# Patient Record
Sex: Male | Born: 1945
Health system: Southern US, Community
[De-identification: ages and names within clinical notes are randomized; demographics above are authoritative.]

## PROBLEM LIST (undated history)

## (undated) DIAGNOSIS — F0390 Unspecified dementia without behavioral disturbance: Secondary | ICD-10-CM

## (undated) DIAGNOSIS — I2510 Atherosclerotic heart disease of native coronary artery without angina pectoris: Secondary | ICD-10-CM

## (undated) DIAGNOSIS — I429 Cardiomyopathy, unspecified: Secondary | ICD-10-CM

## (undated) DIAGNOSIS — K7460 Unspecified cirrhosis of liver: Secondary | ICD-10-CM

## (undated) DIAGNOSIS — K219 Gastro-esophageal reflux disease without esophagitis: Secondary | ICD-10-CM

## (undated) DIAGNOSIS — N184 Chronic kidney disease, stage 4 (severe): Secondary | ICD-10-CM

## (undated) DIAGNOSIS — N189 Chronic kidney disease, unspecified: Secondary | ICD-10-CM

## (undated) DIAGNOSIS — E785 Hyperlipidemia, unspecified: Secondary | ICD-10-CM

## (undated) DIAGNOSIS — Z720 Tobacco use: Secondary | ICD-10-CM

## (undated) HISTORY — DX: Atherosclerotic heart disease of native coronary artery without angina pectoris: I25.10

## (undated) HISTORY — DX: Unspecified cirrhosis of liver: K74.60

## (undated) HISTORY — DX: Gastro-esophageal reflux disease without esophagitis: K21.9

## (undated) HISTORY — DX: Chronic kidney disease, stage 4 (severe): N18.4

## (undated) HISTORY — DX: Unspecified dementia, unspecified severity, without behavioral disturbance, psychotic disturbance, mood disturbance, and anxiety: F03.90

## (undated) HISTORY — DX: Tobacco use: Z72.0

## (undated) HISTORY — DX: Chronic kidney disease, unspecified: N18.9

## (undated) HISTORY — DX: Cardiomyopathy, unspecified: I42.9

---

## 2001-01-15 ENCOUNTER — Inpatient Hospital Stay (HOSPITAL_COMMUNITY): Admission: EM | Admit: 2001-01-15 | Discharge: 2001-01-19 | Payer: Self-pay | Admitting: Emergency Medicine

## 2001-01-15 ENCOUNTER — Encounter: Payer: Self-pay | Admitting: Emergency Medicine

## 2006-01-22 ENCOUNTER — Ambulatory Visit: Payer: Self-pay | Admitting: Cardiology

## 2006-01-22 ENCOUNTER — Encounter: Payer: Self-pay | Admitting: Emergency Medicine

## 2006-01-22 ENCOUNTER — Inpatient Hospital Stay (HOSPITAL_COMMUNITY): Admission: AD | Admit: 2006-01-22 | Discharge: 2006-01-29 | Payer: Self-pay | Admitting: Cardiology

## 2006-01-25 ENCOUNTER — Encounter: Payer: Self-pay | Admitting: Cardiovascular Disease

## 2006-02-14 ENCOUNTER — Ambulatory Visit: Payer: Self-pay | Admitting: Cardiology

## 2006-02-22 ENCOUNTER — Ambulatory Visit: Payer: Self-pay | Admitting: Cardiology

## 2006-03-21 ENCOUNTER — Ambulatory Visit: Payer: Self-pay | Admitting: Cardiology

## 2006-04-01 ENCOUNTER — Ambulatory Visit (HOSPITAL_COMMUNITY): Admission: RE | Admit: 2006-04-01 | Discharge: 2006-04-01 | Payer: Self-pay | Admitting: Cardiology

## 2006-04-01 ENCOUNTER — Ambulatory Visit: Payer: Self-pay | Admitting: Cardiology

## 2006-08-12 ENCOUNTER — Ambulatory Visit: Payer: Self-pay | Admitting: Cardiology

## 2007-03-13 ENCOUNTER — Ambulatory Visit: Payer: Self-pay | Admitting: Cardiology

## 2007-03-13 LAB — CONVERTED CEMR LAB
ALT: 25 units/L (ref 0–53)
AST: 25 units/L (ref 0–37)
Albumin: 4.2 g/dL (ref 3.5–5.2)
Alkaline Phosphatase: 61 units/L (ref 39–117)
BUN: 54 mg/dL — ABNORMAL HIGH (ref 6–23)
Basophils Absolute: 0 10*3/uL (ref 0.0–0.1)
Basophils Relative: 0.7 % (ref 0.0–1.0)
Bilirubin, Direct: 0.1 mg/dL (ref 0.0–0.3)
CO2: 24 meq/L (ref 19–32)
Calcium: 9.5 mg/dL (ref 8.4–10.5)
Chloride: 104 meq/L (ref 96–112)
Creatinine, Ser: 2.5 mg/dL — ABNORMAL HIGH (ref 0.4–1.5)
Eosinophils Absolute: 0.7 10*3/uL — ABNORMAL HIGH (ref 0.0–0.6)
Eosinophils Relative: 9.8 % — ABNORMAL HIGH (ref 0.0–5.0)
GFR calc Af Amer: 34 mL/min
GFR calc non Af Amer: 28 mL/min
Glucose, Bld: 113 mg/dL — ABNORMAL HIGH (ref 70–99)
HCT: 32.6 % — ABNORMAL LOW (ref 39.0–52.0)
Hemoglobin: 11.2 g/dL — ABNORMAL LOW (ref 13.0–17.0)
Lymphocytes Relative: 44.4 % (ref 12.0–46.0)
MCHC: 34.5 g/dL (ref 30.0–36.0)
MCV: 88 fL (ref 78.0–100.0)
Monocytes Absolute: 0.5 10*3/uL (ref 0.2–0.7)
Monocytes Relative: 7.7 % (ref 3.0–11.0)
Neutro Abs: 2.6 10*3/uL (ref 1.4–7.7)
Neutrophils Relative %: 37.4 % — ABNORMAL LOW (ref 43.0–77.0)
Platelets: 172 10*3/uL (ref 150–400)
Potassium: 4.7 meq/L (ref 3.5–5.1)
RBC: 3.7 M/uL — ABNORMAL LOW (ref 4.22–5.81)
RDW: 13.5 % (ref 11.5–14.6)
Sodium: 137 meq/L (ref 135–145)
Total Bilirubin: 0.8 mg/dL (ref 0.3–1.2)
Total Protein: 8.1 g/dL (ref 6.0–8.3)
WBC: 6.9 10*3/uL (ref 4.5–10.5)

## 2007-03-26 ENCOUNTER — Ambulatory Visit: Payer: Self-pay | Admitting: Cardiology

## 2007-04-09 ENCOUNTER — Ambulatory Visit: Payer: Self-pay | Admitting: Cardiovascular Disease

## 2007-07-03 ENCOUNTER — Ambulatory Visit: Payer: Self-pay | Admitting: Cardiovascular Disease

## 2008-05-03 ENCOUNTER — Ambulatory Visit (HOSPITAL_COMMUNITY): Admission: RE | Admit: 2008-05-03 | Discharge: 2008-05-03 | Payer: Self-pay | Admitting: Pulmonary Disease

## 2008-11-10 ENCOUNTER — Ambulatory Visit: Payer: Self-pay | Admitting: Cardiology

## 2008-11-11 ENCOUNTER — Encounter (INDEPENDENT_AMBULATORY_CARE_PROVIDER_SITE_OTHER): Payer: Self-pay | Admitting: *Deleted

## 2008-11-11 ENCOUNTER — Encounter: Payer: Self-pay | Admitting: Cardiology

## 2008-11-11 ENCOUNTER — Ambulatory Visit (HOSPITAL_COMMUNITY): Admission: RE | Admit: 2008-11-11 | Discharge: 2008-11-11 | Payer: Self-pay | Admitting: Cardiology

## 2008-11-11 ENCOUNTER — Ambulatory Visit: Payer: Self-pay | Admitting: Cardiology

## 2008-11-11 LAB — CONVERTED CEMR LAB
ALT: 20 units/L
AST: 17 units/L
Albumin: 4 g/dL
Alkaline Phosphatase: 49 units/L
BUN: 31 mg/dL
CO2: 21 meq/L
Calcium: 9.1 mg/dL
Chloride: 103 meq/L
Cholesterol: 99 mg/dL
Creatinine, Ser: 1.68 mg/dL
Glucose, Bld: 103 mg/dL
HDL: 28 mg/dL
LDL Cholesterol: 55 mg/dL
Potassium: 4.4 meq/L
Sodium: 139 meq/L
Total Protein: 7.2 g/dL
Triglycerides: 81 mg/dL

## 2009-01-25 ENCOUNTER — Encounter (INDEPENDENT_AMBULATORY_CARE_PROVIDER_SITE_OTHER): Payer: Self-pay | Admitting: *Deleted

## 2009-03-25 ENCOUNTER — Encounter: Payer: Self-pay | Admitting: Cardiology

## 2009-07-21 ENCOUNTER — Encounter (INDEPENDENT_AMBULATORY_CARE_PROVIDER_SITE_OTHER): Payer: Self-pay

## 2009-09-14 ENCOUNTER — Encounter (INDEPENDENT_AMBULATORY_CARE_PROVIDER_SITE_OTHER): Payer: Self-pay | Admitting: *Deleted

## 2009-09-14 ENCOUNTER — Ambulatory Visit: Payer: Self-pay | Admitting: Cardiology

## 2010-04-19 ENCOUNTER — Encounter (INDEPENDENT_AMBULATORY_CARE_PROVIDER_SITE_OTHER): Payer: Self-pay | Admitting: *Deleted

## 2010-04-19 LAB — CONVERTED CEMR LAB
AST: 23 units/L
Albumin: 4.1 g/dL
Alkaline Phosphatase: 49 units/L
HDL: 35 mg/dL
Potassium: 4.9 meq/L
Sodium: 136 meq/L
Total Protein: 7.1 g/dL
Triglycerides: 66 mg/dL

## 2010-09-05 ENCOUNTER — Encounter (INDEPENDENT_AMBULATORY_CARE_PROVIDER_SITE_OTHER): Payer: Self-pay | Admitting: *Deleted

## 2010-09-07 ENCOUNTER — Ambulatory Visit: Payer: Self-pay | Admitting: Cardiology

## 2010-09-08 ENCOUNTER — Encounter (INDEPENDENT_AMBULATORY_CARE_PROVIDER_SITE_OTHER): Payer: Self-pay | Admitting: *Deleted

## 2010-10-19 NOTE — Miscellaneous (Signed)
Summary: LABS BMP,LIPIDS,LIVER,PSA,04/19/2010  Clinical Lists Changes  Observations: Added new observation of CALCIUM: 9.2 mg/dL (04/19/2010 10:45) Added new observation of ALBUMIN: 4.1 g/dL (04/19/2010 10:45) Added new observation of PROTEIN, TOT: 7.1 g/dL (04/19/2010 10:45) Added new observation of SGPT (ALT): 25 units/L (04/19/2010 10:45) Added new observation of SGOT (AST): 23 units/L (04/19/2010 10:45) Added new observation of ALK PHOS: 49 units/L (04/19/2010 10:45) Added new observation of BILI DIRECT: 0.1 mg/dL (04/19/2010 10:45) Added new observation of CREATININE: 1.76 mg/dL (04/19/2010 10:45) Added new observation of BUN: 30 mg/dL (04/19/2010 10:45) Added new observation of BG RANDOM: 83 mg/dL (04/19/2010 10:45) Added new observation of CO2 PLSM/SER: 25 meq/L (04/19/2010 10:45) Added new observation of CL SERUM: 102 meq/L (04/19/2010 10:45) Added new observation of K SERUM: 4.9 meq/L (04/19/2010 10:45) Added new observation of NA: 136 meq/L (04/19/2010 10:45) Added new observation of LDL: 49 mg/dL (04/19/2010 10:45) Added new observation of HDL: 35 mg/dL (04/19/2010 10:45) Added new observation of TRIGLYC TOT: 66 mg/dL (04/19/2010 10:45) Added new observation of CHOLESTEROL: 97 mg/dL (04/19/2010 10:45)

## 2010-10-19 NOTE — Letter (Signed)
Summary: Appointment - Missed  [facility] [place]. [address] [place] [address]   Phone: [phone]  Fax: [phone]     September 08, 2010 MRN: [id number]   [address] [place], [place]  [address]   Dear Mr. Siess,  Our records indicate you missed your appointment on    09/07/10                    with Dr.   Lattie Haw    .                                    It is very important that we reach you to reschedule this appointment. We look forward to participating in your health care needs. Please contact us at the number listed above at your earliest convenience to reschedule this appointment.     Sincerely,    Public relations account executive

## 2010-11-07 ENCOUNTER — Ambulatory Visit: Payer: Self-pay | Admitting: Cardiology

## 2010-11-30 ENCOUNTER — Ambulatory Visit (INDEPENDENT_AMBULATORY_CARE_PROVIDER_SITE_OTHER): Payer: Medicare Other | Admitting: Cardiology

## 2010-11-30 ENCOUNTER — Encounter: Payer: Self-pay | Admitting: *Deleted

## 2010-11-30 ENCOUNTER — Encounter: Payer: Self-pay | Admitting: Cardiology

## 2010-11-30 DIAGNOSIS — E782 Mixed hyperlipidemia: Secondary | ICD-10-CM

## 2010-11-30 DIAGNOSIS — I2510 Atherosclerotic heart disease of native coronary artery without angina pectoris: Secondary | ICD-10-CM

## 2010-12-02 ENCOUNTER — Encounter: Payer: Self-pay | Admitting: Cardiology

## 2010-12-02 LAB — CONVERTED CEMR LAB
ALT: 19 units/L (ref 0–53)
AST: 21 units/L (ref 0–37)
Alkaline Phosphatase: 50 units/L (ref 39–117)
Basophils Absolute: 0 10*3/uL (ref 0.0–0.1)
Basophils Relative: 1 % (ref 0–1)
Calcium: 9.2 mg/dL (ref 8.4–10.5)
Chloride: 103 meq/L (ref 96–112)
Creatinine, Ser: 1.7 mg/dL — ABNORMAL HIGH (ref 0.40–1.50)
Hemoglobin: 11.5 g/dL — ABNORMAL LOW (ref 13.0–17.0)
LDL Cholesterol: 52 mg/dL (ref 0–99)
MCHC: 33.4 g/dL (ref 30.0–36.0)
Monocytes Absolute: 0.4 10*3/uL (ref 0.1–1.0)
Neutro Abs: 2.4 10*3/uL (ref 1.7–7.7)
Neutrophils Relative %: 41 % — ABNORMAL LOW (ref 43–77)
RDW: 14.5 % (ref 11.5–15.5)
Total CHOL/HDL Ratio: 3
VLDL: 16 mg/dL (ref 0–40)

## 2010-12-05 NOTE — Letter (Signed)
Summary:  Future Lab Work Doctor, general practice at [place]. [address], [place] [address]   Phone: [phone]  Fax: [phone]     November 30, 2010 MRN: [id number]   [name] [name] [place], [name]  [address]      YOUR LAB WORK IS DUE   January 01, 2011  Please go to [facility], located across the street from [facility] on the second floor.  Hours are Monday - Friday 7am until 7:30pm         Saturday 8am until 12noon      _X_ YOUR LABWORK IS NOT FASTING --YOU MAY EAT PRIOR TO LABWORK

## 2010-12-14 NOTE — Assessment & Plan Note (Signed)
Summary: past due for f/u per pt phone call/tg  Medications Added LISINOPRIL 10 MG TABS (LISINOPRIL) Take one tablet by mouth daily      Allergies Added: NKDA  Visit Type:  Follow-up Primary Provider:  Dr. Sinda Du   History of Present Illness: [name] returns to the office as scheduled for continued assessment and treatment of ischemic cardiomyopathy.  Despite current cigarette consumption of one pack per day his performance status remains enviable.  He does lawn care and walk stairs without any dyspnea or chest discomfort.  He has had no PND, orthopnea, pedal edema or syncope.  Current Medications (verified): 1)  Daily-Vitamin  Tabs (Multiple Vitamin) .... Take 1 Tablet Daily 2)  Protonix 40 Mg Tbec (Pantoprazole Sodium) .[name].. 1 Tab Once Daily 3)  Furosemide 40 Mg Tabs (Furosemide) .... Take 1 Tablet By Mouth Once Daily 4)  Lisinopril 10 Mg Tabs (Lisinopril) .... Take One Tablet By Mouth Daily 5)  Carvedilol 12.5 Mg Tabs (Carvedilol) .... Take One Tablet By Mouth Twice A Day 6)  Aspir-Low 81 Mg Tbec (Aspirin) .... Take 1 Tab Daily 7)  Lipitor 40 Mg Tabs (Atorvastatin Calcium) .... Take 1 Tab Daily 8)  Nitrostat 0.4 Mg Subl (Nitroglycerin) .... Take As Needed For Chest Pain 9)  Tylenol 325 Mg Tabs (Acetaminophen) .... 2 Tabs Q 4hrs 10)  Robitussin Maximum Strength 15 Mg/69ml Syrp (Dextromethorphan Hbr) .... As Needed 11)  Mylanta 200-200-20 Mg/3ml Susp (Alum & Mag Hydroxide-Simeth) .... Take As Needed 12)  Sb Milk of Magnesia 400 Mg/89ml Susp (Magnesium Hydroxide) .... Take As Needed 13)  Imodium A-D 2 Mg Tabs (Loperamide Hcl) .... Take As Needed  Allergies (verified): No Known Drug Allergies  Comments:  Nurse/Medical Assistant: patient brought med list from [facility] [facility] [facility]  Past History:  PMH, FH, and Social History reviewed and updated.  Past Medical History: ASCVD: IMI in 5/07 with CTO mid cx, 60% LAD, 99% RCA->  BMS; mod. impaired LV function; EF:35-40% 2/10 Tobacco abuse-60 pack years; one pack per day Gastroesophageal reflux disease CHRONIC KIDNEY DISEASE STAGE 3-4-creatinine 2.5 in 2009; 1.7 in 10/2008 Cirrhosis with history of excessive alcohol use; continuing social use  Family History: Unavailable  Review of Systems       See history of present illness  Vital Signs:  Patient profile:   65 year old male Height:      67 inches Weight:      147 pounds BMI:     23.11 O2 Sat:      99 % on Room air Pulse rate:   72 / minute BP sitting:   109 / 62  (left arm)  Vitals Entered By: Doretha Sou, CNA (November 30, 2010 1:02 PM)  O2 Flow:  Room air  Physical Exam  General:  Thin and well developed; no acute distress:   Neck-No JVD; no carotid bruits Thorax-mild gynecomastia Lungs-clear to auscultation; mild prolongation of the expiratory phase Cardiovascular-normal PMI; normal S1 and S2; S4 present Abdomen-BS normal; soft and non-tender without masses or organomegaly:  Musculoskeletal-No deformities, no cyanosis or clubbing: Neurologic-Normal cranial nerves; symmetric strength and tone:  Skin-Warm; tinea versicolor over back; vitiligo over base of neck Extremities- no edema; distal pulses intact     Impression & Recommendations:  Problem # 1:  CHRONIC KIDNEY DISEASE STAGE III (MODERATE) (ICD-585.3) Renal function has actually improved in recent years; a repeat metabolic profile will be obtained.  Problem # 2:  ATHEROSCLEROTIC CARDIOVASCULAR DISEASE (ICD-429.2) Patient  is entirely asymptomatic.  The desirability of discontinuing tobacco use was explained to him, but he is not inclined to consider this option.  We will attempt to optimize other cardiovascular risk factors.  Lipid status was excellent when the assessed 8 months ago.  A repeat lipid profile will be obtained.  Ischemic cardiomyopathy is present without any evidence for congestive heart failure.  His dose of lisinopril  will be increased to 10 mg q.d.  Renal function will be monitored.  Other Orders: Future Orders: T-Lipid Profile [id number]) ... 12/01/2010 T-Comprehensive Metabolic Panel ([id number]) ... 12/01/2010 T-CBC w/Diff [id number]) ... [id number] T-Basic Metabolic Panel ([id number]) ... 01/01/2011  Patient Instructions: 1)  Your physician recommends that you schedule a follow-up appointment in: 1 year 2)  Your physician recommends that you return for lab work in: tomorrow 3)  Your physician has recommended you make the following change in your medication: increase lisinopril to 10mg  dialy Prescriptions: LISINOPRIL 10 MG TABS (LISINOPRIL) Take one tablet by mouth daily  #30 x 3   Entered by:   Doretha Sou, CNA   Authorized by:   Yehuda Savannah, MD, [facility]   Signed by:   Doretha Sou, CNA on 11/30/2010   Method used:   Electronically to        [facility] (retail)       [address]       [place], [place]  [address]       Ph: [phone]       Fax: [phone]   [id number]

## 2011-01-19 ENCOUNTER — Other Ambulatory Visit: Payer: Self-pay | Admitting: Cardiology

## 2011-01-30 NOTE — Assessment & Plan Note (Signed)
[facility] CARDIOLOGY OFFICE NOTE   NAME:Rothrock, SLY MEDOR                   MRN:          [id number]  DATE:07/03/2007                            DOB:          [birth year]    Nicole returns today for follow up.   He has had a previous inferior MI with bare metal stent to the RCA. He  has had residual disease in the LAD and circumflex. He is currently not  having significant chest pain.   He will need a follow up Myoview in January. His risk factors are fairly  well modified. He is taken care at [facility] and they dispense  his medicines. He has been compliant with this. He has not having  significant chest pain, PND, or orthopnea. There are no palpitations or  syncope.   His risk factors include hypertension and hyperlipidemia. He is a non-  diabetic and a non-smoker.   CURRENT MEDICATIONS:  1. Lisinopril 2.5 daily.  2. Coreg 3.125 b.i.d.  3. Protonix 40 daily.  4. Aspirin daily.  5. Lipitor 40 daily.  6. Plavix 75 daily.  7. Lasix 40 daily.  8. Imdur 30 daily.   PHYSICAL EXAMINATION:  GENERAL:  Remarkable for a somewhat disheveled  elderly black male in no distress. Affect is appropriate.  VITAL SIGNS:  Weight 154, blood pressure 130/70, pulse 82 and regular.  HEENT:  Unremarkable.  NECK:  Carotids are normal without bruit. There is no lymphadenopathy or  thyromegaly. No JVP elevation.  LUNGS:  Clear with good diaphragmatic motion and no wheezing.  CARDIAC:  S1, S2 with normal heart sounds. PMI is normal.  ABDOMEN:  Benign. Bowel sounds are positive. No AAA. No tenderness. No  hepatosplenomegaly or hepato jugular reflux.  EXTREMITIES:  Distal pulses are intact with no edema.  NEUROLOGIC:  Non-focal. There is no muscular weakness.   His electrocardiogram shows sinus rhythm with insignificant Q-waves in 3  and F. It is essentially normal.   IMPRESSION:  1. Stable bare metal stenting of the  right coronary artery, no angina.      Follow up Myoview in January, particularly given his moderate left      sided disease. He has nitroglycerin at [facility] if he      needs it.  2. Hypertension, currently well controlled. Consider increasing      Lisinopril in the future. He is on a low sodium at [facility].  3. Hyperlipidemia. Check lipid and liver profile in six months.      Continue Lipitor 40 mg daily. Target goal of less than 80 given his      known coronary artery disease.  4. Reflux. Continue Protonix 40 mg daily. Low spice diet. Avoid late      night meals. He has been having an occasional indigestion, but I      think that this is diet related.   Overall, I think that Treaver is doing well and I will see him back when  he has his Myoview in January.     [name]. Johnsie Cancel, MD, [facility]  Electronically Signed    PCN/MedQ  DD: 07/03/2007  DT: 07/04/2007  Job #: [id number]

## 2011-01-30 NOTE — Assessment & Plan Note (Signed)
[facility] OFFICE NOTE   NAME:[name], [name]                   MRN:          [id number]  DATE:03/13/2007                            DOB:          [birth year]    [name] is seen today back in followup.  He is a patient of Dr.  Christy Sartorius, he is followed by Dr. Luan Pulling.  He presented with an inferior  myocardial infarction with cardiogenic shock in May of 2007.  He has a  reduced overall ejection fraction, he has a total occlusion of the  obtuse marginal which is fed via both left-to-right and right-to-left  collaterals.  He has remained on a medical regimen.  This has included  Coreg, Lisinopril, hydralazine, Imdur.  He was noted at the [facility] yesterday to have hypotension.  Changes in medicines  were then ordered by Dr. Caryl Comes, and a followup in this office suggested.  Dr. Albertine Patricia has now departed the practice.  Dr. Caryl Comes decreased his  hydralazine to 3 times a day, his Lisinopril to 2.5 once a day, and his  Coreg to 3.125 b.i.d.  Imdur and Lasix were continued at the current  doses.  He is feeling somewhat better.  The only symptoms that the  patient really has had is that he did have some burping and belching and  did not feel well and was weak.  His blood pressure was lower.   Today the patient looks very well.  He is not feeling poorly in any way.   MEDICATIONS:  1. Plavix 75 mg daily.  2. Enteric-coated aspirin 325 daily.  3. Imdur 30 daily.  4. Lipitor 40 daily.  5. Protonix 40 daily.  6. Multivitamin 1 daily.  7. K-Dur 20 daily.  8. Lasix 40 mg daily.  9. Hydralazine 20 mg 2 tablets t.i.d.  10.Lisinopril 2.5 daily.  11.Coreg 3.125 b.i.d.   PHYSICAL EXAMINATION:  The blood pressure is 102/62 and the pulse is 64.  The lung fields are clear.  There is an S4 gallop.  No extremity edema is noted.   The electrocardiogram demonstrates normal sinus rhythm essentially  within  normal limits without any ST segment changes.  I cannot even  appreciate evidence of inferior Q-waves.  Importantly, when we compared  to the previous EKGs, the only finding is that the nonspecific T-wave  abnormalities are slightly improved.   IMPRESSION:  1. Coronary artery disease with prior ischemic cardiomyopathy.  Of      note, the patient did not show up for a previously scheduled      echocardiogram.  2. Chronic renal insufficiency with a total occlusion of the right      renal artery.  3. History of glucose intolerance.  4. Gastroesophageal reflux.  5. Multiple abnormalities as noted in the previous chart.   PLAN:  1. Return to clinic in 1 week.  2. Check CBC, basic metabolic profile.  3. Discontinue hydralazine.  4. Reassess symptoms.  While he might need repeat catheterization, his      1 episode of symptoms was atypical.  His blood  pressure was low.      His current EKG is entirely normal, and his laboratory studies are      pending.  We will see him back in followup in 1 week in the [facility]      cl[facility] and reassess his status at that time.     Loretha Brasil. Lia Foyer, MD, [facility]  Electronically Signed    TDS/MedQ  DD: 03/13/2007  DT: 03/13/2007  Job #: [id number]

## 2011-01-30 NOTE — Assessment & Plan Note (Signed)
[facility] CARDIOLOGY OFFICE NOTE   NAME:[name] [name] DEE                   MRN:          [id number]  DATE:04/09/2007                            DOB:          10-31-1945    [name] [name] was seen today in the [facility] for the first time  by me.  He has previously been seen by Dr. Albertine Patricia.  His last office  visit was July 9 with Dr. Lia Foyer and Ermalinda Barrios.   The patient is a 65 year old African-American.  He has a history of an  inferior wall MI with shock.  The patient had catheterization on Jan 22, 2006.  He required an entry aortic balloon pump. He had  revascularization of the RCA using a bare metal stent.  He had  significant congestive heart failure.   Reading through the note, he had a 60% stenosis in the mid circ and 60%  proximal stenosis in the LAD.   The patient had some hypotension and had his medications adjusted.   He had previously been on hydralazine, lisinopril, Coreg an Imdur.  These dosages were decreased and as far as I can tell, he is currently  taking lisinopril and Coreg with an aspirin at this time.   The patient's history is given by his caretaker at the assisted living.  Primarily the patient is able to converse, but is not very knowledgeable  about his cardiac condition.  He has a significant history of alcoholism  and question distant history of syphilis and obviously has some  dementia.  However, he denies any significant chest pain, PND or  orthopnea.  He is not having any significant exertional dyspnea.  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  The patient takes the medications that are provided to him at  the assisted living place.   REVIEW OF SYSTEMS:  His review of systems otherwise negative.   MEDICATIONS:  As indicated, would appear to be:  1.  Lisinopril 2.5 daily.  2. Coreg 3.125 b.i.d.  3. Imdur 15 daily.  4. Hydralazine has been lowered to I believe 25 q. 8.   It is not clear to me what dose of diuretics he might be on.   The patient does have a history of some renal insufficiency with  creatinine in the 2.5 range.   PHYSICAL EXAMINATION:  GENERAL:  Remarkable for a somewhat disheveled  middle-aged black male in no distress.  Affect is appropriate, although  he is a bit confused.  VITAL SIGNS:  Weight 151, blood pressure 126/72, pulse 80 and regular.  Respiratory rate is 14.  He is afebrile.  HEENT:  Normal.  NECK:  Carotids without bruits.  There is no JVP elevation, no  lymphadenopathy, no thyromegaly.  LUNGS:  Clear with good diaphragmatic motion, no wheezing.  HEART:  There is an S1, S2 with a normal PMI, normal heart sounds.  ABDOMEN:  Benign.  Bowel sounds are positive. No AAA, no tenderness, no  hepatosplenomegaly,  hepatojugular reflex.  EXTREMITIES:  Femorals are +3 bilaterally without bruit.  Distal pulses  are intact.  No edema.  NEUROLOGICAL:  Nonfocal.  There is no muscular weakness.   His baseline EKG shows sinus rhythm with previous inferior wall MI.   IMPRESSION:  1. Coronary artery disease, inferior wall myocardial infarction with      bare metal stenting.  Continue an aspirin a day.  He should have a      follow up functional study in 6 months or so, given his residual      disease and the circ and the left anterior descending.  2. Blood pressure fluctuations.  The patient had previously been      hypertensive, but apparently his blood pressure got low and his      medications were adjusted down.  We will have to talk to the [facility] to see exactly what medications he is on and in      particular what dose of diuretics, since he does have some renal      insufficiency.  He should probably have a followup BMET and a BNP      in 3 months.  I will leave this up to Dr. Luan Pulling to  check.  3. Dementia.  Significant, but currently stable.  I am not sure if      this is related to previous syphilis.  The patient is not on      Aricept and this may be beneficial in the future.  He seems      functional.  4. The patient is not on any statin drug that I can see.  It could be      important to check his fasting lipid and liver profile given his      coronary artery disease and possibly start him on 40 of Lipitor.  5. Overall I think the patient is doing well and I will see him back      in 6 months.     [name]. Johnsie Cancel, MD, [facility]  Electronically Signed    PCN/MedQ  DD: 04/09/2007  DT: 04/10/2007  Job #: [id number]

## 2011-01-30 NOTE — Letter (Signed)
November 10, 2008    Edward L. Luan Pulling, MD  [address]  [place], [place] [address]   RE:  Alexander, [name]  MRN:  [id number]  /  DOB:  02-11-1946   Dear Jaquita Rector,   [name] returns to the office for continued assessment and  treatment of coronary disease, now nearly 3 years following acute  inferior myocardial infarction.  He was previously followed by Dr.  Johnsie Cancel, but has now been transferred to my practice.  He appears to have  dementia, perhaps Korsakoff; as a result, the accuracy of his history is  questionable.  He denies chest pain or dyspnea.  He resides at a local  rest home and is apparently taking his medications as prescribed.  Unfortunately, he continues to smoke cigarettes.  He has had chronic  kidney disease with creatinine last measured at 2.1 last year.   CURRENT MEDICATIONS:  1. Lisinopril 2.5 mg daily.  2. Carvedilol 3.125 mg b.i.d.  3. Protonix 40 mg daily.  4. Aspirin 325 mg daily.  5. Atorvastatin 40 mg daily.  6. Clopidogrel 75 mg daily.  7. Furosemide 40 mg daily.  8. Isosorbide mononitrate 30 mg daily.  9. Multivitamin.   PHYSICAL EXAMINATION:  GENERAL:  Very pleasant, trim gentleman in no  acute distress.  VITAL SIGNS:  The weight is 151, 3 pounds less than in 2008 at the time  of his most recent office visit, blood pressure 105/60, heart rate 70  and regular, respirations 14 and unlabored.  NECK:  No jugular venous distention; no carotid bruits.  LUNGS:  Clear with decreased breath sounds at the bases.  CARDIAC:  Normal first and second heart sounds; fourth heart sound  present.  ABDOMEN:  Soft and nontender; no organomegaly.  EXTREMITIES:  No edema; distal pulses intact.   IMPRESSION:  [name] appears to be doing well from a cardiac  standpoint.  Left ventricular systolic function was markedly impaired  immediately after his myocardial infarction.  We will reassess this with  an echocardiogram.  He is on suboptimal doses of ACE inhibitor  and beta-  blocker.  These will be titrated up as blood pressure permits.  His dose  of aspirin will be reduced to 81 mg daily.  Clopidogrel and isosorbide  mononitrate will be discontinued.  I will reassess this nice gentleman  in 3 months after a lipid profile has been obtained.    Sincerely,      Cristopher Estimable. Lattie Haw, MD, [facility]  Electronically Signed    RMR/MedQ  DD: 11/10/2008  DT: 11/11/2008  Job #: [id number]

## 2011-01-30 NOTE — Assessment & Plan Note (Signed)
[facility]                            CARDIOLOGY OFFICE NOTE   NAME:Sliwinski, YAVIN LUKOWSKI                   MRN:          [id number]  DATE:03/26/2007                            DOB:          Jun 12, 1946    PRIMARY CARE PHYSICIAN:  Dr. Luan Pulling.   HISTORY OF PRESENT ILLNESS:  Mr. Reisman is a very pleasant 65-year-  old African-American male patient seen by Dr. Lia Foyer on March 13, 2007  for followup of some hypotension and some burping and belching. Dr.  Lia Foyer did not feel this episode was ischemic related but wanted him  followed up. Dr. Caryl Comes had stopped his hydralazine and decreased his  Lisinopril and Coreg. The patient is feeling much better. He has had no  further dizziness, hypertension. He denies any chest pain, palpitations,  dyspnea, or dyspnea on exertion. He has not had any more burping,  belching, and weakness. He is actually doing quite well.   CURRENT MEDICATIONS:  Lisinopril 2.5 mg daily, Coreg 3.125 mg b.i.d.,  Plavix 75 mg daily, aspirin 325 daily, Imdur 30 mg daily, Lipitor 40 mg  daily, Protonix 40 mg daily, multivitamin daily, K-Dur 20 meq daily,  Lasix 40 mg daily, Clobetasol b.i.d.   PHYSICAL EXAMINATION:  GENERAL:  This is a pleasant, elderly looking 69-  year-old African-American male in no acute distress.  VITAL SIGNS:  Blood pressure 118/68, pulse 75, weight 149.  NECK:  Without JVD, HJR, bruit, or thyroid enlargement.  LUNGS:  Clear anterior, posterior, and lateral.  HEART:  Regular rate and rhythm at 70 beats per minute. Normal S1 and  S2. Positive S4. No murmur, rub, thrill, or heave noted.  ABDOMEN:  Soft without organomegaly, masses, lesions, or abnormal  tenderness.  EXTREMITIES:  Without clubbing, cyanosis, or edema. He has good distal  pulses.   IMPRESSION:  1. Hypotension, resolved.  2. Coronary artery disease with prior ischemic cardiomyopathy, status      post acute inferior wall myocardial infarction with  cardiogenic      shock in May of 2007, status post [place] metal stent to the right      coronary artery, chronic total occlusion of the circumflex, and 60%      proximal left anterior descending.  3. Chronic renal insufficiency with totally occluded right renal      artery.  4. Gastroesophageal reflux disease.  5. History of cirrhosis.  6. History of tobacco use.   PLAN:  At this time, the patient is stable from a cardiac standpoint. We  have not had a followup echocardiogram on him, as he had not shown up  for this. He did have labs when he saw Dr.  Lia Foyer and the creatinine was 2.5, BUN 54. These will need to be  repeated when he sees Dr. Johnsie Cancel in [place] on the 23rd and can be  followed there.      Ermalinda Barrios, PA-C  Electronically Signed      Satira Sark, MD  Electronically Signed   ML/MedQ  DD: 03/26/2007  DT: 03/26/2007  Job #: [id number]   cc:   [name]. Johnsie Cancel, MD,  FACC

## 2011-02-02 NOTE — H&P (Signed)
[name], [name]            ACCOUNT NO.:  [id number]   MEDICAL RECORD NO.:  [id number]          PATIENT TYPE:  AMB   LOCATION:  SDS                          FACILITY:  [place]   PHYSICIAN:  Ethelle Lyon, M.D. LHCDATE OF BIRTH:  1946/06/05   DATE OF ADMISSION:  01/22/2006  DATE OF DISCHARGE:                                HISTORY & PHYSICAL   PRIMARY CARE PHYSICIAN:  Unknown.   CARDIOLOGIST:  He will be new to Dr. Albertine [name].   CHIEF COMPLAINT:  The patient is transferred from [facility] for  acute inferior MI.   HISTORY OF PRESENT ILLNESS:  [name] is a very pleasant 65 year old  male patient with no known history of coronary disease with a reported  history of cirrhosis and altered mental status, who is a resident of a  nursing home in [place], who presented to [facility] today with  complaints of chest pressure and shortness of breath.  The pain occurred at  rest.  He was transported by EMS.  Apparently, he had two syncopal episodes  but was not on a monitor.  There is no rhythm strip available.  Upon  admission to [facility] emergency room, he was noted to be in sinus  tachycardia.  He developed a complete heart block and then developed ST  elevation in leads II, III, and aVF.  He was subsequently transferred to  [facility].  In the catheterization lab, he was still complaining  of chest pressure.  He was tachycardic on the monitor with heart rates in  the 130s.  He was hypertensive with blood pressure of 156/115.  He is quite  short of breath with lying flat.   PAST MEDICAL HISTORY:  As noted above.  His history is limited.  By report,  there is a history of alcoholic cirrhosis as well as gastroesophageal reflux  disease.  He denies diabetes, hypertension, or hypercholesterolemia.  There  is also a history of altered mental status on the records.   MEDICATIONS:  1.  Protonix 40 mg a day.  2.  Clindagel facial gel 1%  p.r.n.  3.  Clobetasol 0.5% apply to the scalp p.r.n.   ALLERGIES:  No known drug allergies.   SOCIAL HISTORY:  The patient lives in [place].  He continues to smoke  cigarettes and has done so for many years.  He has a history of alcoholism  but has not had any alcohol in a couple of years now.   FAMILY HISTORY:  Noncontributory at this point.   REVIEW OF SYSTEMS:  Please see HPI.  Denies any recent fevers, chills,  cough, melena, hematochezia, hematuria, dysuria, numbness, tingling, rashes.  Please see HPI.  The rest of the review of systems are negative.   PHYSICAL EXAMINATION:  VITAL SIGNS:  Blood pressure 140/106, pulse 131,  temperature 97.1.  GENERAL:  He is a well-developed and well-nourished male who is acutely  short of breath and in obvious pain.  HEENT:  Head is normocephalic and atraumatic.  NECK:  Without lymphadenopathy.  Positive JVD all the way up to his jawline.  No lymphadenopathy.  Carotids without bruits bilaterally.  LUNGS:  Bilateral rales all the way up the chest.  CARDIAC:  Normal S1 and S2.  Rapidly regular rhythm.  ABDOMEN:  Soft and nontender with normoactive bowel sounds.  EXTREMITIES:  Without clubbing, cyanosis or edema.  VASCULAR:  Dorsalis pedis and posterior tibial pulses 2+ bilaterally.  MUSCULOSKELETAL:  Without deformity.  NEUROLOGIC:  Cranial nerves II-XII are grossly intact, nonfocal.   C[facility] from [facility].  Report is pending.   EKG:  Please see the HPI.   LABORATORY DATA:  Sodium 133, potassium 2.7, BUN 17, creatinine 2, glucose  163.  Hemoglobin 14.5, white count 7000, platelet count 211,000.  LFTs  normal.  INR 1.1.  Initial point-of-care markers, CK-MB 7.8, troponin I  0.21.   IMPRESSION:  1.  Acute ST elevation myocardial infarction.  2.  Congestive heart failure secondary to #1.  3.  Transient complete heart block secondary to #1.  4.  History of cirrhosis.  5.  Renal insufficiency.  6.  Hypokalemia.  7.  Tobacco  use.  8.  Gastroesophageal reflux disease.  9.  Questionable history of mental status changes.   PLAN:  The patient is currently in the catheterization lab.  He will undergo  emergent catheterization by Dr. Albertine [name].  Risks and benefits have been  explained.  He agrees to proceed.  His potassium will be repleted.  He will  be placed on diuresis for his congestive heart failure.      [name], P.A.      Ethelle Lyon, M.D. [facility]  Electronically Signed    SW/MEDQ  D:  01/22/2006  T:  01/22/2006  Job:  [id number]

## 2011-02-02 NOTE — Discharge Summary (Signed)
[name], [name]            ACCOUNT NO.:  [id number]   MEDICAL RECORD NO.:  [id number]          PATIENT TYPE:  INP   LOCATION:  2041                         FACILITY:  [place]   PHYSICIAN:  Rosaria Ferries, P.A. LHCDATE OF BIRTH:  1945-11-09   DATE OF ADMISSION:  01/22/2006  DATE OF DISCHARGE:  01/29/2006                                 DISCHARGE SUMMARY   TIME AT DISCHARGE:  48 minutes.   PROCEDURES:  1.  Left heart cardiac catheterization.  2.  Placement of intraaortic balloon pump.  3.  Coronary angiography.  4.  Right heart catheterization.  5.  Bare metal stent to the proximal RCA.  6.  2-D echocardiogram.   PRIMARY DIAGNOSES:  1.  Inferior myocardial infarction with bare metal stent to the right      coronary artery.  2.  Ischemic cardiomyopathy with an ejection fraction of 20-25% by      echocardiogram.  3.  Acute on chronic renal insufficiency.  4.  Hyponatremia.  5.  Thrombocytopenia.  6.  Cardiogenic shock.  7.  Pulmonary edema/congestive heart failure.  8.  History of cirrhosis.  9.  Altered mental status.  10. Gastroesophageal reflux disease.  11. Hyperglycemia with a hemoglobin A1C of 6.1.  12. Remote history of alcohol abuse.  13. Transient heart block secondary to myocardial infarction.  14. Tobacco use.   HOSPITAL COURSE:  [name] is a 65 year old male with no previous  history of coronary artery disease.  He lives in an assisted-living facility  in [place].  He developed chest pain on the day of admission and went to  [facility] by EMS.  He had transient complete heart block and ST  elevation in the inferior leads.  He was transferred urgently to [facility] for further evaluation and treatment.  1.  Acute inferior MI:  In the cath lab, [name] had a 99% RCA with      TIMI 1 flow that was treated with PTCA and a bare metal stent producing      the stenosis to 0 with TIMI 3 flow.  Residual coronary artery disease in      the distal RCA of 80%, in the circumflex 60%, in the LAD 60%, and an OM      that was totaled is felt best managed medically.  He had left-to-left      and right-to-left collaterals to the distal OM territory.  He was seen      by cardiac rehab and prior to discharge was ambulating without chest      pain or shortness of breath.  He had Imdur added to his medication      regimen as well as aspirin and Plavix.  2.  Cardiogenic shock:  [name] became hypotensive during the cath and      required dopamine as well as an intraaortic balloon pump.  His      intraaortic balloon pump was removed on Jan 24, 2006.  He had volume      overload and was continued on intravenous Lasix until discharge.  At  discharge, he was felt to be euvolemic.  3.  Anemia:  [name] had a hemoglobin of 13.5 with hematocrit of 39.9      at discharge.  He had some oozing from the balloon pump site and the      sheath site in his groins and his hemoglobin dropped to 9.3 with      hematocrit of 26.8.  However, once the sheaths were discontinued, he was      trending back up with a hemoglobin of 10.1 and hematocrit 29.4.  This is      to be followed as an outpatient.  4.  Peripheral vascular disease:  [name] had an abdominal aortogram      performed at the time of the cath secondary to a creatinine of 1.8.  His      right renal artery was totaled.  He will not be started on ACE      inhibitor.  5.  Renal insufficiency:  [name] BUN and creatinine were elevated      on admission at 18.1/0.8.  Because of the congestive heart failure, he      required diuresis and his BUN and creatinine peaked at 27/2.5.  His      Lasix dose was decreased and he is to continue on Lasix at 40 mg a day.      His BUN and creatinine at discharge are 36/2.3.  This is to be followed      closely.  6.  Dyslipidemia:  His total cholesterol was 138, triglycerides 61, HDL 25,      LDL 101.  He had abnormal LFT on  admission and a history of cirrhosis.      His AST was 374 with ALT of 52.  Total bilirubin was within normal      limits.  This can be reassessed as an outpatient, and he may have a      Statin started once his LFT improve.  7.  Hyperglycemia:  [name] has no history of diabetes.  His blood      sugars were elevated during this admission up to 182.  However,      hemoglobin A1C was within normal limits at 6.1.  It is recommended that      he follow a heart-healthy diet and limit processed sugars.  8.  Electrolyte imbalances:  [name] had hyponatremia which was      treated with fluid restrictions and only using normal saline.  It      improved and by Jan 28, 2006, was 134.  He also had hypokalemia and this      was supplemented.  He is to go home on potassium 20 mEq a day and follow      up as an outpatient.  9.  Thrombocytopenia:  [name] had a normal platelet count on      admission at 192,000.  However, with sheaths in both groins and the      intraaortic balloon pump, his platelets decreased to 88,000.  Once the      sheaths were discontinued, his platelets improved and were 141,000 prior      to discharge.  A hepatitis panel was drawn and was negative.  10. Hypertension:  [name] needs very tight control of his blood      pressure.  With ischemic cardiomyopathy and an EF of 20-25%, it is      recommended that his systolic blood pressure be kept  on the low side of      normal.  He was started on hydralazine 20 mg every 6 hours as well as      Lasix and Coreg 3.125 mg twice a day.  He will also be taking Imdur 30      mg a day.  His systolic blood pressure is well-controlled on these      medications.   [name] was seen by cardiac rehab and was ambulating without chest  pain or shortness of breath.  He was in an assisted-living facility prior to  admission and case managers are facilitating the transfer.  He was ambulating without chest pain or shortness  of breath and considered stable  for discharge on Jan 29, 2006, with outpatient follow-up arranged.   DISCHARGE INSTRUCTIONS:  1.  His activity is to be increased gradually.  2.  He is to call our office for any problems with the cath site.  3.  He is to stick to a low-fat and 4000 mg sodium diet, and he is to limit      processed sugars.  4.  He is to follow up with cardiac rehab.  5.  He is to follow up with Dr. Christy Sartorius physician extender on Feb 14, 2006,      at 9:30.  6.  He is to get a BMET next week.   DISCHARGE MEDICATIONS:  1.  Aspirin 325 mg daily.  2.  Plavix 75 mg daily.  3.  Protonix 40 mg daily.  4.  Multivitamin daily.  5.  Potassium 20 mEq daily.  6.  Hydralazine 20 mg four times a day.  7.  Lipitor 40 mg daily.  8.  Lasix 40 mg daily.  9.  Coreg 3.125 mg twice a day.  10. Imdur 30 mg a day.  11. Sublingual nitroglycerin p.r.n.      Rosaria Ferries, P.A. [facility]     RB/MEDQ  D:  01/29/2006  T:  01/29/2006  Job:  [id number]   cc:   Percell Miller L. Luan Pulling, M.D.  Fax: [phone]

## 2011-02-02 NOTE — Cardiovascular Report (Signed)
[name], [name]            ACCOUNT NO.:  [id number]   MEDICAL RECORD NO.:  [id number]          PATIENT TYPE:  INP   LOCATION:  2908                         FACILITY:  [place]   PHYSICIAN:  Ethelle Lyon, M.D. LHCDATE OF BIRTH:  27-Oct-1945   DATE OF PROCEDURE:  01/22/2006  DATE OF DISCHARGE:                              CARDIAC CATHETERIZATION   PROCEDURE:  Left heart catheterization, placement of intra-aortic balloon  pump, coronary angiography, right heart catheterization, bare metal stenting  of proximal right coronary artery.   INDICATIONS:  [name] is a 65 year old gentleman who presents with an  acute inferior myocardial infarction complicated by congestive heart  failure.  There is no antecedent history of coronary disease.  The the  patient lives in an assisted living facility apparently due to a history of  alcohol abuse.   PROCEDURAL TECHNIQUE:  Informed consent was obtained.  Underwent some  lidocaine local anesthesia. A 6-French sheath was placed in the right common  femoral artery and a 7-French sheath in the right common femoral vein using  the modified Seldinger technique.  Left heart catheterization was performed  using a JR-4 catheter.  Coronary angiography was performed using JL-4 and JR-  4 catheters.  This demonstrated with appeared to be a chronic total  occlusion of the mid circumflex, a 60% stenosis of the proximal LAD, and a  99% stenosis of the proximal RCA.  It appeared that the RCA was the culprit  lesion.  The patient was in severe congestive heart failure upon  presentation.  Throughout the diagnostic portion of the case, he became  increasingly tachypneic.  We administered Lasix. Left ventricular end-  diastolic pressure was 42 at that point.  I, therefore, decided to proceed  with placement in intra-aortic balloon pump prior to proceeding with  revascularization.   I then advanced a pigtail catheter to the aortic arch.  Aortography  was  performed by power injection.  This demonstrated occlusion of the right  renal artery.  The remainder of the aorta remains free of significant  disease.  There was very sluggish flow through the aorta, consistent with  very low cardiac output.  I then placed an intra-aortic balloon pump via the  left common femoral artery using the modified Seldinger technique.  Counter  pulsation was initiated at 1:1.   Anticoagulation was initiated with additional heparin and double bolus  eptifibatide.  CHAMPION study drugs were administered.  A 6-French JR-4  guide with side holes was advanced over wire and engaged in the ostium of  the RCA.  I was unable to advance a Prowater wire beyond the stenosis.  I  then changed for a Whisper wire which I was able to manipulate beyond the  stenosis into the distal vessel.  I then advanced a 2.5 x 20 mm Maverick  over the wire balloon beyond the stenosis and used it to exchange for a 300  cm Prowater wire.  I then pulled the balloon back to the site of 99%  stenosis and performed predilation at 6 atmospheres.  With this, TIMI-3 flow  was established.  Throughout this  portion of the case, the patient's blood  pressure continued to decline despite the support of intra-aortic balloon  pump.  Dopamine was administered to maintain mean arterial pressure of  greater than 65 mmHg.  I then proceeded to stent the acute lesion using a  2.5 x 28 mm mini Vision deployed at 14 atmospheres.  The entirety of the  stent was then post dilated using a 2.5 x 18 mm PowerSail at 16 atmospheres.  Right heart catheterization was then performed via the venous sheath using a  balloon-tipped catheter.  Cardiac output was measured using thermodilution  technique.   Final angiography demonstrated no residual stenosis, no dissection, and TIMI-  3 flow to the distal vasculature.  Collaterals from the distal RCA to the  distal circumflex were much more robust of the completion of the  procedure  than they were prior previously.  At completion of the procedure, the  patient's blood pressure had improved, allowing discontinuation of the  dopamine.  His respiratory distress had improved.  He was then transferred  to the cardiac intensive care unit in stable condition.   TIMES:  Pain onset:  Patient not clear on this. [facility] arrival  1640, [place] catheterization lab arrival [phone], reperfusion 1929.   FINDINGS:  1.  Hemodynamics (obtained after reperfusion) RA 15/14/11, RV 44/13/15, PA      46/32/38, PCW 32/33/30, LV 169/16/45 grams.  Cardiac output/index      2.3/1.4 with a systemic vascular resistance of 3652.  2.  Left main:  Very short vessel.  It is angiographically normal.  3.  LAD:  Large vessel giving rise to a single large branching diagonal.      There is a 60% stenosis of the proximal LAD.  4.  Circumflex:  Large vessel giving rise to at least three marginals.      There is a long 60% stenosis extending from the mid circumflex into the      second marginal.  The AV groove circumflex is occluded after the takeoff      of the second marginal.  The distal circumflex is collateralized both      from the left and from two sources in the right coronary circulation.  5.  RCA:  Moderate-sized dominant vessel.  There was a 99% stenosis      proximally, reduced to 0% using a bare metal stent.  Flow improved from      TIMI-1 to TIMI-3.  The mid and distal vessel has diffuse 80% stenosis.  6.  Abdominal aorta:  No aneurysm or significant atherosclerotic plaquing      within the aorta itself.  7.  Renal arteries:  Single vessels bilaterally.  The left is normal.  The      right is totally occluded.   IMPRESSION AND PLAN:  Successful revascularization of the culprit lesion in  the right coronary artery using a bare metal stent.  He remains in substantial congestive heart failure.  Will continue intra-aortic balloon  pump support.  Will not administer beta  blocker for the time being due to  his congestive heart failure.  Will withhold ACE inhibitor due to his renal  insufficiency and tenuous blood pressure.  Will continue aspirin and Plavix.  Will avoid starting statin until we can clarify the history of cirrhosis  that he gives.  Will plan medical therapy for the chronic total occlusion of  the circumflex.   He remains critically ill.  I am quite concerned with  his renal function.      Ethelle Lyon, M.D. [facility]  Electronically Signed     WED/MEDQ  D:  01/23/2006  T:  01/24/2006  Job:  [id number]

## 2011-02-02 NOTE — Procedures (Signed)
NAMEDERYK, PFOST            ACCOUNT NO.:  [id number]   MEDICAL RECORD NO.:  [id number]          PATIENT TYPE:  OUT   LOCATION:  RAD                           FACILITY:  [facility]   PHYSICIAN:  Jacqulyn Ducking, M.D. [facility] OF BIRTH:  [birth year]   DATE OF PROCEDURE:  04/01/2006  DATE OF DISCHARGE:                                  ECHOCARDIOGRAM   REFERRING PHYSICIAN:  Percell Miller L. Luan Pulling, M.D./William Cordella Register, M.D.   CLINICAL DATA:  A 65 year old gentleman with hypertension and congestive  heart failure.   M-MODE TRACINGS:  M-mode aorta 3.3, left atrium 2.9, septum 1.2, posterior  wall 1.0, LV diastole 4.8, LV systole 2.7.   RESULTS:  1.  Technically adequate echocardiographic study.  2.  Very mild left atrial enlargement; normal right atrium and right      ventricle.  3.  Very mild aortic valvular sclerosis and calcification of the proximal      ascending aorta.  4.  Very mild mitral valve thickening.  5.  Normal tricuspid valve with physiologic regurgitation.  Estimated RV      systolic pressure is normal.  6.  Normal pulmonic valve and proximal pulmonary artery.  7.  Left ventricular size at the upper limit of normal with borderline      hypertrophy.  Global hypokinesis is present, most severe inferiorly and      posteriorly.  Overall function is moderately to severely impaired with      an estimated ejection fraction of 0.30.  8.  Normal IVC.      Jacqulyn Ducking, M.D. [facility]  Electronically Signed     RR/MEDQ  D:  04/01/2006  T:  04/02/2006  Job:  [id number]

## 2011-02-02 NOTE — Assessment & Plan Note (Signed)
[facility]                              CARDIOLOGY OFFICE NOTE   NAME:[name], [name]                   MRN:          [id number]  DATE:08/12/2006                            DOB:          1946/06/13    PRIMARY CARE PHYSICIAN:  Sinda Du, M.D.   HISTORY OF PRESENT ILLNESS:  Mr. Hartung is a 65 year old gentleman who  suffered an inferior myocardial infarction complicated by cardiogenic shock  on Jan 22, 2006.  I placed a bare metal stent in the mid right coronary  artery.  He also has moderate stenosis within the distal right coronary  artery and LAD.  There is a chronic total occlusion of an obtuse marginal  which is fed via both left-to-left and right-to-left collaterals.  Ejection  fraction at that time was 20-25%.   Mr. Dibert has not had any recurrent chest discomfort.  He further denies  any exertional dyspnea, PND, orthopnea, edema, syncope, presyncope, and  palpitations.  He tells me he is compliant with his medications at his  nursing home.   His current medications are:  1. Plavix 75 mg daily.  2. Enteric-coated aspirin 325 mg daily.  3. Hydralazine 20 mg 4 times daily.  4. Imdur 30 mg daily.  5. Lipitor 40 mg daily.  6. Protonix 40 mg daily.  7. Multivitamin.  8. K-Dur 20 mEq daily.  9. Lasix 40 mg daily.  10.Lorazepam 1 mg twice daily.  11.Coreg 6.25 mg twice daily.   He was previously on lisinopril but this appears to have been stopped.   PHYSICAL EXAMINATION:  He is generally well-appearing in no distress.  Heart rate of 73.  Blood pressure 116/72 and weight of 156 pounds.  HEENT:  Normal.  SKIN:  Remarkable for hypopigmentation across his upper chest, consistent  with vitiligo.  He has no jugular venous distention, thyromegaly, or lymphadenopathy.  LUNGS:  Clear to auscultation.  Respiratory effort is normal.  He has a laterally displaced point of maximal cardiac impulse.  There is a  regular rate and rhythm  without murmur, rub, or gallop.  ABDOMEN:  Soft, nondistended, nontender.  There is no hepatosplenomegaly.  Bowel sounds are normal.  EXTREMITIES:  Warm without clubbing, cyanosis, edema, or ulceration.   IMPRESSION/RECOMMENDATIONS:  1. Coronary artery disease/ischemic cardiomyopathy:  The patient did not      show for repeat echocardiogram.  We will check BMET today.  We will      then consider re-initiation of angiotensin-converting enzyme inhibitor.      Continue Carvedilol at the present dose.  2. Chronic renal insufficiency:  Baseline creatinine was 2.2.  Right renal      artery is totally occluded.  The left was normal in June.  3. Gastroesophageal reflux disease.  4. Glucose intolerance.     Ethelle Lyon, MD  Electronically Signed    WED/MedQ  DD: 08/12/2006  DT: 08/12/2006  Job #: [id number]   cc:   Percell Miller L. Luan Pulling, M.D.

## 2011-02-21 ENCOUNTER — Other Ambulatory Visit: Payer: Self-pay | Admitting: Cardiology

## 2011-04-25 ENCOUNTER — Other Ambulatory Visit: Payer: Self-pay | Admitting: Cardiology

## 2011-05-18 ENCOUNTER — Other Ambulatory Visit: Payer: Self-pay | Admitting: Cardiology

## 2011-07-09 ENCOUNTER — Encounter: Payer: Self-pay | Admitting: Cardiology

## 2011-07-19 ENCOUNTER — Other Ambulatory Visit: Payer: Self-pay | Admitting: Cardiology

## 2011-11-22 ENCOUNTER — Other Ambulatory Visit: Payer: Self-pay | Admitting: Cardiology

## 2012-01-22 ENCOUNTER — Other Ambulatory Visit: Payer: Self-pay | Admitting: Cardiology

## 2012-01-22 ENCOUNTER — Other Ambulatory Visit: Payer: Self-pay | Admitting: Adult Health

## 2012-02-19 ENCOUNTER — Ambulatory Visit: Payer: Medicare Other | Admitting: Cardiology

## 2012-03-18 ENCOUNTER — Other Ambulatory Visit: Payer: Self-pay | Admitting: Adult Health

## 2012-03-18 ENCOUNTER — Other Ambulatory Visit: Payer: Self-pay | Admitting: Cardiology

## 2012-03-28 ENCOUNTER — Encounter: Payer: Self-pay | Admitting: Cardiology

## 2012-03-28 ENCOUNTER — Other Ambulatory Visit: Payer: Self-pay | Admitting: Cardiology

## 2012-03-28 ENCOUNTER — Ambulatory Visit (INDEPENDENT_AMBULATORY_CARE_PROVIDER_SITE_OTHER): Payer: Medicare Other | Admitting: Cardiology

## 2012-03-28 VITALS — BP 122/68 | HR 81 | Ht 67.0 in | Wt 150.0 lb

## 2012-03-28 DIAGNOSIS — Z720 Tobacco use: Secondary | ICD-10-CM

## 2012-03-28 DIAGNOSIS — F17200 Nicotine dependence, unspecified, uncomplicated: Secondary | ICD-10-CM

## 2012-03-28 DIAGNOSIS — D649 Anemia, unspecified: Secondary | ICD-10-CM

## 2012-03-28 DIAGNOSIS — K7460 Unspecified cirrhosis of liver: Secondary | ICD-10-CM | POA: Insufficient documentation

## 2012-03-28 DIAGNOSIS — K219 Gastro-esophageal reflux disease without esophagitis: Secondary | ICD-10-CM | POA: Insufficient documentation

## 2012-03-28 DIAGNOSIS — N189 Chronic kidney disease, unspecified: Secondary | ICD-10-CM

## 2012-03-28 DIAGNOSIS — I7090 Unspecified atherosclerosis: Secondary | ICD-10-CM

## 2012-03-28 DIAGNOSIS — I2510 Atherosclerotic heart disease of native coronary artery without angina pectoris: Secondary | ICD-10-CM

## 2012-03-28 DIAGNOSIS — F17210 Nicotine dependence, cigarettes, uncomplicated: Secondary | ICD-10-CM | POA: Insufficient documentation

## 2012-03-28 NOTE — Assessment & Plan Note (Addendum)
Patient is doing extremely well with respect to coronary artery disease.  He is asymptomatic at present, without symptoms or signs to suggest myocardial ischemia or congestive heart failure.  Most recent lipid profile was superb with very low total and LDL cholesterol.  Current therapy will be continued including optimal control of cardiovascular risk factors.

## 2012-03-28 NOTE — Assessment & Plan Note (Signed)
Desirability of discontinuing use of tobacco products once again discussed with patient.  Unfortunately, he is not interested in an attempt to do so.

## 2012-03-28 NOTE — Assessment & Plan Note (Signed)
1.7 in  11/2010 with otherwise normal CMet except slightly elevated FBS of 103.  Renal dysfunction is mild to moderate and stable.  We will continue to monitor.

## 2012-03-28 NOTE — Progress Notes (Deleted)
Name: [name]    DOB: [birth year]  Age: 66 y.o.  MR#: [id number]       PCP:  Alonza Bogus, MD      Insurance: @PAYORNAME @   CC:   No chief complaint on file.   VS BP 122/68  Pulse 81  Ht 5\' 7"  (1.702 m)  Wt 150 lb (68.04 kg)  BMI 23.49 kg/m2  Weights Current Weight  03/28/12 150 lb (68.04 kg)  11/30/10 147 lb (66.679 kg)  09/14/09 143 lb (64.864 kg)    Blood Pressure  BP Readings from Last 3 Encounters:  03/28/12 122/68  11/30/10 109/62  09/14/09 96/54     Admit date:  (Not on file) Last encounter with RMR:  03/18/2012   Allergy No Known Allergies  Current Outpatient Prescriptions  Medication Sig Dispense Refill  . acetaminophen (TYLENOL) 325 MG tablet Take 650 mg by mouth every 4 (four) hours.        [name] alum & mag hydroxide-simeth (MAALOX/MYLANTA) 200-200-20 MG/5 SUSP Apply 1 application topically as needed.        [name] atorvastatin (LIPITOR) 40 MG tablet Take 1 tablet (40 mg total) by mouth daily.  30 each  1  . COREG 12.5 MG tablet TAKE 1 TABLET BY MOUTH TWICE DAILY.  60 each  1  . LASIX 40 MG tablet TAKE ONE TABLET BY MOUTH ONCE DAILY.  30 each  1  . loperamide (IMODIUM A-D) 2 MG tablet Take 2 mg by mouth as needed.        . Multiple Vitamin (DAILY VITE) TABS TAKE ONE TABLET BY MOUTH ONCE DAILY.  30 each  1  . nitroGLYCERIN (NITROSTAT) 0.4 MG SL tablet Place 0.4 mg under the tongue as needed.        [name] PROTONIX 40 MG tablet TAKE ONE TABLET BY MOUTH ONCE DAILY.  30 each  1  . QC LO-DOSE ASPIRIN 81 MG EC tablet TAKE ONE TABLET BY MOUTH ONCE DAILY.  30 each  1  . DISCONTD: carvedilol (COREG) 12.5 MG tablet Take 12.5 mg by mouth 2 (two) times daily with a meal.        Discontinued Meds:    Medications Discontinued During This Encounter  Medication Reason  . carvedilol (COREG) 12.5 MG tablet Error  . dextromethorphan 15 MG/5ML syrup Error  . magnesium hydroxide (SB MILK OF MAGNESIA) 400 MG/5ML suspension Error  . ZESTRIL 10 MG tablet Error    Patient  Active Problem List  Diagnosis  . Arteriosclerotic cardiovascular disease (ASCVD)  . Gastroesophageal reflux disease  . Chronic kidney disease  . Cirrhosis  . Tobacco abuse    LABS No visits with results within 3 Month(s) from this visit. Latest known visit with results is:  [facility] Conversion Encounter on 12/02/2010  Component Date Value  . WBC 12/02/2010 5.8   . RBC 12/02/2010 3.83*  . Hemoglobin 12/02/2010 11.5*  . HCT 12/02/2010 34.4*  . MCV 12/02/2010 89.8   . MCHC 12/02/2010 33.4   . RDW 12/02/2010 14.5   . Platelets 12/02/2010 142*  . Neutrophils Relative 12/02/2010 41*  . Neutro Abs 12/02/2010 2.4   . Lymphocytes Relative 12/02/2010 41   . Lymphs Abs 12/02/2010 2.4   . Monocytes Relative 12/02/2010 7   . Monocytes Absolute 12/02/2010 0.4   . Eosinophils Relative 12/02/2010 11*  . Eosinophils Absolute 12/02/2010 0.6   . Basophils Relative 12/02/2010 1   . Basophils Absolute 12/02/2010 0.0   . Sodium 12/02/2010 137   .  Potassium 12/02/2010 4.2   . Chloride 12/02/2010 103   . CO2 12/02/2010 23   . Glucose, Bld 12/02/2010 103*  . BUN 12/02/2010 23   . Creatinine, Ser 12/02/2010 1.70*  . Total Bilirubin 12/02/2010 0.4   . Alkaline Phosphatase 12/02/2010 50   . AST 12/02/2010 21   . ALT 12/02/2010 19   . Total Protein 12/02/2010 7.1   . Albumin 12/02/2010 3.8   . Calcium 12/02/2010 9.2   . Cholesterol 12/02/2010 102   . Triglycerides 12/02/2010 80   . HDL 12/02/2010 34*  . Total CHOL/HDL Ratio 12/02/2010 3.0 Ratio   . VLDL 12/02/2010 16   . LDL Cholesterol 12/02/2010 52      Results for this Opt Visit:     Results for orders placed in visit on 12/02/10  CONVERTED [facility] LAB      Component Value Range   WBC 5.8  4.0-10.5 10*3/microliter   RBC 3.83 (*) 4.22-5.81 M/uL   Hemoglobin 11.5 (*) 13.0-17.0 g/dL   HCT 34.4 (*) 39.0-52.0 %   MCV 89.8  78.0-100.0 fL   MCHC 33.4  30.0-36.0 g/dL   RDW 14.5  11.5-15.5 %   Platelets 142 (*) 150-400 K/uL   Neutrophils  Relative 41 (*) 43-77 %   Neutro Abs 2.4  1.7-7.7 K/uL   Lymphocytes Relative 41  12-46 %   Lymphs Abs 2.4  0.7-4.0 K/uL   Monocytes Relative 7  3-12 %   Monocytes Absolute 0.4  0.1-1.0 K/uL   Eosinophils Relative 11 (*) 0-5 %   Eosinophils Absolute 0.6  0.0-0.7 K/uL   Basophils Relative 1  0-1 %   Basophils Absolute 0.0  0.0-0.1 K/uL   Sodium 137  135-145 meq/L   Potassium 4.2  3.5-5.3 meq/L   Chloride 103  96-112 meq/L   CO2 23  19-32 meq/L   Glucose, Bld 103 (*) 70-99 mg/dL   BUN 23  6-23 mg/dL   Creatinine, Ser 1.70 (*) 0.40-1.50 mg/dL   Total Bilirubin 0.4  0.3-1.2 mg/dL   Alkaline Phosphatase 50  39-117 units/L   AST 21  0-37 units/L   ALT 19  0-53 units/L   Total Protein 7.1  6.0-8.3 g/dL   Albumin 3.8  3.5-5.2 g/dL   Calcium 9.2  8.4-10.5 mg/dL   Cholesterol 102  0-200 mg/dL   Triglycerides 80  <150 mg/dL   HDL 34 (*) >39 mg/dL   Total CHOL/HDL Ratio 3.0 Ratio     VLDL 16  0-40 mg/dL   LDL Cholesterol 52  0-99 mg/dL    EKG No orders found for this or any previous visit.   Prior Assessment and Plan Problem List as of 03/28/2012            Cardiology Problems   Arteriosclerotic cardiovascular disease (ASCVD)     Other   Gastroesophageal reflux disease   Chronic kidney disease   Cirrhosis   Tobacco abuse       Imaging: No results found.   FRS Calculation: Score not calculated. Missing: Total Cholesterol

## 2012-03-28 NOTE — Patient Instructions (Addendum)
Your physician recommends that you have lab work today: CMET, CBC  Your physician wants you to follow-up in: 1 YEAR with Dr Lattie Haw.  You will receive a reminder letter in the mail two months in advance. If you don't receive a letter, please call our office to schedule the follow-up appointment.  Your physician recommends that you continue on your current medications as directed. Please refer to the Current Medication list given to you today.

## 2012-03-28 NOTE — Progress Notes (Signed)
Patient ID: FREEDOM HUR, male   DOB: 21-Dec-1945, 66 y.o.   MRN: [id number]  HPI: Annual return visit for this very nice gentleman with a history of ischemic cardiomyopathy.  He has remained essentially asymptomatic in recent years and thus has not undergone any significant testing.  In fact, he cannot recall his last visit to his primary care provider.  He has not required hospitalization or evaluation in the emergency department.  He remains active including doing yard work without difficulty.  He denies palpitations, lightheadedness or syncope.  Unfortunately, he continues to smoke cigarettes, but denies all symptoms suggestive of pulmonary disease.  Prior to Admission medications   Medication Sig Start Date End Date Taking? Authorizing Provider  acetaminophen (TYLENOL) 325 MG tablet Take 650 mg by mouth every 4 (four) hours.     Yes Historical Provider, MD  alum & mag hydroxide-simeth (MAALOX/MYLANTA) 200-200-20 MG/5 SUSP Apply 1 application topically as needed.     Yes Historical Provider, MD  atorvastatin (LIPITOR) 40 MG tablet Take 1 tablet (40 mg total) by mouth daily. 03/18/12  Yes Yehuda Savannah, MD  COREG 12.5 MG tablet TAKE 1 TABLET BY MOUTH TWICE DAILY. 03/18/12  Yes Yehuda Savannah, MD  LASIX 40 MG tablet TAKE ONE TABLET BY MOUTH ONCE DAILY. 03/18/12  Yes Lendon Colonel, NP  loperamide (IMODIUM A-D) 2 MG tablet Take 2 mg by mouth as needed.     Yes Historical Provider, MD  Multiple Vitamin (DAILY VITE) TABS TAKE ONE TABLET BY MOUTH ONCE DAILY. 03/18/12  Yes Lendon Colonel, NP  nitroGLYCERIN (NITROSTAT) 0.4 MG SL tablet Place 0.4 mg under the tongue as needed.     Yes Historical Provider, MD  PROTONIX 40 MG tablet TAKE ONE TABLET BY MOUTH ONCE DAILY. 03/18/12  Yes Lendon Colonel, NP  QC LO-DOSE ASPIRIN 81 MG EC tablet TAKE ONE TABLET BY MOUTH ONCE DAILY. 03/18/12  Yes Yehuda Savannah, MD   No Known Allergies    Past medical history, social history, and family history  reviewed and updated.  ROS: Denies dyspnea, orthopnea, PND, chest pain, cough or sputum production.  All other systems reviewed and are negative.  PHYSICAL EXAM: BP 122/68  Pulse 81  Ht 5\' 7"  (1.702 m)  Wt 68.04 kg (150 lb)  BMI 23.49 kg/m2  General-Well developed; no acute distress Body habitus-proportionate weight and height; small but wiry Neck-No JVD; no carotid bruits Lungs-clear lung fields; resonant to percussion Cardiovascular-normal PMI; normal S1 and S2; S4 present Abdomen-normal bowel sounds; soft and non-tender without masses or organomegaly Musculoskeletal-No deformities, no cyanosis or clubbing Neurologic-Normal cranial nerves; symmetric strength and tone Skin-Warm, no significant lesions Extremities-distal pulses intact; no edema   EKG:  Normal sinus rhythm, Nondiagnostic inferior Q waves with T-wave inversions, possibly representing previous inferior MI.  ASSESSMENT AND PLAN:  Jacqulyn Ducking, MD 03/28/2012 2:21 PM

## 2012-03-29 ENCOUNTER — Encounter: Payer: Self-pay | Admitting: Cardiology

## 2012-03-29 LAB — COMPREHENSIVE METABOLIC PANEL
ALT: 19 U/L (ref 0–53)
AST: 23 U/L (ref 0–37)
CO2: 27 mEq/L (ref 19–32)
Calcium: 9.2 mg/dL (ref 8.4–10.5)
Chloride: 104 mEq/L (ref 96–112)
Sodium: 139 mEq/L (ref 135–145)
Total Protein: 7.2 g/dL (ref 6.0–8.3)

## 2012-03-29 LAB — CBC WITH DIFFERENTIAL/PLATELET
Basophils Absolute: 0 10*3/uL (ref 0.0–0.1)
Lymphocytes Relative: 47 % — ABNORMAL HIGH (ref 12–46)
Lymphs Abs: 2.1 10*3/uL (ref 0.7–4.0)
Neutro Abs: 1.7 10*3/uL (ref 1.7–7.7)
Neutrophils Relative %: 37 % — ABNORMAL LOW (ref 43–77)
Platelets: 146 10*3/uL — ABNORMAL LOW (ref 150–400)
RBC: 4.35 MIL/uL (ref 4.22–5.81)
WBC: 4.5 10*3/uL (ref 4.0–10.5)

## 2012-03-31 ENCOUNTER — Encounter: Payer: Self-pay | Admitting: *Deleted

## 2012-05-26 ENCOUNTER — Other Ambulatory Visit: Payer: Self-pay | Admitting: Adult Health

## 2012-05-26 ENCOUNTER — Other Ambulatory Visit: Payer: Self-pay | Admitting: Cardiology

## 2012-09-19 ENCOUNTER — Other Ambulatory Visit: Payer: Self-pay | Admitting: Cardiology

## 2012-09-19 ENCOUNTER — Other Ambulatory Visit: Payer: Self-pay | Admitting: Adult Health

## 2012-12-16 ENCOUNTER — Other Ambulatory Visit: Payer: Self-pay | Admitting: Adult Health

## 2013-03-26 ENCOUNTER — Other Ambulatory Visit: Payer: Self-pay | Admitting: *Deleted

## 2013-03-26 MED ORDER — COREG 12.5 MG PO TABS: 12.5000 mg | ORAL_TABLET | Freq: Two times a day (BID) | ORAL | Status: DC

## 2013-04-06 ENCOUNTER — Ambulatory Visit (INDEPENDENT_AMBULATORY_CARE_PROVIDER_SITE_OTHER): Payer: Medicare Other | Admitting: Cardiology

## 2013-04-06 ENCOUNTER — Encounter: Payer: Self-pay | Admitting: Cardiology

## 2013-04-06 ENCOUNTER — Ambulatory Visit (HOSPITAL_COMMUNITY)
Admission: RE | Admit: 2013-04-06 | Discharge: 2013-04-06 | Disposition: A | Discharge status: Home / Self Care | Payer: Medicare Other | Source: Ambulatory Visit | Attending: Cardiology | Admitting: Cardiology

## 2013-04-06 VITALS — BP 133/81 | HR 74 | Ht 66.0 in | Wt 151.0 lb

## 2013-04-06 DIAGNOSIS — D649 Anemia, unspecified: Secondary | ICD-10-CM

## 2013-04-06 DIAGNOSIS — I2510 Atherosclerotic heart disease of native coronary artery without angina pectoris: Secondary | ICD-10-CM

## 2013-04-06 DIAGNOSIS — J984 Other disorders of lung: Secondary | ICD-10-CM | POA: Insufficient documentation

## 2013-04-06 DIAGNOSIS — I7090 Unspecified atherosclerosis: Secondary | ICD-10-CM

## 2013-04-06 DIAGNOSIS — K7460 Unspecified cirrhosis of liver: Secondary | ICD-10-CM

## 2013-04-06 DIAGNOSIS — I10 Essential (primary) hypertension: Secondary | ICD-10-CM

## 2013-04-06 DIAGNOSIS — J438 Other emphysema: Secondary | ICD-10-CM | POA: Insufficient documentation

## 2013-04-06 NOTE — Progress Notes (Deleted)
Name: [name]    DOB: 28-Nov-1945  Age: 67 y.o.  MR#: [id number]       PCP:  Alonza Bogus, MD      Insurance: Payor: MEDICARE / Plan: MEDICARE PART A AND B / Product Type: *No Product type* /   CC:   No chief complaint on file. PT DID NOT KNOW WHAT MEDICATIONS HE IS TAKING, PT SISTER IN LOBBY CLARIFIED PHARMACY, PHARMACY REP NOTED ALL MEDICATIONS IN CURRENT LIST HAS BEEN PICKED UP 03-24-13   VS Filed Vitals:   04/06/13 1324  BP: 133/81  Pulse: 74  Height: 5\' 6"  (1.676 m)  Weight: 151 lb (68.493 kg)    Weights Current Weight  04/06/13 151 lb (68.493 kg)  03/28/12 150 lb (68.04 kg)  11/30/10 147 lb (66.679 kg)    Blood Pressure  BP Readings from Last 3 Encounters:  04/06/13 133/81  03/28/12 122/68  11/30/10 109/62     Admit date:  (Not on file) Last encounter with RMR:  Visit date not found   Allergy Review of patient's allergies indicates no known allergies.  Current Outpatient Prescriptions  Medication Sig Dispense Refill  . acetaminophen (TYLENOL) 325 MG tablet Take 650 mg by mouth every 4 (four) hours.        . COREG 12.5 MG tablet Take 1 tablet (12.5 mg total) by mouth 2 (two) times daily with a meal.  60 tablet  6  . furosemide (LASIX) 40 MG tablet TAKE ONE TABLET BY MOUTH ONCE DAILY.  30 tablet  6  . LIPITOR 40 MG tablet TAKE 1 TABLET BY MOUTH ONCE DAILY WITH SUPPER.  30 tablet  6  . lisinopril (PRINIVIL,ZESTRIL) 10 MG tablet Take 10 mg by mouth daily.      [name] loperamide (IMODIUM A-D) 2 MG tablet Take 2 mg by mouth as needed.        . Multiple Vitamin (DAILY VITE) TABS TAKE ONE TABLET BY MOUTH ONCE DAILY.  30 each  6  . nitroGLYCERIN (NITROSTAT) 0.4 MG SL tablet Place 0.4 mg under the tongue as needed.        . pantoprazole (PROTONIX) 40 MG tablet TAKE ONE TABLET BY MOUTH ONCE DAILY.  30 tablet  6  . QC LO-DOSE ASPIRIN 81 MG EC tablet TAKE ONE TABLET BY MOUTH ONCE DAILY.  30 tablet  6   No current facility-administered medications for this visit.     Discontinued Meds:    Medications Discontinued During This Encounter  Medication Reason  . alum & mag hydroxide-simeth (MAALOX/MYLANTA) 200-200-20 MG/5 SUSP Error  . ZESTRIL 10 MG tablet Error    Patient Active Problem List   Diagnosis Date Noted  . Anemia 03/28/2012  . Arteriosclerotic cardiovascular disease (ASCVD)   . Gastroesophageal reflux disease   . Chronic kidney disease   . Cirrhosis   . Tobacco abuse     LABS    Component Value Date/Time   NA 139 03/28/2012 1517   NA 137 12/02/2010 1953   NA 136 04/19/2010   K 4.1 03/28/2012 1517   K 4.2 12/02/2010 1953   K 4.9 04/19/2010   CL 104 03/28/2012 1517   CL 103 12/02/2010 1953   CL 102 04/19/2010   CO2 27 03/28/2012 1517   CO2 23 12/02/2010 1953   CO2 25 04/19/2010   GLUCOSE 94 03/28/2012 1517   GLUCOSE 103* 12/02/2010 1953   GLUCOSE 83 04/19/2010   BUN 20 03/28/2012 1517   BUN 23 12/02/2010 1953  BUN 30 04/19/2010   CREATININE 1.48* 03/28/2012 1517   CREATININE 1.70* 12/02/2010 1953   CREATININE 1.76 04/19/2010   CREATININE 1.68 11/11/2008   CALCIUM 9.2 03/28/2012 1517   CALCIUM 9.2 12/02/2010 1953   CALCIUM 9.2 04/19/2010   GFRNONAA 28 03/13/2007 1532   GFRAA 34 03/13/2007 1532   CMP     Component Value Date/Time   NA 139 03/28/2012 1517   K 4.1 03/28/2012 1517   CL 104 03/28/2012 1517   CO2 27 03/28/2012 1517   GLUCOSE 94 03/28/2012 1517   BUN 20 03/28/2012 1517   CREATININE 1.48* 03/28/2012 1517   CREATININE 1.70* 12/02/2010 1953   CALCIUM 9.2 03/28/2012 1517   PROT 7.2 03/28/2012 1517   ALBUMIN 3.8 03/28/2012 1517   AST 23 03/28/2012 1517   ALT 19 03/28/2012 1517   ALKPHOS 57 03/28/2012 1517   BILITOT 0.5 03/28/2012 1517   GFRNONAA 28 03/13/2007 1532   GFRAA 34 03/13/2007 1532       Component Value Date/Time   WBC 4.5 03/28/2012 1517   WBC 5.8 12/02/2010 1953   WBC 6.9 03/13/2007 1532   HGB 12.7* 03/28/2012 1517   HGB 11.5* 12/02/2010 1953   HGB 11.2* 03/13/2007 1532   HCT 37.9* 03/28/2012 1517   HCT 34.4* 12/02/2010 1953   HCT  32.6* 03/13/2007 1532   MCV 87.1 03/28/2012 1517   MCV 89.8 12/02/2010 1953   MCV 88.0 03/13/2007 1532    Lipid Panel     Component Value Date/Time   CHOL 102 12/02/2010 1953   TRIG 80 12/02/2010 1953   HDL 34* 12/02/2010 1953   CHOLHDL 3.0 Ratio 12/02/2010 1953   VLDL 16 12/02/2010 1953   LDLCALC 52 12/02/2010 1953    ABG No results found for this basename: phart, pco2, pco2art, po2, po2art, hco3, tco2, acidbasedef, o2sat     No results found for this basename: TSH   BNP (last 3 results) No results found for this basename: PROBNP,  in the last 8760 hours Cardiac Panel (last 3 results) No results found for this basename: CKTOTAL, CKMB, TROPONINI, RELINDX,  in the last 72 hours  Iron/TIBC/Ferritin No results found for this basename: iron, tibc, ferritin     EKG Orders placed in visit on 04/06/13  . EKG 12-LEAD     Prior Assessment and Plan Problem List as of 04/06/2013   Arteriosclerotic cardiovascular disease (ASCVD)   Last Assessment & Plan   03/28/2012 Office Visit Edited 03/28/2012  2:42 PM by Yehuda Savannah, MD     Patient is doing extremely well with respect to coronary artery disease.  He is asymptomatic at present, without symptoms or signs to suggest myocardial ischemia or congestive heart failure.  Most recent lipid profile was superb with very low total and LDL cholesterol.  Current therapy will be continued including optimal control of cardiovascular risk factors.    Gastroesophageal reflux disease   Chronic kidney disease   Last Assessment & Plan   03/28/2012 Office Visit Written 03/28/2012  2:39 PM by Yehuda Savannah, MD     1.7 in  11/2010 with otherwise normal CMet except slightly elevated FBS of 103.  Renal dysfunction is mild to moderate and stable.  We will continue to monitor.    Cirrhosis   Tobacco abuse   Last Assessment & Plan   03/28/2012 Office Visit Written 03/28/2012  2:40 PM by Yehuda Savannah, MD     Desirability of discontinuing use of tobacco  products once  again discussed with patient.  Unfortunately, he is not interested in an attempt to do so.    Anemia       Imaging: No results found.

## 2013-04-06 NOTE — Patient Instructions (Addendum)
Your physician recommends that you schedule a follow-up appointment in: ONE YEAR  A chest x-ray takes a picture of the organs and structures inside the chest, including the heart, lungs, and blood vessels. This test can show several things, including, whether the heart is enlarges; whether fluid is building up in the lungs; and whether pacemaker / defibrillator leads are still in place.  Your physician recommends THAT YOU STOP SMOKING  Your physician recommends that you return for lab work in: [place]  Your Physician recommends that you complete the Hemoccult package given to you today, instructions are enclosed in your packet. Once completed please return to this office.

## 2013-04-10 NOTE — Progress Notes (Signed)
Patient ID: [name], male   DOB: [birth year], 67 y.o.   MRN: [id number]  HPI: Scheduled return visit for this nice gentleman for continued assessment and treatment of coronary artery disease. Since he was last seen one year ago, he has done quite well. Lifestyle is sedentary, and exercise tolerance is adequate. He typically does not experience cardiopulmonary symptoms.  Unfortunately, he continues to smoke cigarettes at the rate of one pack per day and expresses no interest in nor optimism about a possible quit attempt.  Current Outpatient Prescriptions  Medication Sig Dispense Refill  . acetaminophen (TYLENOL) 325 MG tablet Take 650 mg by mouth every 4 (four) hours.        . COREG 12.5 MG tablet Take 1 tablet (12.5 mg total) by mouth 2 (two) times daily with a meal.  60 tablet  6  . furosemide (LASIX) 40 MG tablet TAKE ONE TABLET BY MOUTH ONCE DAILY.  30 tablet  6  . LIPITOR 40 MG tablet TAKE 1 TABLET BY MOUTH ONCE DAILY WITH SUPPER.  30 tablet  6  . lisinopril (PRINIVIL,ZESTRIL) 10 MG tablet Take 10 mg by mouth daily.      [name] loperamide (IMODIUM A-D) 2 MG tablet Take 2 mg by mouth as needed.        . Multiple Vitamin (DAILY VITE) TABS TAKE ONE TABLET BY MOUTH ONCE DAILY.  30 each  6  . nitroGLYCERIN (NITROSTAT) 0.4 MG SL tablet Place 0.4 mg under the tongue as needed.        . pantoprazole (PROTONIX) 40 MG tablet TAKE ONE TABLET BY MOUTH ONCE DAILY.  30 tablet  6  . QC LO-DOSE ASPIRIN 81 MG EC tablet TAKE ONE TABLET BY MOUTH ONCE DAILY.  30 tablet  6   No current facility-administered medications for this visit.   No Known Allergies   Past medical history, social history, and family history reviewed and updated.  ROS: General: no anorexia, weight gain or weight loss Cardiac: no chest pain, dyspnea, orthopnea, PND,  or syncope Respiratory: no cough, sputum production or hemoptysis GI: no nausea, abdominal pain, emesis, diarrhea or constipation Integument: no significant  lesions Neurologic: No muscle weakness or paralysis; no speech disturbance; no headache  PHYSICAL EXAM: BP 133/81  Pulse 74  Ht 5\' 6"  (1.676 m)  Wt 68.493 kg (151 lb)  BMI 24.38 kg/m2;  Body mass index is 24.38 kg/(m^2). General-Well developed; no acute distress Body habitus-proportionate weight and height Neck-No JVD; no carotid bruits Lungs-clear lung fields; resonant to percussion; slightly prolonged expiratory phase Cardiovascular-normal PMI; normal S1 and S2 Abdomen-normal bowel sounds; soft and non-tender without masses or organomegaly Musculoskeletal-No deformities, no cyanosis or clubbing Neurologic-Normal cranial nerves; symmetric strength and tone Skin-Warm, no significant lesions Extremities-distal pulses intact; no edema  EKG: 04/06/2013. Normal sinus rhythm; single PVC; left atrial abnormality; borderline delayed R-wave progression;  T wave abnormality consistent with inferolateral ischemia or LVH. When compared with previous tracing performed 03/28/2012, no significant interval change.  Jacqulyn Ducking, MD 04/10/2013  9:28 PM  ASSESSMENT AND PLAN

## 2013-04-10 NOTE — Assessment & Plan Note (Addendum)
Patient is very stable with respect to ischemic heart disease and moderate left ventricular dysfunction. Current medications will be continued.  Patient has not been compliant with daily aspirin therapy, and is advised to continue to do so.

## 2013-04-10 NOTE — Assessment & Plan Note (Signed)
Severity of cirrhosis is unclear. Possibly a contributor to patient's chronic anemia and minimal thrombocytopenia.  Dr. Gala Romney has followed this problem in the past

## 2013-04-10 NOTE — Assessment & Plan Note (Signed)
Mild chronic kidney disease one year ago. A chemistry profile will be repeated.

## 2013-04-10 NOTE — Assessment & Plan Note (Addendum)
Normocytic normochromic anemia has been present in the past, most recently documented in 11/2010.  A repeat CBC will be obtained with iron studies and FOBT.

## 2013-04-17 ENCOUNTER — Encounter: Payer: Self-pay | Admitting: *Deleted

## 2013-04-18 ENCOUNTER — Other Ambulatory Visit: Payer: Self-pay | Admitting: Adult Health

## 2013-04-18 ENCOUNTER — Other Ambulatory Visit: Payer: Self-pay | Admitting: Cardiology

## 2013-07-21 ENCOUNTER — Other Ambulatory Visit: Payer: Self-pay | Admitting: Cardiology

## 2013-10-19 ENCOUNTER — Other Ambulatory Visit: Payer: Self-pay | Admitting: Cardiology

## 2013-11-18 ENCOUNTER — Other Ambulatory Visit: Payer: Self-pay | Admitting: Adult Health

## 2013-12-18 ENCOUNTER — Other Ambulatory Visit: Payer: Self-pay | Admitting: Adult Health

## 2014-01-18 ENCOUNTER — Other Ambulatory Visit: Payer: Self-pay | Admitting: Adult Health

## 2014-07-07 ENCOUNTER — Other Ambulatory Visit: Payer: Self-pay | Admitting: Cardiology

## 2014-07-07 ENCOUNTER — Other Ambulatory Visit: Payer: Self-pay | Admitting: Cardiovascular Disease

## 2014-08-04 ENCOUNTER — Other Ambulatory Visit: Payer: Self-pay | Admitting: Cardiovascular Disease

## 2016-04-04 ENCOUNTER — Encounter (HOSPITAL_COMMUNITY): Payer: Self-pay | Admitting: Emergency Medicine

## 2016-04-04 ENCOUNTER — Emergency Department (HOSPITAL_COMMUNITY)
Admission: EM | Admit: 2016-04-04 | Discharge: 2016-04-04 | Disposition: A | Discharge status: Home / Self Care | Payer: Medicare Other | Attending: Emergency Medicine | Admitting: Emergency Medicine

## 2016-04-04 ENCOUNTER — Emergency Department (HOSPITAL_COMMUNITY): Payer: Medicare Other

## 2016-04-04 DIAGNOSIS — I2510 Atherosclerotic heart disease of native coronary artery without angina pectoris: Secondary | ICD-10-CM | POA: Insufficient documentation

## 2016-04-04 DIAGNOSIS — Y999 Unspecified external cause status: Secondary | ICD-10-CM | POA: Insufficient documentation

## 2016-04-04 DIAGNOSIS — N189 Chronic kidney disease, unspecified: Secondary | ICD-10-CM | POA: Diagnosis not present

## 2016-04-04 DIAGNOSIS — Y939 Activity, unspecified: Secondary | ICD-10-CM | POA: Insufficient documentation

## 2016-04-04 DIAGNOSIS — S93402A Sprain of unspecified ligament of left ankle, initial encounter: Secondary | ICD-10-CM | POA: Diagnosis not present

## 2016-04-04 DIAGNOSIS — M25572 Pain in left ankle and joints of left foot: Secondary | ICD-10-CM | POA: Diagnosis not present

## 2016-04-04 DIAGNOSIS — I10 Essential (primary) hypertension: Secondary | ICD-10-CM | POA: Diagnosis not present

## 2016-04-04 DIAGNOSIS — X501XXA Overexertion from prolonged static or awkward postures, initial encounter: Secondary | ICD-10-CM | POA: Diagnosis not present

## 2016-04-04 DIAGNOSIS — S93602A Unspecified sprain of left foot, initial encounter: Secondary | ICD-10-CM | POA: Diagnosis not present

## 2016-04-04 DIAGNOSIS — J449 Chronic obstructive pulmonary disease, unspecified: Secondary | ICD-10-CM | POA: Diagnosis not present

## 2016-04-04 DIAGNOSIS — Z79899 Other long term (current) drug therapy: Secondary | ICD-10-CM | POA: Insufficient documentation

## 2016-04-04 DIAGNOSIS — Y92017 Garden or yard in single-family (private) house as the place of occurrence of the external cause: Secondary | ICD-10-CM | POA: Diagnosis not present

## 2016-04-04 DIAGNOSIS — S99912A Unspecified injury of left ankle, initial encounter: Secondary | ICD-10-CM | POA: Diagnosis present

## 2016-04-04 DIAGNOSIS — M7989 Other specified soft tissue disorders: Secondary | ICD-10-CM | POA: Diagnosis not present

## 2016-04-04 DIAGNOSIS — F17200 Nicotine dependence, unspecified, uncomplicated: Secondary | ICD-10-CM | POA: Diagnosis not present

## 2016-04-04 MED ORDER — IBUPROFEN 800 MG PO TABS: 800.0000 mg | ORAL_TABLET | Freq: Three times a day (TID) | ORAL | Status: DC

## 2016-04-04 NOTE — ED Notes (Signed)
Patient complaining of left ankle pain x 2 days. Denies injury.

## 2016-04-04 NOTE — ED Provider Notes (Signed)
CSN: [id number]     Arrival date & time 04/04/16  1613 History   First MD Initiated Contact with Patient 04/04/16 1623     Chief Complaint  Patient presents with  . Ankle Pain    HPI   70 YOM presents today with left foot pain. Pt reports that two days ago he was in his yard when he twisted this left foot causing immediate pain. Pt reports the pain as an ache that has persisted since the injury. He denies any pain to the ankle or remaining lower extremity. He has not tried any medications for pain and would not like any here. He denies any history of the same. He reports minor pain with ambulation. He denies any known medical history ( char review shows CVD, GERD, CKD, Cirrhosis).   Past Medical History  Diagnosis Date  . Arteriosclerotic cardiovascular disease (ASCVD)     IMI in 5/07 with CTO mid cx, 60% LAD, 99% RCA-> BMS; mod. impaired LV function; EF:35-40% 2/10  . Gastroesophageal reflux disease   . Chronic kidney disease     STAGE 3-4-creatinine 2.5 in 2009; 1.7 in 10/2008  . Cirrhosis ([place])     History of excessive alcohol use; continuing social use  . Tobacco abuse     60 pack years; one pack per day   History reviewed. No pertinent past surgical history. History reviewed. No pertinent family history. Social History  Substance Use Topics  . Smoking status: Current Every Day Smoker -- 1.00 packs/day for 60 years  . Smokeless tobacco: None     Comment: Currently at 1/2 ppd  . Alcohol Use: 0.0 oz/week     Comment: "I drink every chance I get" Denies daily use    Review of Systems  All other systems reviewed and are negative.   Allergies  Review of patient's allergies indicates no known allergies.  Home Medications   Prior to Admission medications   Medication Sig Start Date End Date Taking? Authorizing Provider  acetaminophen (TYLENOL) 325 MG tablet Take 650 mg by mouth every 4 (four) hours.      Historical Provider, MD  ASPIRIN LOW DOSE 81 MG EC tablet TAKE ONE  TABLET BY MOUTH ONCE DAILY. 10/19/13   Herminio Commons, MD  atorvastatin (LIPITOR) 40 MG tablet TAKE 1 TABLET BY MOUTH ONCE DAILY WITH SUPPER. 07/07/14   Herminio Commons, MD  carvedilol (COREG) 12.5 MG tablet TAKE 1 TABLET BY MOUTH TWICE DAILY. 07/07/14   Herminio Commons, MD  furosemide (LASIX) 40 MG tablet TAKE ONE TABLET BY MOUTH ONCE DAILY. 01/18/14   Lendon Colonel, NP  ibuprofen (ADVIL,MOTRIN) 800 MG tablet Take 1 tablet (800 mg total) by mouth 3 (three) times daily. 04/04/16   Noemi Chapel, MD  lisinopril (PRINIVIL,ZESTRIL) 10 MG tablet TAKE 1 TABLET BY MOUTH ONCE A DAY. 07/07/14   Herminio Commons, MD  loperamide (IMODIUM A-D) 2 MG tablet Take 2 mg by mouth as needed.      Historical Provider, MD  Multiple Vitamin (DAILY VITE) TABS TAKE ONE TABLET BY MOUTH ONCE DAILY.    Lendon Colonel, NP  nitroGLYCERIN (NITROSTAT) 0.4 MG SL tablet Place 0.4 mg under the tongue as needed.      Historical Provider, MD  pantoprazole (PROTONIX) 40 MG tablet TAKE ONE TABLET BY MOUTH ONCE DAILY. 01/18/14   Lendon Colonel, NP   BP 131/70 mmHg  Pulse 72  Temp(Src) 98 F (36.7 C) (Oral)  Resp 20  Ht  $'5\' 7"'K$  (1.702 m)  Wt 64.411 kg  BMI 22.24 kg/m2  SpO2 100%   Physical Exam  Constitutional: He is oriented to person, place, and time. He appears well-developed and well-nourished.  HENT:  Head: Normocephalic and atraumatic.  Eyes: Conjunctivae are normal. Pupils are equal, round, and reactive to light. Right eye exhibits no discharge. Left eye exhibits no discharge. No scleral icterus.  Neck: Normal range of motion. No JVD present. No tracheal deviation present.  Pulmonary/Chest: Effort normal. No stridor.  Musculoskeletal:  Bruising noted to left - toes nontender to palpation, no redness, swelling, erythema. Full active range of motion of the ankle, mainly lower extremity nontender palpation. Patient and viewed it with minimal difficulty  Neurological: He is alert and oriented to person,  place, and time. Coordination normal.  Psychiatric: He has a normal mood and affect. His behavior is normal. Judgment and thought content normal.  Nursing note and vitals reviewed.   ED Course  Procedures (including critical care time) Labs Review Labs Reviewed - No data to display  Imaging Review Dg Foot Complete Left  04/04/2016  CLINICAL DATA:  Lateral foot pain and swelling for couple of days, no known injury, initial encounter EXAM: LEFT FOOT - COMPLETE 3+ VIEW COMPARISON:  None. FINDINGS: Three views of the left foot submitted. No acute fracture or subluxation. There is soft tissue swelling in the region of fifth metatarsal phalangeal joint. Erosive changes are noted distal aspect of proximal phalanx fifth toe. Findings suspicious for osteoarthritis. Less likely septic arthritis. Clinical correlation is necessary. Mild posterior spurring of calcaneus. IMPRESSION: No acute fracture or subluxation. There is soft tissue swelling in the region of fifth metatarsal phalangeal joint. Erosive changes are noted distal aspect of proximal phalanx fifth toe. Findings suspicious for osteoarthritis. Less likely septic arthritis. Clinical correlation is necessary. Mild posterior spurring of calcaneus. Electronically Signed   By: Lahoma Crocker M.D.   On: 04/04/2016 16:54   I have personally reviewed and evaluated these images and lab results as part of my medical decision-making.   EKG Interpretation None      MDM   Final diagnoses:  Foot sprain, left, initial encounter    Labs: DG for complete left  Imaging:  Consults:  Therapeutics:  Discharge Meds:   Assessment/Plan: 70 year old male presents today with foot pain. Patient has minor bruising and redness, no warmth to touch, known injury.No signs of infectious etiology, no acute fractures on plain films, likely soft tissue injury. Patient will be instructed to use ice, rest, follow up with primary care for reevaluation of symptoms persist.  Strict return precautions given, patient verbalized understanding and agreement today's plan.        Okey Regal, PA-C 04/04/16 1916  Noemi Chapel, MD 04/06/16 [id number]

## 2016-04-04 NOTE — ED Provider Notes (Signed)
The patient is a 70 year old male, he presents after a couple of days ago turning his ankle while walking. He has tenderness focal to the left foot over the fourth metatarsal, this is soft tissue, he has normal range of motion and is ambulatory with minimal difficulty. He has no acute fractures on the x-ray according to my interpretation as well as the radiologist. Rice therapy, ankle support, the patient is stable for discharge  Medical screening examination/treatment/procedure(s) were conducted as a shared visit with non-physician practitioner(s) and myself.  I personally evaluated the patient during the encounter.  Clinical Impression:   Final diagnoses:  Foot sprain, left, initial encounter         Noemi Chapel, MD 04/06/16 [phone]

## 2016-04-04 NOTE — Discharge Instructions (Signed)
Please obtain all of your results from medical records or have your doctors office obtain the results - share them with your doctor - you should be seen at your doctors office in the next 2 days. Call today to arrange your follow up. Take the medications as prescribed. Please review all of the medicines and only take them if you do not have an allergy to them. Please be aware that if you are taking birth control pills, taking other prescriptions, ESPECIALLY ANTIBIOTICS may make the birth control ineffective - if this is the case, either do not engage in sexual activity or use alternative methods of birth control such as condoms until you have finished the medicine and your family doctor says it is OK to restart them. If you are on a blood thinner such as COUMADIN, be aware that any other medicine that you take may cause the coumadin to either work too much, or not enough - you should have your coumadin level rechecked in next 7 days if this is the case.  ?  It is also a possibility that you have an allergic reaction to any of the medicines that you have been prescribed - Everybody reacts differently to medications and while MOST people have no trouble with most medicines, you may have a reaction such as nausea, vomiting, rash, swelling, shortness of breath. If this is the case, please stop taking the medicine immediately and contact your physician.  ?  You should return to the ER if you develop severe or worsening symptom Cryotherapy Cryotherapy means treatment with cold. Ice or gel packs can be used to reduce both pain and swelling. Ice is the most helpful within the first 24 to 48 hours after an injury or flare-up from overusing a muscle or joint. Sprains, strains, spasms, burning pain, shooting pain, and aches can all be eased with ice. Ice can also be used when recovering from surgery. Ice is effective, has very few side effects, and is safe for most people to use. PRECAUTIONS  Ice is not a safe  treatment option for people with:  Raynaud phenomenon. This is a condition affecting small blood vessels in the extremities. Exposure to cold may cause your problems to return.  Cold hypersensitivity. There are many forms of cold hypersensitivity, including:  Cold urticaria. Red, itchy hives appear on the skin when the tissues begin to warm after being iced.  Cold erythema. This is a red, itchy rash caused by exposure to cold.  Cold hemoglobinuria. Red blood cells break down when the tissues begin to warm after being iced. The hemoglobin that carry oxygen are passed into the urine because they cannot combine with blood proteins fast enough.  Numbness or altered sensitivity in the area being iced. If you have any of the following conditions, do not use ice until you have discussed cryotherapy with your caregiver:  Heart conditions, such as arrhythmia, angina, or chronic heart disease.  High blood pressure.  Healing wounds or open skin in the area being iced.  Current infections.  Rheumatoid arthritis.  Poor circulation.  Diabetes. Ice slows the blood flow in the region it is applied. This is beneficial when trying to stop inflamed tissues from spreading irritating chemicals to surrounding tissues. However, if you expose your skin to cold temperatures for too long or without the proper protection, you can damage your skin or nerves. Watch for signs of skin damage due to cold. HOME CARE INSTRUCTIONS Follow these tips to use ice and cold packs  safely.  Place a dry or damp towel between the ice and skin. A damp towel will cool the skin more quickly, so you may need to shorten the time that the ice is used.  For a more rapid response, add gentle compression to the ice.  Ice for no more than 10 to 20 minutes at a time. The bonier the area you are icing, the less time it will take to get the benefits of ice.  Check your skin after 5 minutes to make sure there are no signs of a poor  response to cold or skin damage.  Rest 20 minutes or more between uses.  Once your skin is numb, you can end your treatment. You can test numbness by very lightly touching your skin. The touch should be so light that you do not see the skin dimple from the pressure of your fingertip. When using ice, most people will feel these normal sensations in this order: cold, burning, aching, and numbness.  Do not use ice on someone who cannot communicate their responses to pain, such as small children or people with dementia. HOW TO MAKE AN ICE PACK Ice packs are the most common way to use ice therapy. Other methods include ice massage, ice baths, and cryosprays. Muscle creams that cause a cold, tingly feeling do not offer the same benefits that ice offers and should not be used as a substitute unless recommended by your caregiver. To make an ice pack, do one of the following:  Place crushed ice or a bag of frozen vegetables in a sealable plastic bag. Squeeze out the excess air. Place this bag inside another plastic bag. Slide the bag into a pillowcase or place a damp towel between your skin and the bag.  Mix 3 parts water with 1 part rubbing alcohol. Freeze the mixture in a sealable plastic bag. When you remove the mixture from the freezer, it will be slushy. Squeeze out the excess air. Place this bag inside another plastic bag. Slide the bag into a pillowcase or place a damp towel between your skin and the bag. SEEK MEDICAL CARE IF:  You develop white spots on your skin. This may give the skin a blotchy (mottled) appearance.  Your skin turns blue or pale.  Your skin becomes waxy or hard.  Your swelling gets worse. MAKE SURE YOU:   Understand these instructions.  Will watch your condition.  Will get help right away if you are not doing well or get worse.   This information is not intended to replace advice given to you by your health care provider. Make sure you discuss any questions you have  with your health care provider.   Document Released: 04/30/2011 Document Revised: 09/24/2014 Document Reviewed: 04/30/2011 Elsevier Interactive Patient Education [employer]. s.

## 2016-12-13 ENCOUNTER — Inpatient Hospital Stay (HOSPITAL_COMMUNITY)
Admission: EM | Admit: 2016-12-13 | Discharge: 2016-12-17 | DRG: 683 | Disposition: A | Discharge status: Home / Self Care | Payer: Medicare Other | Attending: Pulmonary Disease | Admitting: Pulmonary Disease

## 2016-12-13 ENCOUNTER — Encounter (HOSPITAL_COMMUNITY): Payer: Self-pay

## 2016-12-13 ENCOUNTER — Emergency Department (HOSPITAL_COMMUNITY): Payer: Medicare Other

## 2016-12-13 DIAGNOSIS — E440 Moderate protein-calorie malnutrition: Secondary | ICD-10-CM | POA: Diagnosis present

## 2016-12-13 DIAGNOSIS — F17210 Nicotine dependence, cigarettes, uncomplicated: Secondary | ICD-10-CM | POA: Diagnosis present

## 2016-12-13 DIAGNOSIS — Z720 Tobacco use: Secondary | ICD-10-CM | POA: Diagnosis present

## 2016-12-13 DIAGNOSIS — N171 Acute kidney failure with acute cortical necrosis: Secondary | ICD-10-CM

## 2016-12-13 DIAGNOSIS — E871 Hypo-osmolality and hyponatremia: Secondary | ICD-10-CM | POA: Diagnosis present

## 2016-12-13 DIAGNOSIS — N189 Chronic kidney disease, unspecified: Secondary | ICD-10-CM

## 2016-12-13 DIAGNOSIS — D62 Acute posthemorrhagic anemia: Secondary | ICD-10-CM | POA: Diagnosis not present

## 2016-12-13 DIAGNOSIS — Z7982 Long term (current) use of aspirin: Secondary | ICD-10-CM | POA: Diagnosis not present

## 2016-12-13 DIAGNOSIS — K7031 Alcoholic cirrhosis of liver with ascites: Secondary | ICD-10-CM

## 2016-12-13 DIAGNOSIS — Z79899 Other long term (current) drug therapy: Secondary | ICD-10-CM | POA: Diagnosis not present

## 2016-12-13 DIAGNOSIS — N182 Chronic kidney disease, stage 2 (mild): Secondary | ICD-10-CM

## 2016-12-13 DIAGNOSIS — N170 Acute kidney failure with tubular necrosis: Secondary | ICD-10-CM

## 2016-12-13 DIAGNOSIS — I959 Hypotension, unspecified: Secondary | ICD-10-CM | POA: Diagnosis not present

## 2016-12-13 DIAGNOSIS — K7460 Unspecified cirrhosis of liver: Secondary | ICD-10-CM | POA: Diagnosis present

## 2016-12-13 DIAGNOSIS — E785 Hyperlipidemia, unspecified: Secondary | ICD-10-CM | POA: Diagnosis not present

## 2016-12-13 DIAGNOSIS — Z6821 Body mass index (BMI) 21.0-21.9, adult: Secondary | ICD-10-CM

## 2016-12-13 DIAGNOSIS — N179 Acute kidney failure, unspecified: Secondary | ICD-10-CM | POA: Diagnosis not present

## 2016-12-13 DIAGNOSIS — F1020 Alcohol dependence, uncomplicated: Secondary | ICD-10-CM | POA: Diagnosis present

## 2016-12-13 DIAGNOSIS — K219 Gastro-esophageal reflux disease without esophagitis: Secondary | ICD-10-CM | POA: Diagnosis not present

## 2016-12-13 DIAGNOSIS — N184 Chronic kidney disease, stage 4 (severe): Secondary | ICD-10-CM | POA: Diagnosis not present

## 2016-12-13 DIAGNOSIS — R778 Other specified abnormalities of plasma proteins: Secondary | ICD-10-CM | POA: Diagnosis present

## 2016-12-13 DIAGNOSIS — R739 Hyperglycemia, unspecified: Secondary | ICD-10-CM | POA: Diagnosis not present

## 2016-12-13 DIAGNOSIS — I2510 Atherosclerotic heart disease of native coronary artery without angina pectoris: Secondary | ICD-10-CM | POA: Diagnosis present

## 2016-12-13 DIAGNOSIS — R7989 Other specified abnormal findings of blood chemistry: Secondary | ICD-10-CM | POA: Diagnosis present

## 2016-12-13 DIAGNOSIS — R251 Tremor, unspecified: Secondary | ICD-10-CM | POA: Diagnosis present

## 2016-12-13 DIAGNOSIS — E861 Hypovolemia: Secondary | ICD-10-CM | POA: Diagnosis not present

## 2016-12-13 DIAGNOSIS — R269 Unspecified abnormalities of gait and mobility: Secondary | ICD-10-CM | POA: Diagnosis not present

## 2016-12-13 DIAGNOSIS — I129 Hypertensive chronic kidney disease with stage 1 through stage 4 chronic kidney disease, or unspecified chronic kidney disease: Principal | ICD-10-CM | POA: Diagnosis present

## 2016-12-13 DIAGNOSIS — R634 Abnormal weight loss: Secondary | ICD-10-CM | POA: Diagnosis present

## 2016-12-13 HISTORY — DX: Hyperlipidemia, unspecified: E78.5

## 2016-12-13 LAB — CBC WITH DIFFERENTIAL/PLATELET
Basophils Absolute: 0 10*3/uL (ref 0.0–0.1)
Basophils Relative: 0 %
EOS ABS: 0.2 10*3/uL (ref 0.0–0.7)
EOS PCT: 2 %
HCT: 39.3 % (ref 39.0–52.0)
Hemoglobin: 13.7 g/dL (ref 13.0–17.0)
LYMPHS ABS: 3.1 10*3/uL (ref 0.7–4.0)
Lymphocytes Relative: 42 %
MCH: 30.5 pg (ref 26.0–34.0)
MCHC: 34.9 g/dL (ref 30.0–36.0)
MCV: 87.5 fL (ref 78.0–100.0)
Monocytes Absolute: 0.4 10*3/uL (ref 0.1–1.0)
Monocytes Relative: 6 %
Neutro Abs: 3.7 10*3/uL (ref 1.7–7.7)
Neutrophils Relative %: 50 %
PLATELETS: 188 10*3/uL (ref 150–400)
RBC: 4.49 MIL/uL (ref 4.22–5.81)
RDW: 13 % (ref 11.5–15.5)
WBC: 7.5 10*3/uL (ref 4.0–10.5)

## 2016-12-13 LAB — COMPREHENSIVE METABOLIC PANEL
ALT: 11 U/L — ABNORMAL LOW (ref 17–63)
AST: 18 U/L (ref 15–41)
Albumin: 3.4 g/dL — ABNORMAL LOW (ref 3.5–5.0)
Alkaline Phosphatase: 46 U/L (ref 38–126)
Anion gap: 11 (ref 5–15)
BUN: 48 mg/dL — ABNORMAL HIGH (ref 6–20)
CHLORIDE: 99 mmol/L — AB (ref 101–111)
CO2: 22 mmol/L (ref 22–32)
Calcium: 9.7 mg/dL (ref 8.9–10.3)
Creatinine, Ser: 3.04 mg/dL — ABNORMAL HIGH (ref 0.61–1.24)
GFR, EST AFRICAN AMERICAN: 22 mL/min — AB (ref >60)
GFR, EST NON AFRICAN AMERICAN: 19 mL/min — AB (ref >60)
Glucose, Bld: 142 mg/dL — ABNORMAL HIGH (ref 65–99)
POTASSIUM: 4.9 mmol/L (ref 3.5–5.1)
Sodium: 132 mmol/L — ABNORMAL LOW (ref 135–145)
Total Bilirubin: 0.8 mg/dL (ref 0.3–1.2)
Total Protein: 8.3 g/dL — ABNORMAL HIGH (ref 6.5–8.1)

## 2016-12-13 LAB — TROPONIN I
TROPONIN I: 0.04 ng/mL — AB (ref <0.03)
Troponin I: 0.03 ng/mL (ref <0.03)

## 2016-12-13 LAB — CBG MONITORING, ED: Glucose-Capillary: 170 mg/dL — ABNORMAL HIGH (ref 65–99)

## 2016-12-13 LAB — LIPASE, BLOOD: LIPASE: 40 U/L (ref 11–51)

## 2016-12-13 LAB — PROTIME-INR
INR: 1.04
PROTHROMBIN TIME: 13.6 s (ref 11.4–15.2)

## 2016-12-13 LAB — TSH: TSH: 1.2 u[IU]/mL (ref 0.350–4.500)

## 2016-12-13 LAB — I-STAT CG4 LACTIC ACID, ED: LACTIC ACID, VENOUS: 1.82 mmol/L (ref 0.5–1.9)

## 2016-12-13 LAB — GLUCOSE, CAPILLARY: Glucose-Capillary: 111 mg/dL — ABNORMAL HIGH (ref 65–99)

## 2016-12-13 MED ORDER — ENSURE ENLIVE PO LIQD
237.0000 mL | Freq: Two times a day (BID) | ORAL | Status: DC
  Administered 2016-12-14 – 2016-12-17 (×2): 237 mL via ORAL

## 2016-12-13 MED ORDER — SODIUM CHLORIDE 0.9 % IV BOLUS (SEPSIS)
1000.0000 mL | Freq: Once | INTRAVENOUS | Status: AC
  Administered 2016-12-13: 1000 mL via INTRAVENOUS

## 2016-12-13 MED ORDER — SODIUM CHLORIDE 0.9% FLUSH
3.0000 mL | Freq: Two times a day (BID) | INTRAVENOUS | Status: DC
  Administered 2016-12-14 – 2016-12-16 (×4): 3 mL via INTRAVENOUS

## 2016-12-13 MED ORDER — DOCUSATE SODIUM 100 MG PO CAPS
100.0000 mg | ORAL_CAPSULE | Freq: Two times a day (BID) | ORAL | Status: DC
  Administered 2016-12-13 – 2016-12-17 (×8): 100 mg via ORAL
  Filled 2016-12-13 (×8): qty 1

## 2016-12-13 MED ORDER — SODIUM CHLORIDE 0.9 % IV SOLN
INTRAVENOUS | Status: DC
  Administered 2016-12-14 (×2): via INTRAVENOUS
  Administered 2016-12-15: 1 mL via INTRAVENOUS

## 2016-12-13 MED ORDER — LORAZEPAM 1 MG PO TABS: 1.0000 mg | ORAL_TABLET | Freq: Four times a day (QID) | ORAL | Status: AC | PRN

## 2016-12-13 MED ORDER — ACETAMINOPHEN 325 MG PO TABS: 650.0000 mg | ORAL_TABLET | Freq: Four times a day (QID) | ORAL | Status: DC | PRN

## 2016-12-13 MED ORDER — ASPIRIN EC 81 MG PO TBEC
81.0000 mg | DELAYED_RELEASE_TABLET | Freq: Every day | ORAL | Status: DC
  Administered 2016-12-14 – 2016-12-17 (×4): 81 mg via ORAL
  Filled 2016-12-13 (×4): qty 1

## 2016-12-13 MED ORDER — ENOXAPARIN SODIUM 30 MG/0.3ML ~~LOC~~ SOLN
30.0000 mg | SUBCUTANEOUS | Status: DC
  Administered 2016-12-13 – 2016-12-16 (×3): 30 mg via SUBCUTANEOUS
  Filled 2016-12-13 (×4): qty 0.3

## 2016-12-13 MED ORDER — ONDANSETRON HCL 4 MG/2ML IJ SOLN: 4.0000 mg | Freq: Four times a day (QID) | INTRAMUSCULAR | Status: DC | PRN

## 2016-12-13 MED ORDER — FOLIC ACID 1 MG PO TABS
1.0000 mg | ORAL_TABLET | Freq: Every day | ORAL | Status: DC
  Administered 2016-12-14 – 2016-12-17 (×4): 1 mg via ORAL
  Filled 2016-12-13 (×4): qty 1

## 2016-12-13 MED ORDER — THIAMINE HCL 100 MG/ML IJ SOLN
100.0000 mg | Freq: Every day | INTRAMUSCULAR | Status: DC
  Filled 2016-12-13: qty 2

## 2016-12-13 MED ORDER — ONDANSETRON HCL 4 MG PO TABS: 4.0000 mg | ORAL_TABLET | Freq: Four times a day (QID) | ORAL | Status: DC | PRN

## 2016-12-13 MED ORDER — SODIUM CHLORIDE 0.9 % IV SOLN
INTRAVENOUS | Status: DC
  Administered 2016-12-13: 18:00:00 via INTRAVENOUS

## 2016-12-13 MED ORDER — LORAZEPAM 2 MG/ML IJ SOLN: 1.0000 mg | Freq: Four times a day (QID) | INTRAMUSCULAR | Status: AC | PRN

## 2016-12-13 MED ORDER — VITAMIN B-1 100 MG PO TABS
100.0000 mg | ORAL_TABLET | Freq: Every day | ORAL | Status: DC
  Administered 2016-12-14 – 2016-12-17 (×4): 100 mg via ORAL
  Filled 2016-12-13 (×4): qty 1

## 2016-12-13 MED ORDER — PANTOPRAZOLE SODIUM 40 MG PO TBEC
40.0000 mg | DELAYED_RELEASE_TABLET | Freq: Every day | ORAL | Status: DC
  Administered 2016-12-14 – 2016-12-17 (×4): 40 mg via ORAL
  Filled 2016-12-13 (×4): qty 1

## 2016-12-13 MED ORDER — ACETAMINOPHEN 650 MG RE SUPP: 650.0000 mg | Freq: Four times a day (QID) | RECTAL | Status: DC | PRN

## 2016-12-13 MED ORDER — NICOTINE 21 MG/24HR TD PT24
21.0000 mg | MEDICATED_PATCH | Freq: Every day | TRANSDERMAL | Status: DC
  Administered 2016-12-14 – 2016-12-16 (×3): 21 mg via TRANSDERMAL
  Filled 2016-12-13 (×4): qty 1

## 2016-12-13 MED ORDER — INSULIN ASPART 100 UNIT/ML ~~LOC~~ SOLN: 0.0000 [IU] | Freq: Three times a day (TID) | SUBCUTANEOUS | Status: DC

## 2016-12-13 MED ORDER — ADULT MULTIVITAMIN W/MINERALS CH
1.0000 | ORAL_TABLET | Freq: Every day | ORAL | Status: DC
  Administered 2016-12-14 – 2016-12-17 (×4): 1 via ORAL
  Filled 2016-12-13 (×4): qty 1

## 2016-12-13 NOTE — ED Triage Notes (Signed)
Sister reports that pt has not been coming downstairs for the last 2 days to eat. He has been staying in his room and she has noticied his gait is unstable, speech is different and not eating or drinking. Last time normal was monday

## 2016-12-13 NOTE — ED Provider Notes (Signed)
[facility] DEPT Provider Note   CSN: [id number] Arrival date & time: 12/13/16  1827     History   Chief Complaint Chief Complaint  Patient presents with  . Weakness  . Hypotension    HPI [name] is a 71 y.o. male.  Patient brought in by family members. Patient's family states she has not been coming downstairs tear drink for the last 2 days. Had been staying in his room. They noted that his gait seemed to be off. Supposedly speech different. Talked about generalized weakness. Patient noticed these symptoms since Tuesday. Here patient denied any specific complaints. Denied any chest pain headache shortness of breath abdominal pain nausea or vomiting. Visual changes. He denied any speech problems.      Past Medical History:  Diagnosis Date  . Arteriosclerotic cardiovascular disease (ASCVD)    IMI in 5/07 with CTO mid cx, 60% LAD, 99% RCA-> BMS; mod. impaired LV function; EF:35-40% 2/10  . Chronic kidney disease    STAGE 3-4-creatinine 2.5 in 2009; 1.7 in 10/2008  . Cirrhosis ([place])    History of excessive alcohol use; continuing social use  . Gastroesophageal reflux disease   . Hyperlipidemia   . Tobacco abuse    60 pack years; one pack per day    Patient Active Problem List   Diagnosis Date Noted  . Acute renal failure (ARF) ([place]) 12/13/2016  . Anemia, normocytic normochromic 03/28/2012  . Arteriosclerotic cardiovascular disease (ASCVD)   . Gastroesophageal reflux disease   . Chronic kidney disease   . Cirrhosis ([place])   . Tobacco abuse     History reviewed. No pertinent surgical history.     Home Medications    Prior to Admission medications   Medication Sig Start Date End Date Taking? Authorizing Provider  ASPIRIN LOW DOSE 81 MG EC tablet TAKE ONE TABLET BY MOUTH ONCE DAILY. 10/19/13  Yes Herminio Commons, MD  carvedilol (COREG) 12.5 MG tablet TAKE 1 TABLET BY MOUTH TWICE DAILY. 07/07/14  Yes Herminio Commons, MD  castor oil liquid Take 10  mLs by mouth daily as needed for moderate constipation.   Yes Historical Provider, MD  furosemide (LASIX) 40 MG tablet TAKE ONE TABLET BY MOUTH ONCE DAILY. 01/18/14  Yes Lendon Colonel, NP  lisinopril (PRINIVIL,ZESTRIL) 10 MG tablet TAKE 1 TABLET BY MOUTH ONCE A DAY. 07/07/14  Yes Herminio Commons, MD  Multiple Vitamin (DAILY VITE) TABS TAKE ONE TABLET BY MOUTH ONCE DAILY.   Yes Lendon Colonel, NP  pantoprazole (PROTONIX) 40 MG tablet TAKE ONE TABLET BY MOUTH ONCE DAILY. 01/18/14  Yes Lendon Colonel, NP  atorvastatin (LIPITOR) 40 MG tablet TAKE 1 TABLET BY MOUTH ONCE DAILY WITH SUPPER. 07/07/14   Herminio Commons, MD  nitroGLYCERIN (NITROSTAT) 0.4 MG SL tablet Place 0.4 mg under the tongue as needed.      Historical Provider, MD    Family History No family history on file.  Social History Social History  Substance Use Topics  . Smoking status: Current Every Day Smoker    Packs/day: 1.00    Years: 60.00  . Smokeless tobacco: Never Used     Comment: Currently at 1/2 ppd  . Alcohol use 0.0 oz/week     Allergies   Patient has no known allergies.   Review of Systems Review of Systems  Constitutional: Positive for appetite change.  HENT: Negative for congestion.   Eyes: Negative for visual disturbance.  Respiratory: Negative for shortness of breath.  Cardiovascular: Negative for chest pain.  Gastrointestinal: Negative for abdominal pain.  Genitourinary: Negative for dysuria.  Musculoskeletal: Negative for back pain.  Skin: Negative for rash.  Neurological: Positive for weakness. Negative for speech difficulty and headaches.  Hematological: Does not bruise/bleed easily.  Psychiatric/Behavioral: Negative for confusion.     Physical Exam Updated Vital Signs BP 105/62   Pulse 61   Temp 98 F (36.7 C) (Temporal)   Resp 12   Wt 51.8 kg   SpO2 99%   BMI 17.90 kg/m   Physical Exam  Constitutional: He appears well-developed and well-nourished. No distress.    HENT:  Head: Normocephalic and atraumatic.  Mucous membranes dry.  Eyes: EOM are normal. Pupils are equal, round, and reactive to light.  Neck: Normal range of motion.  Cardiovascular: Normal rate, regular rhythm and normal heart sounds.   Pulmonary/Chest: Effort normal and breath sounds normal. No respiratory distress.  Abdominal: Soft. Bowel sounds are normal. He exhibits distension. There is no tenderness.  Musculoskeletal: Normal range of motion. He exhibits no edema.  Neurological: He is alert. No cranial nerve deficit or sensory deficit. He exhibits normal muscle tone. Coordination normal.  Skin: Skin is warm.  Nursing note and vitals reviewed.    ED Treatments / Results  Labs (all labs ordered are listed, but only abnormal results are displayed) Labs Reviewed  COMPREHENSIVE METABOLIC PANEL - Abnormal; Notable for the following:       Result Value   Sodium 132 (*)    Chloride 99 (*)    Glucose, Bld 142 (*)    BUN 48 (*)    Creatinine, Ser 3.04 (*)    Total Protein 8.3 (*)    Albumin 3.4 (*)    ALT 11 (*)    GFR calc non Af Amer 19 (*)    GFR calc Af Amer 22 (*)    All other components within normal limits  TROPONIN I - Abnormal; Notable for the following:    Troponin I 0.04 (*)    All other components within normal limits  CBG MONITORING, ED - Abnormal; Notable for the following:    Glucose-Capillary 170 (*)    All other components within normal limits  CULTURE, BLOOD (ROUTINE X 2)  CULTURE, BLOOD (ROUTINE X 2)  URINE CULTURE  CBC WITH DIFFERENTIAL/PLATELET  LIPASE, BLOOD  PROTIME-INR  URINALYSIS, ROUTINE W REFLEX MICROSCOPIC  I-STAT CG4 LACTIC ACID, ED   Results for orders placed or performed during the hospital encounter of 12/13/16  CBC with Differential/Platelet  Result Value Ref Range   WBC 7.5 4.0 - 10.5 K/uL   RBC 4.49 4.22 - 5.81 MIL/uL   Hemoglobin 13.7 13.0 - 17.0 g/dL   HCT 39.3 39.0 - 52.0 %   MCV 87.5 78.0 - 100.0 fL   MCH 30.5 26.0 - 34.0  pg   MCHC 34.9 30.0 - 36.0 g/dL   RDW 13.0 11.5 - 15.5 %   Platelets 188 150 - 400 K/uL   Neutrophils Relative % 50 %   Neutro Abs 3.7 1.7 - 7.7 K/uL   Lymphocytes Relative 42 %   Lymphs Abs 3.1 0.7 - 4.0 K/uL   Monocytes Relative 6 %   Monocytes Absolute 0.4 0.1 - 1.0 K/uL   Eosinophils Relative 2 %   Eosinophils Absolute 0.2 0.0 - 0.7 K/uL   Basophils Relative 0 %   Basophils Absolute 0.0 0.0 - 0.1 K/uL  Lipase, blood  Result Value Ref Range   Lipase 40 11 -  51 U/L  Comprehensive metabolic panel  Result Value Ref Range   Sodium 132 (L) 135 - 145 mmol/L   Potassium 4.9 3.5 - 5.1 mmol/L   Chloride 99 (L) 101 - 111 mmol/L   CO2 22 22 - 32 mmol/L   Glucose, Bld 142 (H) 65 - 99 mg/dL   BUN 48 (H) 6 - 20 mg/dL   Creatinine, Ser 3.04 (H) 0.61 - 1.24 mg/dL   Calcium 9.7 8.9 - 10.3 mg/dL   Total Protein 8.3 (H) 6.5 - 8.1 g/dL   Albumin 3.4 (L) 3.5 - 5.0 g/dL   AST 18 15 - 41 U/L   ALT 11 (L) 17 - 63 U/L   Alkaline Phosphatase 46 38 - 126 U/L   Total Bilirubin 0.8 0.3 - 1.2 mg/dL   GFR calc non Af Amer 19 (L) >60 mL/min   GFR calc Af Amer 22 (L) >60 mL/min   Anion gap 11 5 - 15  Protime-INR  Result Value Ref Range   Prothrombin Time 13.6 11.4 - 15.2 seconds   INR 1.04   Troponin I  Result Value Ref Range   Troponin I 0.04 (HH) <0.03 ng/mL  CBG monitoring, ED  Result Value Ref Range   Glucose-Capillary 170 (H) 65 - 99 mg/dL  I-Stat CG4 Lactic Acid, ED  Result Value Ref Range   Lactic Acid, Venous 1.82 0.5 - 1.9 mmol/L   Dg Chest Port 1 View  Result Date: 12/13/2016 CLINICAL DATA:  Acute onset of unstable gait.  Initial encounter. EXAM: PORTABLE CHEST 1 VIEW COMPARISON:  Chest radiograph performed 04/06/2013 FINDINGS: The lungs are well-aerated and clear. There is no evidence of focal opacification, pleural effusion or pneumothorax. The cardiomediastinal silhouette is within normal limits. No acute osseous abnormalities are seen. IMPRESSION: No acute cardiopulmonary process  seen. Electronically Signed   By: Garald Balding M.D.   On: 12/13/2016 19:39     EKG  EKG Interpretation  Date/Time:  Thursday December 13 2016 19:15:05 EDT Ventricular Rate:  65 PR Interval:    QRS Duration: 130 QT Interval:  419 QTC Calculation: 436 R Axis:   47 Text Interpretation:  Sinus rhythm Anterior infarct, old Nonspecific T abnormalities, inferior leads No significant change since last tracing Confirmed by Sherald Balbuena  MD, Ramir Malerba [phone]) on 12/13/2016 7:27:23 PM       Radiology Dg Chest Port 1 View  Result Date: 12/13/2016 CLINICAL DATA:  Acute onset of unstable gait.  Initial encounter. EXAM: PORTABLE CHEST 1 VIEW COMPARISON:  Chest radiograph performed 04/06/2013 FINDINGS: The lungs are well-aerated and clear. There is no evidence of focal opacification, pleural effusion or pneumothorax. The cardiomediastinal silhouette is within normal limits. No acute osseous abnormalities are seen. IMPRESSION: No acute cardiopulmonary process seen. Electronically Signed   By: Garald Balding M.D.   On: 12/13/2016 19:39    Procedures Procedures (including critical care time)  Medications Ordered in ED Medications  0.9 %  sodium chloride infusion ( Intravenous New Bag/Given 12/13/16 2108)  sodium chloride 0.9 % bolus 1,000 mL (0 mLs Intravenous Stopped 12/13/16 2105)     Initial Impression / Assessment and Plan / ED Course  I have reviewed the triage vital signs and the nursing notes.  Pertinent labs & imaging results that were available during my care of the patient were reviewed by me and considered in my medical decision making (see chart for details).    Workup here patient was hypotensive upon arrival. Patient received 1 L fluid blood pressure  came up to a systolic of 709. Labs show evidence of acute renal failure. May be prerenal. Patient's abdomen somewhat distended but nontender. Liver function tests without significant abnormalities. Patient does is perkier with the fluids.  Patient's neuro exam without any focal neural deficits at all. The only other lab abnormality was a slight elevation in the troponin at 0.04. This also could be associated to the acute renal failure.  Discussed with hospitalist they will admit terminally this is a prerenal or a primary renal problem.  Patient's blood pressure is now stabilized. Quickly responded to fluids. Patient's lactic acid was negative.   Final Clinical Impressions(s) / ED Diagnoses   Final diagnoses:  Hypotension, unspecified hypotension type  Acute renal failure, unspecified acute renal failure type [facility])    New Prescriptions New Prescriptions   No medications on file     Fredia Sorrow, MD 12/13/16 2125

## 2016-12-13 NOTE — ED Notes (Signed)
CRITICAL VALUE ALERT  Critical value received:  Troponin 0.04  Date of notification:  12/13/16  Time of notification:  5486  Critical value read back:Yes.    Nurse who received alert:  Y. Daquarius Dubeau, RN  Responding MD:  Dr. Rogene Houston  Time MD responded:  [phone]

## 2016-12-13 NOTE — ED Notes (Signed)
Pt states that he is still not able to provide urine sample.

## 2016-12-13 NOTE — H&P (Signed)
History and Physical    [name] YPP:[id number] DOB: 24-Jun-1946 DOA: 12/13/2016  PCP: Alonza Bogus, MD Consultants:  None Patient coming from: home - lives with his sister; Trudie Reed [phone], [phone]  Chief Complaint: weakness  HPI: [name] is a 71 y.o. male with medical history significant of tobacco and alcohol abuse, HLD, GERD, cirrhosis, CKD, and ASCVD presenting with acute renal failure.  The history was obtained by the patient (who had very little to say); the sister who was present (who appeared to be confused and lack information about the patient since she doesn't live with him); and the sister he lives with (who was not present but we spoke to her briefly by telephone).  Patient is not eating or drinking, not going to the bathroom - not urinating or stooling.  Unsure why he is not eating or drinking.  Symptoms started maybe a week ago.  He doessn't have dementia but he doesn't remember things, according to the sister who was present.  Has lost a lot of weight - 151 to 114.  He was 151 "I guess before he lost all this weight" - according to the sister in the room.  The one on the telephone thinks may be was 151 in January.   Didn't want to hardly walk.  Trembling, won't eat.   Monday, he went out with a friend and he hasn't left his room since - couldn't get him to eat which is unusual for him.  He hasn't peed since Tuesday.  Sister gave him castor oil to try to help him have a BM without success.  Hand trembles, can't hold it steady.  [address] and hurt himself on his back.    His sister would also like for him to have a colonoscopy while he is here, since he was referred as an outpatient and wouldn't go.   ED Course: Hypotensive upon arrival, responsive to 1L IVF.  Acute renal failure, likely prerenal.  Troponin 0.04 without chest pain.  Review of Systems: As per HPI; otherwise 10 point review of systems reviewed and negative.   Ambulatory Status:   ambulated without assistance prior  Past Medical History:  Diagnosis Date  . Arteriosclerotic cardiovascular disease (ASCVD)    IMI in 5/07 with CTO mid cx, 60% LAD, 99% RCA-> BMS; mod. impaired LV function; EF:35-40% 2/10  . Chronic kidney disease    STAGE 3-4-creatinine 2.5 in 2009; 1.7 in 10/2008  . Cirrhosis ([place])    History of excessive alcohol use; continuing social use  . Gastroesophageal reflux disease   . Hyperlipidemia   . Tobacco abuse    60 pack years; one pack per day    History reviewed. No pertinent surgical history.  Social History   Social History  . Marital status: Single    Spouse name: N/A  . Number of children: N/A  . Years of education: N/A   Occupational History  . Retired    Social History Main Topics  . Smoking status: Current Every Day Smoker    Packs/day: 1.00    Years: 60.00  . Smokeless tobacco: Never Used  . Alcohol use 0.0 oz/week     Comment: 2+ beers daily, last drink "you tell me"  . Drug use: No  . Sexual activity: Not on file   Other Topics Concern  . Not on file   Social History Narrative   Resides in supervised living situation   No regular exercise    No Known Allergies  History reviewed. No pertinent family history.  Prior to Admission medications   Medication Sig Start Date End Date Taking? Authorizing Provider  ASPIRIN LOW DOSE 81 MG EC tablet TAKE ONE TABLET BY MOUTH ONCE DAILY. 10/19/13  Yes Herminio Commons, MD  carvedilol (COREG) 12.5 MG tablet TAKE 1 TABLET BY MOUTH TWICE DAILY. 07/07/14  Yes Herminio Commons, MD  castor oil liquid Take 10 mLs by mouth daily as needed for moderate constipation.   Yes Historical Provider, MD  furosemide (LASIX) 40 MG tablet TAKE ONE TABLET BY MOUTH ONCE DAILY. 01/18/14  Yes Lendon Colonel, NP  lisinopril (PRINIVIL,ZESTRIL) 10 MG tablet TAKE 1 TABLET BY MOUTH ONCE A DAY. 07/07/14  Yes Herminio Commons, MD  Multiple Vitamin (DAILY VITE) TABS TAKE ONE TABLET BY MOUTH ONCE  DAILY.   Yes Lendon Colonel, NP  pantoprazole (PROTONIX) 40 MG tablet TAKE ONE TABLET BY MOUTH ONCE DAILY. 01/18/14  Yes Lendon Colonel, NP  atorvastatin (LIPITOR) 40 MG tablet TAKE 1 TABLET BY MOUTH ONCE DAILY WITH SUPPER. 07/07/14   Herminio Commons, MD  nitroGLYCERIN (NITROSTAT) 0.4 MG SL tablet Place 0.4 mg under the tongue as needed.      Historical Provider, MD    Physical Exam: Vitals:   12/13/16 2030 12/13/16 2046 12/13/16 2100 12/13/16 2130  BP: 110/64 110/64 105/62 111/60  Pulse: 61 62 61 63  Resp: '14 18 12 17  '$ Temp:      TempSrc:      SpO2: 99% 100% 99% 99%  Weight:         General: Appears calm and comfortable and is NAD Eyes:  PERRL, EOMI, normal lids, iris ENT:  grossly normal hearing, lips & tongue, mmm Neck:  no LAD, masses or thyromegaly Cardiovascular:  RRR, no m/r/g. No LE edema.  Respiratory:  CTA bilaterally, no w/r/r. Normal respiratory effort. Abdomen:  soft, ntnd, NABS Skin:  no rash or induration seen on limited exam Musculoskeletal:  grossly normal tone BUE/BLE, good ROM, no bony abnormality Psychiatric:  grossly normal mood and affect, speech fluent and appropriate, AOx3; he spoke very little and made virtually no eye contact, appearing to prefer watching television.  He did seem perfectly content with this, though. Neurologic:  CN 2-12 grossly intact, moves all extremities in coordinated fashion, sensation intact  Labs on Admission: I have personally reviewed following labs and imaging studies  CBC:  Recent Labs Lab 12/13/16 1857  WBC 7.5  NEUTROABS 3.7  HGB 13.7  HCT 39.3  MCV 87.5  PLT 353   Basic Metabolic Panel:  Recent Labs Lab 12/13/16 1857  NA 132*  K 4.9  CL 99*  CO2 22  GLUCOSE 142*  BUN 48*  CREATININE 3.04*  CALCIUM 9.7   GFR: CrCl cannot be calculated (Unknown ideal weight.). Liver Function Tests:  Recent Labs Lab 12/13/16 1857  AST 18  ALT 11*  ALKPHOS 46  BILITOT 0.8  PROT 8.3*  ALBUMIN 3.4*     Recent Labs Lab 12/13/16 1857  LIPASE 40   No results for input(s): AMMONIA in the last 168 hours. Coagulation Profile:  Recent Labs Lab 12/13/16 1857  INR 1.04   Cardiac Enzymes:  Recent Labs Lab 12/13/16 1855  TROPONINI 0.04*   BNP (last 3 results) No results for input(s): PROBNP in the last 8760 hours. HbA1C: No results for input(s): HGBA1C in the last 72 hours. CBG:  Recent Labs Lab 12/13/16 1911  GLUCAP 170*   Lipid Profile: No  results for input(s): CHOL, HDL, LDLCALC, TRIG, CHOLHDL, LDLDIRECT in the last 72 hours. Thyroid Function Tests: No results for input(s): TSH, T4TOTAL, FREET4, T3FREE, THYROIDAB in the last 72 hours. Anemia Panel: No results for input(s): VITAMINB12, FOLATE, FERRITIN, TIBC, IRON, RETICCTPCT in the last 72 hours. Urine analysis: No results found for: COLORURINE, APPEARANCEUR, LABSPEC, PHURINE, GLUCOSEU, HGBUR, BILIRUBINUR, KETONESUR, PROTEINUR, UROBILINOGEN, NITRITE, LEUKOCYTESUR  Creatinine Clearance: CrCl cannot be calculated (Unknown ideal weight.).  Sepsis Labs: '@LABRCNTIP'$ (procalcitonin:4,lacticidven:4) )No results found for this or any previous visit (from the past 240 hour(s)).   Radiological Exams on Admission: Dg Chest Port 1 View  Result Date: 12/13/2016 CLINICAL DATA:  Acute onset of unstable gait.  Initial encounter. EXAM: PORTABLE CHEST 1 VIEW COMPARISON:  Chest radiograph performed 04/06/2013 FINDINGS: The lungs are well-aerated and clear. There is no evidence of focal opacification, pleural effusion or pneumothorax. The cardiomediastinal silhouette is within normal limits. No acute osseous abnormalities are seen. IMPRESSION: No acute cardiopulmonary process seen. Electronically Signed   By: Garald Balding M.D.   On: 12/13/2016 19:39    EKG: Independently reviewed.  NSR with rate 65; nonspecific ST changes with no evidence of acute ischemia, NSCSLT  Assessment/Plan Principal Problem:   Acute kidney injury  superimposed on chronic kidney disease (HCC) Active Problems:   Tobacco abuse   Unintentional weight loss   Alcohol dependence (HCC)   Hyperglycemia   Elevated troponin   Acute renal failure with CKD -Uncertain baseline since his last labs in Epic were in 2013 -Current creatinine is 3.04, prior was 1.48 in 7/13 -Lactate 1.82 -Blood cultures x 2 pending -Based on family report of no PO intake for at least several days, suspect that this is related to prerenal azotemia -He was also hypotensive upon arrival in the ER and it was fluid responsive; he is now normotensive -Will observe and rehydrate -Recheck creatinine in the AM -If creatinine is improving with IVF, there may be no further need for evaluation/treatment if the patient is able to eat/drink  Unintentional weight loss -In the ER in 7/17, the patient weighed 141.7 -He is currently 114 pounds -He does have hyperglycemia and so it is possible that this weight loss is from uncontrolled DM (see below), but he is not in DKA and does not have a current anion gap -Unfortunately, he completely lacks other symptoms that would indicate a need to image  -With a normal hemoglobin and no GI symptoms, outpatient GI follow-up is appropriate -His albumin is essentially normal, which indicates that he is not significantly malnourished despite his weight loss -I am concerned about his alcoholism as a cause - despite normal LFTs -Will also check UDS -We do not have a urine sample from him yet, but this may also indicate a reason for his symptoms potentially  Hyperglycemia -Glucose 142, 170 -Will check A1c -Will cover with SSI  Elevated troponin -Troponin 0.04 -This is likely related to his renal insufficiency -Denies chest pain -Will trend  Tobacco dependence -Encourage cessation.  This was discussed with the patient and should be reviewed on an ongoing basis.   -Patch ordered.  Alcohol dependence -Sister describes tremors, inability  to eat/drink, not leaving his room -He has an unusual affect and appears quite comfortable but generally makes poor eye contact and is focused on the TV -While he may not be continuing to drink, his behaviors bring up the concern for withdrawal -Will place on CIWA protocol for now   DVT prophylaxis: Lovenox Code Status: Full - confirmed  with patient/family Family Communication: Spoke with 2 sisters, as per HPI Disposition Plan:  Home once clinically improved Consults called: None  Admission status: It is my clinical opinion that referral for OBSERVATION is reasonable and necessary in this patient based on the above information provided. The aforementioned taken together are felt to place the patient at high risk for further clinical deterioration. However it is anticipated that the patient may be medically stable for discharge from the hospital within 24 to 48 hours.    Karmen Bongo MD [facility]  If 7PM-7AM, please contact night-coverage [email or web address] Password [email or web address]  12/13/2016, 10:02 PM

## 2016-12-13 NOTE — ED Notes (Signed)
Pt states he is unable to provide a urine sample at this time. Urinal left at bedside, pt and family member informed of need for sample

## 2016-12-14 ENCOUNTER — Observation Stay (HOSPITAL_COMMUNITY): Payer: Medicare Other

## 2016-12-14 DIAGNOSIS — N179 Acute kidney failure, unspecified: Secondary | ICD-10-CM | POA: Diagnosis not present

## 2016-12-14 DIAGNOSIS — K7460 Unspecified cirrhosis of liver: Secondary | ICD-10-CM | POA: Diagnosis present

## 2016-12-14 DIAGNOSIS — Z7982 Long term (current) use of aspirin: Secondary | ICD-10-CM | POA: Diagnosis not present

## 2016-12-14 DIAGNOSIS — R251 Tremor, unspecified: Secondary | ICD-10-CM | POA: Diagnosis present

## 2016-12-14 DIAGNOSIS — I2510 Atherosclerotic heart disease of native coronary artery without angina pectoris: Secondary | ICD-10-CM | POA: Diagnosis present

## 2016-12-14 DIAGNOSIS — F1020 Alcohol dependence, uncomplicated: Secondary | ICD-10-CM | POA: Diagnosis present

## 2016-12-14 DIAGNOSIS — R809 Proteinuria, unspecified: Secondary | ICD-10-CM | POA: Diagnosis not present

## 2016-12-14 DIAGNOSIS — Z79899 Other long term (current) drug therapy: Secondary | ICD-10-CM | POA: Diagnosis not present

## 2016-12-14 DIAGNOSIS — N170 Acute kidney failure with tubular necrosis: Secondary | ICD-10-CM | POA: Diagnosis not present

## 2016-12-14 DIAGNOSIS — I959 Hypotension, unspecified: Secondary | ICD-10-CM | POA: Diagnosis present

## 2016-12-14 DIAGNOSIS — E861 Hypovolemia: Secondary | ICD-10-CM | POA: Diagnosis present

## 2016-12-14 DIAGNOSIS — K219 Gastro-esophageal reflux disease without esophagitis: Secondary | ICD-10-CM | POA: Diagnosis present

## 2016-12-14 DIAGNOSIS — I129 Hypertensive chronic kidney disease with stage 1 through stage 4 chronic kidney disease, or unspecified chronic kidney disease: Secondary | ICD-10-CM | POA: Diagnosis present

## 2016-12-14 DIAGNOSIS — E440 Moderate protein-calorie malnutrition: Secondary | ICD-10-CM | POA: Diagnosis present

## 2016-12-14 DIAGNOSIS — E871 Hypo-osmolality and hyponatremia: Secondary | ICD-10-CM | POA: Diagnosis present

## 2016-12-14 DIAGNOSIS — E785 Hyperlipidemia, unspecified: Secondary | ICD-10-CM | POA: Diagnosis present

## 2016-12-14 DIAGNOSIS — D62 Acute posthemorrhagic anemia: Secondary | ICD-10-CM | POA: Diagnosis present

## 2016-12-14 DIAGNOSIS — Z720 Tobacco use: Secondary | ICD-10-CM | POA: Diagnosis not present

## 2016-12-14 DIAGNOSIS — D649 Anemia, unspecified: Secondary | ICD-10-CM | POA: Diagnosis not present

## 2016-12-14 DIAGNOSIS — Z6821 Body mass index (BMI) 21.0-21.9, adult: Secondary | ICD-10-CM | POA: Diagnosis not present

## 2016-12-14 DIAGNOSIS — R739 Hyperglycemia, unspecified: Secondary | ICD-10-CM | POA: Diagnosis present

## 2016-12-14 DIAGNOSIS — R748 Abnormal levels of other serum enzymes: Secondary | ICD-10-CM | POA: Diagnosis not present

## 2016-12-14 DIAGNOSIS — F17210 Nicotine dependence, cigarettes, uncomplicated: Secondary | ICD-10-CM | POA: Diagnosis present

## 2016-12-14 DIAGNOSIS — N184 Chronic kidney disease, stage 4 (severe): Secondary | ICD-10-CM | POA: Diagnosis not present

## 2016-12-14 LAB — BASIC METABOLIC PANEL
Anion gap: 8 (ref 5–15)
BUN: 46 mg/dL — AB (ref 6–20)
CALCIUM: 8.7 mg/dL — AB (ref 8.9–10.3)
CHLORIDE: 106 mmol/L (ref 101–111)
CO2: 23 mmol/L (ref 22–32)
CREATININE: 2.33 mg/dL — AB (ref 0.61–1.24)
GFR calc Af Amer: 30 mL/min — ABNORMAL LOW (ref >60)
GFR calc non Af Amer: 26 mL/min — ABNORMAL LOW (ref >60)
Glucose, Bld: 100 mg/dL — ABNORMAL HIGH (ref 65–99)
Potassium: 4.2 mmol/L (ref 3.5–5.1)
SODIUM: 137 mmol/L (ref 135–145)

## 2016-12-14 LAB — CBC
HCT: 32.7 % — ABNORMAL LOW (ref 39.0–52.0)
HCT: 35 % — ABNORMAL LOW (ref 39.0–52.0)
Hemoglobin: 11.2 g/dL — ABNORMAL LOW (ref 13.0–17.0)
Hemoglobin: 12 g/dL — ABNORMAL LOW (ref 13.0–17.0)
MCH: 30.3 pg (ref 26.0–34.0)
MCH: 30.3 pg (ref 26.0–34.0)
MCHC: 34.3 g/dL (ref 30.0–36.0)
MCHC: 34.3 g/dL (ref 30.0–36.0)
MCV: 88.4 fL (ref 78.0–100.0)
MCV: 88.4 fL (ref 78.0–100.0)
PLATELETS: 147 10*3/uL — AB (ref 150–400)
Platelets: 163 10*3/uL (ref 150–400)
RBC: 3.7 MIL/uL — ABNORMAL LOW (ref 4.22–5.81)
RBC: 3.96 MIL/uL — AB (ref 4.22–5.81)
RDW: 13.2 % (ref 11.5–15.5)
RDW: 13.2 % (ref 11.5–15.5)
WBC: 6.6 10*3/uL (ref 4.0–10.5)
WBC: 5.5 10*3/uL (ref 4.0–10.5)

## 2016-12-14 LAB — RAPID URINE DRUG SCREEN, HOSP PERFORMED
AMPHETAMINES: NOT DETECTED
Barbiturates: NOT DETECTED
Benzodiazepines: NOT DETECTED
COCAINE: NOT DETECTED
OPIATES: NOT DETECTED
Tetrahydrocannabinol: NOT DETECTED

## 2016-12-14 LAB — GLUCOSE, CAPILLARY: GLUCOSE-CAPILLARY: 100 mg/dL — AB (ref 65–99)

## 2016-12-14 LAB — SEDIMENTATION RATE: Sed Rate: 94 mm/hr — ABNORMAL HIGH (ref 0–16)

## 2016-12-14 LAB — PROTEIN, URINE, RANDOM: Total Protein, Urine: 12 mg/dL

## 2016-12-14 LAB — AMMONIA

## 2016-12-14 LAB — TSH: TSH: 0.943 u[IU]/mL (ref 0.350–4.500)

## 2016-12-14 LAB — TROPONIN I
TROPONIN I: 0.03 ng/mL — AB (ref <0.03)
Troponin I: 0.03 ng/mL (ref <0.03)

## 2016-12-14 LAB — AMYLASE: Amylase: 104 U/L — ABNORMAL HIGH (ref 28–100)

## 2016-12-14 LAB — CREATININE, URINE, RANDOM: Creatinine, Urine: 109.95 mg/dL

## 2016-12-14 LAB — VITAMIN B12: VITAMIN B 12: 676 pg/mL (ref 180–914)

## 2016-12-14 LAB — SODIUM, URINE, RANDOM: Sodium, Ur: 98 mmol/L

## 2016-12-14 LAB — FOLATE: Folate: 20.2 ng/mL (ref >5.9)

## 2016-12-14 NOTE — Progress Notes (Signed)
Patient alert and conversive with me answers questions directly. He states he has not drank alcohol in quite some time. Smokes 3-5 cigarettes per day creatinine improved to 2.33 with fluid resuscitation right renal atrophy noted by sonogram plan right now is to get serum ammonia level to rule out hepatic encephalopathy continue Cipro protocol [name] MOQ:[id number] DOB: [birth year] DOA: 12/13/2016 PCP: Alexander Bogus, MD   Physical Exam: Blood pressure 130/68, pulse 70, temperature 98 F (36.7 C), temperature source Temporal, resp. rate 18, height '5\' 2"'$  (1.575 m), weight 53.6 kg (118 lb 1.6 oz), SpO2 100 %. Lungs clear to A&P no rales wheeze rhonchi heart regular rhythm no murmurs goes heaves rubs abdomen soft nontender bowel sounds normoactive   Investigations:  Recent Results (from the past 240 hour(s))  Culture, blood (Routine X 2) w Reflex to ID Panel     Status: None (Preliminary result)   Collection Time: 12/13/16  7:52 PM  Result Value Ref Range Status   Specimen Description BLOOD RIGHT ARM  Final   Special Requests BOTTLES DRAWN AEROBIC ONLY 10CC  Final   Culture PENDING  Incomplete   Report Status PENDING  Incomplete  Culture, blood (Routine X 2) w Reflex to ID Panel     Status: None (Preliminary result)   Collection Time: 12/13/16  8:02 PM  Result Value Ref Range Status   Specimen Description BLOOD RIGHT HAND  Final   Special Requests BOTTLES DRAWN AEROBIC ONLY 10CC  Final   Culture NO GROWTH < 24 HOURS  Final   Report Status PENDING  Incomplete     Basic Metabolic Panel:  Recent Labs  12/13/16 1857 12/14/16 0433  NA 132* 137  K 4.9 4.2  CL 99* 106  CO2 22 23  GLUCOSE 142* 100*  BUN 48* 46*  CREATININE 3.04* 2.33*  CALCIUM 9.7 8.7*   Liver Function Tests:  Recent Labs  12/13/16 1857  AST 18  ALT 11*  ALKPHOS 46  BILITOT 0.8  PROT 8.3*  ALBUMIN 3.4*     CBC:  Recent Labs  12/13/16 1857 12/14/16 0433 12/14/16 0956  WBC 7.5 6.6 5.5   NEUTROABS 3.7  --   --   HGB 13.7 11.2* 12.0*  HCT 39.3 32.7* 35.0*  MCV 87.5 88.4 88.4  PLT 188 163 147*    US Renal  Result Date: 12/14/2016 CLINICAL DATA:  Acute tubular necrosis, proteinuria a EXAM: RENAL / URINARY TRACT ULTRASOUND COMPLETE COMPARISON:  None. FINDINGS: Right Kidney: Length: 5.8 cm.  Right kidney is atrophic without hydronephrosis. Left Kidney: Length: 10.9 cm. Echogenicity within normal limits. No mass or hydronephrosis visualized. Bladder: Appears normal for degree of bladder distention. Only left ureteral jet is visualized. IMPRESSION: 1. Atrophic right kidney without hydronephrosis measures 5.8 cm in length. 2. Normal size left kidney. No left hydronephrosis. Only left ureteral jet is visualized. The urinary bladder is unremarkable. Electronically Signed   By: [name] M.D.   On: 12/14/2016 10:45   Dg Chest Port 1 View  Result Date: 12/13/2016 CLINICAL DATA:  Acute onset of unstable gait.  Initial encounter. EXAM: PORTABLE CHEST 1 VIEW COMPARISON:  Chest radiograph performed 04/06/2013 FINDINGS: The lungs are well-aerated and clear. There is no evidence of focal opacification, pleural effusion or pneumothorax. The cardiomediastinal silhouette is within normal limits. No acute osseous abnormalities are seen. IMPRESSION: No acute cardiopulmonary process seen. Electronically Signed   By: [name] M.D.   On: 12/13/2016 19:39      Medications:  Impression:  Principal Problem:   Acute kidney injury superimposed on chronic kidney disease ([place]) Active Problems:   Tobacco abuse   Unintentional weight loss   Alcohol dependence ([place])   Hyperglycemia   Elevated troponin     Plan: Monitor renal function continue Ativan protocol monitor electrolytes dementia workup begun serum ammonia level ordered for possible hepatic encephalopathy  Consultants: Renal   Procedures renal ultrasound   Antibiotics:         Time spent: 30 minutes   LOS: 0 days    [name] M   12/14/2016, 11:32 AM

## 2016-12-14 NOTE — Evaluation (Signed)
Physical Therapy Evaluation Patient Details Name: [name] MRN: [id number] DOB: 01/18/1946 Today's Date: 12/14/2016   History of Present Illness  71 y.o. male with medical history significant of tobacco and alcohol abuse, HLD, GERD, cirrhosis, CKD, and ASCVD presenting with acute renal failure.  The history was obtained by the patient (who had very little to say); the sister who was present (who appeared to be confused and lack information about the patient since she doesn't live with him); and the sister he lives with (who was not present but we spoke to her briefly by telephone).  Patient is not eating or drinking, not going to the bathroom - not urinating or stooling.  Unsure why he is not eating or drinking.  Symptoms started maybe a week ago.  He doessn't have dementia but he doesn't remember things, according to the sister who was present.  Has lost a lot of weight - 151 to 114.  He was 151 "I guess before he lost all this weight" - according to the sister in the room.  The one on the telephone thinks may be was 151 in January.   Didn't want to hardly walk.  Trembling, won't eat.   Monday, he went out with a friend and he hasn't left his room since - couldn't get him to eat which is unusual for him.  He hasn't peed since Tuesday.  Sister gave him castor oil to try to help him have a BM without success.  Hand trembles, can't hold it steady.  [address] and hurt himself on his back.      Clinical Impression  Pt received in bed, sister arrived during PT evaluation, and pt is agreeable to PT.  Pt is normally independent with ambulation, and ADL's, however he does not drive.  He lives with his sister, and has 24/7 supervision.  During PT evaluation, he perform all functional mobility at independent level, and ambulated 26f with supervision.  He did have a few LOB during gait, however, he was able to correct independently using stepping strategy.  Recommend he follow up with OPPT.  No acute PT needs  at this time, will sign off.     Follow Up Recommendations Outpatient PT    Equipment Recommendations  None recommended by PT    Recommendations for Other Services       Precautions / Restrictions Precautions Precautions: Fall Precaution Comments: Reason for admission.  Sister also states he had another fall in the yard.  Restrictions Weight Bearing Restrictions: No      Mobility  Bed Mobility Overal bed mobility: Independent                Transfers Overall transfer level: Independent                  Ambulation/Gait Ambulation/Gait assistance: Supervision Ambulation Distance (Feet): 200 Feet Assistive device: None Gait Pattern/deviations: Step-through pattern     General Gait Details: Occasional LOB with correction using stepping strategy independently  Stairs            Wheelchair Mobility    Modified Rankin (Stroke Patients Only)       Balance Overall balance assessment: History of Falls                                           Pertinent Vitals/Pain Pain Assessment: No/denies pain    Home  Living   Living Arrangements: Other relatives (sister) Available Help at Discharge: Family Type of Home: House         Home Equipment: None      Prior Function Level of Independence: Independent         Comments: sister drives     Hand Dominance        Extremity/Trunk Assessment   Upper Extremity Assessment Upper Extremity Assessment: Overall WFL for tasks assessed    Lower Extremity Assessment Lower Extremity Assessment: Overall WFL for tasks assessed       Communication   Communication: No difficulties  Cognition Arousal/Alertness: Awake/alert Behavior During Therapy: WFL for tasks assessed/performed Overall Cognitive Status: Within Functional Limits for tasks assessed                                        General Comments      Exercises     Assessment/Plan    PT  Assessment All further PT needs can be met in the next venue of care  PT Problem List Decreased balance       PT Treatment Interventions      PT Goals (Current goals can be found in the Care Plan section)  Acute Rehab PT Goals PT Goal Formulation: All assessment and education complete, DC therapy    Frequency     Barriers to discharge        Co-evaluation               End of Session Equipment Utilized During Treatment: Gait belt Activity Tolerance: Patient tolerated treatment well Patient left: in bed Nurse Communication: Mobility status PT Visit Diagnosis: History of falling (Z91.81)    Time: 1443-1500 PT Time Calculation (min) (ACUTE ONLY): 17 min   Charges:   PT Evaluation $PT Eval Low Complexity: 1 Procedure     PT G Codes:   PT G-Codes **NOT FOR INPATIENT CLASS** Functional Assessment Tool Used: AM-PAC 6 Clicks Basic Mobility;Clinical judgement Functional Limitation: Mobility: Walking and moving around Mobility: Walking and Moving Around Current Status (N2778): At least 20 percent but less than 40 percent impaired, limited or restricted Mobility: Walking and Moving Around Goal Status [phone]): At least 20 percent but less than 40 percent impaired, limited or restricted Mobility: Walking and Moving Around Discharge Status [phone]): At least 20 percent but less than 40 percent impaired, limited or restricted    Beth Astor Gentle, PT, DPT X: [phone]

## 2016-12-14 NOTE — Progress Notes (Addendum)
Initial Nutrition Assessment  DOCUMENTATION CODES:  Non-severe (moderate) malnutrition in context of chronic illness  INTERVENTION:  2X protein at each meal  Ensure Enlive po BID, each supplement provides 350 kcal and 20 grams of protein  MVI with minerals  Very skeptical of pts reported history of eating well (see below).  Very well could still be drinking and meet severe criteria  NUTRITION DIAGNOSIS:  Malnutrition (Moderate) related to social / environmental circumstances vs chronic illness (Cirrhosis/CKD vs ETOH abuse) as evidenced by  Loss of 15% bw in <9 months and moderate Fat/Muscle wasting  GOAL:  Patient will meet greater than or equal to 90% of their needs  MONITOR:  PO intake, Supplement acceptance, Labs, Weight trends  REASON FOR ASSESSMENT:  Malnutrition Screening Tool    ASSESSMENT:  71 y/o male PMHx Tobacco/etoh abuse, HLD, GERD, Cirrhosis, CKD3-4, and ASCVD. Patient reported to be not eating, drinking, urinating, or stooling. Has trouble remembering this. Has lost weight. Worked up for acute on chronic renal failure   RD states that he had heard pt was not eating well to which he responds "I think I have been eating well enough". When asked how many times a day he eats, he reply's "as many times as they give it to me". He says the individuals with whom he lives prepares meals for him. He says he eats what they bring though. He says he took vitamins, but could not name them. He says the family provided him with supplements like Ensure/Boost but could not specify which one.   He sounded to be saying things that he thought RD wanted to hear ie following therapeutic diet, taking vitamins, drinking supps. Once pressed for specifics, however, such as what vitamins he took, what nutritional supplements the family provided him he could not say. To investigate, RD asked if he checked his blood sugars daily and he said yes, however at this time he is not diabetic and would not  really have a need to do this. As such am very skeptical of patients reported history and he very well could be still drinking.  Potentially of value, when RD asked if he liked the Ensure at his bedside, he made a joke stating "it's not 86 proof". RD asked if that's what he drank and he stated he hadnt drank in a long time.  He gives a UBW of 135 lbs. He says he is '5\' 7"'$ . He did not comment when RD stated he is now 118 lbs. Per chart review, has lost 17% bw in last 8 months. Using current diagnostic criterion for malnutrition, 15% bw loss at 9 months would be clinically significant as 10% at 6 months and 20% at 12 is significant.   At this time, pt says he has a good appetite. He says he enjoys meat and would be happy to receive 2x portions. Will order. Continue Ensure BID  Physical exam: Severe wasting of clavicular musculature. Mild-mod wasting of temporalis, deltoids, interosseous. Moderate wasting of tricep fat folds and thorax.   Labs: Renal fx improving, BG 100-140, ALBUMINl 3.4,  Medications: Colace, Ensure Enlive, Insulin, MVI with min, Thiamin, ppi, IVF   Recent Labs Lab 12/13/16 1857 12/14/16 0433  NA 132* 137  K 4.9 4.2  CL 99* 106  CO2 22 23  BUN 48* 46*  CREATININE 3.04* 2.33*  CALCIUM 9.7 8.7*  GLUCOSE 142* 100*   Diet Order:  Diet Carb Modified Fluid consistency: Thin; Room service appropriate? Yes  Skin:  Reviewed,  no issues  Last BM:  3/26  Height:  Ht Readings from Last 1 Encounters:  12/13/16 '5\' 2"'$  (1.575 m)  Pt reports ht as 5'7"  Weight:  Wt Readings from Last 1 Encounters:  12/13/16 118 lb 1.6 oz (53.6 kg)   Wt Readings from Last 10 Encounters:  12/13/16 118 lb 1.6 oz (53.6 kg)  04/04/16 142 lb (64.4 kg)  04/06/13 151 lb (68.5 kg)  03/28/12 150 lb (68 kg)  11/30/10 147 lb (66.7 kg)  09/14/09 143 lb (64.9 kg)   Ideal Body Weight:  67.27 kg (Using ht of '5\' 7"'$  )  BMI:  Pt reports ht is 5'7, which is also more consistent with past encounter  measurements: Body mass index is 18.51 kg/m.  Estimated Nutritional Needs:  Kcal:  1700-1900 (32-35 kcal/kg bw) Protein:  75-85g Pro (1.4-1.6 g/kg bw) Fluid:  >1.6 L (30 ml/kg bw)  EDUCATION NEEDS:  No education needs identified at this time  Burtis Junes RD, LDN, [facility] Pager: [phone] 12/14/2016 2:50 PM

## 2016-12-14 NOTE — Care Management Obs Status (Signed)
[facility] NOTIFICATION   Patient Details  Name: [name] MRN: [id number] Date of Birth: Jul 25, 1946   Medicare Observation Status Notification Given:  Yes    Sherald Barge, RN 12/14/2016, 9:38 AM

## 2016-12-14 NOTE — Consult Note (Signed)
[name] MRN: [id number] DOB/AGE: 01/24/1946 72 y.o. Primary Care Physician:HAWKINS,EDWARD L, MD Admit date: 12/13/2016 Chief Complaint:  Chief Complaint  Patient presents with  . Weakness  . Hypotension   HPI: Pt is 71 year old male with past medical hx of CKD, Cirrhosis who was brought to ER with c/o weakness.  HPI dates back to around 7-8 days ago when pt family noticed that he was not eating or drinking. Pt was becoming progressively weak.Pt sister had  reported that pt has not been coming downstairs for the last 2 days to eat. Pt  has been staying in his room and she has noticied his gait is unstable, speech is different and not eating or drinking. Pt sister noticed that last time normal was Monday. Pt admitted for AKI. Pt seen today on 3rd floor. NO c/o chest pain No c/o fever/cough/chills No c/o nausea/vomiting/diarrhea NO c/o abdominal pain No c/o hematuria No c/o blood in stools    Past Medical History:  Diagnosis Date  . Arteriosclerotic cardiovascular disease (ASCVD)    IMI in 5/07 with CTO mid cx, 60% LAD, 99% RCA-> BMS; mod. impaired LV function; EF:35-40% 2/10  . Chronic kidney disease    STAGE 3-4-creatinine 2.5 in 2009; 1.7 in 10/2008  . Cirrhosis ([place])    History of excessive alcohol use; continuing social use  . Gastroesophageal reflux disease   . Hyperlipidemia   . Tobacco abuse    60 pack years; one pack per day        History reviewed. No pertinent family history.Pt gives no hx of ESRD  Social History:  reports that he has been smoking.  He has a 60.00 pack-year smoking history. He has never used smokeless tobacco. He reports that he drinks alcohol. He reports that he does not use drugs.   Allergies: No Known Allergies  Medications Prior to Admission  Medication Sig Dispense Refill  . ASPIRIN LOW DOSE 81 MG EC tablet TAKE ONE TABLET BY MOUTH ONCE DAILY. 30 tablet 6  . carvedilol (COREG) 12.5 MG tablet TAKE 1 TABLET BY MOUTH TWICE DAILY.  60 tablet 0  . castor oil liquid Take 10 mLs by mouth daily as needed for moderate constipation.    . furosemide (LASIX) 40 MG tablet TAKE ONE TABLET BY MOUTH ONCE DAILY. 30 tablet 6  . lisinopril (PRINIVIL,ZESTRIL) 10 MG tablet TAKE 1 TABLET BY MOUTH ONCE A DAY. 30 tablet 0  . Multiple Vitamin (DAILY VITE) TABS TAKE ONE TABLET BY MOUTH ONCE DAILY. 90 tablet 1  . pantoprazole (PROTONIX) 40 MG tablet TAKE ONE TABLET BY MOUTH ONCE DAILY. 30 tablet 6  . nitroGLYCERIN (NITROSTAT) 0.4 MG SL tablet Place 0.4 mg under the tongue as needed.           SWN:IOEVO from the symptoms mentioned above,there are no other symptoms referable to all systems reviewed.  [name] aspirin EC  81 mg Oral Daily  . docusate sodium  100 mg Oral BID  . enoxaparin (LOVENOX) injection  30 mg Subcutaneous Q24H  . feeding supplement (ENSURE ENLIVE)  237 mL Oral BID BM  . folic acid  1 mg Oral Daily  . insulin aspart  0-15 Units Subcutaneous TID WC  . multivitamin with minerals  1 tablet Oral Daily  . nicotine  21 mg Transdermal Daily  . pantoprazole  40 mg Oral Daily  . sodium chloride flush  3 mL Intravenous Q12H  . thiamine  100 mg Oral Daily   Or  .  thiamine  100 mg Intravenous Daily      Physical Exam: Vital signs in last 24 hours: Temp:  [98 F (36.7 C)] 98 F (36.7 C) (03/29 1832) Pulse Rate:  [61-80] 70 (03/30 0500) Resp:  [12-20] 18 (03/30 0500) BP: (70-130)/(43-90) 130/68 (03/30 0500) SpO2:  [99 %-100 %] 100 % (03/30 0500) Weight:  [114 lb 4.8 oz (51.8 kg)-118 lb 1.6 oz (53.6 kg)] 118 lb 1.6 oz (53.6 kg) (03/29 2231) Weight change:  Last BM Date: 12/10/16  Intake/Output from previous day: 03/29 0701 - 03/30 0700 In: 1568.3 [I.V.:568.3; IV Piggyback:1000] Out: 300 [Urine:300] No intake/output data recorded.   Physical Exam: General- pt is awake,follows commands. Resp- No acute REsp distress, NO Rhonchi CVS- S1S2 regular in rate and rhythm GIT- BS+, soft, NT, ND EXT- NO LE Edema,  Cyanosis CNS- CN 2-12 grossly intact. Moving all 4 extremities Psych- normal mood and affect    Lab Results: CBC  Recent Labs  12/13/16 1857 12/14/16 0433  WBC 7.5 6.6  HGB 13.7 11.2*  HCT 39.3 32.7*  PLT 188 163    BMET  Recent Labs  12/13/16 1857 12/14/16 0433  NA 132* 137  K 4.9 4.2  CL 99* 106  CO2 22 23  GLUCOSE 142* 100*  BUN 48* 46*  CREATININE 3.04* 2.33*  CALCIUM 9.7 8.7*   Creat trend 2018 3.0=>2.3 2013 1.48 2012  1.7 2011   1.7 2010   1.68 2008    2.5      MICRO Recent Results (from the past 240 hour(s))  Culture, blood (Routine X 2) w Reflex to ID Panel     Status: None (Preliminary result)   Collection Time: 12/13/16  7:52 PM  Result Value Ref Range Status   Specimen Description BLOOD RIGHT ARM  Final   Special Requests BOTTLES DRAWN AEROBIC ONLY 10CC  Final   Culture PENDING  Incomplete   Report Status PENDING  Incomplete  Culture, blood (Routine X 2) w Reflex to ID Panel     Status: None (Preliminary result)   Collection Time: 12/13/16  8:02 PM  Result Value Ref Range Status   Specimen Description BLOOD RIGHT HAND  Final   Special Requests BOTTLES DRAWN AEROBIC ONLY 10CC  Final   Culture NO GROWTH < 12 HOURS  Final   Report Status PENDING  Incomplete      Lab Results  Component Value Date   CALCIUM 8.7 (L) 12/14/2016   Alb  3.4 Corrected calcium  8.7+ 0.5=9.2   Impression: 1)Renal  AKI secondary to Prerenal/ATN               AKI sec to  Hypovolemia/Hypotension/ACE               AKI on CKD               CKD stage 3/4.               CKD since 2008               CKD secondary to Ischemic nephropathy/Age associated decline                Progression of CKD marked with AKI                Proteinura will check  2)HTN  BP stable   3)Anemia HGb at goal (9--11)   4)CKD Mineral-Bone Disorder PTH not avail. Secondary Hyperparathyroidism w/u pending. Phosphorus will check.   5)Liver-hx o  Cirrhosis Primary MD  following  6)Electrolytes  Normokalemic  Hyponatremic    Hypovolemic hyponatremia    Sodium better  7)Acid base Co2 at goal     Plan:  Will ask for renal u.s Will ask for CKD-BMD data Will ask for FENA Will ask for spot protein/creat ratio Will follow BMEt   Cherilynn Schomburg S 12/14/2016, 9:14 AM

## 2016-12-14 NOTE — Care Management Note (Signed)
Case Management Note  Patient Details  Name: [name] MRN: [id number] Date of Birth: 10-29-1945  Subjective/Objective:                  Pt admitted from home, he lives with his sister and daughter. He reports being ind with ADL's. He has PCP, he says he goes to PCP when he needs something but not regularly. He uses no DME with ambulation. He has no HH services pta. ETOH use documented but pt reports he mostly drings water and "would drink alcohol if he could get it".  Pt plans to return home with his sister and daughter at [place].   Action/Plan: PT eval pending. Anticipate need to [facility] at [place].   Expected Discharge Date:       12/15/2016           Expected Discharge Plan:  [place]  In-House Referral:  NA  Discharge planning Services  CM Consult  Status of Service:  In process, will continue to follow  Sherald Barge, RN 12/14/2016, 1:16 PM

## 2016-12-15 DIAGNOSIS — E440 Moderate protein-calorie malnutrition: Secondary | ICD-10-CM | POA: Insufficient documentation

## 2016-12-15 LAB — BASIC METABOLIC PANEL
ANION GAP: 7 (ref 5–15)
BUN: 28 mg/dL — AB (ref 6–20)
CALCIUM: 8.4 mg/dL — AB (ref 8.9–10.3)
CO2: 22 mmol/L (ref 22–32)
Chloride: 110 mmol/L (ref 101–111)
Creatinine, Ser: 1.38 mg/dL — ABNORMAL HIGH (ref 0.61–1.24)
GFR calc Af Amer: 57 mL/min — ABNORMAL LOW (ref >60)
GFR, EST NON AFRICAN AMERICAN: 50 mL/min — AB (ref >60)
Glucose, Bld: 97 mg/dL (ref 65–99)
POTASSIUM: 3.6 mmol/L (ref 3.5–5.1)
SODIUM: 139 mmol/L (ref 135–145)

## 2016-12-15 LAB — CBC
HCT: 34.4 % — ABNORMAL LOW (ref 39.0–52.0)
HEMOGLOBIN: 11.7 g/dL — AB (ref 13.0–17.0)
MCH: 29.9 pg (ref 26.0–34.0)
MCHC: 34 g/dL (ref 30.0–36.0)
MCV: 88 fL (ref 78.0–100.0)
PLATELETS: 142 10*3/uL — AB (ref 150–400)
RBC: 3.91 MIL/uL — ABNORMAL LOW (ref 4.22–5.81)
RDW: 13.1 % (ref 11.5–15.5)
WBC: 4.2 10*3/uL (ref 4.0–10.5)

## 2016-12-15 LAB — HEMOGLOBIN A1C
HEMOGLOBIN A1C: 5.9 % — AB (ref 4.8–5.6)
Mean Plasma Glucose: 123 mg/dL

## 2016-12-15 LAB — GLUCOSE, CAPILLARY
GLUCOSE-CAPILLARY: 95 mg/dL (ref 65–99)
GLUCOSE-CAPILLARY: 93 mg/dL (ref 65–99)
Glucose-Capillary: 92 mg/dL (ref 65–99)
Glucose-Capillary: 88 mg/dL (ref 65–99)

## 2016-12-15 LAB — VITAMIN D 25 HYDROXY (VIT D DEFICIENCY, FRACTURES): Vit D, 25-Hydroxy: 24.5 ng/mL — ABNORMAL LOW (ref 30.0–100.0)

## 2016-12-15 LAB — PHOSPHORUS: Phosphorus: 2.4 mg/dL — ABNORMAL LOW (ref 2.5–4.6)

## 2016-12-15 LAB — SEDIMENTATION RATE: Sed Rate: 95 mm/hr — ABNORMAL HIGH (ref 0–16)

## 2016-12-15 LAB — ALBUMIN: Albumin: 2.6 g/dL — ABNORMAL LOW (ref 3.5–5.0)

## 2016-12-15 NOTE — Progress Notes (Signed)
Dementia panel panel apparently mistaken ordered as drug screen which is negative will repeat dementia panel today creatinine much improved 1.38 patient alert talkative MCKOY BHAKTA [id number] DOB: August 22, 1946 DOA: 12/13/2016 PCP: Alonza Bogus, MD   Physical Exam: Blood pressure 130/68, pulse 73, temperature 97.7 F (36.5 C), temperature source Oral, resp. rate 20, height '5\' 2"'$  (1.575 m), weight 53.6 kg (118 lb 1.6 oz), SpO2 100 %. Lungs clear to A&P no rales wheeze rhonchi heart regular rhythm no S3-S4 no heaves thrills rubs abdomen soft nontender bowel sounds normoactive   Investigations:  Recent Results (from the past 240 hour(s))  Culture, blood (Routine X 2) w Reflex to ID Panel     Status: None (Preliminary result)   Collection Time: 12/13/16  7:52 PM  Result Value Ref Range Status   Specimen Description BLOOD RIGHT ARM  Final   Special Requests BOTTLES DRAWN AEROBIC ONLY 10CC  Final   Culture PENDING  Incomplete   Report Status PENDING  Incomplete  Culture, blood (Routine X 2) w Reflex to ID Panel     Status: None (Preliminary result)   Collection Time: 12/13/16  8:02 PM  Result Value Ref Range Status   Specimen Description BLOOD RIGHT HAND  Final   Special Requests BOTTLES DRAWN AEROBIC ONLY 10CC  Final   Culture NO GROWTH 2 DAYS  Final   Report Status PENDING  Incomplete     Basic Metabolic Panel:  Recent Labs  12/14/16 0433 12/15/16 0605  NA 137 139  K 4.2 3.6  CL 106 110  CO2 23 22  GLUCOSE 100* 97  BUN 46* 28*  CREATININE 2.33* 1.38*  CALCIUM 8.7* 8.4*  PHOS  --  2.4*   Liver Function Tests:  Recent Labs  12/13/16 1857 12/15/16 0605  AST 18  --   ALT 11*  --   ALKPHOS 46  --   BILITOT 0.8  --   PROT 8.3*  --   ALBUMIN 3.4* 2.6*     CBC:  Recent Labs  12/13/16 1857 12/14/16 0433 12/14/16 0956  WBC 7.5 6.6 5.5  NEUTROABS 3.7  --   --   HGB 13.7 11.2* 12.0*  HCT 39.3 32.7* 35.0*  MCV 87.5 88.4 88.4  PLT 188 163 147*    US  Renal  Result Date: 12/14/2016 CLINICAL DATA:  Acute tubular necrosis, proteinuria a EXAM: RENAL / URINARY TRACT ULTRASOUND COMPLETE COMPARISON:  None. FINDINGS: Right Kidney: Length: 5.8 cm.  Right kidney is atrophic without hydronephrosis. Left Kidney: Length: 10.9 cm. Echogenicity within normal limits. No mass or hydronephrosis visualized. Bladder: Appears normal for degree of bladder distention. Only left ureteral jet is visualized. IMPRESSION: 1. Atrophic right kidney without hydronephrosis measures 5.8 cm in length. 2. Normal size left kidney. No left hydronephrosis. Only left ureteral jet is visualized. The urinary bladder is unremarkable. Electronically Signed   By: Lahoma Crocker M.D.   On: 12/14/2016 10:45   Dg Chest Port 1 View  Result Date: 12/13/2016 CLINICAL DATA:  Acute onset of unstable gait.  Initial encounter. EXAM: PORTABLE CHEST 1 VIEW COMPARISON:  Chest radiograph performed 04/06/2013 FINDINGS: The lungs are well-aerated and clear. There is no evidence of focal opacification, pleural effusion or pneumothorax. The cardiomediastinal silhouette is within normal limits. No acute osseous abnormalities are seen. IMPRESSION: No acute cardiopulmonary process seen. Electronically Signed   By: Garald Balding M.D.   On: 12/13/2016 19:39      Medications:   Impression:  Principal Problem:  Acute kidney injury superimposed on chronic kidney disease ([place]) Active Problems:   Tobacco abuse   Acute renal failure (ARF) ([facility])   Unintentional weight loss   Alcohol dependence ([facility])   Hyperglycemia   Elevated troponin   Malnutrition of moderate degree     Plan: Dementia panel awaiting serum ammonia level  Consultants: Nephrology   Procedures renal ultrasound   Antibiotics:       Time spent: 30 minutes   LOS: 1 day   Jacques Fife M   12/15/2016, 11:18 AM

## 2016-12-15 NOTE — Progress Notes (Signed)
Subjective: Interval History: has no complaint of nausea or vomiting. Presently patient offers no complaints..  Objective: Vital signs in last 24 hours: Temp:  [97.7 F (36.5 C)-97.8 F (36.6 C)] 97.7 F (36.5 C) (03/31 0737) Pulse Rate:  [73-90] 73 (03/31 0737) Resp:  [20] 20 (03/31 0737) BP: (104-144)/(46-68) 130/68 (03/31 0737) SpO2:  [100 %] 100 % (03/31 0737) Weight change:   Intake/Output from previous day: 03/30 0701 - 03/31 0700 In: 2581.7 [P.O.:1200; I.V.:1381.7] Out: 600 [Urine:600] Intake/Output this shift: Total I/O In: 240 [P.O.:240] Out: -   General appearance: alert, cooperative, appears older than stated age and no distress Resp: clear to auscultation bilaterally Cardio: regular rate and rhythm Extremities: No edema  Lab Results:  Recent Labs  12/14/16 0433 12/14/16 0956  WBC 6.6 5.5  HGB 11.2* 12.0*  HCT 32.7* 35.0*  PLT 163 147*   BMET:  Recent Labs  12/14/16 0433 12/15/16 0605  NA 137 139  K 4.2 3.6  CL 106 110  CO2 23 22  GLUCOSE 100* 97  BUN 46* 28*  CREATININE 2.33* 1.38*  CALCIUM 8.7* 8.4*   No results for input(s): PTH in the last 72 hours. Iron Studies: No results for input(s): IRON, TIBC, TRANSFERRIN, FERRITIN in the last 72 hours.  Studies/Results: US Renal  Result Date: 12/14/2016 CLINICAL DATA:  Acute tubular necrosis, proteinuria a EXAM: RENAL / URINARY TRACT ULTRASOUND COMPLETE COMPARISON:  None. FINDINGS: Right Kidney: Length: 5.8 cm.  Right kidney is atrophic without hydronephrosis. Left Kidney: Length: 10.9 cm. Echogenicity within normal limits. No mass or hydronephrosis visualized. Bladder: Appears normal for degree of bladder distention. Only left ureteral jet is visualized. IMPRESSION: 1. Atrophic right kidney without hydronephrosis measures 5.8 cm in length. 2. Normal size left kidney. No left hydronephrosis. Only left ureteral jet is visualized. The urinary bladder is unremarkable. Electronically Signed   By: Lahoma Crocker M.D.   On: 12/14/2016 10:45   Dg Chest Port 1 View  Result Date: 12/13/2016 CLINICAL DATA:  Acute onset of unstable gait.  Initial encounter. EXAM: PORTABLE CHEST 1 VIEW COMPARISON:  Chest radiograph performed 04/06/2013 FINDINGS: The lungs are well-aerated and clear. There is no evidence of focal opacification, pleural effusion or pneumothorax. The cardiomediastinal silhouette is within normal limits. No acute osseous abnormalities are seen. IMPRESSION: No acute cardiopulmonary process seen. Electronically Signed   By: Garald Balding M.D.   On: 12/13/2016 19:39    I have reviewed the patient's current medications.  Assessment/Plan: Problem #1 acute kidney injury superimposed on chronic rate presently to be secondary to prerenal syndrome/ATN/ACE. Presently his creatinine has improved and returned to his baseline. Problem #2 chronic renal failure: Stage III. Thought to be secondary to hypertension/ischemic Problem #3 hypertension: His blood pressure is reasonably controlled Problem #4 history of liver cirrhosis Problem #5 history of GERD Problem #6 anemia: His hemoglobin is within her target goal. Plan: We'll DC IV fluid We'll encourage patient to increase his by mouth intake.   LOS: 1 day   Roxi Hlavaty S 12/15/2016,9:31 AM

## 2016-12-16 LAB — GLUCOSE, CAPILLARY
GLUCOSE-CAPILLARY: 97 mg/dL (ref 65–99)
Glucose-Capillary: 85 mg/dL (ref 65–99)
Glucose-Capillary: 117 mg/dL — ABNORMAL HIGH (ref 65–99)
Glucose-Capillary: 138 mg/dL — ABNORMAL HIGH (ref 65–99)

## 2016-12-16 LAB — URINE CULTURE

## 2016-12-16 LAB — RENAL FUNCTION PANEL
ALBUMIN: 2.7 g/dL — AB (ref 3.5–5.0)
Anion gap: 8 (ref 5–15)
BUN: 17 mg/dL (ref 6–20)
CO2: 22 mmol/L (ref 22–32)
Calcium: 8.6 mg/dL — ABNORMAL LOW (ref 8.9–10.3)
Chloride: 107 mmol/L (ref 101–111)
Creatinine, Ser: 1.09 mg/dL (ref 0.61–1.24)
GFR calc Af Amer: >60 mL/min (ref >60)
GFR calc non Af Amer: >60 mL/min (ref >60)
GLUCOSE: 83 mg/dL (ref 65–99)
PHOSPHORUS: 2.6 mg/dL (ref 2.5–4.6)
Potassium: 3.7 mmol/L (ref 3.5–5.1)
SODIUM: 137 mmol/L (ref 135–145)

## 2016-12-16 LAB — BASIC METABOLIC PANEL
Anion gap: 5 (ref 5–15)
BUN: 17 mg/dL (ref 6–20)
CALCIUM: 8.4 mg/dL — AB (ref 8.9–10.3)
CO2: 23 mmol/L (ref 22–32)
CREATININE: 1.11 mg/dL (ref 0.61–1.24)
Chloride: 107 mmol/L (ref 101–111)
GFR calc non Af Amer: >60 mL/min (ref >60)
Glucose, Bld: 85 mg/dL (ref 65–99)
Potassium: 3.6 mmol/L (ref 3.5–5.1)
SODIUM: 135 mmol/L (ref 135–145)

## 2016-12-16 LAB — PTH, INTACT AND CALCIUM
Calcium, Total (PTH): 8.1 mg/dL — ABNORMAL LOW (ref 8.6–10.2)
PTH: 16 pg/mL (ref 15–65)

## 2016-12-16 LAB — HIV ANTIBODY (ROUTINE TESTING W REFLEX): HIV SCREEN 4TH GENERATION: NONREACTIVE

## 2016-12-16 LAB — RPR, QUANT+TP ABS (REFLEX)
Rapid Plasma Reagin, Quant: 1:2 {titer} — ABNORMAL HIGH
TREPONEMA PALLIDUM AB: POSITIVE — AB
TREPONEMA PALLIDUM AB: POSITIVE — AB

## 2016-12-16 LAB — RPR
RPR Ser Ql: REACTIVE — AB
RPR: REACTIVE — AB

## 2016-12-16 NOTE — Progress Notes (Signed)
Patient was sleeping, then got up and ambulated out in hallway, looking for bathroom. Patient was redirected back to his own bathroom in his room.

## 2016-12-16 NOTE — Progress Notes (Signed)
Renal function much improved patient more alert protocol and family's for discharge they have agreed. The morning [name] [id number] DOB: October 16, 1945 DOA: 12/13/2016 PCP: Alexander Bogus, MD   Physical Exam: Blood pressure (!) 111/54, pulse 76, temperature 98.2 F (36.8 C), temperature source Oral, resp. rate 20, height '5\' 2"'$  (1.575 m), weight 53.6 kg (118 lb 1.6 oz), SpO2 100 %. Lungs clear to A&P no rales wheeze rhonchi heart regular rhythm no murmurs goes heaves thrills rubs abdomen soft nontender bowel sounds normoactive   Investigations:  Recent Results (from the past 240 hour(s))  Culture, blood (Routine X 2) w Reflex to ID Panel     Status: None (Preliminary result)   Collection Time: 12/13/16  7:52 PM  Result Value Ref Range Status   Specimen Description BLOOD RIGHT ARM  Final   Special Requests BOTTLES DRAWN AEROBIC ONLY 10CC  Final   Culture PENDING  Incomplete   Report Status PENDING  Incomplete  Culture, blood (Routine X 2) w Reflex to ID Panel     Status: None (Preliminary result)   Collection Time: 12/13/16  8:02 PM  Result Value Ref Range Status   Specimen Description BLOOD RIGHT HAND  Final   Special Requests BOTTLES DRAWN AEROBIC ONLY 10CC  Final   Culture NO GROWTH 3 DAYS  Final   Report Status PENDING  Incomplete  Urine culture     Status: Abnormal   Collection Time: 12/14/16 12:45 PM  Result Value Ref Range Status   Specimen Description URINE, CLEAN CATCH  Final   Special Requests NONE  Final   Culture MULTIPLE SPECIES PRESENT, SUGGEST RECOLLECTION (A)  Final   Report Status 12/16/2016 FINAL  Final     Basic Metabolic Panel:  Recent Labs  12/15/16 0605 12/16/16 0638  NA 139 137  135  K 3.6 3.7  3.6  CL 110 107  107  CO2 '22 22  23  '$ GLUCOSE 97 83  85  BUN 28* 17  17  CREATININE 1.38* 1.09  1.11  CALCIUM 8.4* 8.6*  8.4*  PHOS 2.4* 2.6   Liver Function Tests:  Recent Labs  12/13/16 1857 12/15/16 0605 12/16/16 0638  AST 18   --   --   ALT 11*  --   --   ALKPHOS 46  --   --   BILITOT 0.8  --   --   PROT 8.3*  --   --   ALBUMIN 3.4* 2.6* 2.7*     CBC:  Recent Labs  12/13/16 1857  12/14/16 0956 12/15/16 1158  WBC 7.5  < > 5.5 4.2  NEUTROABS 3.7  --   --   --   HGB 13.7  < > 12.0* 11.7*  HCT 39.3  < > 35.0* 34.4*  MCV 87.5  < > 88.4 88.0  PLT 188  < > 147* 142*  < > = values in this interval not displayed.  No results found.    Medications:   Impression:  Principal Problem:   Acute kidney injury superimposed on chronic kidney disease ([facility]) Active Problems:   Tobacco abuse   Acute renal failure (ARF) ([facility])   Unintentional weight loss   Alcohol dependence ([facility])   Hyperglycemia   Elevated troponin   Malnutrition of moderate degree     Plan: Continue to monitor electrolytes renal function discharge in a.m.  Consultants: Renal   Procedures renal ultrasound   Antibiotics:         Time spent: 30 minutes  LOS: 2 days   [name] M   12/16/2016, 1:01 PM

## 2016-12-16 NOTE — Progress Notes (Signed)
Subjective: Interval History: Patient feels good in'). he denies any dizziness or lightheadedness.  Objective: Vital signs in last 24 hours: Temp:  [98.1 F (36.7 C)-98.2 F (36.8 C)] 98.2 F (36.8 C) (04/01 0511) Pulse Rate:  [76-88] 76 (04/01 0511) Resp:  [20] 20 (04/01 0511) BP: (109-121)/(54-66) 111/54 (04/01 0511) SpO2:  [100 %] 100 % (04/01 0511) Weight change:   Intake/Output from previous day: 03/31 0701 - 04/01 0700 In: 843 [P.O.:840; I.V.:3] Out: -  Intake/Output this shift: No intake/output data recorded.  General appearance: alert, cooperative, appears older than stated age and no distress Resp: clear to auscultation bilaterally Cardio: regular rate and rhythm Extremities: No edema  Lab Results:  Recent Labs  12/14/16 0956 12/15/16 1158  WBC 5.5 4.2  HGB 12.0* 11.7*  HCT 35.0* 34.4*  PLT 147* 142*   BMET:   Recent Labs  12/15/16 0605 12/16/16 0638  NA 139 135  K 3.6 3.6  CL 110 107  CO2 22 23  GLUCOSE 97 85  BUN 28* 17  CREATININE 1.38* 1.11  CALCIUM 8.4* 8.4*   No results for input(s): PTH in the last 72 hours. Iron Studies: No results for input(s): IRON, TIBC, TRANSFERRIN, FERRITIN in the last 72 hours.  Studies/Results: US Renal  Result Date: 12/14/2016 CLINICAL DATA:  Acute tubular necrosis, proteinuria a EXAM: RENAL / URINARY TRACT ULTRASOUND COMPLETE COMPARISON:  None. FINDINGS: Right Kidney: Length: 5.8 cm.  Right kidney is atrophic without hydronephrosis. Left Kidney: Length: 10.9 cm. Echogenicity within normal limits. No mass or hydronephrosis visualized. Bladder: Appears normal for degree of bladder distention. Only left ureteral jet is visualized. IMPRESSION: 1. Atrophic right kidney without hydronephrosis measures 5.8 cm in length. 2. Normal size left kidney. No left hydronephrosis. Only left ureteral jet is visualized. The urinary bladder is unremarkable. Electronically Signed   By: Lahoma Crocker M.D.   On: 12/14/2016 10:45    I have  reviewed the patient's current medications.  Assessment/Plan: Problem #1 acute kidney injury superimposed on chronic rate presently to be secondary to prerenal syndrome/ATN/ACE. His renal function has recovered. Problem #2 chronic renal failure: Stage III. Thought to be secondary to hypertension/ischemic Problem #3 hypertension: His blood pressure is reasonably controlled Problem #4 history of liver cirrhosis: Presently patient doesn't have any edema or ascites. Patient is non-oliguric. Problem #5 history of GERD Problem #6 anemia: His hemoglobin is within her target goal. Plan: Since his renal function has recovered I will sign off. Thank you for letting me participate in his care.   LOS: 2 days   Ramla Hase S 12/16/2016,9:05 AM

## 2016-12-17 LAB — BASIC METABOLIC PANEL
Anion gap: 6 (ref 5–15)
BUN: 16 mg/dL (ref 6–20)
CALCIUM: 8.4 mg/dL — AB (ref 8.9–10.3)
CO2: 26 mmol/L (ref 22–32)
CREATININE: 1.21 mg/dL (ref 0.61–1.24)
Chloride: 103 mmol/L (ref 101–111)
GFR calc non Af Amer: 58 mL/min — ABNORMAL LOW (ref >60)
Glucose, Bld: 83 mg/dL (ref 65–99)
Potassium: 3.9 mmol/L (ref 3.5–5.1)
SODIUM: 135 mmol/L (ref 135–145)

## 2016-12-17 LAB — GLUCOSE, CAPILLARY: GLUCOSE-CAPILLARY: 96 mg/dL (ref 65–99)

## 2016-12-17 MED ORDER — DOCUSATE SODIUM 100 MG PO CAPS: 100.0000 mg | ORAL_CAPSULE | Freq: Two times a day (BID) | ORAL | 0 refills | Status: DC

## 2016-12-17 MED ORDER — ENSURE ENLIVE PO LIQD: 237.0000 mL | Freq: Two times a day (BID) | ORAL | 12 refills | Status: DC

## 2016-12-17 MED ORDER — FOLIC ACID 1 MG PO TABS: 1.0000 mg | ORAL_TABLET | Freq: Every day | ORAL | 3 refills | Status: DC

## 2016-12-17 NOTE — Care Management Note (Signed)
Case Management Note  Patient Details  Name: [name] MRN: [id number] Date of Birth: Sep 16, 1946  Expected Discharge Date:  12/17/16               Expected Discharge Plan:  Home/Self Care  In-House Referral:  NA  Discharge planning Services  CM Consult  Post Acute Care Choice:  NA Choice offered to:  NA  Status of Service:  Completed, signed off  Additional Comments: Pt discharged home today with self care. PT recommends OP PT. Referral made to [facility] as pt lives in [place].   Sherald Barge, RN 12/17/2016, 9:21 AM

## 2016-12-17 NOTE — Progress Notes (Signed)
Discharge instructions gone over with patient and family. Verbalized understanding. Removed IV. Patient tolerated procedure well.

## 2016-12-17 NOTE — Care Management Important Message (Signed)
Important Message  Patient Details  Name: [name] MRN: [id number] Date of Birth: 1946-01-28   Medicare Important Message Given:  Yes    Sherald Barge, RN 12/17/2016, 9:21 AM

## 2016-12-17 NOTE — Discharge Summary (Signed)
Physician Discharge Summary  [name]:[id number] DOB: 1946/02/03 DOA: 12/13/2016  PCP: Alonza Bogus, MD  Admit date: 12/13/2016 Discharge date: 12/17/2016   Recommendations for Outpatient Follow-up:  Patient is advised to avoid intake of alcohol and to attend AA meetings as an outpatient he's likewise advised to follow-up with his outpatient colonoscopy which was scheduled and he seemed to a missed to take all medicines as prescribed and follow-up with Dr. Luan Pulling within one to 2 weeks' time Discharge Diagnoses:  Principal Problem:   Acute kidney injury superimposed on chronic kidney disease ([place]) Active Problems:   Tobacco abuse   Acute renal failure (ARF) ([place])   Unintentional weight loss   Alcohol dependence ([place])   Hyperglycemia   Elevated troponin   Malnutrition of moderate degree   Discharge Condition:   Filed Weights   12/13/16 1842 12/13/16 2231  Weight: 51.8 kg (114 lb 4.8 oz) 53.6 kg (118 lb 1.6 oz)    History of present illness:  Patient is 71 year old white male lives with his sister he was admitted to the hospital with altered mental status thought to have been imbibing alcohol was likewise noted to be in acute on chronic renal failure with a creatinine greater than 3.5 on admission he was given C will pull protocol benzo diazepam he did well with no overt signs of withdrawal patient states he had not drank in years not certain if this is valid or not? Patient was seen in consultation by renal who gave fluid hydration renal ultrasound revealed atrophic right kidney with no evidence of hydronephrosis normal-appearing left kidney is creatinine returned to normal 1.1 to day before discharge she was subsequently discharged to follow-up with his primary care doctor Dr. Silvestre Moment Course:  See history of present illness above  Procedures:  Renal ultrasound  Consultations:  Renal  Discharge Instructions  Discharge Instructions    Discharge instructions    Complete by:  As directed    Discharge patient    Complete by:  As directed    Discharge disposition:  01-Home or Self Care   Discharge patient date:  12/17/2016     Allergies as of 12/17/2016   No Known Allergies     Medication List    TAKE these medications   ASPIRIN LOW DOSE 81 MG EC tablet Generic drug:  aspirin TAKE ONE TABLET BY MOUTH ONCE DAILY.   carvedilol 12.5 MG tablet Commonly known as:  COREG TAKE 1 TABLET BY MOUTH TWICE DAILY.   castor oil liquid Take 10 mLs by mouth daily as needed for moderate constipation.   DAILY VITE Tabs TAKE ONE TABLET BY MOUTH ONCE DAILY.   docusate sodium 100 MG capsule Commonly known as:  COLACE Take 1 capsule (100 mg total) by mouth 2 (two) times daily.   feeding supplement (ENSURE ENLIVE) Liqd Take 237 mLs by mouth 2 (two) times daily between meals.   folic acid 1 MG tablet Commonly known as:  FOLVITE Take 1 tablet (1 mg total) by mouth daily.   furosemide 40 MG tablet Commonly known as:  LASIX TAKE ONE TABLET BY MOUTH ONCE DAILY.   lisinopril 10 MG tablet Commonly known as:  PRINIVIL,ZESTRIL TAKE 1 TABLET BY MOUTH ONCE A DAY.   nitroGLYCERIN 0.4 MG SL tablet Commonly known as:  NITROSTAT Place 0.4 mg under the tongue as needed.   pantoprazole 40 MG tablet Commonly known as:  PROTONIX TAKE ONE TABLET BY MOUTH ONCE DAILY.  No Known Allergies    The results of significant diagnostics from this hospitalization (including imaging, microbiology, ancillary and laboratory) are listed below for reference.    Significant Diagnostic Studies: US Renal  Result Date: 12/14/2016 CLINICAL DATA:  Acute tubular necrosis, proteinuria a EXAM: RENAL / URINARY TRACT ULTRASOUND COMPLETE COMPARISON:  None. FINDINGS: Right Kidney: Length: 5.8 cm.  Right kidney is atrophic without hydronephrosis. Left Kidney: Length: 10.9 cm. Echogenicity within normal limits. No mass or hydronephrosis visualized. Bladder:  Appears normal for degree of bladder distention. Only left ureteral jet is visualized. IMPRESSION: 1. Atrophic right kidney without hydronephrosis measures 5.8 cm in length. 2. Normal size left kidney. No left hydronephrosis. Only left ureteral jet is visualized. The urinary bladder is unremarkable. Electronically Signed   By: Lahoma Crocker M.D.   On: 12/14/2016 10:45   Dg Chest Port 1 View  Result Date: 12/13/2016 CLINICAL DATA:  Acute onset of unstable gait.  Initial encounter. EXAM: PORTABLE CHEST 1 VIEW COMPARISON:  Chest radiograph performed 04/06/2013 FINDINGS: The lungs are well-aerated and clear. There is no evidence of focal opacification, pleural effusion or pneumothorax. The cardiomediastinal silhouette is within normal limits. No acute osseous abnormalities are seen. IMPRESSION: No acute cardiopulmonary process seen. Electronically Signed   By: Garald Balding M.D.   On: 12/13/2016 19:39    Microbiology: Recent Results (from the past 240 hour(s))  Culture, blood (Routine X 2) w Reflex to ID Panel     Status: None (Preliminary result)   Collection Time: 12/13/16  7:52 PM  Result Value Ref Range Status   Specimen Description BLOOD RIGHT ARM  Final   Special Requests BOTTLES DRAWN AEROBIC ONLY 10CC  Final   Culture PENDING  Incomplete   Report Status PENDING  Incomplete  Culture, blood (Routine X 2) w Reflex to ID Panel     Status: None (Preliminary result)   Collection Time: 12/13/16  8:02 PM  Result Value Ref Range Status   Specimen Description BLOOD RIGHT HAND  Final   Special Requests BOTTLES DRAWN AEROBIC ONLY 10CC  Final   Culture NO GROWTH 3 DAYS  Final   Report Status PENDING  Incomplete  Urine culture     Status: Abnormal   Collection Time: 12/14/16 12:45 PM  Result Value Ref Range Status   Specimen Description URINE, CLEAN CATCH  Final   Special Requests NONE  Final   Culture MULTIPLE SPECIES PRESENT, SUGGEST RECOLLECTION (A)  Final   Report Status 12/16/2016 FINAL  Final       Labs: Basic Metabolic Panel:  Recent Labs Lab 12/13/16 1857 12/14/16 0433 12/15/16 0605 12/16/16 0638 12/17/16 0429  NA 132* 137 139 137  135 135  K 4.9 4.2 3.6 3.7  3.6 3.9  CL 99* 106 110 107  107 103  CO2 '22 23 22 22  23 26  '$ GLUCOSE 142* 100* 97 83  85 83  BUN 48* 46* 28* '17  17 16  '$ CREATININE 3.04* 2.33* 1.38* 1.09  1.11 1.21  CALCIUM 9.7 8.7* 8.4*  8.1* 8.6*  8.4* 8.4*  PHOS  --   --  2.4* 2.6  --    Liver Function Tests:  Recent Labs Lab 12/13/16 1857 12/15/16 0605 12/16/16 0638  AST 18  --   --   ALT 11*  --   --   ALKPHOS 46  --   --   BILITOT 0.8  --   --   PROT 8.3*  --   --  ALBUMIN 3.4* 2.6* 2.7*    Recent Labs Lab 12/13/16 1857 12/14/16 1202  LIPASE 40  --   AMYLASE  --  104*    Recent Labs Lab 12/14/16 1206  AMMONIA <9*   CBC:  Recent Labs Lab 12/13/16 1857 12/14/16 0433 12/14/16 0956 12/15/16 1158  WBC 7.5 6.6 5.5 4.2  NEUTROABS 3.7  --   --   --   HGB 13.7 11.2* 12.0* 11.7*  HCT 39.3 32.7* 35.0* 34.4*  MCV 87.5 88.4 88.4 88.0  PLT 188 163 147* 142*   Cardiac Enzymes:  Recent Labs Lab 12/13/16 1855 12/13/16 2244 12/14/16 0433 12/14/16 0956  TROPONINI 0.04* 0.03* 0.03* 0.03*   BNP: BNP (last 3 results) No results for input(s): BNP in the last 8760 hours.  ProBNP (last 3 results) No results for input(s): PROBNP in the last 8760 hours.  CBG:  Recent Labs Lab 12/16/16 0729 12/16/16 1116 12/16/16 1625 12/16/16 2019 12/17/16 0759  GLUCAP 85 117* 97 138* 96       Signed:  [facility] Pager: [phone] 12/17/2016, 8:19 AM

## 2016-12-18 ENCOUNTER — Emergency Department (HOSPITAL_COMMUNITY): Payer: Medicare Other

## 2016-12-18 ENCOUNTER — Inpatient Hospital Stay (HOSPITAL_COMMUNITY)
Admission: EM | Admit: 2016-12-18 | Discharge: 2016-12-26 | DRG: 377 | Disposition: A | Discharge status: Home Health | Payer: Medicare Other | Attending: Internal Medicine | Admitting: Internal Medicine

## 2016-12-18 ENCOUNTER — Encounter (HOSPITAL_COMMUNITY): Payer: Self-pay | Admitting: Emergency Medicine

## 2016-12-18 DIAGNOSIS — Z79899 Other long term (current) drug therapy: Secondary | ICD-10-CM

## 2016-12-18 DIAGNOSIS — D649 Anemia, unspecified: Secondary | ICD-10-CM | POA: Diagnosis not present

## 2016-12-18 DIAGNOSIS — K922 Gastrointestinal hemorrhage, unspecified: Secondary | ICD-10-CM

## 2016-12-18 DIAGNOSIS — K259 Gastric ulcer, unspecified as acute or chronic, without hemorrhage or perforation: Secondary | ICD-10-CM | POA: Diagnosis present

## 2016-12-18 DIAGNOSIS — Z681 Body mass index (BMI) 19 or less, adult: Secondary | ICD-10-CM | POA: Diagnosis not present

## 2016-12-18 DIAGNOSIS — R578 Other shock: Secondary | ICD-10-CM | POA: Diagnosis not present

## 2016-12-18 DIAGNOSIS — R251 Tremor, unspecified: Secondary | ICD-10-CM | POA: Diagnosis not present

## 2016-12-18 DIAGNOSIS — R195 Other fecal abnormalities: Secondary | ICD-10-CM

## 2016-12-18 DIAGNOSIS — R404 Transient alteration of awareness: Secondary | ICD-10-CM | POA: Diagnosis not present

## 2016-12-18 DIAGNOSIS — K250 Acute gastric ulcer with hemorrhage: Secondary | ICD-10-CM | POA: Diagnosis not present

## 2016-12-18 DIAGNOSIS — K254 Chronic or unspecified gastric ulcer with hemorrhage: Principal | ICD-10-CM | POA: Diagnosis present

## 2016-12-18 DIAGNOSIS — I255 Ischemic cardiomyopathy: Secondary | ICD-10-CM | POA: Diagnosis present

## 2016-12-18 DIAGNOSIS — E440 Moderate protein-calorie malnutrition: Secondary | ICD-10-CM | POA: Diagnosis present

## 2016-12-18 DIAGNOSIS — F17210 Nicotine dependence, cigarettes, uncomplicated: Secondary | ICD-10-CM | POA: Diagnosis present

## 2016-12-18 DIAGNOSIS — D696 Thrombocytopenia, unspecified: Secondary | ICD-10-CM | POA: Diagnosis not present

## 2016-12-18 DIAGNOSIS — E785 Hyperlipidemia, unspecified: Secondary | ICD-10-CM | POA: Diagnosis not present

## 2016-12-18 DIAGNOSIS — K921 Melena: Secondary | ICD-10-CM | POA: Diagnosis not present

## 2016-12-18 DIAGNOSIS — K228 Other specified diseases of esophagus: Secondary | ICD-10-CM | POA: Diagnosis not present

## 2016-12-18 DIAGNOSIS — K7030 Alcoholic cirrhosis of liver without ascites: Secondary | ICD-10-CM | POA: Diagnosis present

## 2016-12-18 DIAGNOSIS — F1010 Alcohol abuse, uncomplicated: Secondary | ICD-10-CM | POA: Diagnosis present

## 2016-12-18 DIAGNOSIS — T39395A Adverse effect of other nonsteroidal anti-inflammatory drugs [NSAID], initial encounter: Secondary | ICD-10-CM | POA: Diagnosis present

## 2016-12-18 DIAGNOSIS — E861 Hypovolemia: Secondary | ICD-10-CM | POA: Diagnosis not present

## 2016-12-18 DIAGNOSIS — K449 Diaphragmatic hernia without obstruction or gangrene: Secondary | ICD-10-CM | POA: Diagnosis present

## 2016-12-18 DIAGNOSIS — E869 Volume depletion, unspecified: Secondary | ICD-10-CM | POA: Diagnosis present

## 2016-12-18 DIAGNOSIS — I429 Cardiomyopathy, unspecified: Secondary | ICD-10-CM | POA: Diagnosis not present

## 2016-12-18 DIAGNOSIS — N183 Chronic kidney disease, stage 3 (moderate): Secondary | ICD-10-CM | POA: Diagnosis not present

## 2016-12-18 DIAGNOSIS — N189 Chronic kidney disease, unspecified: Secondary | ICD-10-CM | POA: Diagnosis present

## 2016-12-18 DIAGNOSIS — R627 Adult failure to thrive: Secondary | ICD-10-CM

## 2016-12-18 DIAGNOSIS — Z7982 Long term (current) use of aspirin: Secondary | ICD-10-CM

## 2016-12-18 DIAGNOSIS — D62 Acute posthemorrhagic anemia: Secondary | ICD-10-CM | POA: Diagnosis not present

## 2016-12-18 DIAGNOSIS — N179 Acute kidney failure, unspecified: Secondary | ICD-10-CM | POA: Diagnosis not present

## 2016-12-18 DIAGNOSIS — I2510 Atherosclerotic heart disease of native coronary artery without angina pectoris: Secondary | ICD-10-CM | POA: Diagnosis present

## 2016-12-18 DIAGNOSIS — I9589 Other hypotension: Secondary | ICD-10-CM

## 2016-12-18 DIAGNOSIS — D509 Iron deficiency anemia, unspecified: Secondary | ICD-10-CM | POA: Diagnosis not present

## 2016-12-18 DIAGNOSIS — N184 Chronic kidney disease, stage 4 (severe): Secondary | ICD-10-CM | POA: Diagnosis present

## 2016-12-18 DIAGNOSIS — G9340 Encephalopathy, unspecified: Secondary | ICD-10-CM | POA: Diagnosis not present

## 2016-12-18 DIAGNOSIS — K2941 Chronic atrophic gastritis with bleeding: Secondary | ICD-10-CM | POA: Diagnosis not present

## 2016-12-18 DIAGNOSIS — K219 Gastro-esophageal reflux disease without esophagitis: Secondary | ICD-10-CM | POA: Diagnosis not present

## 2016-12-18 DIAGNOSIS — K2951 Unspecified chronic gastritis with bleeding: Secondary | ICD-10-CM | POA: Diagnosis not present

## 2016-12-18 DIAGNOSIS — I5022 Chronic systolic (congestive) heart failure: Secondary | ICD-10-CM | POA: Diagnosis present

## 2016-12-18 DIAGNOSIS — I130 Hypertensive heart and chronic kidney disease with heart failure and stage 1 through stage 4 chronic kidney disease, or unspecified chronic kidney disease: Secondary | ICD-10-CM | POA: Diagnosis present

## 2016-12-18 DIAGNOSIS — Q8789 Other specified congenital malformation syndromes, not elsewhere classified: Secondary | ICD-10-CM | POA: Diagnosis not present

## 2016-12-18 DIAGNOSIS — R0602 Shortness of breath: Secondary | ICD-10-CM | POA: Diagnosis not present

## 2016-12-18 DIAGNOSIS — I950 Idiopathic hypotension: Secondary | ICD-10-CM | POA: Diagnosis not present

## 2016-12-18 DIAGNOSIS — Z720 Tobacco use: Secondary | ICD-10-CM | POA: Diagnosis not present

## 2016-12-18 DIAGNOSIS — I959 Hypotension, unspecified: Secondary | ICD-10-CM | POA: Diagnosis present

## 2016-12-18 DIAGNOSIS — E860 Dehydration: Secondary | ICD-10-CM | POA: Diagnosis not present

## 2016-12-18 DIAGNOSIS — R531 Weakness: Secondary | ICD-10-CM | POA: Diagnosis not present

## 2016-12-18 DIAGNOSIS — K3189 Other diseases of stomach and duodenum: Secondary | ICD-10-CM | POA: Diagnosis not present

## 2016-12-18 DIAGNOSIS — Z955 Presence of coronary angioplasty implant and graft: Secondary | ICD-10-CM

## 2016-12-18 DIAGNOSIS — R05 Cough: Secondary | ICD-10-CM | POA: Diagnosis not present

## 2016-12-18 LAB — CULTURE, BLOOD (ROUTINE X 2)
Culture: NO GROWTH
Culture: NO GROWTH

## 2016-12-18 LAB — LACTIC ACID, PLASMA
LACTIC ACID, VENOUS: 1.5 mmol/L (ref 0.5–1.9)
Lactic Acid, Venous: 2.4 mmol/L (ref 0.5–1.9)

## 2016-12-18 LAB — CBC WITH DIFFERENTIAL/PLATELET
BASOS ABS: 0 10*3/uL (ref 0.0–0.1)
BASOS PCT: 0 %
Eosinophils Absolute: 0.1 10*3/uL (ref 0.0–0.7)
Eosinophils Relative: 2 %
HEMATOCRIT: 23.2 % — AB (ref 39.0–52.0)
HEMOGLOBIN: 8.1 g/dL — AB (ref 13.0–17.0)
Lymphocytes Relative: 33 %
Lymphs Abs: 1.8 10*3/uL (ref 0.7–4.0)
MCH: 30.8 pg (ref 26.0–34.0)
MCHC: 34.9 g/dL (ref 30.0–36.0)
MCV: 88.2 fL (ref 78.0–100.0)
Monocytes Absolute: 0.4 10*3/uL (ref 0.1–1.0)
Monocytes Relative: 8 %
NEUTROS ABS: 3 10*3/uL (ref 1.7–7.7)
NEUTROS PCT: 57 %
Platelets: 104 10*3/uL — ABNORMAL LOW (ref 150–400)
RBC: 2.63 MIL/uL — AB (ref 4.22–5.81)
RDW: 13.2 % (ref 11.5–15.5)
WBC: 5.3 10*3/uL (ref 4.0–10.5)

## 2016-12-18 LAB — COMPREHENSIVE METABOLIC PANEL
ALBUMIN: 2.2 g/dL — AB (ref 3.5–5.0)
ALK PHOS: 31 U/L — AB (ref 38–126)
ALT: 19 U/L (ref 17–63)
ANION GAP: 4 — AB (ref 5–15)
AST: 21 U/L (ref 15–41)
BUN: 58 mg/dL — ABNORMAL HIGH (ref 6–20)
CALCIUM: 8 mg/dL — AB (ref 8.9–10.3)
CO2: 25 mmol/L (ref 22–32)
Chloride: 108 mmol/L (ref 101–111)
Creatinine, Ser: 1.42 mg/dL — ABNORMAL HIGH (ref 0.61–1.24)
GFR calc Af Amer: 55 mL/min — ABNORMAL LOW (ref >60)
GFR calc non Af Amer: 48 mL/min — ABNORMAL LOW (ref >60)
GLUCOSE: 98 mg/dL (ref 65–99)
POTASSIUM: 5.2 mmol/L — AB (ref 3.5–5.1)
SODIUM: 137 mmol/L (ref 135–145)
Total Bilirubin: 0.3 mg/dL (ref 0.3–1.2)
Total Protein: 5.3 g/dL — ABNORMAL LOW (ref 6.5–8.1)

## 2016-12-18 LAB — URINALYSIS, ROUTINE W REFLEX MICROSCOPIC
Bilirubin Urine: NEGATIVE
GLUCOSE, UA: NEGATIVE mg/dL
Hgb urine dipstick: NEGATIVE
Ketones, ur: NEGATIVE mg/dL
Nitrite: NEGATIVE
PROTEIN: NEGATIVE mg/dL
Specific Gravity, Urine: 1.01 (ref 1.005–1.030)
pH: 5 (ref 5.0–8.0)

## 2016-12-18 LAB — LIPASE, BLOOD: Lipase: 50 U/L (ref 11–51)

## 2016-12-18 LAB — RAPID URINE DRUG SCREEN, HOSP PERFORMED
Amphetamines: NOT DETECTED
BARBITURATES: NOT DETECTED
BENZODIAZEPINES: NOT DETECTED
COCAINE: NOT DETECTED
Opiates: NOT DETECTED
TETRAHYDROCANNABINOL: NOT DETECTED

## 2016-12-18 LAB — POC OCCULT BLOOD, ED: Fecal Occult Bld: POSITIVE — AB

## 2016-12-18 LAB — HEMOGLOBIN AND HEMATOCRIT, BLOOD
HEMATOCRIT: 23 % — AB (ref 39.0–52.0)
Hemoglobin: 8 g/dL — ABNORMAL LOW (ref 13.0–17.0)

## 2016-12-18 LAB — ETHANOL: Alcohol, Ethyl (B): <5 mg/dL (ref <5)

## 2016-12-18 LAB — TROPONIN I: Troponin I: 0.05 ng/mL (ref <0.03)

## 2016-12-18 MED ORDER — ADULT MULTIVITAMIN W/MINERALS CH
1.0000 | ORAL_TABLET | Freq: Every day | ORAL | Status: DC
  Administered 2016-12-19 – 2016-12-20 (×2): 1 via ORAL
  Filled 2016-12-18 (×3): qty 1

## 2016-12-18 MED ORDER — CARVEDILOL 12.5 MG PO TABS
12.5000 mg | ORAL_TABLET | Freq: Two times a day (BID) | ORAL | Status: DC
  Administered 2016-12-19 – 2016-12-26 (×6): 12.5 mg via ORAL
  Filled 2016-12-18 (×14): qty 1

## 2016-12-18 MED ORDER — SODIUM CHLORIDE 0.9 % IV BOLUS (SEPSIS): 1000.0000 mL | Freq: Once | INTRAVENOUS | Status: DC

## 2016-12-18 MED ORDER — PANTOPRAZOLE SODIUM 40 MG PO TBEC
40.0000 mg | DELAYED_RELEASE_TABLET | Freq: Every day | ORAL | Status: DC
  Administered 2016-12-19: 40 mg via ORAL
  Filled 2016-12-18: qty 1

## 2016-12-18 MED ORDER — ONDANSETRON HCL 4 MG PO TABS: 4.0000 mg | ORAL_TABLET | Freq: Four times a day (QID) | ORAL | Status: DC | PRN

## 2016-12-18 MED ORDER — ONDANSETRON HCL 4 MG/2ML IJ SOLN: 4.0000 mg | Freq: Four times a day (QID) | INTRAMUSCULAR | Status: DC | PRN

## 2016-12-18 MED ORDER — SODIUM CHLORIDE 0.9 % IV SOLN
INTRAVENOUS | Status: DC
  Administered 2016-12-18: 19:00:00 via INTRAVENOUS

## 2016-12-18 MED ORDER — FOLIC ACID 1 MG PO TABS
1.0000 mg | ORAL_TABLET | Freq: Every day | ORAL | Status: DC
  Administered 2016-12-19 – 2016-12-20 (×2): 1 mg via ORAL
  Filled 2016-12-18 (×3): qty 1

## 2016-12-18 MED ORDER — DOCUSATE SODIUM 100 MG PO CAPS
100.0000 mg | ORAL_CAPSULE | Freq: Every day | ORAL | Status: DC
  Administered 2016-12-18 – 2016-12-20 (×3): 100 mg via ORAL
  Filled 2016-12-18 (×3): qty 1

## 2016-12-18 MED ORDER — SODIUM CHLORIDE 0.9 % IV SOLN
INTRAVENOUS | Status: DC
  Administered 2016-12-20 – 2016-12-24 (×4): via INTRAVENOUS

## 2016-12-18 MED ORDER — ACETAMINOPHEN 325 MG PO TABS: 650.0000 mg | ORAL_TABLET | Freq: Four times a day (QID) | ORAL | Status: DC | PRN

## 2016-12-18 MED ORDER — BISACODYL 10 MG RE SUPP: 10.0000 mg | Freq: Every day | RECTAL | Status: DC | PRN

## 2016-12-18 MED ORDER — SODIUM CHLORIDE 0.9 % IV BOLUS (SEPSIS)
1000.0000 mL | Freq: Once | INTRAVENOUS | Status: AC
  Administered 2016-12-18: 1000 mL via INTRAVENOUS

## 2016-12-18 MED ORDER — ACETAMINOPHEN 650 MG RE SUPP: 650.0000 mg | Freq: Four times a day (QID) | RECTAL | Status: DC | PRN

## 2016-12-18 NOTE — H&P (Signed)
[facility] History and Physical  [name] VWP:[id number] DOB: May 27, 1946 DOA: 12/18/2016  Referring physician: Dr Thurnell Garbe PCP: Alexander Bogus, [name]   Chief Complaint: Hypotension, gen'd weakness  HPI: [name] is a 71 y.o. male with history of CAD/ RCA stent in 2007, ICM EF 20-25%, HTN, CKD w nonfunct L kidney, "cirrhosis", hx prior etoh abuse and HL.  Patient was admitted here last week with vol depletion and acute/ CRF with creat of 3.5, improved to 1.1 and he was dc'd yesterday on 4/2 on lisinopril, lasix , coreg and asa.  Brought in today by sister.  She found him this morning weak and not eating.  Brought in by EMS, initial BP was 75/42.  Got 2 L of NS and now BP 110/ 70.  Asked to see for admission.    Patient feels "fine", not sure why he is here.  His sister gives him his medications at home.  No CP, cough, sob, no fever, no abdpain, no n/v/d, no voiding issues.     Grew up in [place], [place].  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  Divorced, one daughter, lives with his daughter and his sister in [place].  Used to drink alcohol, says he quit yrs ago.  Smokes cigarettes.     Old chart: Mar 29- Dec 17, 2016 > AMS, possible etoh related, acute / CRF w creat 3.5.  In hospital didn't show signs of etoh w/d.  Creat improved to 1.1 with IVF"s.  Renal US w atrophic nonfunctioning L kidney 5.8 cm, no ureteral jet on left.  DC'd on ACEi, lasix, coreg, asa, PPI.   May 2007 - acute CP/ acute STEMI inferior with CHB > went to cath lab had 99%RCA treated with stent. Also distal RCA 80&, LCx 60% and LAD 60%, OM 100%.  Had cardiogenic shock, required IABP, dopamine during cath.  Abd aortogram done at time of cath and L renal artery was 100% occluded.  Should avoid ACEi.  Had ICM with EF 20-25%.  Rx hydralazine, lasix , coreg.  Imdur.     ROS  denies CP  no joint pain   no HA  no blurry vision  no rash  no diarrhea  no nausea/ vomiting  no dysuria  no difficulty  voiding  no change in urine color    Past Medical History  Past Medical History:  Diagnosis Date  . Arteriosclerotic cardiovascular disease (ASCVD)    IMI in 5/07 with CTO mid cx, 60% LAD, 99% RCA-> BMS; mod. impaired LV function; EF:35-40% 2/10  . Chronic kidney disease    STAGE 3-4-creatinine 2.5 in 2009; 1.7 in 10/2008  . Cirrhosis ([place])    History of excessive alcohol use; continuing social use  . Gastroesophageal reflux disease   . Hyperlipidemia   . Tobacco abuse    60 pack years; one pack per day   Past Surgical History History reviewed. No pertinent surgical history. Family History History reviewed. No pertinent family history. Social History  reports that he has been smoking.  He has a 60.00 pack-year smoking history. He has never used smokeless tobacco. He reports that he drinks alcohol. He reports that he does not use drugs. Allergies No Known Allergies Home medications Prior to Admission medications   Medication Sig Start Date End Date Taking? Authorizing Provider  ASPIRIN LOW DOSE 81 MG EC tablet TAKE ONE TABLET BY MOUTH ONCE DAILY. 10/19/13  Yes Alexander Commons, [name]  carvedilol (COREG) 12.5 MG tablet TAKE  1 TABLET BY MOUTH TWICE DAILY. 07/07/14  Yes Alexander Commons, [name]  docusate sodium (COLACE) 100 MG capsule Take 1 capsule (100 mg total) by mouth 2 (two) times daily. Patient taking differently: Take 100 mg by mouth at bedtime.  12/17/16  Yes Alexander Gaskins, [name]  feeding supplement, ENSURE ENLIVE, (ENSURE ENLIVE) LIQD Take 237 mLs by mouth 2 (two) times daily between meals. 12/17/16  Yes Alexander Gaskins, [name]  folic acid (FOLVITE) 1 MG tablet Take 1 tablet (1 mg total) by mouth daily. 12/17/16  Yes Alexander Gaskins, [name]  furosemide (LASIX) 40 MG tablet TAKE ONE TABLET BY MOUTH ONCE DAILY. 01/18/14  Yes Alexander Colonel, [name]  lisinopril (PRINIVIL,ZESTRIL) 10 MG tablet TAKE 1 TABLET BY MOUTH ONCE A DAY. 07/07/14  Yes Alexander Commons, [name]  Multiple Vitamin (DAILY VITE) TABS  TAKE ONE TABLET BY MOUTH ONCE DAILY.   Yes Alexander Colonel, [name]  nitroGLYCERIN (NITROSTAT) 0.4 MG SL tablet Place 0.4 mg under the tongue as needed.     Yes Historical Provider, [name]  pantoprazole (PROTONIX) 40 MG tablet TAKE ONE TABLET BY MOUTH ONCE DAILY. 01/18/14  Yes Alexander Colonel, [name]  castor oil liquid Take 10 mLs by mouth daily as needed for moderate constipation.    Historical Provider, [name]   Liver Function Tests  Recent Labs Lab 12/13/16 1857 12/15/16 0605 12/16/16 0638 12/18/16 1721  AST 18  --   --  21  ALT 11*  --   --  19  ALKPHOS 46  --   --  31*  BILITOT 0.8  --   --  0.3  PROT 8.3*  --   --  5.3*  ALBUMIN 3.4* 2.6* 2.7* 2.2*    Recent Labs Lab 12/13/16 1857 12/14/16 1202 12/18/16 1721  LIPASE 40  --  50  AMYLASE  --  104*  --    CBC  Recent Labs Lab 12/13/16 1857  12/14/16 0956 12/15/16 1158 12/18/16 1716  WBC 7.5  < > 5.5 4.2 5.3  NEUTROABS 3.7  --   --   --  3.0  HGB 13.7  < > 12.0* 11.7* 8.1*  HCT 39.3  < > 35.0* 34.4* 23.2*  MCV 87.5  < > 88.4 88.0 88.2  PLT 188  < > 147* 142* 104*  < > = values in this interval not displayed. Basic Metabolic Panel  Recent Labs Lab 12/13/16 1857 12/14/16 0433 12/15/16 0605 12/16/16 0638 12/17/16 0429 12/18/16 1721  NA 132* 137 139 137  135 135 137  K 4.9 4.2 3.6 3.7  3.6 3.9 5.2*  CL 99* 106 110 107  107 103 108  CO2 '22 23 22 22  23 26 25  ' GLUCOSE 142* 100* 97 83  85 83 98  BUN 48* 46* 28* '17  17 16 ' 58*  CREATININE 3.04* 2.33* 1.38* 1.09  1.11 1.21 1.42*  CALCIUM 9.7 8.7* 8.4*  8.1* 8.6*  8.4* 8.4* 8.0*  PHOS  --   --  2.4* 2.6  --   --      Vitals:   12/18/16 1830 12/18/16 1900 12/18/16 1931 12/18/16 2000  BP: (!) 114/58 100/60 (!) 109/53 111/62  Pulse: 83 87 97 83  Resp: '14 16 20 17  ' Temp:   98.3 F (36.8 C)   TempSrc:   Oral   SpO2: 100% 100% 100% 100%   Exam: Gen thin AAM, no distress, pleasant No rash, cyanosis or gangrene Sclera anicteric, throat clear  No  jvd or  bruits Chest clear bilat RRR no MRG Abd soft ntnd no mass or ascites +bs GU normal male MS no joint effusions or deformity Ext no LE or UE edema / no wounds or ulcers Neuro is alert, Ox 3 , nf   Na 132  K 5.2  BUN 58  Cr 1.42  eGFR 48   Ca 8  Alb 2.2 LFT's ok   Trop 0.05    Lactic 1.5 > 2.4 WBC 5k HB 8.1 mcv 88   plt 104 UA > clear , rare bact, neg protein, 6-30 WBC's Fecal occult blood + UDS negative Renal US 12/14/16 > R kidney 5.8 cm and atrophic// L kidney 10.9 cm, no hydro, normal echo; bladder normal , only left ureteral jet is visualized  HOme meds > asa, coreg, colace, ensure, folvite, Lasix 40/d,  Lisinopril 10/d, MVI, SL NTG, PPI  EKG (independ reviewed) > Sinus tachycardia Multiple ventricular premature complexes LVH with secondary repolarization abnormality Anterior Q waves, possibly due to LVH When compared with ECG of 12/13/2016 (1) Rate faster, (2) Premature ventricular complexes are now present  CXR (independ reviewed) > no active disease  Head CT > 1.  No evidence of acute intracranial abnormality. 2. Mild chronic small vessel ischemia.   Assessment: 1. Hypotension - looks fine, not septic in appearance, asymptomatic,  trop neg and EKG at baseline (old ant/ inf MI).  Hb down with guiac+ stool, Hb was 11-13 last week. Suspect combination of vol depletion from diuretics, hypotension from BP meds and possible GI bleed.  Would avoid further ACEi with hx of RAS in this pt.  Admit, hold lasix, dc lisinopril. SP 2L bolus and bp's now normal.    Hx of ICM with low EF.  Use coreg only for now for HTN/ ICM, resume lasix if needed at lower dose.  2. CAD hx RCA stent/ ICM EF 20-25% 3. Anemia / quiac +stool - new diagnosis, Hb down 8.4 from 11 last week . Cont PPI, hold asa, serial H/H, type and screen, may need GI input 4. Hx prior etoh - question cirrhosis but no stigmata 5. HTN - bp's low here now 6. Hypovolemia - as above  7. CKD 3/4 - atrophic L kidney d/t  RAS 8. PYuria - urine cx sent, no fevers 9. Hx tobacco  Plan - as above     [place] [facility] Pager [phone]   If 7PM-7AM, please contact night-coverage [email or web address] Password [employer] 12/18/2016, 8:52 PM

## 2016-12-18 NOTE — ED Notes (Signed)
Patient transported to CT 

## 2016-12-18 NOTE — ED Triage Notes (Signed)
Patient brought in by EMS with sister at side. Per sister, patient was discharged yesterday from hospital. States patient woke this morning weak and not eating. Patient denies any pain at this time. B/P 75/42 in triage. Patient ambulatory with no assistance upon arrival to ER.

## 2016-12-18 NOTE — ED Provider Notes (Signed)
[facility] DEPT Provider Note   CSN: [id number] Arrival date & time: 12/18/16  1602     History   Chief Complaint Chief Complaint  Patient presents with  . Weakness    HPI [name] is a 71 y.o. male.  HPI  Pt was seen at 1705. Per pt and his family, c/o gradual onset and worsening of persistent generalized weakness that began yesterday after he was discharged from the hospital. Pt's family states pt "won't eat or drink anything" but "did drink a beer." Pt's family states pt's BP was "70" this morning. Denies CP/palpitations, no SOB/cough, no abd pain, no N/V/D, no back pain, no falls, no focal motor weakness, no tingling/numbness in extremities, no fevers, no falls.   Past Medical History:  Diagnosis Date  . Arteriosclerotic cardiovascular disease (ASCVD)    IMI in 5/07 with CTO mid cx, 60% LAD, 99% RCA-> BMS; mod. impaired LV function; EF:35-40% 2/10  . Chronic kidney disease    STAGE 3-4-creatinine 2.5 in 2009; 1.7 in 10/2008  . Cirrhosis ([place])    History of excessive alcohol use; continuing social use  . Gastroesophageal reflux disease   . Hyperlipidemia   . Tobacco abuse    60 pack years; one pack per day    Patient Active Problem List   Diagnosis Date Noted  . Malnutrition of moderate degree 12/15/2016  . Acute renal failure (ARF) ([place]) 12/13/2016  . Acute kidney injury superimposed on chronic kidney disease ([place]) 12/13/2016  . Unintentional weight loss 12/13/2016  . Alcohol dependence ([place]) 12/13/2016  . Hyperglycemia 12/13/2016  . Elevated troponin 12/13/2016  . Anemia, normocytic normochromic 03/28/2012  . Arteriosclerotic cardiovascular disease (ASCVD)   . Gastroesophageal reflux disease   . Chronic kidney disease   . Cirrhosis ([place])   . Tobacco abuse     History reviewed. No pertinent surgical history.     Home Medications    Prior to Admission medications   Medication Sig Start Date End Date Taking? Authorizing Provider  ASPIRIN LOW  DOSE 81 MG EC tablet TAKE ONE TABLET BY MOUTH ONCE DAILY. 10/19/13  Yes Herminio Commons, MD  carvedilol (COREG) 12.5 MG tablet TAKE 1 TABLET BY MOUTH TWICE DAILY. 07/07/14  Yes Herminio Commons, MD  docusate sodium (COLACE) 100 MG capsule Take 1 capsule (100 mg total) by mouth 2 (two) times daily. Patient taking differently: Take 100 mg by mouth at bedtime.  12/17/16  Yes [name], MD  feeding supplement, ENSURE ENLIVE, (ENSURE ENLIVE) LIQD Take 237 mLs by mouth 2 (two) times daily between meals. 12/17/16  Yes [name], MD  folic acid (FOLVITE) 1 MG tablet Take 1 tablet (1 mg total) by mouth daily. 12/17/16  Yes [name], MD  furosemide (LASIX) 40 MG tablet TAKE ONE TABLET BY MOUTH ONCE DAILY. 01/18/14  Yes Lendon Colonel, NP  lisinopril (PRINIVIL,ZESTRIL) 10 MG tablet TAKE 1 TABLET BY MOUTH ONCE A DAY. 07/07/14  Yes Herminio Commons, MD  Multiple Vitamin (DAILY VITE) TABS TAKE ONE TABLET BY MOUTH ONCE DAILY.   Yes Lendon Colonel, NP  nitroGLYCERIN (NITROSTAT) 0.4 MG SL tablet Place 0.4 mg under the tongue as needed.     Yes Historical Provider, MD  pantoprazole (PROTONIX) 40 MG tablet TAKE ONE TABLET BY MOUTH ONCE DAILY. 01/18/14  Yes Lendon Colonel, NP  castor oil liquid Take 10 mLs by mouth daily as needed for moderate constipation.    Historical Provider, MD    Family History  History reviewed. No pertinent family history.  Social History Social History  Substance Use Topics  . Smoking status: Current Every Day Smoker    Packs/day: 1.00    Years: 60.00  . Smokeless tobacco: Never Used  . Alcohol use 0.0 oz/week     Comment: per sister "he had one beer last week"     Allergies   Patient has no known allergies.   Review of Systems Review of Systems ROS: Statement: All systems negative except as marked or noted in the HPI; Constitutional: Negative for fever and chills. +generalized weakness/fatigue.; ; Eyes: Negative for eye pain, redness and  discharge. ; ; ENMT: Negative for ear pain, hoarseness, nasal congestion, sinus pressure and sore throat. ; ; Cardiovascular: Negative for chest pain, palpitations, diaphoresis, dyspnea and peripheral edema. ; ; Respiratory: Negative for cough, wheezing and stridor. ; ; Gastrointestinal: +poor PO intake. Negative for nausea, vomiting, diarrhea, abdominal pain, blood in stool, hematemesis, jaundice and rectal bleeding. . ; ; Genitourinary: Negative for dysuria, flank pain and hematuria. ; ; Musculoskeletal: Negative for back pain and neck pain. Negative for swelling and trauma.; ; Skin: Negative for pruritus, rash, abrasions, blisters, bruising and skin lesion.; ; Neuro: Negative for headache, lightheadedness and neck stiffness. Negative for atered level of consciousness, altered mental status, extremity weakness, paresthesias, involuntary movement, seizure and syncope.       Physical Exam Updated Vital Signs BP 110/62   Pulse 98   Temp 97.5 F (36.4 C) (Oral)   Resp 15   SpO2 100%    Patient Vitals for the past 24 hrs:  BP Temp Temp src Pulse Resp SpO2  12/18/16 1815 - - - 98 15 100 %  12/18/16 1800 110/62 - - 83 19 100 %  12/18/16 1730 (!) 114/56 - - 83 19 100 %  12/18/16 1725 - - - 82 15 100 %  12/18/16 1715 - - - 84 18 100 %  12/18/16 1700 (!) 96/53 - - 87 19 100 %  12/18/16 1650 - - - 94 20 100 %  12/18/16 1649 - - - 94 15 100 %  12/18/16 1647 (!) 82/49 - - (!) 102 - 100 %  12/18/16 1607 (!) 75/42 97.5 F (36.4 C) Oral (!) 111 18 100 %   19:28 Orthostatic Vital Signs LA  Orthostatic Lying   BP- Lying: 99/55  Pulse- Lying: 85      Orthostatic Sitting  BP- Sitting: 94/53  Pulse- Sitting: 95      Orthostatic Standing at 0 minutes  BP- Standing at 0 minutes:  79/50  Pulse- Standing at 0 minutes: 104     Physical Exam 1710: Physical examination:  Nursing notes reviewed; Vital signs and O2 SAT reviewed;  Constitutional: Thin, frail. In no acute distress; Head:   Normocephalic, atraumatic; Eyes: EOMI, PERRL, No scleral icterus; ENMT: Mouth and pharynx normal, Mucous membranes dry; Neck: Supple, Full range of motion, No lymphadenopathy; Cardiovascular: Tachycardic rate and rhythm, No gallop; Respiratory: Breath sounds clear & equal bilaterally, No wheezes. Normal respiratory effort/excursion; Chest: Nontender, Movement normal; Abdomen: Soft, Nontender, Nondistended, Normal bowel sounds; Genitourinary: No CVA tenderness; Extremities: Pulses normal, No tenderness, No edema, No calf edema or asymmetry.; Neuro: Awake, alert, falls asleep during HPI.  Major CN grossly intact. No facial droop. Speech clear. Moves all extremities spontaneously and to command without apparent gross focal motor deficits.; Skin: Color normal, Warm, Dry.   ED Treatments / Results  Labs (all labs ordered are listed, but only  abnormal results are displayed)   EKG  EKG Interpretation  Date/Time:  Tuesday December 18 2016 16:49:31 EDT Ventricular Rate:  94 PR Interval:    QRS Duration: 110 QT Interval:  342 QTC Calculation: 428 R Axis:   30 Text Interpretation:  Sinus tachycardia Multiple ventricular premature complexes LVH with secondary repolarization abnormality Anterior Q waves, possibly due to LVH When compared with ECG of 12/13/2016 Rate faster Premature ventricular complexes are now Present Confirmed by [facility]  MD, Nunzio Cory [phone]) on 12/18/2016 5:14:56 PM       Radiology   Procedures Procedures (including critical care time)  Medications Ordered in ED Medications  0.9 %  sodium chloride infusion ( Intravenous New Bag/Given 12/18/16 1830)  sodium chloride 0.9 % bolus 1,000 mL (1,000 mLs Intravenous New Bag/Given 12/18/16 1716)     Initial Impression / Assessment and Plan / ED Course  I have reviewed the triage vital signs and the nursing notes.  Pertinent labs & imaging results that were available during my care of the patient were reviewed by me and considered in my  medical decision making (see chart for details).  MDM Reviewed: previous chart, nursing note and vitals Reviewed previous: labs and ECG Interpretation: labs, ECG, x-ray and CT scan Total time providing critical care: 30-74 minutes. This excludes time spent performing separately reportable procedures and services. Consults: admitting MD   CRITICAL CARE Performed by: Alfonzo Feller Total critical care time: 35 minutes Critical care time was exclusive of separately billable procedures and treating other patients. Critical care was necessary to treat or prevent imminent or life-threatening deterioration. Critical care was time spent personally by me on the following activities: development of treatment plan with patient and/or surrogate as well as nursing, discussions with consultants, evaluation of patient's response to treatment, examination of patient, obtaining history from patient or surrogate, ordering and performing treatments and interventions, ordering and review of laboratory studies, ordering and review of radiographic studies, pulse oximetry and re-evaluation of patient's condition.   Results for orders placed or performed during the hospital encounter of 12/18/16  CBC with Differential  Result Value Ref Range   WBC 5.3 4.0 - 10.5 K/uL   RBC 2.63 (L) 4.22 - 5.81 MIL/uL   Hemoglobin 8.1 (L) 13.0 - 17.0 g/dL   HCT 23.2 (L) 39.0 - 52.0 %   MCV 88.2 78.0 - 100.0 fL   MCH 30.8 26.0 - 34.0 pg   MCHC 34.9 30.0 - 36.0 g/dL   RDW 13.2 11.5 - 15.5 %   Platelets 104 (L) 150 - 400 K/uL   Neutrophils Relative % 57 %   Neutro Abs 3.0 1.7 - 7.7 K/uL   Lymphocytes Relative 33 %   Lymphs Abs 1.8 0.7 - 4.0 K/uL   Monocytes Relative 8 %   Monocytes Absolute 0.4 0.1 - 1.0 K/uL   Eosinophils Relative 2 %   Eosinophils Absolute 0.1 0.0 - 0.7 K/uL   Basophils Relative 0 %   Basophils Absolute 0.0 0.0 - 0.1 K/uL  Ethanol  Result Value Ref Range   Alcohol, Ethyl (B) <5 <5 mg/dL  Lactic  acid, plasma  Result Value Ref Range   Lactic Acid, Venous 1.5 0.5 - 1.9 mmol/L  Lactic acid, plasma  Result Value Ref Range   Lactic Acid, Venous 2.4 (HH) 0.5 - 1.9 mmol/L  Urine rapid drug screen (hosp performed)  Result Value Ref Range   Opiates NONE DETECTED NONE DETECTED   Cocaine NONE DETECTED NONE DETECTED   Benzodiazepines  NONE DETECTED NONE DETECTED   Amphetamines NONE DETECTED NONE DETECTED   Tetrahydrocannabinol NONE DETECTED NONE DETECTED   Barbiturates NONE DETECTED NONE DETECTED  Urinalysis, Routine w reflex microscopic  Result Value Ref Range   Color, Urine STRAW (A) YELLOW   APPearance CLEAR CLEAR   Specific Gravity, Urine 1.010 1.005 - 1.030   pH 5.0 5.0 - 8.0   Glucose, UA NEGATIVE NEGATIVE mg/dL   Hgb urine dipstick NEGATIVE NEGATIVE   Bilirubin Urine NEGATIVE NEGATIVE   Ketones, ur NEGATIVE NEGATIVE mg/dL   Protein, ur NEGATIVE NEGATIVE mg/dL   Nitrite NEGATIVE NEGATIVE   Leukocytes, UA TRACE (A) NEGATIVE   RBC / HPF 0-5 0 - 5 RBC/hpf   WBC, UA 6-30 0 - 5 WBC/hpf   Bacteria, UA RARE (A) NONE SEEN   Squamous Epithelial / LPF 0-5 (A) NONE SEEN  Troponin I  Result Value Ref Range   Troponin I 0.05 (HH) <0.03 ng/mL  Lipase, blood  Result Value Ref Range   Lipase 50 11 - 51 U/L  Comprehensive metabolic panel  Result Value Ref Range   Sodium 137 135 - 145 mmol/L   Potassium 5.2 (H) 3.5 - 5.1 mmol/L   Chloride 108 101 - 111 mmol/L   CO2 25 22 - 32 mmol/L   Glucose, Bld 98 65 - 99 mg/dL   BUN 58 (H) 6 - 20 mg/dL   Creatinine, Ser 1.42 (H) 0.61 - 1.24 mg/dL   Calcium 8.0 (L) 8.9 - 10.3 mg/dL   Total Protein 5.3 (L) 6.5 - 8.1 g/dL   Albumin 2.2 (L) 3.5 - 5.0 g/dL   AST 21 15 - 41 U/L   ALT 19 17 - 63 U/L   Alkaline Phosphatase 31 (L) 38 - 126 U/L   Total Bilirubin 0.3 0.3 - 1.2 mg/dL   GFR calc non Af Amer 48 (L) >60 mL/min   GFR calc Af Amer 55 (L) >60 mL/min   Anion gap 4 (L) 5 - 15  Type and screen [facility]  Result Value Ref Range    ABO/RH(D) B POS    Antibody Screen NEG    Sample Expiration 12/21/2016    Ct Head Wo Contrast Result Date: 12/18/2016 CLINICAL DATA:  Weakness.  No reported injury. EXAM: CT HEAD WITHOUT CONTRAST TECHNIQUE: Contiguous axial images were obtained from the base of the skull through the vertex without intravenous contrast. COMPARISON:  None. FINDINGS: Brain: No evidence of parenchymal hemorrhage or extra-axial fluid collection. No mass lesion, mass effect, or midline shift. No CT evidence of acute infarction. Nonspecific mild subcortical and periventricular white matter hypodensity, most in keeping with chronic small vessel ischemic change. Cerebral volume is age appropriate. No ventriculomegaly. Vascular: Intracranial atherosclerosis.  No acute abnormality Skull: No evidence of calvarial fracture. Sinuses/Orbits: The visualized paranasal sinuses are essentially clear. Other:  The mastoid air cells are unopacified. IMPRESSION: 1.  No evidence of acute intracranial abnormality. 2. Mild chronic small vessel ischemia. Electronically Signed   By: Ilona Sorrel M.D.   On: 12/18/2016 18:31   Dg Chest Port 1 View Result Date: 12/18/2016 CLINICAL DATA:  Shortness of breath and cough EXAM: PORTABLE CHEST 1 VIEW COMPARISON:  12/13/2016 FINDINGS: No acute infiltrate or effusion. Stable cardiomediastinal silhouette with atherosclerosis. No pneumothorax. IMPRESSION: No active disease. Electronically Signed   By: Donavan Foil M.D.   On: 12/18/2016 19:06   Results for [name], [name] (MRN [id number]) as of 12/18/2016 19:56  Ref. Range 12/13/2016 18:57 12/14/2016 04:33  12/14/2016 09:56 12/15/2016 11:58 12/18/2016 17:16  Hemoglobin Latest Ref Range: 13.0 - 17.0 g/dL 13.7 11.2 (L) 12.0 (L) 11.7 (L) 8.1 (L)  HCT Latest Ref Range: 39.0 - 52.0 % 39.3 32.7 (L) 35.0 (L) 34.4 (L) 23.2 (L)  Platelets Latest Ref Range: 150 - 400 K/uL 188 163 147 (L) 142 (L) 104 (L)   Results for [name], [name] (MRN [id number]) as of 12/18/2016 19:56   Ref. Range 12/13/2016 18:55 12/13/2016 22:44 12/14/2016 04:33 12/14/2016 09:56 12/18/2016 17:21  Troponin I Latest Ref Range: <0.03 ng/mL 0.04 (HH) 0.03 (HH) 0.03 (HH) 0.03 (HH) 0.05 [facility])    Results for [name], [name] (MRN [id number]) as of 12/18/2016 19:56  Ref. Range 12/15/2016 06:05 12/16/2016 06:38 12/16/2016 06:38 12/17/2016 04:29 12/18/2016 17:21  BUN Latest Ref Range: 6 - 20 mg/dL 28 (H) '17 17 16 '$ 58 (H)  Creatinine Latest Ref Range: 0.61 - 1.24 mg/dL 1.38 (H) 1.11 1.09 1.21 1.42 (H)     1940:  Troponin appears elevated at baseline, but slightly higher today. Pt denies CP. H/H lower than previous; T&S ordered.  Rectal exam performed w/permission of pt and ED RN chaperone present.  Anal tone normal.  Non-tender, soft brown stool in rectal vault, heme positive.  No fissures, no external hemorrhoids, no palp masses. Pt given IVF boluses on arrival to ED with slow improvement in BP sitting, but orthostatic during VS. Pt now awake/alert, speaking full sentences, states he "doesn't know" why he is in the ED today.  Dx and testing d/w pt and family.  Questions answered.  Verb understanding, agreeable to admit. T/C to [facility] Dr. Jonnie Finner, case discussed, including:  HPI, pertinent PM/SHx, VS/PE, dx testing, ED course and treatment:  Agreeable to admit.    Final Clinical Impressions(s) / ED Diagnoses   Final diagnoses:  None    New Prescriptions New Prescriptions   No medications on file     Francine Graven, DO 12/20/16 1210

## 2016-12-18 NOTE — ED Notes (Signed)
CRITICAL VALUE ALERT  Critical value received:  Lactic acid 2.4  Date of notification:  12/18/2016  Time of notification:  1924  Critical value read back:Yes.    Nurse who received alert:  Fabio Neighbors RN  MD notified (1st page):  Dr. Thurnell Garbe  Time of first page:  1927  MD notified (2nd page):  Time of second page:  Responding MD:    Time MD responded:

## 2016-12-18 NOTE — ED Notes (Signed)
CRITICAL VALUE ALERT  Critical value received: Troponin 0.05  Date of notification:  12/18/16  Time of notification:  1907  Critical value read back:Yes.    Nurse who received alert:  Laurell Josephs RN  MD notified (1st page):  Thurnell Garbe  Time of first page:  1907  MD notified (2nd page):  Time of second page:  Responding MD:  Thurnell Garbe  Time MD responded:  [phone]

## 2016-12-18 NOTE — ED Notes (Signed)
Called to give report but nurse unable to take report and will call back for report

## 2016-12-19 ENCOUNTER — Inpatient Hospital Stay (HOSPITAL_COMMUNITY): Payer: Medicare Other

## 2016-12-19 ENCOUNTER — Encounter (HOSPITAL_COMMUNITY)
Admission: EM | Disposition: A | Discharge status: Home Health | Payer: Self-pay | Source: Home / Self Care | Attending: Pulmonary Disease

## 2016-12-19 ENCOUNTER — Encounter (HOSPITAL_COMMUNITY): Payer: Self-pay | Admitting: *Deleted

## 2016-12-19 DIAGNOSIS — I429 Cardiomyopathy, unspecified: Secondary | ICD-10-CM

## 2016-12-19 DIAGNOSIS — K3189 Other diseases of stomach and duodenum: Secondary | ICD-10-CM

## 2016-12-19 DIAGNOSIS — K449 Diaphragmatic hernia without obstruction or gangrene: Secondary | ICD-10-CM

## 2016-12-19 DIAGNOSIS — K259 Gastric ulcer, unspecified as acute or chronic, without hemorrhage or perforation: Secondary | ICD-10-CM

## 2016-12-19 DIAGNOSIS — K228 Other specified diseases of esophagus: Secondary | ICD-10-CM

## 2016-12-19 DIAGNOSIS — D62 Acute posthemorrhagic anemia: Secondary | ICD-10-CM

## 2016-12-19 DIAGNOSIS — K922 Gastrointestinal hemorrhage, unspecified: Secondary | ICD-10-CM

## 2016-12-19 HISTORY — PX: ESOPHAGOGASTRODUODENOSCOPY: SHX5428

## 2016-12-19 LAB — BASIC METABOLIC PANEL
Anion gap: 6 (ref 5–15)
BUN: 56 mg/dL — ABNORMAL HIGH (ref 6–20)
CHLORIDE: 109 mmol/L (ref 101–111)
CO2: 24 mmol/L (ref 22–32)
Calcium: 8.1 mg/dL — ABNORMAL LOW (ref 8.9–10.3)
Creatinine, Ser: 1.22 mg/dL (ref 0.61–1.24)
GFR calc non Af Amer: 57 mL/min — ABNORMAL LOW (ref >60)
GLUCOSE: 92 mg/dL (ref 65–99)
POTASSIUM: 4 mmol/L (ref 3.5–5.1)
Sodium: 139 mmol/L (ref 135–145)

## 2016-12-19 LAB — HEMOGLOBIN AND HEMATOCRIT, BLOOD
HEMATOCRIT: 22.6 % — AB (ref 39.0–52.0)
HEMOGLOBIN: 7.9 g/dL — AB (ref 13.0–17.0)

## 2016-12-19 LAB — PROTIME-INR
INR: 1.08
Prothrombin Time: 14 seconds (ref 11.4–15.2)

## 2016-12-19 LAB — ABO/RH: ABO/RH(D): B POS

## 2016-12-19 LAB — ECHOCARDIOGRAM COMPLETE
HEIGHTINCHES: 62 in
WEIGHTICAEL: 1896 [oz_av]

## 2016-12-19 SURGERY — EGD (ESOPHAGOGASTRODUODENOSCOPY)
Anesthesia: Moderate Sedation

## 2016-12-19 MED ORDER — SODIUM CHLORIDE 0.9 % IV SOLN: INTRAVENOUS | Status: DC

## 2016-12-19 MED ORDER — MEPERIDINE HCL 50 MG/ML IJ SOLN
INTRAMUSCULAR | Status: AC
Start: 2016-12-19 — End: 2016-12-20
  Filled 2016-12-19: qty 1

## 2016-12-19 MED ORDER — BOOST / RESOURCE BREEZE PO LIQD
1.0000 | Freq: Three times a day (TID) | ORAL | Status: DC
  Administered 2016-12-19 – 2016-12-20 (×5): 1 via ORAL

## 2016-12-19 MED ORDER — MIDAZOLAM HCL 5 MG/5ML IJ SOLN
INTRAMUSCULAR | Status: DC | PRN
  Administered 2016-12-19 (×3): 1 mg via INTRAVENOUS

## 2016-12-19 MED ORDER — ACETAMINOPHEN 325 MG PO TABS
650.0000 mg | ORAL_TABLET | Freq: Once | ORAL | Status: AC
  Administered 2016-12-19: 650 mg via ORAL
  Filled 2016-12-19: qty 2

## 2016-12-19 MED ORDER — SODIUM CHLORIDE 0.9 % IV SOLN: Freq: Once | INTRAVENOUS | Status: AC

## 2016-12-19 MED ORDER — STERILE WATER FOR IRRIGATION IR SOLN
Status: DC | PRN
  Administered 2016-12-19: 4 mL

## 2016-12-19 MED ORDER — PANTOPRAZOLE SODIUM 40 MG IV SOLR
40.0000 mg | Freq: Two times a day (BID) | INTRAVENOUS | Status: DC
  Administered 2016-12-19 – 2016-12-20 (×2): 40 mg via INTRAVENOUS
  Filled 2016-12-19 (×2): qty 40

## 2016-12-19 MED ORDER — LIDOCAINE VISCOUS 2 % MT SOLN
OROMUCOSAL | Status: DC | PRN
  Administered 2016-12-19: 5 mL via OROMUCOSAL

## 2016-12-19 MED ORDER — DIPHENHYDRAMINE HCL 25 MG PO CAPS
25.0000 mg | ORAL_CAPSULE | Freq: Once | ORAL | Status: AC
  Administered 2016-12-19: 25 mg via ORAL
  Filled 2016-12-19: qty 1

## 2016-12-19 MED ORDER — MIDAZOLAM HCL 5 MG/5ML IJ SOLN
INTRAMUSCULAR | Status: AC
  Filled 2016-12-19: qty 10

## 2016-12-19 MED ORDER — LIDOCAINE VISCOUS 2 % MT SOLN
OROMUCOSAL | Status: AC
  Filled 2016-12-19: qty 15

## 2016-12-19 MED ORDER — SUCRALFATE 1 GM/10ML PO SUSP
1.0000 g | Freq: Three times a day (TID) | ORAL | Status: DC
Start: 2016-12-19 — End: 2016-12-26
  Administered 2016-12-19 – 2016-12-26 (×25): 1 g via ORAL
  Filled 2016-12-19 (×28): qty 10

## 2016-12-19 MED ORDER — MEPERIDINE HCL 50 MG/ML IJ SOLN
INTRAMUSCULAR | Status: DC | PRN
  Administered 2016-12-19: 25 mg via INTRAVENOUS

## 2016-12-19 NOTE — Progress Notes (Signed)
Nutrition Follow-up   INTERVENTION:  RD will follow for diet advancement. Food preferences obtained during visit however pt is NPO for procedure at this time.   NUTRITION DIAGNOSIS:   Malnutrition (moderate) related to social / environmental circumstances vs chronic illness (Cirrhosis/CKD vs ETOH abuse) as evidenced by  Loss of 15% bw in <9 months and moderate Fat/Muscle wasting  GOAL:    Pt to meet >/= 90% of their estimated nutrition needs    MONITOR:    Po intake, labs and wt trends   REASON FOR ASSESSMENT: Malnutrition Screen     ASSESSMENT:  71 y/o male with hx of  tobacco/etoh abuse, HLD, GERD, Cirrhosis, CKD3-4. He was hypotensive on admission and Hemoglobin is 7.9 today.   Patient is c/o liquid diet this morning. His cousin is here and says he has not been eating much at all (a few bites) since he was discharged recently. He was thoroughly assessed by RD on 3/29 and met criteria for moderate malnutrition.  The patient refuses oral supplements when offered  and family says don't bother to send that he will not consume. The patient has been made NPO for colonoscopy later today.   The patient has experienced significant weight loss over the 8 months as noted previously. His usual wt per hospital records 64-68 kg last July.   He has lost 17% in last 8+ months. Using current diagnostic criterion for malnutrition, 15% bw loss at 9 months would be clinically significant.  Expect that he is chronically undernourished given his hx of ETOH and tobacco abuse.  Physical exam: Severe wasting of clavicular musculature. Mild-mod wasting of temporalis, deltoids, interosseous. Moderate wasting of tricep fat folds and thorax.   Labs: reviewed.  Medications: Colace,Insulin, MVI with min, Thiamin, ppi, IVF   Recent Labs Lab 12/15/16 0605 12/16/16 0638 12/17/16 0429 12/18/16 1721 12/19/16 0518  NA 139 137  135 135 137 139  K 3.6 3.7  3.6 3.9 5.2* 4.0  CL 110 107  107 103 108 109   CO2 '22 22  23 26 25 24  ' BUN 28* '17  17 16 ' 58* 56*  CREATININE 1.38* 1.09  1.11 1.21 1.42* 1.22  CALCIUM 8.4*  8.1* 8.6*  8.4* 8.4* 8.0* 8.1*  PHOS 2.4* 2.6  --   --   --   GLUCOSE 97 83  85 83 98 92   Diet Order:  Diet NPO time specified  Skin:    intact  Last BM:    4/4   Height:  Ht Readings from Last 1 Encounters:  12/18/16 '5\' 2"'  (1.575 m)  Actual height??  Weight:  Wt Readings from Last 1 Encounters:  12/18/16 118 lb 8 oz (53.8 kg)   Wt Readings from Last 10 Encounters:  12/18/16 118 lb 8 oz (53.8 kg)  12/13/16 118 lb 1.6 oz (53.6 kg)  04/04/16 142 lb (64.4 kg)  04/06/13 151 lb (68.5 kg)  03/28/12 150 lb (68 kg)  11/30/10 147 lb (66.7 kg)  09/14/09 143 lb (64.9 kg)   Ideal Body Weight:   ???  BMI:  Pt reports ht is 5'7, which is also more consistent with past encounter measurements: Body mass index is 18.51 kg/m.  Estimated Nutritional Needs:  Kcal:   1782-1890 (33-35 kcal/kg) Protein:   38-43 gr (0.7-0.8 gr/ kg) CKD 3-4 Fluid:   1.6-1.7 liters daily  EDUCATION NEEDS: none identified -question pt ability to particiapte   Colman Cater MS,RD,CSG,LDN Office: [phone] Pager: [phone]

## 2016-12-19 NOTE — Op Note (Signed)
[facility] Patient Name: [name] Procedure Date: 12/19/2016 3:24 PM MRN: [id number] Date of Birth: [birth year] Attending MD: Hildred Laser , MD CSN: [id number] Age: 71 Admit Type: Inpatient Procedure:                Upper GI endoscopy Indications:              Acute post hemorrhagic anemia Providers:                Hildred Laser, MD, Gwynneth Albright RN, RN,                            Aram Candela Referring MD:             Jasper Loser. Luan Pulling, MD Medicines:                Lidocaine spray, Meperidine 25 mg IV, Midazolam 3                            mg IV Complications:            No immediate complications. Estimated Blood Loss:     Estimated blood loss was minimal. Procedure:                Pre-Anesthesia Assessment:                           - Prior to the procedure, a History and Physical                            was performed, and patient medications and                            allergies were reviewed. The patient's tolerance of                            previous anesthesia was also reviewed. The risks                            and benefits of the procedure and the sedation                            options and risks were discussed with the patient.                            All questions were answered, and informed consent                            was obtained. Prior Anticoagulants: The patient                            last took aspirin 2 days prior to the procedure.                            ASA Grade Assessment: III - A patient with severe  systemic disease. After reviewing the risks and                            benefits, the patient was deemed in satisfactory                            condition to undergo the procedure.                           After obtaining informed consent, the endoscope was                            passed under direct vision. Throughout the                            procedure, the patient's  blood pressure, pulse, and                            oxygen saturations were monitored continuously. The                            [id number] ([id number]) scope was introduced through the                            mouth, and advanced to the second part of duodenum.                            The upper GI endoscopy was accomplished without                            difficulty. The patient tolerated the procedure                            well. Scope In: 3:55:29 PM Scope Out: 4:03:38 PM Total Procedure Duration: 0 hours 8 minutes 9 seconds  Findings:      The examined esophagus was normal.      The Z-line was irregular and was found 38 cm from the incisors.      A 2 cm hiatal hernia was present.      Diffuse mucosal changes characterized by granularity and nodularity were       found in the gastric fundus and in the gastric body. Biopsies were taken       with a cold forceps for histology. The pathology specimen was placed       into Bottle Number 2.      One non-bleeding cratered gastric ulcer with pigmented material was       found in the gastric body and on the lesser curvature of the gastric       body. The lesion was 30 mm in largest dimension. Biopsies were taken       with a cold forceps for histology. The pathology specimen was placed       into Bottle Number 1.      The duodenal bulb and second portion of the duodenum were normal. Impression:               -  Normal esophagus.                           - Z-line irregular, 38 cm from the incisors.                           - 2 cm hiatal hernia.                           - Granular and nodular mucosa in the gastric fundus                            and gastric body. Biopsied.                           - Non-bleeding gastric ulcer with pigmented                            material. Biopsied.                           - Normal duodenal bulb and second portion of the                            duodenum. Moderate Sedation:       Moderate (conscious) sedation was administered by the endoscopy nurse       and supervised by the endoscopist. The following parameters were       monitored: oxygen saturation, heart rate, blood pressure, CO2       capnography and response to care. Total physician intraservice time was       12 minutes. Recommendation:           - Patient has a contact number available for                            emergencies. The signs and symptoms of potential                            delayed complications were discussed with the                            patient. Return to normal activities tomorrow.                            Written discharge instructions were provided to the                            patient.                           - Return patient to hospital ward for ongoing care.                           - Cardiac diet today.                           -  Continue present medications.                           - Await pathology results.                           - Pantoprazole 40 mg IV every 12 hours.                           - Sucralfate 1 g by mouth before meals and qhs.                           - Repeat upper endoscopy in 12 weeks to check                            healing. Procedure Code(s):        --- Professional ---                           [id number], Esophagogastroduodenoscopy, flexible,                            transoral; with biopsy, single or multiple                           99152, Moderate sedation services provided by the                            same physician or other qualified health care                            professional performing the diagnostic or                            therapeutic service that the sedation supports,                            requiring the presence of an independent trained                            observer to assist in the monitoring of the                            patient's level of consciousness and physiological                             status; initial 15 minutes of intraservice time,                            patient age 9 years or older Diagnosis Code(s):        --- Professional ---                           K22.8, Other specified diseases of esophagus  K44.9, Diaphragmatic hernia without obstruction or                            gangrene                           K31.89, Other diseases of stomach and duodenum                           K25.9, Gastric ulcer, unspecified as acute or                            chronic, without hemorrhage or perforation                           D62, Acute posthemorrhagic anemia CPT copyright 2016 [employer]. All rights reserved. The codes documented in this report are preliminary and upon coder review may  be revised to meet current compliance requirements. Hildred Laser, MD Hildred Laser, MD 12/19/2016 4:21:51 PM This report has been signed electronically. Number of Addenda: 0

## 2016-12-19 NOTE — Progress Notes (Signed)
*  PRELIMINARY RESULTS* Echocardiogram 2D Echocardiogram has been performed.  [name] 12/19/2016, 2:18 PM

## 2016-12-19 NOTE — Progress Notes (Signed)
Subjective: He was admitted yesterday with hypotension. He did not appear to be septic. He has however dropped his hemoglobin by over 3 g since his admission last week. He has heme positive stool. He is known to have cardiac disease so he's going to need to have a blood transfusion. He will also need GI consultation  Objective: Vital signs in last 24 hours: Temp:  [97.5 F (36.4 C)-98.4 F (36.9 C)] 98.4 F (36.9 C) (04/04 0556) Pulse Rate:  [76-111] 84 (04/04 0556) Resp:  [11-22] 16 (04/04 0556) BP: (75-116)/(42-62) 110/61 (04/04 0556) SpO2:  [100 %] 100 % (04/04 0556) Weight:  [53.8 kg (118 lb 8 oz)] 53.8 kg (118 lb 8 oz) (04/03 2304) Weight change:  Last BM Date: 12/18/16  Intake/Output from previous day: 04/03 0701 - 04/04 0700 In: 1281.3 [I.V.:1281.3] Out: 1350 [Urine:1350]  PHYSICAL EXAM General appearance: alert, cooperative and no distress Resp: clear to auscultation bilaterally Cardio: regular rate and rhythm, S1, S2 normal, no murmur, click, rub or gallop GI: soft, non-tender; bowel sounds normal; no masses,  no organomegaly Extremities: extremities normal, atraumatic, no cyanosis or edema Skin warm and dry. Mucous membranes are moist  Lab Results:  Results for orders placed or performed during the hospital encounter of 12/18/16 (from the past 48 hour(s))  CBC with Differential     Status: Abnormal   Collection Time: 12/18/16  5:16 PM  Result Value Ref Range   WBC 5.3 4.0 - 10.5 K/uL   RBC 2.63 (L) 4.22 - 5.81 MIL/uL   Hemoglobin 8.1 (L) 13.0 - 17.0 g/dL   HCT 23.2 (L) 39.0 - 52.0 %   MCV 88.2 78.0 - 100.0 fL   MCH 30.8 26.0 - 34.0 pg   MCHC 34.9 30.0 - 36.0 g/dL   RDW 13.2 11.5 - 15.5 %   Platelets 104 (L) 150 - 400 K/uL    Comment: SPECIMEN CHECKED FOR CLOTS PLATELET COUNT CONFIRMED BY SMEAR    Neutrophils Relative % 57 %   Neutro Abs 3.0 1.7 - 7.7 K/uL   Lymphocytes Relative 33 %   Lymphs Abs 1.8 0.7 - 4.0 K/uL   Monocytes Relative 8 %   Monocytes  Absolute 0.4 0.1 - 1.0 K/uL   Eosinophils Relative 2 %   Eosinophils Absolute 0.1 0.0 - 0.7 K/uL   Basophils Relative 0 %   Basophils Absolute 0.0 0.0 - 0.1 K/uL  Ethanol     Status: None   Collection Time: 12/18/16  5:20 PM  Result Value Ref Range   Alcohol, Ethyl (B) <5 <5 mg/dL    Comment:        LOWEST DETECTABLE LIMIT FOR SERUM ALCOHOL IS 5 mg/dL FOR MEDICAL PURPOSES ONLY   Lactic acid, plasma     Status: None   Collection Time: 12/18/16  5:21 PM  Result Value Ref Range   Lactic Acid, Venous 1.5 0.5 - 1.9 mmol/L  Troponin I     Status: Abnormal   Collection Time: 12/18/16  5:21 PM  Result Value Ref Range   Troponin I 0.05 (HH) <0.03 ng/mL    Comment: CRITICAL RESULT CALLED TO, READ BACK BY AND VERIFIED WITH: CRUISE,M ON 12/18/16 AT 1855 BY LOY,C   Lipase, blood     Status: None   Collection Time: 12/18/16  5:21 PM  Result Value Ref Range   Lipase 50 11 - 51 U/L  Comprehensive metabolic panel     Status: Abnormal   Collection Time: 12/18/16  5:21 PM  Result Value Ref Range   Sodium 137 135 - 145 mmol/L   Potassium 5.2 (H) 3.5 - 5.1 mmol/L    Comment: DELTA CHECK NOTED   Chloride 108 101 - 111 mmol/L   CO2 25 22 - 32 mmol/L   Glucose, Bld 98 65 - 99 mg/dL   BUN 58 (H) 6 - 20 mg/dL   Creatinine, Ser 1.42 (H) 0.61 - 1.24 mg/dL   Calcium 8.0 (L) 8.9 - 10.3 mg/dL   Total Protein 5.3 (L) 6.5 - 8.1 g/dL   Albumin 2.2 (L) 3.5 - 5.0 g/dL   AST 21 15 - 41 U/L   ALT 19 17 - 63 U/L   Alkaline Phosphatase 31 (L) 38 - 126 U/L   Total Bilirubin 0.3 0.3 - 1.2 mg/dL   GFR calc non Af Amer 48 (L) >60 mL/min   GFR calc Af Amer 55 (L) >60 mL/min    Comment: (NOTE) The eGFR has been calculated using the CKD EPI equation. This calculation has not been validated in all clinical situations. eGFR's persistently <60 mL/min signify possible Chronic Kidney Disease.    Anion gap 4 (L) 5 - 15  Urine rapid drug screen (hosp performed)     Status: None   Collection Time: 12/18/16  6:27 PM   Result Value Ref Range   Opiates NONE DETECTED NONE DETECTED   Cocaine NONE DETECTED NONE DETECTED   Benzodiazepines NONE DETECTED NONE DETECTED   Amphetamines NONE DETECTED NONE DETECTED   Tetrahydrocannabinol NONE DETECTED NONE DETECTED   Barbiturates NONE DETECTED NONE DETECTED    Comment:        DRUG SCREEN FOR MEDICAL PURPOSES ONLY.  IF CONFIRMATION IS NEEDED FOR ANY PURPOSE, NOTIFY LAB WITHIN 5 DAYS.        LOWEST DETECTABLE LIMITS FOR URINE DRUG SCREEN Drug Class       Cutoff (ng/mL) Amphetamine      1000 Barbiturate      200 Benzodiazepine   782 Tricyclics       956 Opiates          300 Cocaine          300 THC              50   Urinalysis, Routine w reflex microscopic     Status: Abnormal   Collection Time: 12/18/16  6:27 PM  Result Value Ref Range   Color, Urine STRAW (A) YELLOW   APPearance CLEAR CLEAR   Specific Gravity, Urine 1.010 1.005 - 1.030   pH 5.0 5.0 - 8.0   Glucose, UA NEGATIVE NEGATIVE mg/dL   Hgb urine dipstick NEGATIVE NEGATIVE   Bilirubin Urine NEGATIVE NEGATIVE   Ketones, ur NEGATIVE NEGATIVE mg/dL   Protein, ur NEGATIVE NEGATIVE mg/dL   Nitrite NEGATIVE NEGATIVE   Leukocytes, UA TRACE (A) NEGATIVE   RBC / HPF 0-5 0 - 5 RBC/hpf   WBC, UA 6-30 0 - 5 WBC/hpf   Bacteria, UA RARE (A) NONE SEEN   Squamous Epithelial / LPF 0-5 (A) NONE SEEN  Type and screen [facility]     Status: None (Preliminary result)   Collection Time: 12/18/16  6:29 PM  Result Value Ref Range   ABO/RH(D) B POS    Antibody Screen NEG    Sample Expiration 12/21/2016    Unit Number [id number]    Blood Component Type RED CELLS,LR    Unit division 00    Status of Unit ALLOCATED    Transfusion Status  OK TO TRANSFUSE    Crossmatch Result Compatible    Unit Number [id number]    Blood Component Type RBC LR PHER1    Unit division 00    Status of Unit ALLOCATED    Transfusion Status OK TO TRANSFUSE    Crossmatch Result Compatible   Lactic acid, plasma      Status: Abnormal   Collection Time: 12/18/16  6:30 PM  Result Value Ref Range   Lactic Acid, Venous 2.4 (HH) 0.5 - 1.9 mmol/L    Comment: CRITICAL RESULT CALLED TO, READ BACK BY AND VERIFIED WITH: TALBET,T AT 1924 ON 4.3.2018 BY ISLEY,B   POC occult blood, ED     Status: Abnormal   Collection Time: 12/18/16  7:59 PM  Result Value Ref Range   Fecal Occult Bld POSITIVE (A) NEGATIVE  ABO/Rh     Status: None   Collection Time: 12/18/16 10:48 PM  Result Value Ref Range   ABO/RH(D) B POS   Prepare RBC     Status: None   Collection Time: 12/18/16 10:48 PM  Result Value Ref Range   Order Confirmation ORDER PROCESSED BY BLOOD BANK   Hemoglobin and hematocrit, blood     Status: Abnormal   Collection Time: 12/18/16 11:12 PM  Result Value Ref Range   Hemoglobin 8.0 (L) 13.0 - 17.0 g/dL   HCT 23.0 (L) 39.0 - 11.9 %  Basic metabolic panel     Status: Abnormal   Collection Time: 12/19/16  5:18 AM  Result Value Ref Range   Sodium 139 135 - 145 mmol/L   Potassium 4.0 3.5 - 5.1 mmol/L    Comment: DELTA CHECK NOTED   Chloride 109 101 - 111 mmol/L   CO2 24 22 - 32 mmol/L   Glucose, Bld 92 65 - 99 mg/dL   BUN 56 (H) 6 - 20 mg/dL   Creatinine, Ser 1.22 0.61 - 1.24 mg/dL   Calcium 8.1 (L) 8.9 - 10.3 mg/dL   GFR calc non Af Amer 57 (L) >60 mL/min   GFR calc Af Amer >60 >60 mL/min    Comment: (NOTE) The eGFR has been calculated using the CKD EPI equation. This calculation has not been validated in all clinical situations. eGFR's persistently <60 mL/min signify possible Chronic Kidney Disease.    Anion gap 6 5 - 15  Protime-INR     Status: None   Collection Time: 12/19/16  5:18 AM  Result Value Ref Range   Prothrombin Time 14.0 11.4 - 15.2 seconds   INR 1.08   Hemoglobin and hematocrit, blood     Status: Abnormal   Collection Time: 12/19/16  5:18 AM  Result Value Ref Range   Hemoglobin 7.9 (L) 13.0 - 17.0 g/dL   HCT 22.6 (L) 39.0 - 52.0 %    ABGS No results for input(s): PHART,  PO2ART, TCO2, HCO3 in the last 72 hours.  Invalid input(s): PCO2 CULTURES Recent Results (from the past 240 hour(s))  Culture, blood (Routine X 2) w Reflex to ID Panel     Status: None   Collection Time: 12/13/16  7:52 PM  Result Value Ref Range Status   Specimen Description BLOOD  Final   Special Requests BOTTLES DRAWN AEROBIC ONLY 10CC  Final   Culture NO GROWTH 5 DAYS  Final   Report Status 12/18/2016 FINAL  Final  Culture, blood (Routine X 2) w Reflex to ID Panel     Status: None   Collection Time: 12/13/16  8:02 PM  Result  Value Ref Range Status   Specimen Description BLOOD RIGHT HAND  Final   Special Requests BOTTLES DRAWN AEROBIC ONLY 10CC  Final   Culture NO GROWTH 5 DAYS  Final   Report Status 12/18/2016 FINAL  Final  Urine culture     Status: Abnormal   Collection Time: 12/14/16 12:45 PM  Result Value Ref Range Status   Specimen Description URINE, CLEAN CATCH  Final   Special Requests NONE  Final   Culture MULTIPLE SPECIES PRESENT, SUGGEST RECOLLECTION (A)  Final   Report Status 12/16/2016 FINAL  Final   Studies/Results: Ct Head Wo Contrast  Result Date: 12/18/2016 CLINICAL DATA:  Weakness.  No reported injury. EXAM: CT HEAD WITHOUT CONTRAST TECHNIQUE: Contiguous axial images were obtained from the base of the skull through the vertex without intravenous contrast. COMPARISON:  None. FINDINGS: Brain: No evidence of parenchymal hemorrhage or extra-axial fluid collection. No mass lesion, mass effect, or midline shift. No CT evidence of acute infarction. Nonspecific mild subcortical and periventricular white matter hypodensity, most in keeping with chronic small vessel ischemic change. Cerebral volume is age appropriate. No ventriculomegaly. Vascular: Intracranial atherosclerosis.  No acute abnormality Skull: No evidence of calvarial fracture. Sinuses/Orbits: The visualized paranasal sinuses are essentially clear. Other:  The mastoid air cells are unopacified. IMPRESSION: 1.  No  evidence of acute intracranial abnormality. 2. Mild chronic small vessel ischemia. Electronically Signed   By: Ilona Sorrel M.D.   On: 12/18/2016 18:31   Dg Chest Port 1 View  Result Date: 12/18/2016 CLINICAL DATA:  Shortness of breath and cough EXAM: PORTABLE CHEST 1 VIEW COMPARISON:  12/13/2016 FINDINGS: No acute infiltrate or effusion. Stable cardiomediastinal silhouette with atherosclerosis. No pneumothorax. IMPRESSION: No active disease. Electronically Signed   By: Donavan Foil M.D.   On: 12/18/2016 19:06    Medications:  Prior to Admission:  Prescriptions Prior to Admission  Medication Sig Dispense Refill Last Dose  . ASPIRIN LOW DOSE 81 MG EC tablet TAKE ONE TABLET BY MOUTH ONCE DAILY. 30 tablet 6 12/18/2016 at Unknown time  . carvedilol (COREG) 12.5 MG tablet TAKE 1 TABLET BY MOUTH TWICE DAILY. 60 tablet 0 12/18/2016 at 900a  . docusate sodium (COLACE) 100 MG capsule Take 1 capsule (100 mg total) by mouth 2 (two) times daily. (Patient taking differently: Take 100 mg by mouth at bedtime. ) 10 capsule 0 12/17/2016 at Unknown time  . feeding supplement, ENSURE ENLIVE, (ENSURE ENLIVE) LIQD Take 237 mLs by mouth 2 (two) times daily between meals. 237 mL 12 12/18/2016 at Unknown time  . folic acid (FOLVITE) 1 MG tablet Take 1 tablet (1 mg total) by mouth daily. 30 tablet 3 12/18/2016 at Unknown time  . Multiple Vitamin (DAILY VITE) TABS TAKE ONE TABLET BY MOUTH ONCE DAILY. 90 tablet 1 12/18/2016 at Unknown time  . nitroGLYCERIN (NITROSTAT) 0.4 MG SL tablet Place 0.4 mg under the tongue as needed.     unknown  . pantoprazole (PROTONIX) 40 MG tablet TAKE ONE TABLET BY MOUTH ONCE DAILY. 30 tablet 6 12/18/2016 at Unknown time  . castor oil liquid Take 10 mLs by mouth daily as needed for moderate constipation.   unknown   Scheduled: . acetaminophen  650 mg Oral Once  . carvedilol  12.5 mg Oral BID WC  . diphenhydrAMINE  25 mg Oral Once  . docusate sodium  100 mg Oral QHS  . feeding supplement  1 Container  Oral TID BM  . folic acid  1 mg Oral Daily  .  multivitamin with minerals  1 tablet Oral Daily  . pantoprazole  40 mg Oral Daily  . sodium chloride  1,000 mL Intravenous Once   Continuous: . sodium chloride 40 mL/hr at 12/18/16 2358   LDJ:[id number] **OR** acetaminophen, bisacodyl, ondansetron **OR** ondansetron (ZOFRAN) IV  Assesment: He has had volume depletion. He has GI bleeding. He has dropped his hemoglobin to 7.9 from over 11. He has ischemic cardiomyopathy so is going to need to have transfusion to keep him around 10. He needs GI consultation to see where the bleeding site is Principal Problem:   Hypotension Active Problems:   Coronary artery disease   Chronic kidney disease   Tobacco abuse   Malnutrition of moderate degree   Volume depletion   Ischemic cardiomyopathy   Anemia   Heme + stool    Plan: As above    LOS: 1 day   Kaily Wragg L 12/19/2016, 8:55 AM

## 2016-12-19 NOTE — Consult Note (Signed)
Reason for Consult: GI bleed Referring Physician:  [name] is an 71 y.o. male.  HPI: Admitted thru the ED yesterday.  He says he is not in any pain. Sister apparently sent him by EMS to the ED. He was weak and had not been eating.  He wa hypothensive. Patient cannot tell me why he is in the hospital. In the ED, noted to have a hemoglobin of 8.1. On 12/14/2016 hemoglobin was 12.0. This am his hemoglobin is 7.9.  He is going to receive two units of blood.  He was guaiac positive in the ED.  Lives with sister and daughter.  Discharged from [facility] 12/18/2015 with dx  acute renal failure,  Hx of heart disease, CKD, cirrhosis.  States he has never undergone a colonoscopy.  Past Medical History:  Diagnosis Date  . Arteriosclerotic cardiovascular disease (ASCVD)    IMI in 5/07 with CTO mid cx, 60% LAD, 99% RCA-> BMS; mod. impaired LV function; EF:35-40% 2/10  . Chronic kidney disease    STAGE 3-4-creatinine 2.5 in 2009; 1.7 in 10/2008  . Cirrhosis ([place])    History of excessive alcohol use; continuing social use  . Gastroesophageal reflux disease   . Hyperlipidemia   . Tobacco abuse    60 pack years; one pack per day    History reviewed. No pertinent surgical history.  History reviewed. No pertinent family history.  Social History:  reports that he has been smoking.  He has a 60.00 pack-year smoking history. He has never used smokeless tobacco. He reports that he drinks alcohol. He reports that he does not use drugs.  Allergies: No Known Allergies  Medications: I have reviewed the patient's current medications.  Results for orders placed or performed during the hospital encounter of 12/18/16 (from the past 48 hour(s))  CBC with Differential     Status: Abnormal   Collection Time: 12/18/16  5:16 PM  Result Value Ref Range   WBC 5.3 4.0 - 10.5 K/uL   RBC 2.63 (L) 4.22 - 5.81 MIL/uL   Hemoglobin 8.1 (L) 13.0 - 17.0 g/dL   HCT 23.2 (L) 39.0 - 52.0 %   MCV 88.2 78.0 - 100.0 fL   MCH  30.8 26.0 - 34.0 pg   MCHC 34.9 30.0 - 36.0 g/dL   RDW 13.2 11.5 - 15.5 %   Platelets 104 (L) 150 - 400 K/uL    Comment: SPECIMEN CHECKED FOR CLOTS PLATELET COUNT CONFIRMED BY SMEAR    Neutrophils Relative % 57 %   Neutro Abs 3.0 1.7 - 7.7 K/uL   Lymphocytes Relative 33 %   Lymphs Abs 1.8 0.7 - 4.0 K/uL   Monocytes Relative 8 %   Monocytes Absolute 0.4 0.1 - 1.0 K/uL   Eosinophils Relative 2 %   Eosinophils Absolute 0.1 0.0 - 0.7 K/uL   Basophils Relative 0 %   Basophils Absolute 0.0 0.0 - 0.1 K/uL  Ethanol     Status: None   Collection Time: 12/18/16  5:20 PM  Result Value Ref Range   Alcohol, Ethyl (B) <5 <5 mg/dL    Comment:        LOWEST DETECTABLE LIMIT FOR SERUM ALCOHOL IS 5 mg/dL FOR MEDICAL PURPOSES ONLY   Lactic acid, plasma     Status: None   Collection Time: 12/18/16  5:21 PM  Result Value Ref Range   Lactic Acid, Venous 1.5 0.5 - 1.9 mmol/L  Troponin I     Status: Abnormal   Collection Time: 12/18/16  5:21 PM  Result Value Ref Range   Troponin I 0.05 (HH) <0.03 ng/mL    Comment: CRITICAL RESULT CALLED TO, READ BACK BY AND VERIFIED WITH: CRUISE,M ON 12/18/16 AT 1855 BY LOY,C   Lipase, blood     Status: None   Collection Time: 12/18/16  5:21 PM  Result Value Ref Range   Lipase 50 11 - 51 U/L  Comprehensive metabolic panel     Status: Abnormal   Collection Time: 12/18/16  5:21 PM  Result Value Ref Range   Sodium 137 135 - 145 mmol/L   Potassium 5.2 (H) 3.5 - 5.1 mmol/L    Comment: DELTA CHECK NOTED   Chloride 108 101 - 111 mmol/L   CO2 25 22 - 32 mmol/L   Glucose, Bld 98 65 - 99 mg/dL   BUN 58 (H) 6 - 20 mg/dL   Creatinine, Ser 1.42 (H) 0.61 - 1.24 mg/dL   Calcium 8.0 (L) 8.9 - 10.3 mg/dL   Total Protein 5.3 (L) 6.5 - 8.1 g/dL   Albumin 2.2 (L) 3.5 - 5.0 g/dL   AST 21 15 - 41 U/L   ALT 19 17 - 63 U/L   Alkaline Phosphatase 31 (L) 38 - 126 U/L   Total Bilirubin 0.3 0.3 - 1.2 mg/dL   GFR calc non Af Amer 48 (L) >60 mL/min   GFR calc Af Amer 55 (L) >60  mL/min    Comment: (NOTE) The eGFR has been calculated using the CKD EPI equation. This calculation has not been validated in all clinical situations. eGFR's persistently <60 mL/min signify possible Chronic Kidney Disease.    Anion gap 4 (L) 5 - 15  Urine rapid drug screen (hosp performed)     Status: None   Collection Time: 12/18/16  6:27 PM  Result Value Ref Range   Opiates NONE DETECTED NONE DETECTED   Cocaine NONE DETECTED NONE DETECTED   Benzodiazepines NONE DETECTED NONE DETECTED   Amphetamines NONE DETECTED NONE DETECTED   Tetrahydrocannabinol NONE DETECTED NONE DETECTED   Barbiturates NONE DETECTED NONE DETECTED    Comment:        DRUG SCREEN FOR MEDICAL PURPOSES ONLY.  IF CONFIRMATION IS NEEDED FOR ANY PURPOSE, NOTIFY LAB WITHIN 5 DAYS.        LOWEST DETECTABLE LIMITS FOR URINE DRUG SCREEN Drug Class       Cutoff (ng/mL) Amphetamine      1000 Barbiturate      200 Benzodiazepine   034 Tricyclics       742 Opiates          300 Cocaine          300 THC              50   Urinalysis, Routine w reflex microscopic     Status: Abnormal   Collection Time: 12/18/16  6:27 PM  Result Value Ref Range   Color, Urine STRAW (A) YELLOW   APPearance CLEAR CLEAR   Specific Gravity, Urine 1.010 1.005 - 1.030   pH 5.0 5.0 - 8.0   Glucose, UA NEGATIVE NEGATIVE mg/dL   Hgb urine dipstick NEGATIVE NEGATIVE   Bilirubin Urine NEGATIVE NEGATIVE   Ketones, ur NEGATIVE NEGATIVE mg/dL   Protein, ur NEGATIVE NEGATIVE mg/dL   Nitrite NEGATIVE NEGATIVE   Leukocytes, UA TRACE (A) NEGATIVE   RBC / HPF 0-5 0 - 5 RBC/hpf   WBC, UA 6-30 0 - 5 WBC/hpf   Bacteria, UA RARE (A) NONE SEEN  Squamous Epithelial / LPF 0-5 (A) NONE SEEN  Type and screen [facility]     Status: None (Preliminary result)   Collection Time: 12/18/16  6:29 PM  Result Value Ref Range   ABO/RH(D) B POS    Antibody Screen NEG    Sample Expiration 12/21/2016    Unit Number [id number]    Blood Component Type  RED CELLS,LR    Unit division 00    Status of Unit ALLOCATED    Transfusion Status OK TO TRANSFUSE    Crossmatch Result Compatible   Lactic acid, plasma     Status: Abnormal   Collection Time: 12/18/16  6:30 PM  Result Value Ref Range   Lactic Acid, Venous 2.4 (HH) 0.5 - 1.9 mmol/L    Comment: CRITICAL RESULT CALLED TO, READ BACK BY AND VERIFIED WITH: TALBET,T AT 1924 ON 4.3.2018 BY ISLEY,B   POC occult blood, ED     Status: Abnormal   Collection Time: 12/18/16  7:59 PM  Result Value Ref Range   Fecal Occult Bld POSITIVE (A) NEGATIVE  ABO/Rh     Status: None   Collection Time: 12/18/16 10:48 PM  Result Value Ref Range   ABO/RH(D) B POS   Prepare RBC     Status: None   Collection Time: 12/18/16 10:48 PM  Result Value Ref Range   Order Confirmation ORDER PROCESSED BY BLOOD BANK   Hemoglobin and hematocrit, blood     Status: Abnormal   Collection Time: 12/18/16 11:12 PM  Result Value Ref Range   Hemoglobin 8.0 (L) 13.0 - 17.0 g/dL   HCT 23.0 (L) 39.0 - 30.1 %  Basic metabolic panel     Status: Abnormal   Collection Time: 12/19/16  5:18 AM  Result Value Ref Range   Sodium 139 135 - 145 mmol/L   Potassium 4.0 3.5 - 5.1 mmol/L    Comment: DELTA CHECK NOTED   Chloride 109 101 - 111 mmol/L   CO2 24 22 - 32 mmol/L   Glucose, Bld 92 65 - 99 mg/dL   BUN 56 (H) 6 - 20 mg/dL   Creatinine, Ser 1.22 0.61 - 1.24 mg/dL   Calcium 8.1 (L) 8.9 - 10.3 mg/dL   GFR calc non Af Amer 57 (L) >60 mL/min   GFR calc Af Amer >60 >60 mL/min    Comment: (NOTE) The eGFR has been calculated using the CKD EPI equation. This calculation has not been validated in all clinical situations. eGFR's persistently <60 mL/min signify possible Chronic Kidney Disease.    Anion gap 6 5 - 15  Protime-INR     Status: None   Collection Time: 12/19/16  5:18 AM  Result Value Ref Range   Prothrombin Time 14.0 11.4 - 15.2 seconds   INR 1.08   Hemoglobin and hematocrit, blood     Status: Abnormal   Collection Time:  12/19/16  5:18 AM  Result Value Ref Range   Hemoglobin 7.9 (L) 13.0 - 17.0 g/dL   HCT 22.6 (L) 39.0 - 52.0 %    Ct Head Wo Contrast  Result Date: 12/18/2016 CLINICAL DATA:  Weakness.  No reported injury. EXAM: CT HEAD WITHOUT CONTRAST TECHNIQUE: Contiguous axial images were obtained from the base of the skull through the vertex without intravenous contrast. COMPARISON:  None. FINDINGS: Brain: No evidence of parenchymal hemorrhage or extra-axial fluid collection. No mass lesion, mass effect, or midline shift. No CT evidence of acute infarction. Nonspecific mild subcortical and periventricular white matter hypodensity, most  in keeping with chronic small vessel ischemic change. Cerebral volume is age appropriate. No ventriculomegaly. Vascular: Intracranial atherosclerosis.  No acute abnormality Skull: No evidence of calvarial fracture. Sinuses/Orbits: The visualized paranasal sinuses are essentially clear. Other:  The mastoid air cells are unopacified. IMPRESSION: 1.  No evidence of acute intracranial abnormality. 2. Mild chronic small vessel ischemia. Electronically Signed   By: Ilona Sorrel M.D.   On: 12/18/2016 18:31   Dg Chest Port 1 View  Result Date: 12/18/2016 CLINICAL DATA:  Shortness of breath and cough EXAM: PORTABLE CHEST 1 VIEW COMPARISON:  12/13/2016 FINDINGS: No acute infiltrate or effusion. Stable cardiomediastinal silhouette with atherosclerosis. No pneumothorax. IMPRESSION: No active disease. Electronically Signed   By: Donavan Foil M.D.   On: 12/18/2016 19:06    ROS Blood pressure 110/61, pulse 84, temperature 98.4 F (36.9 C), temperature source Oral, resp. rate 16, height '5\' 2"'  (1.575 m), weight 118 lb 8 oz (53.8 kg), SpO2 100 %. Physical Exam  Assessment/Plan: Guaiac positive stool. Will be transfused with 2 units of PRBCs. Possible colonoscopy. Will discuss with Dr. Rosalin Hawking 12/19/2016, 8:20 AM

## 2016-12-20 ENCOUNTER — Encounter (HOSPITAL_COMMUNITY): Payer: Self-pay | Admitting: Internal Medicine

## 2016-12-20 DIAGNOSIS — K259 Gastric ulcer, unspecified as acute or chronic, without hemorrhage or perforation: Secondary | ICD-10-CM | POA: Diagnosis present

## 2016-12-20 DIAGNOSIS — D62 Acute posthemorrhagic anemia: Secondary | ICD-10-CM | POA: Diagnosis present

## 2016-12-20 LAB — URINE CULTURE: CULTURE: NO GROWTH

## 2016-12-20 LAB — BASIC METABOLIC PANEL
Anion gap: 4 — ABNORMAL LOW (ref 5–15)
BUN: 29 mg/dL — ABNORMAL HIGH (ref 6–20)
CHLORIDE: 107 mmol/L (ref 101–111)
CO2: 25 mmol/L (ref 22–32)
CREATININE: 1.17 mg/dL (ref 0.61–1.24)
Calcium: 8.1 mg/dL — ABNORMAL LOW (ref 8.9–10.3)
GFR calc non Af Amer: >60 mL/min (ref >60)
Glucose, Bld: 105 mg/dL — ABNORMAL HIGH (ref 65–99)
POTASSIUM: 4.1 mmol/L (ref 3.5–5.1)
SODIUM: 136 mmol/L (ref 135–145)

## 2016-12-20 LAB — CBC WITH DIFFERENTIAL/PLATELET
BASOS PCT: 0 %
Basophils Absolute: 0 10*3/uL (ref 0.0–0.1)
Eosinophils Absolute: 0.4 10*3/uL (ref 0.0–0.7)
Eosinophils Relative: 7 %
HEMATOCRIT: 29.2 % — AB (ref 39.0–52.0)
HEMOGLOBIN: 10.1 g/dL — AB (ref 13.0–17.0)
LYMPHS ABS: 2.3 10*3/uL (ref 0.7–4.0)
Lymphocytes Relative: 42 %
MCH: 30 pg (ref 26.0–34.0)
MCHC: 34.6 g/dL (ref 30.0–36.0)
MCV: 86.6 fL (ref 78.0–100.0)
MONOS PCT: 5 %
Monocytes Absolute: 0.2 10*3/uL (ref 0.1–1.0)
NEUTROS ABS: 2.5 10*3/uL (ref 1.7–7.7)
Neutrophils Relative %: 46 %
Platelets: 100 10*3/uL — ABNORMAL LOW (ref 150–400)
RBC: 3.37 MIL/uL — AB (ref 4.22–5.81)
RDW: 14.1 % (ref 11.5–15.5)
WBC: 5.4 10*3/uL (ref 4.0–10.5)

## 2016-12-20 LAB — PREPARE RBC (CROSSMATCH)

## 2016-12-20 MED ORDER — PANTOPRAZOLE SODIUM 40 MG PO TBEC
40.0000 mg | DELAYED_RELEASE_TABLET | Freq: Two times a day (BID) | ORAL | Status: DC
  Administered 2016-12-20: 40 mg via ORAL
  Filled 2016-12-20: qty 1

## 2016-12-20 NOTE — Progress Notes (Signed)
Subjective: He says he feels okay. He is much more alert this morning. Endoscopy results noted and discussed with Dr. Laural Golden. No cough or congestion. No chest pain. He has not noticed any bleeding. No abdominal pain.  Objective: Vital signs in last 24 hours: Temp:  [97.3 F (36.3 C)-98.7 F (37.1 C)] 97.3 F (36.3 C) (04/05 0456) Pulse Rate:  [34-87] 77 (04/05 0456) Resp:  [8-20] 18 (04/05 0456) BP: (81-128)/(48-70) 110/52 (04/05 0456) SpO2:  [95 %-100 %] 100 % (04/05 0456) Weight:  [52.9 kg (116 lb 10 oz)] 52.9 kg (116 lb 10 oz) (04/05 0456) Weight change: -0.851 kg (-1 lb 14 oz) Last BM Date: 12/18/16  Intake/Output from previous day: 04/04 0701 - 04/05 0700 In: 2195 [P.O.:360; I.V.:1250; Blood:585] Out: 1000 [Urine:1000]  PHYSICAL EXAM General appearance: alert, cooperative and no distress Resp: clear to auscultation bilaterally Cardio: regular rate and rhythm, S1, S2 normal, no murmur, click, rub or gallop GI: soft, non-tender; bowel sounds normal; no masses,  no organomegaly Extremities: extremities normal, atraumatic, no cyanosis or edema Skin warm and dry. Mucous membranes moist  Lab Results:  Results for orders placed or performed during the hospital encounter of 12/18/16 (from the past 48 hour(s))  CBC with Differential     Status: Abnormal   Collection Time: 12/18/16  5:16 PM  Result Value Ref Range   WBC 5.3 4.0 - 10.5 K/uL   RBC 2.63 (L) 4.22 - 5.81 MIL/uL   Hemoglobin 8.1 (L) 13.0 - 17.0 g/dL   HCT 23.2 (L) 39.0 - 52.0 %   MCV 88.2 78.0 - 100.0 fL   MCH 30.8 26.0 - 34.0 pg   MCHC 34.9 30.0 - 36.0 g/dL   RDW 13.2 11.5 - 15.5 %   Platelets 104 (L) 150 - 400 K/uL    Comment: SPECIMEN CHECKED FOR CLOTS PLATELET COUNT CONFIRMED BY SMEAR    Neutrophils Relative % 57 %   Neutro Abs 3.0 1.7 - 7.7 K/uL   Lymphocytes Relative 33 %   Lymphs Abs 1.8 0.7 - 4.0 K/uL   Monocytes Relative 8 %   Monocytes Absolute 0.4 0.1 - 1.0 K/uL   Eosinophils Relative 2 %   Eosinophils Absolute 0.1 0.0 - 0.7 K/uL   Basophils Relative 0 %   Basophils Absolute 0.0 0.0 - 0.1 K/uL  Ethanol     Status: None   Collection Time: 12/18/16  5:20 PM  Result Value Ref Range   Alcohol, Ethyl (B) <5 <5 mg/dL    Comment:        LOWEST DETECTABLE LIMIT FOR SERUM ALCOHOL IS 5 mg/dL FOR MEDICAL PURPOSES ONLY   Lactic acid, plasma     Status: None   Collection Time: 12/18/16  5:21 PM  Result Value Ref Range   Lactic Acid, Venous 1.5 0.5 - 1.9 mmol/L  Troponin I     Status: Abnormal   Collection Time: 12/18/16  5:21 PM  Result Value Ref Range   Troponin I 0.05 (HH) <0.03 ng/mL    Comment: CRITICAL RESULT CALLED TO, READ BACK BY AND VERIFIED WITH: CRUISE,M ON 12/18/16 AT 1855 BY LOY,C   Lipase, blood     Status: None   Collection Time: 12/18/16  5:21 PM  Result Value Ref Range   Lipase 50 11 - 51 U/L  Comprehensive metabolic panel     Status: Abnormal   Collection Time: 12/18/16  5:21 PM  Result Value Ref Range   Sodium 137 135 - 145 mmol/L  Potassium 5.2 (H) 3.5 - 5.1 mmol/L    Comment: DELTA CHECK NOTED   Chloride 108 101 - 111 mmol/L   CO2 25 22 - 32 mmol/L   Glucose, Bld 98 65 - 99 mg/dL   BUN 58 (H) 6 - 20 mg/dL   Creatinine, Ser 1.42 (H) 0.61 - 1.24 mg/dL   Calcium 8.0 (L) 8.9 - 10.3 mg/dL   Total Protein 5.3 (L) 6.5 - 8.1 g/dL   Albumin 2.2 (L) 3.5 - 5.0 g/dL   AST 21 15 - 41 U/L   ALT 19 17 - 63 U/L   Alkaline Phosphatase 31 (L) 38 - 126 U/L   Total Bilirubin 0.3 0.3 - 1.2 mg/dL   GFR calc non Af Amer 48 (L) >60 mL/min   GFR calc Af Amer 55 (L) >60 mL/min    Comment: (NOTE) The eGFR has been calculated using the CKD EPI equation. This calculation has not been validated in all clinical situations. eGFR's persistently <60 mL/min signify possible Chronic Kidney Disease.    Anion gap 4 (L) 5 - 15  Urine rapid drug screen (hosp performed)     Status: None   Collection Time: 12/18/16  6:27 PM  Result Value Ref Range   Opiates NONE DETECTED NONE  DETECTED   Cocaine NONE DETECTED NONE DETECTED   Benzodiazepines NONE DETECTED NONE DETECTED   Amphetamines NONE DETECTED NONE DETECTED   Tetrahydrocannabinol NONE DETECTED NONE DETECTED   Barbiturates NONE DETECTED NONE DETECTED    Comment:        DRUG SCREEN FOR MEDICAL PURPOSES ONLY.  IF CONFIRMATION IS NEEDED FOR ANY PURPOSE, NOTIFY LAB WITHIN 5 DAYS.        LOWEST DETECTABLE LIMITS FOR URINE DRUG SCREEN Drug Class       Cutoff (ng/mL) Amphetamine      1000 Barbiturate      200 Benzodiazepine   950 Tricyclics       932 Opiates          300 Cocaine          300 THC              50   Urinalysis, Routine w reflex microscopic     Status: Abnormal   Collection Time: 12/18/16  6:27 PM  Result Value Ref Range   Color, Urine STRAW (A) YELLOW   APPearance CLEAR CLEAR   Specific Gravity, Urine 1.010 1.005 - 1.030   pH 5.0 5.0 - 8.0   Glucose, UA NEGATIVE NEGATIVE mg/dL   Hgb urine dipstick NEGATIVE NEGATIVE   Bilirubin Urine NEGATIVE NEGATIVE   Ketones, ur NEGATIVE NEGATIVE mg/dL   Protein, ur NEGATIVE NEGATIVE mg/dL   Nitrite NEGATIVE NEGATIVE   Leukocytes, UA TRACE (A) NEGATIVE   RBC / HPF 0-5 0 - 5 RBC/hpf   WBC, UA 6-30 0 - 5 WBC/hpf   Bacteria, UA RARE (A) NONE SEEN   Squamous Epithelial / LPF 0-5 (A) NONE SEEN  Urine culture     Status: None   Collection Time: 12/18/16  6:27 PM  Result Value Ref Range   Specimen Description URINE, CLEAN CATCH    Special Requests NONE    Culture      NO GROWTH Performed at [facility], [address] [place], [place] [address]    Report Status 12/20/2016 FINAL   Type and screen [facility]     Status: None (Preliminary result)   Collection Time: 12/18/16  6:29 PM  Result Value Ref Range   ABO/RH(D) B POS    Antibody Screen NEG    Sample Expiration 12/21/2016    Unit Number [id number]    Blood Component Type RED CELLS,LR    Unit division 00    Status of Unit ISSUED    Transfusion Status OK TO TRANSFUSE     Crossmatch Result Compatible    Unit Number [id number]    Blood Component Type RBC LR PHER1    Unit division 00    Status of Unit ISSUED    Transfusion Status OK TO TRANSFUSE    Crossmatch Result Compatible   Lactic acid, plasma     Status: Abnormal   Collection Time: 12/18/16  6:30 PM  Result Value Ref Range   Lactic Acid, Venous 2.4 (HH) 0.5 - 1.9 mmol/L    Comment: CRITICAL RESULT CALLED TO, READ BACK BY AND VERIFIED WITH: TALBET,T AT 1924 ON 4.3.2018 BY ISLEY,B   POC occult blood, ED     Status: Abnormal   Collection Time: 12/18/16  7:59 PM  Result Value Ref Range   Fecal Occult Bld POSITIVE (A) NEGATIVE  ABO/Rh     Status: None   Collection Time: 12/18/16 10:48 PM  Result Value Ref Range   ABO/RH(D) B POS   Prepare RBC     Status: None   Collection Time: 12/18/16 10:48 PM  Result Value Ref Range   Order Confirmation ORDER PROCESSED BY BLOOD BANK   Hemoglobin and hematocrit, blood     Status: Abnormal   Collection Time: 12/18/16 11:12 PM  Result Value Ref Range   Hemoglobin 8.0 (L) 13.0 - 17.0 g/dL   HCT 23.0 (L) 39.0 - 08.6 %  Basic metabolic panel     Status: Abnormal   Collection Time: 12/19/16  5:18 AM  Result Value Ref Range   Sodium 139 135 - 145 mmol/L   Potassium 4.0 3.5 - 5.1 mmol/L    Comment: DELTA CHECK NOTED   Chloride 109 101 - 111 mmol/L   CO2 24 22 - 32 mmol/L   Glucose, Bld 92 65 - 99 mg/dL   BUN 56 (H) 6 - 20 mg/dL   Creatinine, Ser 1.22 0.61 - 1.24 mg/dL   Calcium 8.1 (L) 8.9 - 10.3 mg/dL   GFR calc non Af Amer 57 (L) >60 mL/min   GFR calc Af Amer >60 >60 mL/min    Comment: (NOTE) The eGFR has been calculated using the CKD EPI equation. This calculation has not been validated in all clinical situations. eGFR's persistently <60 mL/min signify possible Chronic Kidney Disease.    Anion gap 6 5 - 15  Protime-INR     Status: None   Collection Time: 12/19/16  5:18 AM  Result Value Ref Range   Prothrombin Time 14.0 11.4 - 15.2 seconds   INR  1.08   Hemoglobin and hematocrit, blood     Status: Abnormal   Collection Time: 12/19/16  5:18 AM  Result Value Ref Range   Hemoglobin 7.9 (L) 13.0 - 17.0 g/dL   HCT 22.6 (L) 39.0 - 52.0 %  CBC with Differential/Platelet     Status: Abnormal (Preliminary result)   Collection Time: 12/20/16  8:30 AM  Result Value Ref Range   WBC 5.4 4.0 - 10.5 K/uL   RBC 3.37 (L) 4.22 - 5.81 MIL/uL   Hemoglobin 10.1 (L) 13.0 - 17.0 g/dL    Comment: DELTA CHECK NOTED POST TRANSFUSION SPECIMEN    HCT 29.2 (L) 39.0 -  52.0 %   MCV 86.6 78.0 - 100.0 fL   MCH 30.0 26.0 - 34.0 pg   MCHC 34.6 30.0 - 36.0 g/dL   RDW 14.1 11.5 - 15.5 %   Platelets PENDING 150 - 400 K/uL   Neutrophils Relative % 46 %   Neutro Abs 2.5 1.7 - 7.7 K/uL   Lymphocytes Relative 42 %   Lymphs Abs 2.3 0.7 - 4.0 K/uL   Monocytes Relative 5 %   Monocytes Absolute 0.2 0.1 - 1.0 K/uL   Eosinophils Relative 7 %   Eosinophils Absolute 0.4 0.0 - 0.7 K/uL   Basophils Relative 0 %   Basophils Absolute 0.0 0.0 - 0.1 K/uL  Basic metabolic panel     Status: Abnormal   Collection Time: 12/20/16  8:30 AM  Result Value Ref Range   Sodium 136 135 - 145 mmol/L   Potassium 4.1 3.5 - 5.1 mmol/L   Chloride 107 101 - 111 mmol/L   CO2 25 22 - 32 mmol/L   Glucose, Bld 105 (H) 65 - 99 mg/dL   BUN 29 (H) 6 - 20 mg/dL   Creatinine, Ser 1.17 0.61 - 1.24 mg/dL   Calcium 8.1 (L) 8.9 - 10.3 mg/dL   GFR calc non Af Amer >60 >60 mL/min   GFR calc Af Amer >60 >60 mL/min    Comment: (NOTE) The eGFR has been calculated using the CKD EPI equation. This calculation has not been validated in all clinical situations. eGFR's persistently <60 mL/min signify possible Chronic Kidney Disease.    Anion gap 4 (L) 5 - 15    ABGS No results for input(s): PHART, PO2ART, TCO2, HCO3 in the last 72 hours.  Invalid input(s): PCO2 CULTURES Recent Results (from the past 240 hour(s))  Culture, blood (Routine X 2) w Reflex to ID Panel     Status: None   Collection  Time: 12/13/16  7:52 PM  Result Value Ref Range Status   Specimen Description BLOOD  Final   Special Requests BOTTLES DRAWN AEROBIC ONLY 10CC  Final   Culture NO GROWTH 5 DAYS  Final   Report Status 12/18/2016 FINAL  Final  Culture, blood (Routine X 2) w Reflex to ID Panel     Status: None   Collection Time: 12/13/16  8:02 PM  Result Value Ref Range Status   Specimen Description BLOOD RIGHT HAND  Final   Special Requests BOTTLES DRAWN AEROBIC ONLY 10CC  Final   Culture NO GROWTH 5 DAYS  Final   Report Status 12/18/2016 FINAL  Final  Urine culture     Status: Abnormal   Collection Time: 12/14/16 12:45 PM  Result Value Ref Range Status   Specimen Description URINE, CLEAN CATCH  Final   Special Requests NONE  Final   Culture MULTIPLE SPECIES PRESENT, SUGGEST RECOLLECTION (A)  Final   Report Status 12/16/2016 FINAL  Final  Urine culture     Status: None   Collection Time: 12/18/16  6:27 PM  Result Value Ref Range Status   Specimen Description URINE, CLEAN CATCH  Final   Special Requests NONE  Final   Culture   Final    NO GROWTH Performed at [facility], [place] [address], [place], [place] [address]    Report Status 12/20/2016 FINAL  Final   Studies/Results: Ct Head Wo Contrast  Result Date: 12/18/2016 CLINICAL DATA:  Weakness.  No reported injury. EXAM: CT HEAD WITHOUT CONTRAST TECHNIQUE: Contiguous axial images were obtained from the base of  the skull through the vertex without intravenous contrast. COMPARISON:  None. FINDINGS: Brain: No evidence of parenchymal hemorrhage or extra-axial fluid collection. No mass lesion, mass effect, or midline shift. No CT evidence of acute infarction. Nonspecific mild subcortical and periventricular white matter hypodensity, most in keeping with chronic small vessel ischemic change. Cerebral volume is age appropriate. No ventriculomegaly. Vascular: Intracranial atherosclerosis.  No acute abnormality Skull: No evidence of calvarial fracture.  Sinuses/Orbits: The visualized paranasal sinuses are essentially clear. Other:  The mastoid air cells are unopacified. IMPRESSION: 1.  No evidence of acute intracranial abnormality. 2. Mild chronic small vessel ischemia. Electronically Signed   By: Ilona Sorrel M.D.   On: 12/18/2016 18:31   Dg Chest Port 1 View  Result Date: 12/18/2016 CLINICAL DATA:  Shortness of breath and cough EXAM: PORTABLE CHEST 1 VIEW COMPARISON:  12/13/2016 FINDINGS: No acute infiltrate or effusion. Stable cardiomediastinal silhouette with atherosclerosis. No pneumothorax. IMPRESSION: No active disease. Electronically Signed   By: Donavan Foil M.D.   On: 12/18/2016 19:06    Medications:  Prior to Admission:  Prescriptions Prior to Admission  Medication Sig Dispense Refill Last Dose  . ASPIRIN LOW DOSE 81 MG EC tablet TAKE ONE TABLET BY MOUTH ONCE DAILY. 30 tablet 6 12/18/2016 at Unknown time  . carvedilol (COREG) 12.5 MG tablet TAKE 1 TABLET BY MOUTH TWICE DAILY. 60 tablet 0 12/18/2016 at 900a  . docusate sodium (COLACE) 100 MG capsule Take 1 capsule (100 mg total) by mouth 2 (two) times daily. (Patient taking differently: Take 100 mg by mouth at bedtime. ) 10 capsule 0 12/17/2016 at Unknown time  . feeding supplement, ENSURE ENLIVE, (ENSURE ENLIVE) LIQD Take 237 mLs by mouth 2 (two) times daily between meals. 237 mL 12 12/18/2016 at Unknown time  . folic acid (FOLVITE) 1 MG tablet Take 1 tablet (1 mg total) by mouth daily. 30 tablet 3 12/18/2016 at Unknown time  . Multiple Vitamin (DAILY VITE) TABS TAKE ONE TABLET BY MOUTH ONCE DAILY. 90 tablet 1 12/18/2016 at Unknown time  . nitroGLYCERIN (NITROSTAT) 0.4 MG SL tablet Place 0.4 mg under the tongue as needed.     unknown  . pantoprazole (PROTONIX) 40 MG tablet TAKE ONE TABLET BY MOUTH ONCE DAILY. 30 tablet 6 12/18/2016 at Unknown time  . castor oil liquid Take 10 mLs by mouth daily as needed for moderate constipation.   unknown   Scheduled: . carvedilol  12.5 mg Oral BID WC  .  docusate sodium  100 mg Oral QHS  . feeding supplement  1 Container Oral TID BM  . folic acid  1 mg Oral Daily  . multivitamin with minerals  1 tablet Oral Daily  . pantoprazole (PROTONIX) IV  40 mg Intravenous Q12H  . sodium chloride  1,000 mL Intravenous Once  . sucralfate  1 g Oral TID WC & HS   Continuous: . sodium chloride 40 mL/hr at 12/20/16 0556   MGQ:[id number] **OR** acetaminophen, bisacodyl, ondansetron **OR** ondansetron (ZOFRAN) IV  Assesment: He was admitted because of hypotension. He was found to be anemic and this is acute blood loss anemia. He has a gastric ulcer. Biopsy is pending. Blood testing from this morning is not back yet. He has coronary disease so we need to keep his hemoglobin around 10. He has moderate malnutrition as well. He is receiving feeding supplements. He's not having any chest pain. Principal Problem:   Hypotension Active Problems:   Coronary artery disease   Chronic kidney disease  Tobacco abuse   Malnutrition of moderate degree   Volume depletion   Ischemic cardiomyopathy   Anemia   Heme + stool    Plan: Check labs. Continue treatments. Await pathology    LOS: 2 days   Zaliah Wissner L 12/20/2016, 8:59 AM

## 2016-12-20 NOTE — Progress Notes (Addendum)
Patient ID: [name], male   DOB: 02-12-1946, 71 y.o.   MRN: [id number] States he feels pretty.  He is hungry. Underwent and EGD yesterday which reveale     The duodenal bulb and second portion of the duodenum were normal. Impression:               - Normal esophagus.                           - Z-line irregular, 38 cm from the incisors.                           - 2 cm hiatal hernia.                           - Granular and nodular mucosa in the gastric fundus                            and gastric body. Biopsied.                           - Non-bleeding gastric ulcer with pigmented                            material. Biopsied.                           - Normal duodenal bulb and second portion of the                            duodenum.  CBC    Component Value Date/Time   WBC 5.3 12/18/2016 1716   RBC 2.63 (L) 12/18/2016 1716   HGB 7.9 (L) 12/19/2016 0518   HCT 22.6 (L) 12/19/2016 0518   PLT 104 (L) 12/18/2016 1716   MCV 88.2 12/18/2016 1716   MCH 30.8 12/18/2016 1716   MCHC 34.9 12/18/2016 1716   RDW 13.2 12/18/2016 1716   LYMPHSABS 1.8 12/18/2016 1716   MONOABS 0.4 12/18/2016 1716   EOSABS 0.1 12/18/2016 1716   BASOSABS 0.0 12/18/2016 1716     Blood pressure (!) 110/52, pulse 77, temperature 97.3 F (36.3 C), temperature source Oral, resp. rate 18, height '5\' 2"'$  (1.575 m), weight 116 lb 10 oz (52.9 kg), SpO2 100 %. GI bleed. Repeat EGD in 12 weeks. Will need a colonoscopy in the near future.  GI attending note: Patient has no complaints. He is not hungry. He states he ate all of his breakfast. He is finishing up his lunch. Abdomen is soft and nontender. Hgb. after 2 units of PRBCs. Platelet count 100 K. BUN down to 29. Gastric biopsy pending.  PI changed to oral route.

## 2016-12-21 ENCOUNTER — Encounter (HOSPITAL_COMMUNITY): Payer: Self-pay | Admitting: Gastroenterology

## 2016-12-21 ENCOUNTER — Encounter (HOSPITAL_COMMUNITY)
Admission: EM | Disposition: A | Discharge status: Home Health | Payer: Self-pay | Source: Home / Self Care | Attending: Pulmonary Disease

## 2016-12-21 DIAGNOSIS — G9340 Encephalopathy, unspecified: Secondary | ICD-10-CM

## 2016-12-21 DIAGNOSIS — R578 Other shock: Secondary | ICD-10-CM

## 2016-12-21 DIAGNOSIS — K254 Chronic or unspecified gastric ulcer with hemorrhage: Principal | ICD-10-CM

## 2016-12-21 DIAGNOSIS — K250 Acute gastric ulcer with hemorrhage: Secondary | ICD-10-CM

## 2016-12-21 DIAGNOSIS — D62 Acute posthemorrhagic anemia: Secondary | ICD-10-CM

## 2016-12-21 DIAGNOSIS — K922 Gastrointestinal hemorrhage, unspecified: Secondary | ICD-10-CM | POA: Diagnosis not present

## 2016-12-21 DIAGNOSIS — Q8789 Other specified congenital malformation syndromes, not elsewhere classified: Secondary | ICD-10-CM

## 2016-12-21 HISTORY — PX: ESOPHAGOGASTRODUODENOSCOPY: SHX5428

## 2016-12-21 LAB — HEMOGLOBIN AND HEMATOCRIT, BLOOD
HCT: 21.2 % — ABNORMAL LOW (ref 39.0–52.0)
HEMATOCRIT: 25.3 % — AB (ref 39.0–52.0)
HEMATOCRIT: 24.8 % — AB (ref 39.0–52.0)
HEMOGLOBIN: 8.6 g/dL — AB (ref 13.0–17.0)
HEMOGLOBIN: 8.3 g/dL — AB (ref 13.0–17.0)
Hemoglobin: 7.4 g/dL — ABNORMAL LOW (ref 13.0–17.0)

## 2016-12-21 LAB — MAGNESIUM
MAGNESIUM: 2.1 mg/dL (ref 1.7–2.4)
Magnesium: 1.6 mg/dL — ABNORMAL LOW (ref 1.7–2.4)

## 2016-12-21 LAB — GLUCOSE, CAPILLARY
GLUCOSE-CAPILLARY: 144 mg/dL — AB (ref 65–99)
GLUCOSE-CAPILLARY: 75 mg/dL (ref 65–99)
Glucose-Capillary: 123 mg/dL — ABNORMAL HIGH (ref 65–99)
Glucose-Capillary: 74 mg/dL (ref 65–99)
Glucose-Capillary: 86 mg/dL (ref 65–99)
Glucose-Capillary: 91 mg/dL (ref 65–99)

## 2016-12-21 LAB — CBC
HEMATOCRIT: 21.9 % — AB (ref 39.0–52.0)
HEMATOCRIT: 25.6 % — AB (ref 39.0–52.0)
Hemoglobin: 7.2 g/dL — ABNORMAL LOW (ref 13.0–17.0)
Hemoglobin: 8.6 g/dL — ABNORMAL LOW (ref 13.0–17.0)
MCH: 28.4 pg (ref 26.0–34.0)
MCH: 29.1 pg (ref 26.0–34.0)
MCHC: 32.9 g/dL (ref 30.0–36.0)
MCHC: 33.6 g/dL (ref 30.0–36.0)
MCV: 84.5 fL (ref 78.0–100.0)
MCV: 88.7 fL (ref 78.0–100.0)
PLATELETS: 62 10*3/uL — AB (ref 150–400)
Platelets: 97 10*3/uL — ABNORMAL LOW (ref 150–400)
RBC: 2.47 MIL/uL — ABNORMAL LOW (ref 4.22–5.81)
RBC: 3.03 MIL/uL — ABNORMAL LOW (ref 4.22–5.81)
RDW: 14 % (ref 11.5–15.5)
RDW: 15.5 % (ref 11.5–15.5)
WBC: 7.3 10*3/uL (ref 4.0–10.5)
WBC: 9.4 10*3/uL (ref 4.0–10.5)

## 2016-12-21 LAB — COMPREHENSIVE METABOLIC PANEL
ALBUMIN: 2 g/dL — AB (ref 3.5–5.0)
ALK PHOS: 25 U/L — AB (ref 38–126)
ALT: 19 U/L (ref 17–63)
ANION GAP: 7 (ref 5–15)
AST: 25 U/L (ref 15–41)
BILIRUBIN TOTAL: 0.4 mg/dL (ref 0.3–1.2)
BUN: 34 mg/dL — AB (ref 6–20)
CALCIUM: 7.6 mg/dL — AB (ref 8.9–10.3)
CO2: 22 mmol/L (ref 22–32)
Chloride: 106 mmol/L (ref 101–111)
Creatinine, Ser: 1.34 mg/dL — ABNORMAL HIGH (ref 0.61–1.24)
GFR calc Af Amer: 59 mL/min — ABNORMAL LOW (ref >60)
GFR, EST NON AFRICAN AMERICAN: 51 mL/min — AB (ref >60)
Glucose, Bld: 160 mg/dL — ABNORMAL HIGH (ref 65–99)
Potassium: 3.9 mmol/L (ref 3.5–5.1)
Sodium: 135 mmol/L (ref 135–145)
Total Protein: 4.7 g/dL — ABNORMAL LOW (ref 6.5–8.1)

## 2016-12-21 LAB — PROTIME-INR
INR: 1.14
Prothrombin Time: 14.7 seconds (ref 11.4–15.2)

## 2016-12-21 LAB — BASIC METABOLIC PANEL
Anion gap: 6 (ref 5–15)
BUN: 34 mg/dL — AB (ref 6–20)
CALCIUM: 7.1 mg/dL — AB (ref 8.9–10.3)
CO2: 20 mmol/L — ABNORMAL LOW (ref 22–32)
Chloride: 112 mmol/L — ABNORMAL HIGH (ref 101–111)
Creatinine, Ser: 1.17 mg/dL (ref 0.61–1.24)
GFR calc Af Amer: >60 mL/min (ref >60)
GLUCOSE: 100 mg/dL — AB (ref 65–99)
POTASSIUM: 4.9 mmol/L (ref 3.5–5.1)
Sodium: 138 mmol/L (ref 135–145)

## 2016-12-21 LAB — ABO/RH: ABO/RH(D): B POS

## 2016-12-21 LAB — PREPARE RBC (CROSSMATCH)

## 2016-12-21 LAB — APTT: APTT: 28 s (ref 24–36)

## 2016-12-21 LAB — MRSA PCR SCREENING: MRSA BY PCR: NEGATIVE

## 2016-12-21 LAB — TROPONIN I: Troponin I: 0.04 ng/mL (ref <0.03)

## 2016-12-21 SURGERY — EGD (ESOPHAGOGASTRODUODENOSCOPY)
Anesthesia: Moderate Sedation

## 2016-12-21 MED ORDER — PANTOPRAZOLE SODIUM 40 MG IV SOLR
INTRAVENOUS | Status: AC
  Filled 2016-12-21: qty 80

## 2016-12-21 MED ORDER — NOREPINEPHRINE 4 MG/250ML-% IV SOLN
INTRAVENOUS | Status: AC
  Filled 2016-12-21: qty 250

## 2016-12-21 MED ORDER — EPINEPHRINE PF 1 MG/10ML IJ SOSY
PREFILLED_SYRINGE | INTRAMUSCULAR | Status: AC
  Filled 2016-12-21: qty 10

## 2016-12-21 MED ORDER — SODIUM CHLORIDE 0.9 % IV SOLN
Freq: Once | INTRAVENOUS | Status: AC
  Administered 2016-12-21: 03:00:00 via INTRAVENOUS

## 2016-12-21 MED ORDER — DEXTROSE 5 % IV SOLN
1.0000 g | INTRAVENOUS | Status: DC
  Administered 2016-12-21 – 2016-12-26 (×6): 1 g via INTRAVENOUS
  Filled 2016-12-21 (×7): qty 10

## 2016-12-21 MED ORDER — MIDAZOLAM HCL 5 MG/5ML IJ SOLN
INTRAMUSCULAR | Status: DC | PRN
  Administered 2016-12-21: 2 mg via INTRAVENOUS
  Administered 2016-12-21: 1 mg via INTRAVENOUS

## 2016-12-21 MED ORDER — ORAL CARE MOUTH RINSE
15.0000 mL | Freq: Two times a day (BID) | OROMUCOSAL | Status: DC
  Administered 2016-12-21 – 2016-12-26 (×11): 15 mL via OROMUCOSAL

## 2016-12-21 MED ORDER — SODIUM CHLORIDE 0.9 % IV SOLN
8.0000 mg/h | INTRAVENOUS | Status: AC
  Administered 2016-12-21 – 2016-12-23 (×7): 8 mg/h via INTRAVENOUS
  Filled 2016-12-21 (×14): qty 80

## 2016-12-21 MED ORDER — SODIUM CHLORIDE 0.9 % IJ SOLN
PREFILLED_SYRINGE | INTRAMUSCULAR | Status: DC | PRN
  Administered 2016-12-21: 10 mL

## 2016-12-21 MED ORDER — STERILE WATER FOR IRRIGATION IR SOLN
Status: DC | PRN
  Administered 2016-12-21: 50 mL

## 2016-12-21 MED ORDER — METOCLOPRAMIDE HCL 5 MG/ML IJ SOLN
10.0000 mg | Freq: Once | INTRAMUSCULAR | Status: AC
  Administered 2016-12-21: 10 mg via INTRAVENOUS
  Filled 2016-12-21: qty 2

## 2016-12-21 MED ORDER — LIDOCAINE VISCOUS 2 % MT SOLN
OROMUCOSAL | Status: AC
  Filled 2016-12-21: qty 15

## 2016-12-21 MED ORDER — NOREPINEPHRINE BITARTRATE 1 MG/ML IV SOLN
0.0000 ug/min | INTRAVENOUS | Status: DC
  Administered 2016-12-21: 2 ug/min via INTRAVENOUS
  Filled 2016-12-21: qty 4

## 2016-12-21 MED ORDER — SIMETHICONE 40 MG/0.6ML PO SUSP
ORAL | Status: AC
Start: 2016-12-21 — End: 2016-12-21
  Filled 2016-12-21: qty 0.6

## 2016-12-21 MED ORDER — LIDOCAINE VISCOUS 2 % MT SOLN
OROMUCOSAL | Status: DC | PRN
  Administered 2016-12-21: 4 mL via OROMUCOSAL

## 2016-12-21 MED ORDER — PANTOPRAZOLE SODIUM 40 MG IV SOLR
40.0000 mg | Freq: Once | INTRAVENOUS | Status: AC
  Administered 2016-12-21: 40 mg via INTRAVENOUS
  Filled 2016-12-21: qty 40

## 2016-12-21 MED ORDER — MEPERIDINE HCL 100 MG/ML IJ SOLN
INTRAMUSCULAR | Status: AC
  Filled 2016-12-21: qty 2

## 2016-12-21 MED ORDER — ETHANOLAMINE OLEATE 5 % IV SOLN
INTRAVENOUS | Status: DC
Start: 2016-12-21 — End: 2016-12-21
  Filled 2016-12-21: qty 2

## 2016-12-21 MED ORDER — MEPERIDINE HCL 100 MG/ML IJ SOLN
INTRAMUSCULAR | Status: DC | PRN
  Administered 2016-12-21: 25 mg via INTRAVENOUS

## 2016-12-21 MED ORDER — MIDAZOLAM HCL 5 MG/5ML IJ SOLN
INTRAMUSCULAR | Status: AC
  Filled 2016-12-21: qty 10

## 2016-12-21 MED ORDER — DEXTROSE 5 % IV SOLN
INTRAVENOUS | Status: AC
  Filled 2016-12-21: qty 10

## 2016-12-21 MED ORDER — MAGNESIUM SULFATE 2 GM/50ML IV SOLN
2.0000 g | Freq: Once | INTRAVENOUS | Status: AC
  Administered 2016-12-21: 2 g via INTRAVENOUS
  Filled 2016-12-21: qty 50

## 2016-12-21 MED ORDER — FOLIC ACID 5 MG/ML IJ SOLN
1.0000 mg | Freq: Every day | INTRAMUSCULAR | Status: DC
  Administered 2016-12-21 – 2016-12-25 (×5): 1 mg via INTRAVENOUS
  Filled 2016-12-21 (×5): qty 0.2

## 2016-12-21 MED ORDER — THIAMINE HCL 100 MG/ML IJ SOLN
100.0000 mg | Freq: Every day | INTRAMUSCULAR | Status: DC
  Administered 2016-12-21 – 2016-12-25 (×5): 100 mg via INTRAVENOUS
  Filled 2016-12-21: qty 1
  Filled 2016-12-21: qty 2
  Filled 2016-12-21: qty 1
  Filled 2016-12-21 (×3): qty 2

## 2016-12-21 NOTE — Progress Notes (Signed)
eLink Physician-Brief Progress Note Patient Name: [name] DOB: 08-06-1946 MRN: [id number]   Date of Service  12/21/2016  HPI/Events of Note  Frequent PVC's.  eICU Interventions  Will order: 1. BMP and Mg++ level now.      Intervention Category Major Interventions: Arrhythmia - evaluation and management  [name] 12/21/2016, 9:24 PM

## 2016-12-21 NOTE — Progress Notes (Signed)
Called by Vita Barley. Pt had one episode hematemesis/melena. EGD APR 4 clean based ulcer. Bx taken. Pt on IV PPI. Pt had a syncopal episode after vomiting. SBp dropped to 70s. GIVEN FLUID BOLUS. SBP > 100. Being moved to unit. Spoke with Josph Macho. CALL IF PT HAS SBP <90 or has another episode of hematemesis. Await CBC. Agree with transfer to Step Down Unit.

## 2016-12-21 NOTE — Consult Note (Signed)
Chief Complaint: Patient was seen in consultation today for GI bleed  Referring Physician(s):  Dr. Sinda Du  Supervising Physician: Daryll Brod  Patient Status: [facility] - In-pt  History of Present Illness: [name] is a 71 y.o. male with past medical history of cirrhosis, CKD, and HLD who presents to [facility] with hematemesis and hypotension.   Patient underwent upper endoscopy which showed a large gastric ulcer with visible bleeding vessel which was injected.  Patient was transferred to [facility] for further management.  IR consulted for possible embolization due to high risk for rebleeding.  Patient is NPO.   Past Medical History:  Diagnosis Date  . Arteriosclerotic cardiovascular disease (ASCVD)    IMI in 5/07 with CTO mid cx, 60% LAD, 99% RCA-> BMS; mod. impaired LV function; EF:35-40% 2/10  . Chronic kidney disease    STAGE 3-4-creatinine 2.5 in 2009; 1.7 in 10/2008  . Cirrhosis ([place])    History of excessive alcohol use; continuing social use  . Gastroesophageal reflux disease   . Hyperlipidemia   . Tobacco abuse    60 pack years; one pack per day    Past Surgical History:  Procedure Laterality Date  . ESOPHAGOGASTRODUODENOSCOPY N/A 12/19/2016   Procedure: ESOPHAGOGASTRODUODENOSCOPY (EGD);  Surgeon: Rogene Houston, MD;  Location: [facility] SUITE;  Service: Endoscopy;  Laterality: N/A;    Allergies: Patient has no known allergies.  Medications: Prior to Admission medications   Medication Sig Start Date End Date Taking? Authorizing Provider  ASPIRIN LOW DOSE 81 MG EC tablet TAKE ONE TABLET BY MOUTH ONCE DAILY. 10/19/13  Yes Herminio Commons, MD  carvedilol (COREG) 12.5 MG tablet TAKE 1 TABLET BY MOUTH TWICE DAILY. 07/07/14  Yes Herminio Commons, MD  docusate sodium (COLACE) 100 MG capsule Take 1 capsule (100 mg total) by mouth 2 (two) times daily. Patient taking differently: Take 100 mg by mouth at bedtime.  12/17/16  Yes [name], MD  feeding  supplement, ENSURE ENLIVE, (ENSURE ENLIVE) LIQD Take 237 mLs by mouth 2 (two) times daily between meals. 12/17/16  Yes [name], MD  folic acid (FOLVITE) 1 MG tablet Take 1 tablet (1 mg total) by mouth daily. 12/17/16  Yes [name], MD  Multiple Vitamin (DAILY VITE) TABS TAKE ONE TABLET BY MOUTH ONCE DAILY.   Yes Lendon Colonel, NP  nitroGLYCERIN (NITROSTAT) 0.4 MG SL tablet Place 0.4 mg under the tongue as needed.     Yes Historical Provider, MD  pantoprazole (PROTONIX) 40 MG tablet TAKE ONE TABLET BY MOUTH ONCE DAILY. 01/18/14  Yes Lendon Colonel, NP  castor oil liquid Take 10 mLs by mouth daily as needed for moderate constipation.    Historical Provider, MD     History reviewed. No pertinent family history.  Social History   Social History  . Marital status: Single    Spouse name: N/A  . Number of children: N/A  . Years of education: N/A   Occupational History  . Retired    Social History Main Topics  . Smoking status: Current Every Day Smoker    Packs/day: 1.00    Years: 60.00  . Smokeless tobacco: Never Used  . Alcohol use 0.0 oz/week     Comment: per sister "he had one beer last week"  . Drug use: No  . Sexual activity: Not Asked   Other Topics Concern  . None   Social History Narrative   Resides in supervised living situation   No regular  exercise    Review of Systems  Constitutional: Positive for fatigue. Negative for fever.  Respiratory: Negative for cough and shortness of breath.   Cardiovascular: Negative for chest pain.  Gastrointestinal: Positive for blood in stool and vomiting. Negative for abdominal pain.  Psychiatric/Behavioral: Negative for behavioral problems and confusion.    Vital Signs: BP (!) 103/58   Pulse 75   Temp 97.6 F (36.4 C) (Oral)   Resp 13   Ht '5\' 7"'$  (1.702 m)   Wt 119 lb 4.3 oz (54.1 kg)   SpO2 100%   BMI 18.68 kg/m   Physical Exam  Constitutional: He is oriented to person, place, and time. He appears  well-developed.  Cardiovascular: Normal rate, regular rhythm and normal heart sounds.   Pulmonary/Chest: Effort normal and breath sounds normal. No respiratory distress.  Abdominal: Soft. Bowel sounds are normal.  Neurological: He is alert and oriented to person, place, and time.  Skin: Skin is warm and dry.  Psychiatric: He has a normal mood and affect. His behavior is normal. Judgment and thought content normal.  Nursing note and vitals reviewed.   Mallampati Score:  MD Evaluation Airway: WNL Heart: WNL Abdomen: WNL Chest/ Lungs: WNL ASA  Classification: 4 Mallampati/Airway Score: Four  Imaging: Ct Head Wo Contrast  Result Date: 12/18/2016 CLINICAL DATA:  Weakness.  No reported injury. EXAM: CT HEAD WITHOUT CONTRAST TECHNIQUE: Contiguous axial images were obtained from the base of the skull through the vertex without intravenous contrast. COMPARISON:  None. FINDINGS: Brain: No evidence of parenchymal hemorrhage or extra-axial fluid collection. No mass lesion, mass effect, or midline shift. No CT evidence of acute infarction. Nonspecific mild subcortical and periventricular white matter hypodensity, most in keeping with chronic small vessel ischemic change. Cerebral volume is age appropriate. No ventriculomegaly. Vascular: Intracranial atherosclerosis.  No acute abnormality Skull: No evidence of calvarial fracture. Sinuses/Orbits: The visualized paranasal sinuses are essentially clear. Other:  The mastoid air cells are unopacified. IMPRESSION: 1.  No evidence of acute intracranial abnormality. 2. Mild chronic small vessel ischemia. Electronically Signed   By: Ilona Sorrel M.D.   On: 12/18/2016 18:31   US Renal  Result Date: 12/14/2016 CLINICAL DATA:  Acute tubular necrosis, proteinuria a EXAM: RENAL / URINARY TRACT ULTRASOUND COMPLETE COMPARISON:  None. FINDINGS: Right Kidney: Length: 5.8 cm.  Right kidney is atrophic without hydronephrosis. Left Kidney: Length: 10.9 cm. Echogenicity  within normal limits. No mass or hydronephrosis visualized. Bladder: Appears normal for degree of bladder distention. Only left ureteral jet is visualized. IMPRESSION: 1. Atrophic right kidney without hydronephrosis measures 5.8 cm in length. 2. Normal size left kidney. No left hydronephrosis. Only left ureteral jet is visualized. The urinary bladder is unremarkable. Electronically Signed   By: Lahoma Crocker M.D.   On: 12/14/2016 10:45   Dg Chest Port 1 View  Result Date: 12/18/2016 CLINICAL DATA:  Shortness of breath and cough EXAM: PORTABLE CHEST 1 VIEW COMPARISON:  12/13/2016 FINDINGS: No acute infiltrate or effusion. Stable cardiomediastinal silhouette with atherosclerosis. No pneumothorax. IMPRESSION: No active disease. Electronically Signed   By: Donavan Foil M.D.   On: 12/18/2016 19:06   Dg Chest Port 1 View  Result Date: 12/13/2016 CLINICAL DATA:  Acute onset of unstable gait.  Initial encounter. EXAM: PORTABLE CHEST 1 VIEW COMPARISON:  Chest radiograph performed 04/06/2013 FINDINGS: The lungs are well-aerated and clear. There is no evidence of focal opacification, pleural effusion or pneumothorax. The cardiomediastinal silhouette is within normal limits. No acute osseous abnormalities are seen.  IMPRESSION: No acute cardiopulmonary process seen. Electronically Signed   By: Garald Balding M.D.   On: 12/13/2016 19:39    Labs:  CBC:  Recent Labs  12/15/16 1158 12/18/16 1716  12/19/16 0518 12/20/16 0830 12/21/16 0045 12/21/16 0542  WBC 4.2 5.3  --   --  5.4 7.3  --   HGB 11.7* 8.1*  < > 7.9* 10.1* 7.2* 8.6*  HCT 34.4* 23.2*  < > 22.6* 29.2* 21.9* 25.3*  PLT 142* 104*  --   --  100* 97*  --   < > = values in this interval not displayed.  COAGS:  Recent Labs  12/13/16 1857 12/19/16 0518 12/21/16 0045  INR 1.04 1.08 1.14  APTT  --   --  28    BMP:  Recent Labs  12/18/16 1721 12/19/16 0518 12/20/16 0830 12/21/16 0045  NA 137 139 136 135  K 5.2* 4.0 4.1 3.9  CL 108 109  107 106  CO2 '25 24 25 22  '$ GLUCOSE 98 92 105* 160*  BUN 58* 56* 29* 34*  CALCIUM 8.0* 8.1* 8.1* 7.6*  CREATININE 1.42* 1.22 1.17 1.34*  GFRNONAA 48* 57* >60 51*  GFRAA 55* >60 >60 59*    LIVER FUNCTION TESTS:  Recent Labs  12/13/16 1857 12/15/16 0605 12/16/16 0638 12/18/16 1721 12/21/16 0045  BILITOT 0.8  --   --  0.3 0.4  AST 18  --   --  21 25  ALT 11*  --   --  19 19  ALKPHOS 46  --   --  31* 25*  PROT 8.3*  --   --  5.3* 4.7*  ALBUMIN 3.4* 2.6* 2.7* 2.2* 2.0*    TUMOR MARKERS: No results for input(s): AFPTM, CEA, CA199, CHROMGRNA in the last 8760 hours.  Assessment and Plan: GI Bleed Patient from [facility] with bleeding gastric ulcer, vessel visible during EGD and was injected.  Patient has not had further bleeding/hematemesis since transfer. He has received 4u PRBCs.  His HgB increased from 7.2  8.6 after transfusion.  Repeat CBC planned. Reviewed case with Dr. Annamaria Boots.  Patient is at high risk for rebleeding but does not currently have evidence of active bleeding. Please re-consult if re-bleeding occurs for possible embolization.   Thank you for this interesting consult.  I greatly enjoyed meeting [name] and look forward to participating in their care.  A copy of this report was sent to the requesting provider on this date.  Electronically Signed: Docia Barrier 12/21/2016, 10:33 AM   I spent a total of 40 Minutes    in face to face in clinical consultation, greater than 50% of which was counseling/coordinating care for GI bleed.

## 2016-12-21 NOTE — H&P (Addendum)
Primary Care Physician:  Alonza Bogus, MD Primary Gastroenterologist:  Dr. Oneida Alar  Pre-Procedure History & Physical: HPI:  [name] is a 71 y.o. male here for HEMATEMESIS/LOW BP. Called by [facility] '@0100'$ . Pt had one episode hematemesis/melena. EGD APR 4 clean based ulcer. Bx taken. Pt on IV PPI. Pt had a syncopal episode after vomiting. SBP dropped to 70s. GIVEN FLUID BOLUS. SBP > 100. Being moved to unit. Spoke with Josph Macho. CALL IF PT HAS SBP <90 or has another episode of hematemesis. Await CBC. Agree with transfer to Step Down Unit. I PERSONALLY REVIEWED THE EGD IMAGES.   AFTER PT TRANSFERRED PT THREW UP BLOOD '@0217'$  WITH 2ND EPISODE  PRIOR TO MY ARRIVAL IN ICU. HAS HAD TWO EPISODE ON [place]LE[place] WITH SOME BRBPR. PT W/O COMPLAINT. ORDERED 2u pRBCS @ 999 ml/hr AND REGLAN 10 MG IV x1. ARRIVED TO ICU SBP 65. 1u PRBC ARRIVED. 0316 CONTACTED DR. Jimmy Footman. DISCUSSED CASE.  RECOMMENDED NEO-SYNEPHRINE OR LEVOPHED FOR BP SUPPORT. SHE PLACED ORDER FOR NEO-SYNEPHRINE. 0315: ENDO TEAM ARRIVED TO ICU. SBP 107 PRIOR TO Levophed ORDER.   Past Medical History:  Diagnosis Date  . Arteriosclerotic cardiovascular disease (ASCVD)    IMI in 5/07 with CTO mid cx, 60% LAD, 99% RCA-> BMS; mod. impaired LV function; EF:35-40% 2/10  . Chronic kidney disease    STAGE 3-4-creatinine 2.5 in 2009; 1.7 in 10/2008  . Cirrhosis ([place])    History of excessive alcohol use; continuing social use  . Gastroesophageal reflux disease   . Hyperlipidemia   . Tobacco abuse    60 pack years; one pack per day   Past Surgical History:  Procedure Laterality Date  . ESOPHAGOGASTRODUODENOSCOPY N/A 12/19/2016   Procedure: ESOPHAGOGASTRODUODENOSCOPY (EGD);  Surgeon: Rogene Houston, MD;  Location: [facility];  Service: Endoscopy;  Laterality: N/A;    Prior to Admission medications   Medication Sig Start Date End Date Taking? Authorizing Provider  ASPIRIN LOW DOSE 81 MG EC tablet TAKE ONE TABLET BY MOUTH ONCE DAILY. 10/19/13  Yes  Herminio Commons, MD  carvedilol (COREG) 12.5 MG tablet TAKE 1 TABLET BY MOUTH TWICE DAILY. 07/07/14  Yes Herminio Commons, MD  docusate sodium (COLACE) 100 MG capsule Take 1 capsule (100 mg total) by mouth 2 (two) times daily. Patient taking differently: Take 100 mg by mouth at bedtime.  12/17/16  Yes [name], MD  feeding supplement, ENSURE ENLIVE, (ENSURE ENLIVE) LIQD Take 237 mLs by mouth 2 (two) times daily between meals. 12/17/16  Yes [name], MD  folic acid (FOLVITE) 1 MG tablet Take 1 tablet (1 mg total) by mouth daily. 12/17/16  Yes [name], MD  Multiple Vitamin (DAILY VITE) TABS TAKE ONE TABLET BY MOUTH ONCE DAILY.   Yes Lendon Colonel, NP  nitroGLYCERIN (NITROSTAT) 0.4 MG SL tablet Place 0.4 mg under the tongue as needed.     Yes Historical Provider, MD  pantoprazole (PROTONIX) 40 MG tablet TAKE ONE TABLET BY MOUTH ONCE DAILY. 01/18/14  Yes Lendon Colonel, NP  castor oil liquid Take 10 mLs by mouth daily as needed for moderate constipation.    Historical Provider, MD   Allergies as of 12/18/2016  . (No Known Allergies)   History reviewed. No pertinent family history.  Social History   Social History  . Marital status: Single    Spouse name: N/A  . Number of children: N/A  . Years of education: N/A   Occupational History  . Retired    Science writer  History Main Topics  . Smoking status: Current Every Day Smoker    Packs/day: 1.00    Years: 60.00  . Smokeless tobacco: Never Used  . Alcohol use 0.0 oz/week     Comment: per sister "he had one beer last week"  . Drug use: No  . Sexual activity: Not on file   Other Topics Concern  . Not on file   Social History Narrative   Resides in supervised living situation   No regular exercise    Review of Systems: See HPI, otherwise negative ROS  Physical Exam: BP 104/68   Pulse 75   Temp 97.2 F (36.2 C) (Oral)   Resp 20   Ht '5\' 7"'$  (1.702 m)   Wt 119 lb 4.3 oz (54.1 kg)   SpO2 100%   BMI  18.68 kg/m  General:   Alert, cooperative in NAD, SISTER AT BEDSIDE Head:  Normocephalic and atraumatic. Neck:  Supple; Lungs:  Clear throughout to auscultation.    Heart:  Regular rate and rhythm. Abdomen:  Soft, nontender and nondistended. Normal bowel sounds, without guarding, and without rebound.   Neurologic:  Alert and  INTERACTIVE;  NO  NEW FOCAL DEFICITS  Impression/Plan:    HEMATEMESIS/LOW SBP  PLAN:  EGD TODAY. 0300 DISCUSSED WITH DRs. [place]. ATTEMPTED TO CONTACT NOK SISTER CAN CALL [phone].CELL-LVM. EGD TO BE PERFORMED UNDER EMERGENT CONSENT. CALLED HOME-LEFT MESSAGE. SISTER AT BEDSIDE . DISCUSSED PROCEDURE, BENEFITS, & RISKS: < 1% chance of medication reaction, bleeding,OR perforation, AND POSSIBLE NEED FOR EMERGENT SURGERY. CONSENT OBTAINED.   0400: spoke with Dr. Maudie Mercury. Bleeding stopped for now with epinephrine. [facility]  With ir/surgery consultation. Vessel not amenable to endoscopic intervention. Spoke with Dr. Vernard Gambles. Will assess pt when pt arrives. He should be contacted when pt arrives. Pt LEVOPHED 2 MCG/MIN CURRENT SBP 110-136 AFTER 2u PrbcS. Levophed held. TURN MIVFs DOWN TO 100 CC/HR.  0421: DISCUSSED WITH DR. CARL GESSNER. LARGE VISIBLE VESSEL IN GASTRIC ULCER BASE. AWARE OF THERAPEUTIC LIMITATIONS AT [facility]. VESSEL NOT AMENABLE TO TO ENDOSCOPIC THERAPY.   79: discussed plan with sister: [name]. SHE VOICED HER UNDERSTANDING.

## 2016-12-21 NOTE — Progress Notes (Signed)
[facility] arrived with pt from [facility]. Vitals and assessment stable, see flowsheet.

## 2016-12-21 NOTE — Consult Note (Signed)
Consultation  Referring Provider:  CCM Primary Care Physician:  Alonza Bogus, MD Primary Gastroenterologist:  Dr.Rehman /Dr.Fields  Reason for Consultation:   Acute GI bleed  HPI: MIKHAEL HENDRIKS is a 71 y.o. male who was transferred from [facility] this morning in setting of acute Major  upper GI bleed.. Patient was admitted to [facility] on 12/19/2016 with family complaint of extreme weakness and not eating. His hemoglobin was noted to be 7.9. He had been on baby aspirin at home. He had been hospitalized the week previous said [facility] with acute kidney injury secondary to dehydration and at the time of discharge hemoglobin was 11.7. Patient has history of alcoholic cirrhosis, cardiovascular disease and ongoing tobacco use. He underwent EGD on 12/19/2016 per Dr. Laural Golden with finding of a 2 cm hiatal hernia and a nonbleeding gastric ulcer which was pigmented at the base. Endoscopic therapy was not undertaken. He was also noted to have granular gastric mucosa. No varices line Unfortunately last evening patient required rapid response he was found in the bathroom lethargic with a large amount of bloody emesis and bloody stool. He was hypotensive with blood pressure in the 70s. Appendectomy was volume resuscitated, started on Levophed and transferred to the ICU. He underwent repeat EGD in about 4 AM today per Dr.FIelds with finding of a large visible vessel in the gastric ulcer base. The ulcer was oozing and there was red blood and clots in the stomach. The ulcer base was injected with epinephrine. Decision was made to transfer patient to [facility] in the event IR embolization becomes necessary.   IR has seen this patient, and is following, however as he is not actively bleeding at present there is no plan for intervention today Hemoglobin is 8.6 after 4 units of packed RBCs. Patient is currently comfortable, he has not had any evidence of active bleeding since arrival here, and  has been hemodynamically stable. He has not been on pressors since arrival here. He has no complaints of abdominal pain or nausea. I do not think he understands situation.    Past Medical History:  Diagnosis Date  . Arteriosclerotic cardiovascular disease (ASCVD)    IMI in 5/07 with CTO mid cx, 60% LAD, 99% RCA-> BMS; mod. impaired LV function; EF:35-40% 2/10  . Chronic kidney disease    STAGE 3-4-creatinine 2.5 in 2009; 1.7 in 10/2008  . Cirrhosis ([place])    History of excessive alcohol use; continuing social use  . Gastroesophageal reflux disease   . Hyperlipidemia   . Tobacco abuse    60 pack years; one pack per day    Past Surgical History:  Procedure Laterality Date  . ESOPHAGOGASTRODUODENOSCOPY N/A 12/19/2016   Procedure: ESOPHAGOGASTRODUODENOSCOPY (EGD);  Surgeon: Rogene Houston, MD;  Location: [facility]O [facility];  Service: Endoscopy;  Laterality: N/A;    Prior to Admission medications   Medication Sig Start Date End Date Taking? Authorizing Provider  ASPIRIN LOW DOSE 81 MG EC tablet TAKE ONE TABLET BY MOUTH ONCE DAILY. 10/19/13  Yes Herminio Commons, MD  carvedilol (COREG) 12.5 MG tablet TAKE 1 TABLET BY MOUTH TWICE DAILY. 07/07/14  Yes Herminio Commons, MD  docusate sodium (COLACE) 100 MG capsule Take 1 capsule (100 mg total) by mouth 2 (two) times daily. Patient taking differently: Take 100 mg by mouth at bedtime.  12/17/16  Yes [name], MD  feeding supplement, ENSURE ENLIVE, (ENSURE ENLIVE) LIQD Take 237 mLs by mouth 2 (two) times daily between  meals. 12/17/16  Yes [name], MD  folic acid (FOLVITE) 1 MG tablet Take 1 tablet (1 mg total) by mouth daily. 12/17/16  Yes [name], MD  Multiple Vitamin (DAILY VITE) TABS TAKE ONE TABLET BY MOUTH ONCE DAILY.   Yes Lendon Colonel, NP  nitroGLYCERIN (NITROSTAT) 0.4 MG SL tablet Place 0.4 mg under the tongue as needed.     Yes Historical Provider, MD  pantoprazole (PROTONIX) 40 MG tablet TAKE ONE TABLET BY MOUTH  ONCE DAILY. 01/18/14  Yes Lendon Colonel, NP  castor oil liquid Take 10 mLs by mouth daily as needed for moderate constipation.    Historical Provider, MD    Current Facility-Administered Medications  Medication Dose Route Frequency Provider Last Rate Last Dose  . 0.9 %  sodium chloride infusion   Intravenous Continuous Jani Gravel, MD 75 mL/hr at 12/21/16 0700    . acetaminophen (TYLENOL) tablet 650 mg  650 mg Oral Q6H PRN Roney Jaffe, MD       Or  . acetaminophen (TYLENOL) suppository 650 mg  650 mg Rectal Q6H PRN Roney Jaffe, MD      . bisacodyl (DULCOLAX) suppository 10 mg  10 mg Rectal Daily PRN Roney Jaffe, MD      . carvedilol (COREG) tablet 12.5 mg  12.5 mg Oral BID WC Roney Jaffe, MD   12.5 mg at 12/20/16 1731  . cefTRIAXone (ROCEPHIN) 1 g in dextrose 5 % 50 mL IVPB  1 g Intravenous Q24H Danie Binder, MD   1 g at 12/21/16 0354  . folic acid injection 1 mg  1 mg Intravenous Daily Alphonzo Grieve, MD   1 mg at 12/21/16 1145  . lidocaine (XYLOCAINE) 2 % viscous mouth solution           . MEDLINE mouth rinse  15 mL Mouth Rinse BID Kara Mead V, MD   15 mL at 12/21/16 1116  . meperidine (DEMEROL) 100 MG/ML injection           . midazolam (VERSED) 5 MG/5ML injection           . norepinephrine (LEVOPHED) 4 mg in dextrose 5 % 250 mL (0.016 mg/mL) infusion  0-10 mcg/min Intravenous Titrated Guadelupe Sabin Deterding, MD 7.5 mL/hr at 12/21/16 0330 2 mcg/min at 12/21/16 0330  . ondansetron (ZOFRAN) tablet 4 mg  4 mg Oral Q6H PRN Roney Jaffe, MD       Or  . ondansetron [facility]) injection 4 mg  4 mg Intravenous Q6H PRN Roney Jaffe, MD      . pantoprazole (PROTONIX) 80 mg in sodium chloride 0.9 % 250 mL (0.32 mg/mL) infusion  8 mg/hr Intravenous Continuous Jani Gravel, MD 25 mL/hr at 12/21/16 1154 8 mg/hr at 12/21/16 1154  . simethicone (MYLICON) 40 NI/6.2VO suspension           . sodium chloride 0.9 % bolus 1,000 mL  1,000 mL Intravenous Once Francine Graven, DO      . sucralfate  (CARAFATE) 1 GM/10ML suspension 1 g  1 g Oral TID WC & HS Rogene Houston, MD   1 g at 12/21/16 1145  . thiamine (B-1) injection 100 mg  100 mg Intravenous Daily Alphonzo Grieve, MD   100 mg at 12/21/16 1116    Allergies as of 12/18/2016  . (No Known Allergies)    History reviewed. No pertinent family history.  Social History   Social History  . Marital status: Single    Spouse name: N/A  .  Number of children: N/A  . Years of education: N/A   Occupational History  . Retired    Social History Main Topics  . Smoking status: Current Every Day Smoker    Packs/day: 1.00    Years: 60.00  . Smokeless tobacco: Never Used  . Alcohol use 0.0 oz/week     Comment: per sister "he had one beer last week"  . Drug use: No  . Sexual activity: Not on file   Other Topics Concern  . Not on file   Social History Narrative   Resides in supervised living situation   No regular exercise    Review of Systems: Pertinent positive and negative review of systems were noted in the above HPI section.  All other review of systems was otherwise negative.    Physical Exam: Vital signs in last 24 hours: Temp:  [95.9 F (35.5 C)-98.4 F (36.9 C)] 97.8 F (36.6 C) (04/06 1147) Pulse Rate:  [63-101] 80 (04/06 1200) Resp:  [12-30] 13 (04/06 1200) BP: (69-150)/(40-95) 109/61 (04/06 1200) SpO2:  [88 %-100 %] 100 % (04/06 1200) Weight:  [119 lb 4.3 oz (54.1 kg)] 119 lb 4.3 oz (54.1 kg) (04/06 0216) Last BM Date: 12/18/16 General:   Alert,  Well-developed, well-nourished,Elderly African-American male pleasant and cooperative in NAD Head:  Normocephalic and atraumatic. Eyes:  Sclera clear, no icterus.   Conjunctiva pale. Ears:  Normal auditory acuity. Nose:  No deformity, discharge,  or lesions. Mouth:  No deformity or lesions.   Neck:  Supple; no masses or thyromegaly. Lungs:  Clear throughout to auscultation.   No wheezes, crackles, or rhonchi. Heart:  Regular rate and rhythm; no murmurs, clicks,  rubs,  or gallops. Abdomen:  Soft,nontender, BS active,nonpalp mass or hsm.   Rectal:  Deferred  Msk:  Symmetrical without gross deformities. . Pulses:  Normal pulses noted. Extremities:  Without clubbing or edema. Neurologic:  Alert and  oriented x3;  grossly normal neurologically. Skin:  Intact without significant lesions or rashes.. Psych:  Alert and cooperative. Normal mood and affect.  Intake/Output from previous day: 04/05 0701 - 04/06 0700 In: 1612.8 [P.O.:120; I.V.:933.3; Blood:559.5] Out: 1350 [Urine:1350] Intake/Output this shift: Total I/O In: 563 [I.V.:513; IV Piggyback:50] Out: -   Lab Results:  Recent Labs  12/20/16 0830 12/21/16 0045 12/21/16 0542 12/21/16 1106  WBC 5.4 7.3  --  9.4  HGB 10.1* 7.2* 8.6* 8.6*  HCT 29.2* 21.9* 25.3* 25.6*  PLT 100* 97*  --  PENDING   BMET  Recent Labs  12/19/16 0518 12/20/16 0830 12/21/16 0045  NA 139 136 135  K 4.0 4.1 3.9  CL 109 107 106  CO2 '24 25 22  '$ GLUCOSE 92 105* 160*  BUN 56* 29* 34*  CREATININE 1.22 1.17 1.34*  CALCIUM 8.1* 8.1* 7.6*   LFT  Recent Labs  12/21/16 0045  PROT 4.7*  ALBUMIN 2.0*  AST 25  ALT 19  ALKPHOS 25*  BILITOT 0.4   PT/INR  Recent Labs  12/19/16 0518 12/21/16 0045  LABPROT 14.0 14.7  INR 1.08 1.14   Hepatitis Panel No results for input(s): HEPBSAG, HCVAB, HEPAIGM, HEPBIGM in the last 72 hours.   IMPRESSION:   #27  71 year old African-American male transferred here this morning from [facility] after acute major GI bleed last night with hypotension. Patient has received 4 units of packed RBCs and is currently stable without active bleeding. Exline He has a known gastric ulcer with a visible vessel in the base which was actively  oozing at the time of EGD earlier this morning. Lesion was injected, but not endoclipped. #2 anemia secondary to acute blood loss #3 recent hospitalization with acute kidney injury secondary to dehydration #4 EtOH induced cirrhosis-varices on  EGD, patient states not actively drinking #5 cardiovascular disease #6 smoker  PLAN: #1 keep nothing by mouth #2 IV PPI infusion time 72 hours #3 serial  hemoglobins and transfuse to keep hemoglobin 8 #4 IR has been consulted and are following him and would plan for embolization in the event of acute rebleed. GI will also consider repeat EGD prior to IR in event of rebleeding. We'll follow with you   [name]  12/21/2016, 12:08 PM   Attending physician's note   I have taken a history, examined the patient and reviewed the chart. I agree with the Advanced Practitioner's note, impression and recommendations. 71 year old male transferred from [facility] with acute upper GI bleeding. Noted gastric ulcer with clot and pigmentation at the bases, injected with epi. The patient was transferred here for embolization by IR. No hematemesis or bowel movement since he is here. Hgb remained stable. Continue PPI gtt for 72 hours If he develops recurrent bleed, can consider repeat EGD to see if the gastric ulcer amenable to endoscopic therapy prior to embolization by IR Nothing by mouth for now Can advance to clears tomorrow if no further bleeding   K Denzil Magnuson, MD [phone] Mon-Fri 8a-5p [phone] after 5p, weekends, holidays

## 2016-12-21 NOTE — Brief Op Note (Addendum)
12/18/2016 - 12/21/2016  4:32 AM  PATIENT:  [name]  71 y.o. male  PRE-OPERATIVE DIAGNOSIS:  Hematemesis  POST-OPERATIVE DIAGNOSIS:  UGIB DUE TO LARGE GASTRIC ULCER WITH VISIBLE VESSEL. HIGH RISK TO REBLEED. NOT AMENABLE TO ENDOSCOPIC THERAPY AT BEDSIDE.  PROCEDURE:  Procedure(s): ESOPHAGOGASTRODUODENOSCOPY (EGD) (N/A)  SURGEON:  Surgeon(s) and Role:    * Danie Binder, MD - Primary   MEDICATIONS USED:  DEMEROL 25 MG IV AND VERSED 3 MG IV, LEVOPHED 2 MCG/MIN, REGLAN 10 MG IV, ROCEPHIN 1 GM IV, EPINEPHRINE 10 MLs INJECTED AROUND BASE OF THE ULCER.   BLOOD ADMINISTERED: 2ND UNIT OF BLOOD INFUSING AT 999 ML/HR   PLAN OF CARE:  1. PROTONIX GTT 2. LEVOPHED IF NEEDED 3. CONTINUE ROCEPHIN 4. TRANSFER TO [facility] ICU.  DISCUSSED WITH DRs. KIM, IR, AND GI.  5. IF EGD/INTERVENTION CONSIDERED WOULD PERFORM IN OR WITH SURGERY IN HOUSE.

## 2016-12-21 NOTE — Progress Notes (Signed)
[facility] arrived to take patient to [facility]. Pt and vitals stable at this time. No more bloody emesis since EGD this morning. Pt left with both IVs intact and had NS and protonix drip running. Called report to receiving nurse.

## 2016-12-21 NOTE — H&P (Signed)
Name: [name] MRN: [id number] DOB: March 07, 1946    LOS: 3  PCCM ADMISSION NOTE  History of Present Illness: [name] is a 71 y.o. male with history of CAD/ RCA stent in 2007, ICM EF 20-25%, HTN, CKD w nonfunct L kidney, "cirrhosis", hx prior etoh abuse and HL. He was admitted to [facility] on 4/3 with hypotension and weakness. He was found to have + FOBT and worsened anemia from previous (Hgb of 8.4 from 11 one week prior). He underwent EGD which showed a large gastric ulcer that was not bleeding (no esophageal varices, normal duodenum); the ulcer was injected with epinephrin. Early 4/6 patient had  hematemesis, melena and BRBPR with a syncopal episode; repeat EGD showed gastric No further bleeding since EGD. Patient received 2 U PRBCs on 4/4 and 2 units early this morning 4/6.   Lines / Drains: Peripheral IV R forearm 4/4 Peripheral IV Left arm 4/5  Cultures: UCx 12/18/16>> no growth final  Antibiotics: Rocephin 4/6>>  Tests / Events: 4/3 - admitted to [facility] for fatigue; worsening anemia, +FOBT, 2U PRBCs 4/4 - EGD shows large, non bleeding gastric ulcer; injected with epi 4/6 - hematemesis, melena, BRPBR, syncope - EGD shows gastric ulcer with visible vessel; 2U PRBCs - transfer to [facility]  The patient is sedated, intubated and unable to provide history, which was obtained for available medical records.    Past Medical History:  Diagnosis Date  . Arteriosclerotic cardiovascular disease (ASCVD)    IMI in 5/07 with CTO mid cx, 60% LAD, 99% RCA-> BMS; mod. impaired LV function; EF:35-40% 2/10  . Chronic kidney disease    STAGE 3-4-creatinine 2.5 in 2009; 1.7 in 10/2008  . Cirrhosis ([place])    History of excessive alcohol use; continuing social use  . Gastroesophageal reflux disease   . Hyperlipidemia   . Tobacco abuse    60 pack years; one pack per day   Past Surgical History:  Procedure Laterality Date  . ESOPHAGOGASTRODUODENOSCOPY N/A 12/19/2016   Procedure:  ESOPHAGOGASTRODUODENOSCOPY (EGD);  Surgeon: Rogene Houston, MD;  [facility];  Service: Endoscopy;  Laterality: N/A;   Prior to Admission medications   Medication Sig Start Date End Date Taking? Authorizing Provider  ASPIRIN LOW DOSE 81 MG EC tablet TAKE ONE TABLET BY MOUTH ONCE DAILY. 10/19/13  Yes Herminio Commons, MD  carvedilol (COREG) 12.5 MG tablet TAKE 1 TABLET BY MOUTH TWICE DAILY. 07/07/14  Yes Herminio Commons, MD  docusate sodium (COLACE) 100 MG capsule Take 1 capsule (100 mg total) by mouth 2 (two) times daily. Patient taking differently: Take 100 mg by mouth at bedtime.  12/17/16  Yes [name], MD  feeding supplement, ENSURE ENLIVE, (ENSURE ENLIVE) LIQD Take 237 mLs by mouth 2 (two) times daily between meals. 12/17/16  Yes [name], MD  folic acid (FOLVITE) 1 MG tablet Take 1 tablet (1 mg total) by mouth daily. 12/17/16  Yes [name], MD  Multiple Vitamin (DAILY VITE) TABS TAKE ONE TABLET BY MOUTH ONCE DAILY.   Yes Lendon Colonel, NP  nitroGLYCERIN (NITROSTAT) 0.4 MG SL tablet Place 0.4 mg under the tongue as needed.     Yes Historical Provider, MD  pantoprazole (PROTONIX) 40 MG tablet TAKE ONE TABLET BY MOUTH ONCE DAILY. 01/18/14  Yes Lendon Colonel, NP  castor oil liquid Take 10 mLs by mouth daily as needed for moderate constipation.    Historical Provider, MD   Allergies No Known Allergies  Family  History History reviewed. No pertinent family history.  Social History  reports that he has been smoking.  He has a 60.00 pack-year smoking history. He has never used smokeless tobacco. He reports that he drinks alcohol. He reports that he does not use drugs.  Review Of Systems  11 points review of systems is negative with an exception of listed in HPI.  Vital Signs: Temp:  [95.9 F (35.5 C)-98.4 F (36.9 C)] 97.6 F (36.4 C) (04/06 0729) Pulse Rate:  [63-101] 72 (04/06 0520) Resp:  [12-30] 17 (04/06 0520) BP: (69-150)/(40-91) 134/71  (04/06 0520) SpO2:  [94 %-100 %] 100 % (04/06 0520) Weight:  [54.1 kg (119 lb 4.3 oz)] 54.1 kg (119 lb 4.3 oz) (04/06 0216) I/O last 3 completed shifts: In: 2032.8 [P.O.:240; I.V.:1233.3; Blood:559.5] Out: 2150 [Urine:2150]  Physical Examination: General:  Ill appearing, NAD Neuro:  CN 2-12 intact     Cardiovascular:  RRR, no murmurs, rubs or gallops Lungs:  CTAB, no increased work of breathing, no rales or rhonchi Abdomen:  Soft, NDNT Musculoskeletal:  Decreased muscle mass Skin:  intact  Ventilator settings:    Labs and Imaging:  Reviewed.  Please refer to the Assessment and Plan section for relevant results.  Assessment and Plan:  PULMONARY  ASSESSMENT: Stable PLAN:   Supplemental O2 for target sats >90%  CARDIOVASCULAR  ASSESSMENT: Bp stable off of levophed; H/H post transfusion 8.6/25.3 (fron 7.2/21.9) Echo 15-20% on 12/19/16 Echo PLAN:  Type and screen Serial CBC's Transfuse per ICU protocol  RENAL  ASSESSMENT:  Cr 1.34 this AM from 1.17 yesterday; hypomag PLAN:   Replete electrolytes as needed AM BMet  GASTROINTESTINAL  ASSESSMENT:  GI bleeding from gastric ulcer now with exposed vessel per EGD 4/6.  S/p epi inj to base of ulcer x2 No further hematemesis or bloody BM's since transfer. PLAN:   IR consult GI consult Serial CBC's for now; transfuse per ICU protocol Rocephin Protonix gtt Sucralfate NPO  HEMATOLOGIC  ASSESSMENT:  Acute anemia 2/2 upper GI bleed from gastric ulcer PLAN:  Type and screen Serial CBC's for now; transfuse for hgb<8 as low output HF. SCD's  INFECTIOUS  ASSESSMENT: afebrile, no signs of infection PLAN:   Continue monitoring for signs of infection  ENDOCRINE  ASSESSMENT:  NPO for GI bleed for now PLAN:   NPO for now  NEUROLOGIC  ASSESSMENT:  Stable PLAN:   Continue monitoring   Best practices / Disposition: -->ICU status under PCCM -->full code -->SCDs for DVT Px -->Protonix for GI Px -->diet -  NPO -->patient updated at bedside  The patient is critically ill with multiple organ systems failure and requires high complexity decision making for assessment and support, frequent evaluation and titration of therapies, application of advanced monitoring technologies and extensive interpretation of multiple databases. Critical Care Time devoted to patient care services described in this note is 33 minutes.  Jannae Fagerstrom 12/21/2016, 7:30 AM

## 2016-12-21 NOTE — Progress Notes (Signed)
In to see patient at this time. Per primary RN Vita Barley) patient had been ambulatory all night long while going to the bathroom. She noted the patient while on the toilet became lethargic and non responsive to command. The patient was noted to have a large bloody BM in the toilet, bloody emesis on the floor, and acting outside of his baseline.  Primary RN called Rapid at this time. Upon assessment of the patient, he was noted to be lethargic sitting in a large bloody BM. Th patient's BP was 77/34, HR 60's, and 02 sats 92 percent.  Patient to have EKG, 500 ml bolus NS, CBC, CMP, Troponin, PT/INR, and mag obtained at this time. Primary team Dr. Maudie Mercury notified of findings at this time, and patient to be moved to the step down for further observation.  Will continue to monitor the patient closely.

## 2016-12-21 NOTE — Progress Notes (Addendum)
Late Entry: Pt had black tarry stools. Nurse and tech helped pt back to bed. Pt sat down on bed and laid back on bed closing eyes. Tech obtained vitals and blood pressure was 77/43. Pt then vomited up blood. Rapid response was called on pt at 00:30.

## 2016-12-21 NOTE — Care Management Note (Signed)
Case Management Note  Patient Details  Name: JAARON OLESON MRN: [id number] Date of Birth: 23-Sep-1945  Subjective/Objective:     Pt admitted with  Upper GI bleed               Action/Plan:   Pt alert and oriented during assessment.  Pt states he stays home with family and is independent.  CM will continue to follow for discharge needs   Expected Discharge Date:                  Expected Discharge Plan:  Home/Self Care  In-House Referral:     Discharge planning Services  CM Consult  Post Acute Care Choice:    Choice offered to:     DME Arranged:    DME Agency:     HH Arranged:    HH Agency:     Status of Service:     If discussed at [employer] of Stay Meetings, dates discussed:    Additional Comments:  Maryclare Labrador, RN 12/21/2016, 3:47 PM

## 2016-12-21 NOTE — Progress Notes (Signed)
Attempted to notify family pt transferred to the [facility] ICU. Phoned sister Blanch Media, no answer.

## 2016-12-21 NOTE — Progress Notes (Signed)
Began first unit of blood at 0255. After initial 15 minutes Dr. Oneida Alar is at the bedside and wants blood running at 999cc

## 2016-12-21 NOTE — Op Note (Signed)
[facility] Patient Name: [name] Procedure Date: 12/21/2016 3:24 AM MRN: [id number] Date of Birth: March 01, 1946 Attending MD: Barney Drain , MD CSN: [id number] Age: 71 Admit Type: Inpatient Procedure:                Upper GI endoscopy WITH EPINEPHRINE INJECTION Indications:              Hematemesis, Hematochezia, Melena Providers:                Barney Drain, MD, Rosina Lowenstein, RN, Aram Candela Referring MD:             Jasper Loser. Luan Pulling, MD Medicines:                Meperidine 25 mg IV, Midazolam 3 mg IV, Rocephin 1                            g IV, REGLAN 10 MG IV, LEVOPHED GTT Complications:            No immediate complications. PT HAD EPISODE OF                            HYPOTENSION DURING EGD. LEVOPHED STARTED AT 2                            MCG/MIN Estimated Blood Loss:     Estimated blood loss was minimal DURING EGD. Procedure:                Pre-Anesthesia Assessment:                           - Prior to the procedure, a History and Physical                            was performed, and patient medications and                            allergies were reviewed. The patient's tolerance of                            previous anesthesia was also reviewed. The risks                            and benefits of the procedure and the sedation                            options and risks were discussed with the patient.                            All questions were answered, and informed consent                            was obtained. Prior Anticoagulants: The patient has                            taken no previous anticoagulant or antiplatelet  agents. ASA Grade Assessment: IV - A patient with                            severe systemic disease that is a constant threat                            to life. After reviewing the risks and benefits,                            the patient was deemed in satisfactory condition to     undergo the procedure. After obtaining informed                            consent, the endoscope was passed under direct                            vision. Throughout the procedure, the patient's                            blood pressure, pulse, and oxygen saturations were                            monitored continuously. The EG-[id number])                            scope was introduced through the mouth, and                            advanced to the second part of duodenum. The upper                            GI endoscopy was technically difficult and complex                            due to the patient's cardiovascular instability                            (hypotension). Successful completion of the                            procedure was aided by managing the patient's                            medical instability. The patient tolerated the                            procedure well. Scope In: 3:40:34 AM Scope Out: 3:40:49 AM Total Procedure Duration: 0 hours 0 minutes 15 seconds  Findings:      Red blood/clots found in the gastric fundus and in the gastric body.      One oozing cratered gastric ulcer with a visible vessel was found on the       lesser curvature of the stomach. Area was successfully injected with 10       mL  of a 1:10,000 solution of epinephrine for hemostasis.      The examined duodenum was normal.      No gross lesions were noted in the entire esophagus.      -SMALL AMOUNT OF FRESH BLOOD IN ESOPHAGUS AND DUODENUM. CARDIA/FUNDUS       OBSCURED BY CLOT. Impression:               - HYPOTENSION/HEMATEMESIS/MELENA DUE TO LARGE                            gastric ulcer with a visible vessel. Injected. Moderate Sedation:      Moderate (conscious) sedation was administered by the endoscopy nurse       and supervised by the endoscopist. The following parameters were       monitored: oxygen saturation, heart rate, blood pressure, and response       to care.  Total physician intraservice time was 40 minutes. Recommendation:           - Transfer patient to another hospital. ULCER NOT                            AMENABLE TO ENDOSCOPIC THERAPY. DISCUSSED WITH DRs.                            Maudie Mercury, DAVIS, CRNA DANIEL, HASSELL, AND GESSNER.                           - CONTINUE Protonix (pantoprazole): 8 mg/hr IV by                            continuous infusion.                           - Refer to an interventional radiologist.                           - Make the patient NPO starting today.                           - Continue present medications.                           - Return patient to ICU for ongoing care. Procedure Code(s):        --- Professional ---                           [phone], Esophagogastroduodenoscopy, flexible,                            transoral; with control of bleeding, any method                           99152, Moderate sedation services provided by the                            same physician or other qualified health care  professional performing the diagnostic or                            therapeutic service that the sedation supports,                            requiring the presence of an independent trained                            observer to assist in the monitoring of the                            patient's level of consciousness and physiological                            status; initial 15 minutes of intraservice time,                            patient age 23 years or older                           [phone], Moderate sedation services; each additional                            15 minutes intraservice time                           99153, Moderate sedation services; each additional                            15 minutes intraservice time Diagnosis Code(s):        --- Professional ---                           K92.2, Gastrointestinal hemorrhage, unspecified                            K25.4, Chronic or unspecified gastric ulcer with                            hemorrhage                           K92.0, Hematemesis                           K92.1, Melena (includes Hematochezia) CPT copyright 2016 [employer]. All rights reserved. The codes documented in this report are preliminary and upon coder review may  be revised to meet current compliance requirements. Barney Drain, MD Barney Drain, MD 12/21/2016 9:41:59 AM This report has been signed electronically. Number of Addenda: 0

## 2016-12-21 NOTE — Progress Notes (Signed)
Paged Nelson regarding frequent PVC's. New orders received. Will continue to monitor.  Arnell Sieving, RN

## 2016-12-21 NOTE — Progress Notes (Signed)
Late Entry: Dr. Maudie Mercury notified and normal saline bolus given for blood pressure. Rechecked pt bp and bp now 104/43 and patient started on Protonix dose and drip. . Labs obtained. See orders.

## 2016-12-22 ENCOUNTER — Encounter (HOSPITAL_COMMUNITY): Payer: Self-pay | Admitting: Gastroenterology

## 2016-12-22 ENCOUNTER — Encounter (HOSPITAL_COMMUNITY)
Admission: EM | Disposition: A | Discharge status: Home Health | Payer: Self-pay | Source: Home / Self Care | Attending: Pulmonary Disease

## 2016-12-22 DIAGNOSIS — N179 Acute kidney failure, unspecified: Secondary | ICD-10-CM

## 2016-12-22 DIAGNOSIS — K921 Melena: Secondary | ICD-10-CM

## 2016-12-22 DIAGNOSIS — I5022 Chronic systolic (congestive) heart failure: Secondary | ICD-10-CM

## 2016-12-22 HISTORY — PX: ESOPHAGOGASTRODUODENOSCOPY (EGD) WITH PROPOFOL: SHX5813

## 2016-12-22 LAB — CBC
HEMATOCRIT: 23.4 % — AB (ref 39.0–52.0)
HEMOGLOBIN: 8.1 g/dL — AB (ref 13.0–17.0)
MCH: 29.1 pg (ref 26.0–34.0)
MCHC: 34.6 g/dL (ref 30.0–36.0)
MCV: 84.2 fL (ref 78.0–100.0)
Platelets: 58 10*3/uL — ABNORMAL LOW (ref 150–400)
RBC: 2.78 MIL/uL — AB (ref 4.22–5.81)
RDW: 14.9 % (ref 11.5–15.5)
WBC: 6.6 10*3/uL (ref 4.0–10.5)

## 2016-12-22 LAB — BPAM RBC
BLOOD PRODUCT EXPIRATION DATE: 201804232359
BLOOD PRODUCT EXPIRATION DATE: 201805032359
Blood Product Expiration Date: 201805032359
Blood Product Expiration Date: 201805032359
ISSUE DATE / TIME: 201804040946
ISSUE DATE / TIME: 201804060244
ISSUE DATE / TIME: 201804041231
ISSUE DATE / TIME: 201804060319
UNIT TYPE AND RH: 1700
UNIT TYPE AND RH: 5100
UNIT TYPE AND RH: 5100
Unit Type and Rh: 5100

## 2016-12-22 LAB — BASIC METABOLIC PANEL
ANION GAP: 8 (ref 5–15)
BUN: 26 mg/dL — ABNORMAL HIGH (ref 6–20)
CALCIUM: 7.4 mg/dL — AB (ref 8.9–10.3)
CO2: 19 mmol/L — AB (ref 22–32)
Chloride: 111 mmol/L (ref 101–111)
Creatinine, Ser: 1.13 mg/dL (ref 0.61–1.24)
Glucose, Bld: 84 mg/dL (ref 65–99)
Potassium: 4.1 mmol/L (ref 3.5–5.1)
Sodium: 138 mmol/L (ref 135–145)

## 2016-12-22 LAB — TYPE AND SCREEN
ABO/RH(D): B POS
ANTIBODY SCREEN: NEGATIVE
UNIT DIVISION: 0
UNIT DIVISION: 0
Unit division: 0
Unit division: 0

## 2016-12-22 LAB — GLUCOSE, CAPILLARY
GLUCOSE-CAPILLARY: 101 mg/dL — AB (ref 65–99)
GLUCOSE-CAPILLARY: 107 mg/dL — AB (ref 65–99)
GLUCOSE-CAPILLARY: 91 mg/dL (ref 65–99)
GLUCOSE-CAPILLARY: 93 mg/dL (ref 65–99)
Glucose-Capillary: 93 mg/dL (ref 65–99)
Glucose-Capillary: 91 mg/dL (ref 65–99)

## 2016-12-22 LAB — MAGNESIUM: Magnesium: 1.9 mg/dL (ref 1.7–2.4)

## 2016-12-22 LAB — HEMOGLOBIN AND HEMATOCRIT, BLOOD
HEMATOCRIT: 23.9 % — AB (ref 39.0–52.0)
HEMATOCRIT: 24.1 % — AB (ref 39.0–52.0)
HEMATOCRIT: 23.5 % — AB (ref 39.0–52.0)
HEMOGLOBIN: 8.2 g/dL — AB (ref 13.0–17.0)
Hemoglobin: 8.2 g/dL — ABNORMAL LOW (ref 13.0–17.0)
Hemoglobin: 8.2 g/dL — ABNORMAL LOW (ref 13.0–17.0)

## 2016-12-22 LAB — PHOSPHORUS: Phosphorus: 2 mg/dL — ABNORMAL LOW (ref 2.5–4.6)

## 2016-12-22 LAB — PREPARE RBC (CROSSMATCH)

## 2016-12-22 SURGERY — ESOPHAGOGASTRODUODENOSCOPY (EGD) WITH PROPOFOL
Anesthesia: Moderate Sedation

## 2016-12-22 MED ORDER — SODIUM PHOSPHATES 45 MMOLE/15ML IV SOLN
20.0000 mmol | Freq: Once | INTRAVENOUS | Status: AC
  Administered 2016-12-22: 20 mmol via INTRAVENOUS
  Filled 2016-12-22: qty 6.67

## 2016-12-22 MED ORDER — BUTAMBEN-TETRACAINE-BENZOCAINE 2-2-14 % EX AERO
INHALATION_SPRAY | CUTANEOUS | Status: DC | PRN
  Administered 2016-12-22: 1 via TOPICAL

## 2016-12-22 MED ORDER — MIDAZOLAM HCL 5 MG/ML IJ SOLN
INTRAMUSCULAR | Status: AC
  Filled 2016-12-22: qty 2

## 2016-12-22 MED ORDER — FENTANYL CITRATE (PF) 100 MCG/2ML IJ SOLN
INTRAMUSCULAR | Status: DC | PRN
  Administered 2016-12-22 (×2): 25 ug via INTRAVENOUS

## 2016-12-22 MED ORDER — SODIUM CHLORIDE 0.9 % IV SOLN
Freq: Once | INTRAVENOUS | Status: AC
  Administered 2016-12-22: 01:00:00 via INTRAVENOUS

## 2016-12-22 MED ORDER — CALCIUM GLUCONATE 10 % IV SOLN
1.0000 g | Freq: Once | INTRAVENOUS | Status: DC
  Filled 2016-12-22: qty 10

## 2016-12-22 MED ORDER — SODIUM CHLORIDE 0.9 % IV SOLN
1.0000 g | Freq: Once | INTRAVENOUS | Status: AC
  Administered 2016-12-22: 1 g via INTRAVENOUS
  Filled 2016-12-22: qty 10

## 2016-12-22 MED ORDER — FENTANYL CITRATE (PF) 100 MCG/2ML IJ SOLN
INTRAMUSCULAR | Status: AC
  Filled 2016-12-22: qty 2

## 2016-12-22 MED ORDER — DIPHENHYDRAMINE HCL 50 MG/ML IJ SOLN
INTRAMUSCULAR | Status: DC | PRN
  Administered 2016-12-22: 25 mg via INTRAVENOUS

## 2016-12-22 MED ORDER — DIPHENHYDRAMINE HCL 50 MG/ML IJ SOLN
INTRAMUSCULAR | Status: AC
  Filled 2016-12-22: qty 1

## 2016-12-22 MED ORDER — CALCIUM GLUCONATE 10 % IV SOLN
1.0000 g | Freq: Once | INTRAVENOUS | Status: AC
  Administered 2016-12-22: 1 g via INTRAVENOUS
  Filled 2016-12-22: qty 10

## 2016-12-22 MED ORDER — MAGNESIUM SULFATE IN D5W 1-5 GM/100ML-% IV SOLN
1.0000 g | Freq: Once | INTRAVENOUS | Status: AC
  Administered 2016-12-22: 1 g via INTRAVENOUS
  Filled 2016-12-22: qty 100

## 2016-12-22 MED ORDER — MIDAZOLAM HCL 2 MG/2ML IJ SOLN
INTRAMUSCULAR | Status: DC | PRN
  Administered 2016-12-22 (×4): 1 mg via INTRAVENOUS

## 2016-12-22 NOTE — Progress Notes (Addendum)
Name: [name] MRN: [id number] DOB: 06-29-1946    LOS: 4  PCCM ADMISSION NOTE  History of Present Illness: [name] is a 71 y.o. male with history of CAD/ RCA stent in 2007, ICM EF 15-20%, HTN, CKD w nonfunct L kidney, "cirrhosis", hx prior etoh abuse and HL. He was admitted to [facility] on 4/3 with hypotension and weakness. He was found to have + FOBT and worsened anemia from previous (Hgb of 8.4 from 11 one week prior). He underwent EGD which showed a large gastric ulcer that was not bleeding (no esophageal varices, normal duodenum); the ulcer was injected with epinephrin. Early 4/6 patient had  hematemesis, melena and BRBPR with a syncopal episode; repeat EGD showed gastric No further bleeding since EGD. Patient received 2 U PRBCs on 4/4 and 2 units early this morning 4/6. Transferred to [facility] for further management.   Lines / Drains: Peripheral IV R forearm 4/4 Peripheral IV Left arm 4/5  Cultures: UCx 12/18/16>> no growth final  Antibiotics: Rocephin 4/6>>  Tests / Events: 4/3 - admitted to [facility] for fatigue; worsening anemia, +FOBT, 2U PRBCs 4/4 - EGD shows large, non bleeding gastric ulcer; injected with epi 4/6 - hematemesis, melena, BRPBR, syncope - EGD shows gastric ulcer with visible vessel; 2U PRBCs - transfer to [facility]  SUBJECTIVE : Tolerated pRBC last 24 hrs.  (-) obvious bleeding noted.  Comfortable.  (-) other subjective complaints.    Vital Signs: Temp:  [97.5 F (36.4 C)-98.5 F (36.9 C)] 97.8 F (36.6 C) (04/07 0753) Pulse Rate:  [60-94] 74 (04/07 0830) Resp:  [10-19] 15 (04/07 0830) BP: (89-125)/(43-86) 109/68 (04/07 0830) SpO2:  [100 %] 100 % (04/07 0830) Weight:  [54.4 kg (120 lb)] 54.4 kg (120 lb) (04/07 0500) I/O last 3 completed shifts: In: 4120.8 [I.V.:2966.3; Blood:894.5; IV Piggyback:260] Out: 2460 [Urine:2460]   Physical Examination: General:  Ill appearing, NAD. Comfortable.  Neuro:  CN 2-12 intact    (-) lateralizing signs.   Cardiovascular:  RRR, no murmurs, rubs or gallops Lungs:  CTAB, no increased work of breathing, occasional crackles.  Abdomen:  Soft, NDNT Musculoskeletal:  Decreased muscle mass Skin:  intact   Assessment and Plan:  PULMONARY ASSESSMENT:  No acute issues. PLAN:   Supplemental O2 for target sats >90%   CARDIOVASCULAR ASSESSMENT:  Chronic CHF, EF 15-20% with CAD/hypokinesis. Not in failure PLAN:  Keep euvolemic. May need diuresis.  Holding off on asa,  BP meds 2/2 bleed Keep K= 4, Mg = 2 at least.  Transfuse per ICU protocol    RENAL ASSESSMENT:   AKI, better PLAN:   Replete electrolytes as needed Keep euvolemic, may need diuresis   GASTROINTESTINAL ASSESSMENT:   UGI bleeding from gastric ulcer now with exposed vessel per EGD 4/6.  S/p epi inj to base of ulcer x2 PLAN:   GI consult appreciated.  Plan for EGD this am.  Holding off on IR pending EGD this am.  Protonix gtt Sucralfate NPO Keep Hb and Hct q6.  Rocephin for SBP prophylaxis   HEMATOLOGIC  ASSESSMENT:  Acute anemia 2/2 upper GI bleed from gastric ulcer PLAN:  Type and screen Hb and Hct q 6.  transfuse for hgb<8 as low output HF. SCD's   INFECTIOUS ASSESSMENT:  (-) acute issues PLAN:   Continue monitoring for signs of infection Rocephin for sbp prophylaxis   ENDOCRINE ASSESSMENT:   (-) issues PLAN:   NPO for now CBG q 4   NEUROLOGIC ASSESSMENT:  ETOH abuse.  PLAN:   Continue monitoring for signs of withdrawal.   Plan to observe post EGD.  If stable, may transfer to SDU.     Monica Becton, MD 12/22/2016, 9:53 AM [facility] Pager [phone] After 3 pm or if no answer, call [phone]

## 2016-12-22 NOTE — Progress Notes (Addendum)
Received a call about low hemoglobin, hypocalcemia, and 7 runs of v tach .  Hemoglobin 7.4. Not actively bleeding. Ordered 1 unit PRBCs.  EKG last night showed QTc 480. Calcium this evening is 7.1. I have ordered 1 g IV calcium and a follow up EKG for tomorrow morning.

## 2016-12-22 NOTE — Progress Notes (Signed)
Pt Hgb 7.4. Resident paged and aware.  Pt also had 7 beat run of Vtach. MD aware.  Will follow orders and continue to monitor.  Arnell Sieving, RN

## 2016-12-22 NOTE — Op Note (Signed)
[facility] Patient Name: [name] Procedure Date : 12/22/2016 MRN: [id number] Attending MD: Mauri Pole , MD Date of Birth: December 20, 1945 CSN: [id number] Age: 71 Admit Type: Inpatient Procedure:                Upper GI endoscopy Indications:              Recent gastrointestinal bleeding Providers:                Mauri Pole, MD, Elna Breslow, RN,                            William Dalton, Technician Referring MD:              Medicines:                Fentanyl 50 micrograms IV, Midazolam 4 mg IV,                            Diphenhydramine 25 mg IV Complications:            No immediate complications. Estimated Blood Loss:     Estimated blood loss was minimal. Procedure:                Pre-Anesthesia Assessment:                           - Prior to the procedure, a History and Physical                            was performed, and patient medications and                            allergies were reviewed. The patient's tolerance of                            previous anesthesia was also reviewed. The risks                            and benefits of the procedure and the sedation                            options and risks were discussed with the patient.                            All questions were answered, and informed consent                            was obtained. Prior Anticoagulants: The patient has                            taken no previous anticoagulant or antiplatelet                            agents. ASA Grade Assessment: IV - A patient with  severe systemic disease that is a constant threat                            to life. After reviewing the risks and benefits,                            the patient was deemed in satisfactory condition to                            undergo the procedure.                           After obtaining informed consent, the endoscope was                            passed  under direct vision. Throughout the                            procedure, the patient's blood pressure, pulse, and                            oxygen saturations were monitored continuously. The                            EG-[id number] ([id number]) scope was introduced through the                            mouth, and advanced to the second part of duodenum.                            The upper GI endoscopy was accomplished without                            difficulty. The patient tolerated the procedure                            well. Scope In: Scope Out: Findings:      The esophagus was normal.      One non-obstructing non-bleeding cratered gastric ulcer of significant       severity with a visible vessel was found on the lesser curvature of the       stomach and at the incisura. The lesion was 20 mm in largest dimension.       Coagulation for bleeding prevention using bipolar probe was successful       to obliterate the vissible vessel. For location marking, one hemostatic       clip was successfully placed (MR conditional) adjacent to the ulcer.       There was no bleeding at the end of the procedure.      The examined duodenum was normal. Impression:               - Normal esophagus.                           - Non-obstructing non-bleeding gastric ulcer with a  visible vessel. NSAID induced etiology. Treated                            with bipolar cautery. Clip (MR conditional) was                            placed adjacent to the ulcer for location marking                            to facilitate visulization on angio or CTA.                           - Normal examined duodenum.                           - No specimens collected. Moderate Sedation:      Moderate (conscious) sedation was administered by the endoscopy nurse       and supervised by the endoscopist. The following parameters were       monitored: oxygen saturation, heart rate, blood pressure, and  response       to care. Total physician intraservice time was 28 minutes. Recommendation:           - NPO today, then advance as tolerated to clear                            liquid diet tomorrow if Hgb remains stable.                           - Give Protonix (pantoprazole): 8 mg/hr IV by                            continuous infusion 72 hours.                           - Monitor Hgb q12h and transfuse as needed                           - Consult IR if has evidence of recurrent GI bleed                            for possible angio embolization                           - Continue present medications.                           - No aspirin, ibuprofen, naproxen, or other                            non-steroidal anti-inflammatory drugs. Procedure Code(s):        --- Professional ---                           [phone], Esophagogastroduodenoscopy, flexible,  transoral; with control of bleeding, any method                           99153, Moderate sedation services; each additional                            15 minutes intraservice time                           [id number], Moderate sedation services provided by the                            same physician or other qualified health care                            professional performing a gastrointestinal                            endoscopic service that sedation supports,                            requiring the presence of an independent trained                            observer to assist in the monitoring of the                            patient's level of consciousness and physiological                            status; initial 15 minutes of intra-service time;                            patient age 74 years or older (additional time may                            be reported with [phone], as appropriate)                           [phone], Unlisted procedure, stomach Diagnosis Code(s):        --- Professional ---                            T39.395S, Adverse effect of other nonsteroidal                            anti-inflammatory drugs [NSAID], sequela                           K25.4, Chronic or unspecified gastric ulcer with                            hemorrhage                           K92.2, Gastrointestinal  hemorrhage, unspecified CPT copyright 2016 American Medical Association. All rights reserved. The codes documented in this report are preliminary and upon coder review may  be revised to meet current compliance requirements. Mauri Pole, MD 12/22/2016 12:22:48 PM This report has been signed electronically. Number of Addenda: 0

## 2016-12-22 NOTE — Progress Notes (Signed)
PT Cancellation Note  Patient Details Name: [name] MRN: [id number] DOB: Jun 25, 1946   Cancelled Treatment:    Reason Eval/Treat Not Completed: Patient at procedure or test/unavailable.  Will return at later time for PT evaluation.   [name] 12/22/2016, 1:26 PM Carita Pian Sanjuana Kava, [place] Pager [phone]

## 2016-12-22 NOTE — Progress Notes (Signed)
Patient ID: [name], male   DOB: March 08, 1946, 71 y.o.   MRN: [id number]    Progress Note   Subjective   Feels fine, no c/o abdominal discomfort or nausea- he is unsure about stools. hgb drifted last night, transfused one unit - HGB 8.1 this am Had short run VT - 7 beats last night- Calcium low - given IV - still low this am as is Phos Prolonged QT noted   Objective   Vital signs in last 24 hours: Temp:  [97.5 F (36.4 C)-98.5 F (36.9 C)] 97.8 F (36.6 C) (04/07 0753) Pulse Rate:  [60-94] 74 (04/07 0830) Resp:  [10-19] 15 (04/07 0830) BP: (89-125)/(43-86) 109/68 (04/07 0830) SpO2:  [100 %] 100 % (04/07 0830) Weight:  [120 lb (54.4 kg)] 120 lb (54.4 kg) (04/07 0500) Last BM Date:  (pta) General:    AA  Male  in NAD Heart:  Regular rate and rhythm; no murmurs Lungs: Respirations even and unlabored, lungs CTA bilaterally Abdomen:  Soft, nontender and nondistended. Normal bowel sounds. Extremities:  Without edema. Neurologic:  Alert and oriented,  grossly normal neurologically. Psych:  Cooperative. Normal mood and affect.  Intake/Output from previous day: 04/06 0701 - 04/07 0700 In: 2961.3 [I.V.:2366.3; Blood:335; IV Piggyback:260] Out: 2460 [Urine:2460] Intake/Output this shift: Total I/O In: 100 [I.V.:100] Out: -   Lab Results:  Recent Labs  12/21/16 0045  12/21/16 1106 12/21/16 1700 12/21/16 2236 12/22/16 0604  WBC 7.3  --  9.4  --   --  6.6  HGB 7.2*  < > 8.6* 8.3* 7.4* 8.1*  HCT 21.9*  < > 25.6* 24.8* 21.2* 23.4*  PLT 97*  --  62*  --   --  58*  < > = values in this interval not displayed. BMET  Recent Labs  12/21/16 0045 12/21/16 2236 12/22/16 0604  NA 135 138 138  K 3.9 4.9 4.1  CL 106 112* 111  CO2 22 20* 19*  GLUCOSE 160* 100* 84  BUN 34* 34* 26*  CREATININE 1.34* 1.17 1.13  CALCIUM 7.6* 7.1* 7.4*   LFT  Recent Labs  12/21/16 0045  PROT 4.7*  ALBUMIN 2.0*  AST 25  ALT 19  ALKPHOS 25*  BILITOT 0.4   PT/INR  Recent Labs  12/21/16 0045  LABPROT 14.7  INR 1.14    Studies/Results: No results found.     Assessment / Plan:    #1  71 yo AA male with acute GI bleed secondary to gastric ulcer with visible vessel- s/p EGD with injection 4/6 (Dr Oneida Alar) Pt continues to slowly bleed past 24 hours  #2 anemia secondary to acute blood loss #3 Cirrhosis /ETOH #4 Hypocalcemia/hypophosphatemia- discussed with CCM - will replace IV prior to sedation #5 prolonged QT   Plan; Continue IV PPI infusion x 72 hours Will go ahead with repeat EGD this am - discussed with pt , and agreeable to proceed. Continue  Serial hgb's and transfuse to keep hgb 7-8 Continue IV Rocephin    Principal Problem:   Hypotension Active Problems:   Coronary artery disease   Chronic kidney disease   Tobacco abuse   Malnutrition of moderate degree   Volume depletion   Ischemic cardiomyopathy   Anemia   Heme + stool   Gastric ulcer   Acute blood loss anemia   Hemorrhagic shock and encephalopathy syndrome (HCC)   Acute upper GI bleed   Hematochezia     LOS: 4 days   [name]  12/22/2016,  8:45 AM    Attending physician's note   I have taken an interval history, reviewed the chart and examined the patient. I agree with the Advanced Practitioner's note, impression and recommendations.  Will proceed with EGD for endoscopic management of gastric ulcer with visible vessel. Hgb continues to down trend concerning for ongoing bleed. Gastric biopsies negative for H.pylori or malignancy The risks and benefits as well as alternatives of endoscopic procedure(s) have been discussed and reviewed. All questions answered. The patient agrees to proceed.   [name], MD [phone] Mon-Fri 8a-5p [phone] after 5p, weekends, holidays

## 2016-12-23 LAB — GLUCOSE, CAPILLARY
GLUCOSE-CAPILLARY: 106 mg/dL — AB (ref 65–99)
GLUCOSE-CAPILLARY: 93 mg/dL (ref 65–99)
Glucose-Capillary: 96 mg/dL (ref 65–99)
Glucose-Capillary: 93 mg/dL (ref 65–99)
Glucose-Capillary: 101 mg/dL — ABNORMAL HIGH (ref 65–99)

## 2016-12-23 LAB — BASIC METABOLIC PANEL
ANION GAP: 7 (ref 5–15)
BUN: 13 mg/dL (ref 6–20)
CO2: 22 mmol/L (ref 22–32)
Calcium: 7.3 mg/dL — ABNORMAL LOW (ref 8.9–10.3)
Chloride: 106 mmol/L (ref 101–111)
Creatinine, Ser: 1.17 mg/dL (ref 0.61–1.24)
GLUCOSE: 97 mg/dL (ref 65–99)
POTASSIUM: 3.6 mmol/L (ref 3.5–5.1)
Sodium: 135 mmol/L (ref 135–145)

## 2016-12-23 LAB — CBC
HCT: 21.3 % — ABNORMAL LOW (ref 39.0–52.0)
Hemoglobin: 7.4 g/dL — ABNORMAL LOW (ref 13.0–17.0)
MCH: 29.5 pg (ref 26.0–34.0)
MCHC: 34.7 g/dL (ref 30.0–36.0)
MCV: 84.9 fL (ref 78.0–100.0)
PLATELETS: 74 10*3/uL — AB (ref 150–400)
RBC: 2.51 MIL/uL — AB (ref 4.22–5.81)
RDW: 15 % (ref 11.5–15.5)
WBC: 7.6 10*3/uL (ref 4.0–10.5)

## 2016-12-23 LAB — HEMOGLOBIN AND HEMATOCRIT, BLOOD
HCT: 28.7 % — ABNORMAL LOW (ref 39.0–52.0)
HEMATOCRIT: 27 % — AB (ref 39.0–52.0)
Hemoglobin: 9.4 g/dL — ABNORMAL LOW (ref 13.0–17.0)
Hemoglobin: 10.2 g/dL — ABNORMAL LOW (ref 13.0–17.0)

## 2016-12-23 LAB — PREPARE RBC (CROSSMATCH)

## 2016-12-23 LAB — MAGNESIUM: MAGNESIUM: 1.8 mg/dL (ref 1.7–2.4)

## 2016-12-23 LAB — PHOSPHORUS: PHOSPHORUS: 3 mg/dL (ref 2.5–4.6)

## 2016-12-23 MED ORDER — SODIUM CHLORIDE 0.9 % IV SOLN
30.0000 meq | Freq: Once | INTRAVENOUS | Status: AC
  Administered 2016-12-23: 30 meq via INTRAVENOUS
  Filled 2016-12-23: qty 15

## 2016-12-23 MED ORDER — MAGNESIUM SULFATE IN D5W 1-5 GM/100ML-% IV SOLN
1.0000 g | Freq: Once | INTRAVENOUS | Status: AC
  Administered 2016-12-23: 1 g via INTRAVENOUS
  Filled 2016-12-23: qty 100

## 2016-12-23 MED ORDER — SODIUM CHLORIDE 0.9 % IV SOLN
Freq: Once | INTRAVENOUS | Status: AC
  Administered 2016-12-23: 10 mL/h via INTRAVENOUS

## 2016-12-23 NOTE — Progress Notes (Addendum)
Name: KAIEN [name] MRN: [id number] DOB: 1946/01/20    LOS: 5  PCCM PROGRESS NOTE  History of Present Illness: [name] is a 71 y.o. male with history of CAD/ RCA stent in 2007, ICM EF 15-20%, HTN, CKD w nonfunct L kidney, "cirrhosis", hx prior etoh abuse and HL. He was admitted to [facility] on 4/3 with hypotension and weakness. He was found to have + FOBT and worsened anemia from previous (Hgb of 8.4 from 11 one week prior). He underwent EGD which showed a large gastric ulcer that was not bleeding (no esophageal varices, normal duodenum); the ulcer was injected with epinephrin. Early 4/6 patient had  hematemesis, melena and BRBPR with a syncopal episode; repeat EGD showed gastric No further bleeding since EGD. Patient received 2 U PRBCs on 4/4 and 2 units early this morning 4/6. Transferred to [facility] for further management.   Lines / Drains: Peripheral IV R forearm 4/4 Peripheral IV Left arm 4/5  Cultures: UCx 12/18/16>> no growth final  Antibiotics: Rocephin 4/6>>  Tests / Events: 4/3 - admitted to [facility] for fatigue; worsening anemia, +FOBT, 2U PRBCs 4/4 - EGD shows large, non bleeding gastric ulcer; injected with epi 4/6 - hematemesis, melena, BRPBR, syncope - EGD shows gastric ulcer with visible vessel; 2U PRBCs - transfer to [facility] 4/7 - EGD - cautery of ulcer, clip placement for localization of vessel if IR indicated  SUBJECTIVE : Patient denies further stools or vomiting at all since admission, denies BRBPR. Has no abdominal pain, chest pain, shortness of breath, dizziness.  Vital Signs: Temp:  [97.7 F (36.5 C)-99 F (37.2 C)] 99 F (37.2 C) (04/08 0404) Pulse Rate:  [45-113] 84 (04/08 0630) Resp:  [10-25] 18 (04/08 0630) BP: (88-148)/(35-124) 114/57 (04/08 0630) SpO2:  [92 %-100 %] 100 % (04/08 0630) Weight:  [54 kg (119 lb)] 54 kg (119 lb) (04/08 0500) I/O last 3 completed shifts: In: 7829 [I.V.:3466.3; Blood:335; IV Piggyback:516.7] Out: 5621  [Urine:3660]   Physical Examination: General: Ill appearing, NAD. Comfortable.  Neuro:  CN 2-12 intact    Cardiovascular:  RRR, no murmurs, rubs or gallops; no LE edema Lungs:  CTAB, no increased work of breathing  Abdomen:  Soft, NDNT, BS intact Musculoskeletal:  Decreased muscle mass Skin:  intact  Assessment and Plan:  PULMONARY ASSESSMENT:  No acute issues. PLAN:   Supplemental O2 for target sats >90%   CARDIOVASCULAR ASSESSMENT:  Chronic CHF, EF 15-20% with CAD/hypokinesis PLAN:  Keep euvolemic; may need diuresis but appears stable at this time  Holding off on asa,  BP meds 2/2 bleed Keep K= 4, Mg = 2 at least.  Transfuse for goal Hgb >8    RENAL ASSESSMENT:   AKI resolved  Appears euvolemic PLAN:   Replete electrolytes as needed Goal K >4 and Mg >2   GASTROINTESTINAL ASSESSMENT:   UGI bleeding from gastric ulcer now with exposed vessel per EGD 4/6.  S/p epi inj to base of ulcer x2 EGD 4/7 with cautery of ulcer and vessel Hgb 7.4 from 8.2 this AM - no signs or symptoms of bleeding PLAN:    Protonix gtt x72hrs Sucralfate Hb and Hct q6.  Rocephin for SBP prophylaxis NPO for now; plan to advance to clears as tolerated once Hgb stabilizes   HEMATOLOGIC  ASSESSMENT:  Acute anemia 2/2 upper GI bleed from gastric ulcer Hgb 7.4 from 8.2 this AM - no signs or symptoms of bleeding PLAN:  Transfuse for Hgb < 8 due to low  output HF; 1U pRBC's this AM H/H q6 for now SCD's   INFECTIOUS ASSESSMENT:  (-) acute issues PLAN:   Continue monitoring for signs of infection Rocephin for sbp prophylaxis   ENDOCRINE ASSESSMENT:   (-) issues PLAN:   NPO for now CBG q 4   NEUROLOGIC ASSESSMENT:   ETOH abuse.  PLAN:   Continue monitoring for signs of withdrawal.    Alexander Grieve, MD IMTS - PGY1  ATTENDING NOTE / ATTESTATION NOTE :   I have discussed the case with the resident/APP  Dr. Jari Favre.   I agree with the resident/APP's  history,  physical examination, assessment, and plans.    I have edited the above note and modified it according to our agreed history, physical examination, assessment and plan.   Patient with bleeding  duodenal ulcer for which he has had endoscopy 3x because of slow GI bleed. He had endoscopy yesterday and there was no BLEEDING. Plan was to transfer today to telemetry but hemoglobin dropped from 8 to 7. He remains hemodynamically stable, not in distress, some crackles at the bases. Continue protonix drip. He got transfused 1 unit packed red blood cells this afternoon. If repeat hemoglobin is stable, he can transfer to telemetry. Patient has CHF, EF 15-20 percent, but is euvolemic and is no evidence of failure.   Family : No family at bedside.   Monica Becton, MD 12/23/2016, 7:50 PM [facility] Pager [phone] After 3 pm or if no answer, call [phone]

## 2016-12-23 NOTE — Progress Notes (Signed)
Patient ID: [name], male   DOB: Jan 23, 1946, 71 y.o.   MRN: [id number]    Progress Note   Subjective   Feels fine, no c/o nausea or abdominal pain No Bm's overnight HGB drifted to 7.4- one unit ordered   Objective   Vital signs in last 24 hours: Temp:  [97.7 F (36.5 C)-99 F (37.2 C)] 98.6 F (37 C) (04/08 0740) Pulse Rate:  [45-113] 77 (04/08 0700) Resp:  [10-25] 16 (04/08 0700) BP: (88-148)/(35-124) 99/53 (04/08 0700) SpO2:  [92 %-100 %] 100 % (04/08 0700) Weight:  [119 lb (54 kg)] 119 lb (54 kg) (04/08 0500) Last BM Date:  (pta) General:   AA male in NAD Heart:  Regular rate and rhythm; no murmurs Lungs: Respirations even and unlabored, lungs CTA bilaterally Abdomen:  Soft, nontender and nondistended. Normal bowel sounds. Extremities:  Without edema. Neurologic:  Alert and orientedx 3  grossly normal neurologically. Psych:  Cooperative. Normal mood and affect.  Intake/Output from previous day: 04/07 0701 - 04/08 0700 In: 1838.6 [I.V.:1531.9; IV Piggyback:306.7] Out: 2150 [Urine:2150] Intake/Output this shift: No intake/output data recorded.  Lab Results:  Recent Labs  12/21/16 1106  12/22/16 0604  12/22/16 1634 12/22/16 2236 12/23/16 0445  WBC 9.4  --  6.6  --   --   --  7.6  HGB 8.6*  < > 8.1*  < > 8.2* 8.2* 7.4*  HCT 25.6*  < > 23.4*  < > 24.1* 23.5* 21.3*  PLT 62*  --  58*  --   --   --  74*  < > = values in this interval not displayed. BMET  Recent Labs  12/21/16 2236 12/22/16 0604 12/23/16 0445  NA 138 138 135  K 4.9 4.1 3.6  CL 112* 111 106  CO2 20* 19* 22  GLUCOSE 100* 84 97  BUN 34* 26* 13  CREATININE 1.17 1.13 1.17  CALCIUM 7.1* 7.4* 7.3*   LFT  Recent Labs  12/21/16 0045  PROT 4.7*  ALBUMIN 2.0*  AST 25  ALT 19  ALKPHOS 25*  BILITOT 0.4   PT/INR  Recent Labs  12/21/16 0045  LABPROT 14.7  INR 1.14      Assessment / Plan:    #1 71 yo AA male with acute massive upper GI bleed secondary to large  gastric  ulcer with Visible vessel- transferred from [facility] 4/6 Stable s/p EGD with cautery and endoclip placement adjacent to ulcer 4/7.  No active hemorrhage but hgb has drifted  #2 anemia secondary to acute bleed- has required multiple units of blood #3 CAD #4 ETOH  induced cirrhosis - no varices #5 CKD #6 ICM - EF 25% #7 hypocalcemia, hypo magnesemia- replacing  Plan; start Clear liquids Continue serial HGB 's- keep hgb 8 Continue IV PPI infusion x 48 more hours If recurrent active bleed - needs IR to do emergent angio with embolization   Principal Problem:   Hypotension Active Problems:   Coronary artery disease   Chronic kidney disease   Tobacco abuse   Malnutrition of moderate degree   Volume depletion   Ischemic cardiomyopathy   Anemia   Heme + stool   Gastric ulcer   Acute blood loss anemia   Hemorrhagic shock and encephalopathy syndrome (HCC)   Acute upper GI bleed   Hematochezia   Chronic systolic congestive heart failure ([place])     LOS: 5 days   [name]  12/23/2016, 8:44 AM   Attending physician's note  I have taken an interval history, reviewed the chart and examined the patient. I agree with the Advanced Practitioner's note, impression and recommendations.  No bowel movement, hemoglobin slightly trended down otherwise no evidence of ongoing GI bleed. Okay to advance diet to clears Continue PPI drip for total 72 hours and then transition to twice daily Avoid nsaids He'll need follow-up with outpatient GI in [place], surveillance EGD in 3 months to document healing of the ulcer We'll sign off, please call with any questions K Denzil Magnuson, MD [phone] Mon-Fri 8a-5p [phone] after 5p, weekends, holidays

## 2016-12-23 NOTE — Evaluation (Signed)
Physical Therapy Evaluation Patient Details Name: [name] MRN: [id number] DOB: 1946-01-30 Today's Date: 12/23/2016   History of Present Illness  Patient is a 71 yo male admitted 12/18/16 with weakness, decreased appetite.  Patient with GIB. Patient transferred to [facility] on 12/21/16.  Patient with bloody stool and bloody emesis.  Patient with hypotension.  To ICU.     PMH:   CAD/ RCA stent in 2007, ICM EF 20-25%, HTN, CKD w nonfunct L kidney, "cirrhosis", hx prior etoh abuse and HL  Clinical Impression  Patient presents with problems listed below.  Will benefit from acute PT to maximize functional independence prior to return home with family.  Do not anticipate any f/u PT needs at d/c.    Follow Up Recommendations No PT follow up;Supervision for mobility/OOB    Equipment Recommendations  None recommended by PT    Recommendations for Other Services       Precautions / Restrictions Precautions Precautions: Fall Restrictions Weight Bearing Restrictions: No      Mobility  Bed Mobility Overal bed mobility: Modified Independent             General bed mobility comments: Increased time.  Transfers Overall transfer level: Modified independent Equipment used: None             General transfer comment: No physical assist needed.  Increased time.  Ambulation/Gait Ambulation/Gait assistance: Min guard;+2 safety/equipment Ambulation Distance (Feet): 110 Feet Assistive device: None Gait Pattern/deviations: Step-through pattern;Decreased stride length Gait velocity: decreased Gait velocity interpretation: Below normal speed for age/gender General Gait Details: Good gait pattern, balance.  Slightly slow gait speed.  No loss of balance during gait.  Stairs            Wheelchair Mobility    Modified Rankin (Stroke Patients Only)       Balance Overall balance assessment: History of Falls;Needs assistance         Standing balance support: No upper extremity  supported Standing balance-Leahy Scale: Good                               Pertinent Vitals/Pain Pain Assessment: No/denies pain    Home Living Family/patient expects to be discharged to:: Private residence Living Arrangements: Children;Other relatives (daughter and sister) Available Help at Discharge: Family;Available 24 hours/day Type of Home: House Home Access: Stairs to enter Entrance Stairs-Rails: Psychiatric nurse of Steps: 3 Home Layout: One level Home Equipment: Cane - single point;Walker - 2 wheels;Shower seat      Prior Function Level of Independence: Independent         Comments: sister drives     Hand Dominance   Dominant Hand: Right    Extremity/Trunk Assessment   Upper Extremity Assessment Upper Extremity Assessment: Overall WFL for tasks assessed    Lower Extremity Assessment Lower Extremity Assessment: Generalized weakness    Cervical / Trunk Assessment Cervical / Trunk Assessment: Normal  Communication   Communication: No difficulties  Cognition Arousal/Alertness: Awake/alert Behavior During Therapy: WFL for tasks assessed/performed Overall Cognitive Status: Within Functional Limits for tasks assessed                                 General Comments: Per chart, some memory loss.      General Comments      Exercises     Assessment/Plan    PT Assessment Patient  needs continued PT services  PT Problem List Decreased strength;Decreased balance;Decreased mobility;Cardiopulmonary status limiting activity       PT Treatment Interventions DME instruction;Gait training;Stair training;Functional mobility training;Therapeutic activities;Therapeutic exercise;Balance training;Patient/family education    PT Goals (Current goals can be found in the Care Plan section)  Acute Rehab PT Goals Patient Stated Goal: To go home. PT Goal Formulation: With patient Time For Goal Achievement: 12/30/16 Potential  to Achieve Goals: Good    Frequency Min 3X/week   Barriers to discharge        Co-evaluation               End of Session Equipment Utilized During Treatment: Gait belt Activity Tolerance: Patient tolerated treatment well Patient left: in chair;with call bell/phone within reach;with nursing/sitter in room Nurse Communication: Mobility status PT Visit Diagnosis: History of falling (Z91.81);Muscle weakness (generalized) (M62.81);Other abnormalities of gait and mobility (R26.89)    Time: 5284-1324 PT Time Calculation (min) (ACUTE ONLY): 14 min   Charges:   PT Evaluation $PT Eval Moderate Complexity: 1 Procedure     PT G Codes:        Carita Pian. Sanjuana Kava, [facility] Acute Rehab Services Pager [place] 12/23/2016, 1:57 PM

## 2016-12-23 NOTE — Progress Notes (Addendum)
Spoke with lab and was told that H&H collected at 245 could not be located for processing. There is apparently an order for H&H now which I do not see in the system that will be routed to phlebotomy STAT.  I tried calling phlebotomy to confirm though was unable to reach anyone at this time.  ADDENDUM 12/23/2016  5:59 PM:  Hb improved to 9. Transfer to tele per discussion with Dr. Corrie Dandy.

## 2016-12-24 ENCOUNTER — Encounter (HOSPITAL_COMMUNITY): Payer: Self-pay | Admitting: Gastroenterology

## 2016-12-24 ENCOUNTER — Ambulatory Visit (HOSPITAL_COMMUNITY): Payer: Medicare Other | Attending: Pulmonary Disease | Admitting: Physical Therapy

## 2016-12-24 LAB — CBC
HCT: 26.1 % — ABNORMAL LOW (ref 39.0–52.0)
Hemoglobin: 9 g/dL — ABNORMAL LOW (ref 13.0–17.0)
MCH: 29.2 pg (ref 26.0–34.0)
MCHC: 34.5 g/dL (ref 30.0–36.0)
MCV: 84.7 fL (ref 78.0–100.0)
PLATELETS: 85 10*3/uL — AB (ref 150–400)
RBC: 3.08 MIL/uL — ABNORMAL LOW (ref 4.22–5.81)
RDW: 14.9 % (ref 11.5–15.5)
WBC: 7.3 10*3/uL (ref 4.0–10.5)

## 2016-12-24 LAB — BASIC METABOLIC PANEL
Anion gap: 7 (ref 5–15)
BUN: 8 mg/dL (ref 6–20)
CALCIUM: 7.5 mg/dL — AB (ref 8.9–10.3)
CHLORIDE: 104 mmol/L (ref 101–111)
CO2: 23 mmol/L (ref 22–32)
CREATININE: 1.13 mg/dL (ref 0.61–1.24)
GFR calc non Af Amer: >60 mL/min (ref >60)
Glucose, Bld: 99 mg/dL (ref 65–99)
Potassium: 3.6 mmol/L (ref 3.5–5.1)
SODIUM: 134 mmol/L — AB (ref 135–145)

## 2016-12-24 LAB — HEMOGLOBIN AND HEMATOCRIT, BLOOD
HCT: 26.3 % — ABNORMAL LOW (ref 39.0–52.0)
HEMATOCRIT: 27 % — AB (ref 39.0–52.0)
HEMATOCRIT: 26.1 % — AB (ref 39.0–52.0)
HEMOGLOBIN: 9.3 g/dL — AB (ref 13.0–17.0)
Hemoglobin: 9.1 g/dL — ABNORMAL LOW (ref 13.0–17.0)
Hemoglobin: 9 g/dL — ABNORMAL LOW (ref 13.0–17.0)

## 2016-12-24 LAB — GLUCOSE, CAPILLARY
GLUCOSE-CAPILLARY: 86 mg/dL (ref 65–99)
GLUCOSE-CAPILLARY: 88 mg/dL (ref 65–99)
GLUCOSE-CAPILLARY: 93 mg/dL (ref 65–99)
Glucose-Capillary: 96 mg/dL (ref 65–99)
Glucose-Capillary: 110 mg/dL — ABNORMAL HIGH (ref 65–99)
Glucose-Capillary: 113 mg/dL — ABNORMAL HIGH (ref 65–99)

## 2016-12-24 LAB — MAGNESIUM: MAGNESIUM: 1.8 mg/dL (ref 1.7–2.4)

## 2016-12-24 LAB — PHOSPHORUS: PHOSPHORUS: 2.2 mg/dL — AB (ref 2.5–4.6)

## 2016-12-24 MED ORDER — PANTOPRAZOLE SODIUM 40 MG IV SOLR
40.0000 mg | Freq: Two times a day (BID) | INTRAVENOUS | Status: DC
  Administered 2016-12-24 – 2016-12-25 (×3): 40 mg via INTRAVENOUS
  Filled 2016-12-24 (×3): qty 40

## 2016-12-24 NOTE — Care Management Important Message (Signed)
Important Message  Patient Details  Name: [name] MRN: [id number] Date of Birth: 1946/04/28   Medicare Important Message Given:  Yes    Nathen May 12/24/2016, 4:48 PM

## 2016-12-24 NOTE — Progress Notes (Signed)
Name: [name] MRN: [id number] DOB: 1946/06/11    LOS: 6  PCCM PROGRESS NOTE  History of Present Illness: [name] is a 71 y.o. male with history of CAD/ RCA stent in 2007, ICM EF 15-20%, HTN, CKD w nonfunct L kidney, "cirrhosis", hx prior etoh abuse and HL. He was admitted to [facility] on 4/3 with hypotension and weakness. He was found to have + FOBT and worsened anemia from previous (Hgb of 8.4 from 11 one week prior). He underwent EGD which showed a large gastric ulcer that was not bleeding (no esophageal varices, normal duodenum); the ulcer was injected with epinephrin. Early 4/6 patient had  hematemesis, melena and BRBPR with a syncopal episode; repeat EGD showed gastric No further bleeding since EGD. Patient received 2 U PRBCs on 4/4 and 2 units early this morning 4/6. Transferred to [facility] for further management.    Lines / Drains: Peripheral IV R forearm 4/4 Peripheral IV Left arm 4/5  Cultures: UCx 12/18/16>> no growth final  Antibiotics: Rocephin 4/6>>  Tests / Events: 4/3 - admitted to [facility] for [facility]; worsening anemia, +FOBT, 2U PRBCs 4/4 - EGD shows large, non bleeding gastric ulcer; injected with epi 4/6 - hematemesis, melena, BRPBR, syncope - EGD shows gastric ulcer with visible vessel; 2U PRBCs - transfer to [facility] 4/7 - EGD - cautery of ulcer, clip placement for localization of vessel if IR indicated  SUBJECTIVE : Patient denies further stools or vomiting at all since admission, denies BRBPR. Has no abdominal pain, chest pain, shortness of breath, dizziness. Comfortable.  His Hb dropped 1 gm w/o obvious bleed on 4/8 so we transfused 1 u pRBC.  Hb has been stable since.  Transferred to telemetry overnight.    Vital Signs: Temp:  [97.8 F (36.6 C)-98.4 F (36.9 C)] 98.4 F (36.9 C) (04/09 0548) Pulse Rate:  [69-96] 96 (04/09 0549) Resp:  [11-20] 18 (04/09 0548) BP: (67-134)/(44-77) 102/60 (04/09 0549) SpO2:  [97 %-100 %] 97 % (04/09 0549) Weight:   [56.7 kg (125 lb)] 56.7 kg (125 lb) (04/09 0548) I/O last 3 completed shifts: In: 1593.5 [P.O.:250; I.V.:681.9; Blood:335; IV Piggyback:326.6] Out: 1150 [Urine:1150]   Physical Examination: General: chronicall ill, NAD. Comfortable.  Neuro:  CN grossly intact.  (-) lateralizing signs   Cardiovascular:  RRR, no murmurs, rubs or gallops; no LE edema Lungs:  CTAB, no increased work of breathing  Abdomen:  Soft, NDNT, BS intact Musculoskeletal:  Decreased muscle mass Skin:  intact  Assessment and Plan:  UGI bleeding from Gastric Ulcer now with exposed vessel per EGD 4/6.  S/P epi inj to base of ulcer x2 EGD 4/7 with cautery of ulcer and vessel PLAN:    Protonix gtt was stopped on 4/8.  Start Protonix IV BID on 4/9. Transition to PO in 1-2 days.  Cont Sucralfate GI service has signed off.  Needs outpt f/u with GI [place].  Advance diet as tolerated Rocephin for SBP prophylaxis > plan 5 days (until 4/10)   Chronic CHF, EF 15-20% with CAD/hypokinesis PLAN:  Keep euvolemic; may need diuresis but appears stable at this time  Cont coreg Holding off on asa given recent bleed. Hold off on ace inhibitor and lasix  given recent hypotension.  Keep K= 4, Mg = 2 at least.  Transfuse for goal Hgb >8    AKI resolved  PLAN:   Replete electrolytes as needed Goal K >4 and Mg >2 Keep euvolemic   ETOH abuse.  PLAN:  Continue monitoring for signs of withdrawal.    Cont telemetry for today.  Pt wants to eventually go home with services.  Will transfer service to [facility]. TRH will be primary on 4/10, PCCM off then. I discussed case with Dr. Thereasa Solo.   Family : No family at bedside.   Monica Becton, MD 12/24/2016, 10:52 AM [facility] Pager [phone] After 3 pm or if no answer, call [phone]

## 2016-12-24 NOTE — Progress Notes (Signed)
Patient arrived by wheelchair from transferring unit at 21:21 on 12/23/16.  Oriented to unit, skin intact, protonix drip running via left arm IV.  Alert and oriented x 4.  Denies pain, call bell within reach. Wilson Singer, RN

## 2016-12-24 NOTE — Progress Notes (Signed)
qPhysical Therapy Treatment Patient Details Name: [name] MRN: [id number] DOB: 1946-03-31 Today's Date: 12/24/2016    History of Present Illness Patient is a 71 yo male admitted 12/18/16 with weakness, decreased appetite.  Patient with GIB. Patient transferred to [facility] on 12/21/16.  Patient with bloody stool and bloody emesis.  Patient with hypotension.  To ICU.     PMH:   CAD/ RCA stent in 2007, ICM EF 20-25%, HTN, CKD w nonfunct L kidney, "cirrhosis", hx prior etoh abuse and HL    PT Comments    Pt performed increased mobility and performed stair training in prep for d/c home.  Plan for no follow up remains appropriate.  Informed nursing that patient remains unsteady and would benefit from walking multiple times per day with staff.  NO LOB noted but remains to drift and stagger but able to right his balance.      Follow Up Recommendations  No PT follow up;Supervision for mobility/OOB     Equipment Recommendations  None recommended by PT    Recommendations for Other Services       Precautions / Restrictions Precautions Precautions: Fall Restrictions Weight Bearing Restrictions: No    Mobility  Bed Mobility Overal bed mobility: Modified Independent             General bed mobility comments: Increased time.  Transfers Overall transfer level: Modified independent Equipment used: None             General transfer comment: No physical assist needed.  Increased time.  Ambulation/Gait Ambulation/Gait assistance: Supervision;Min guard Ambulation Distance (Feet): 350 Feet Assistive device: None Gait Pattern/deviations: Step-through pattern;Decreased stride length;Drifts right/left;Trunk flexed Gait velocity: decreased Gait velocity interpretation: Below normal speed for age/gender General Gait Details: Pt unsteady but no overt LOB noted. Pt required cues for reciprocal armswing, upper trunk control and increasing stride length.     Stairs Stairs: Yes   Stair  Management: One rail Right;Alternating pattern Number of Stairs: 4 General stair comments: Cues for safety.  Pt reports he does not have rails at home but he remains impulsive and quick to climb stairs using R railing.  Plan next session to perform without railings.    Wheelchair Mobility    Modified Rankin (Stroke Patients Only)       Balance Overall balance assessment: History of Falls;Needs assistance Sitting-balance support: Feet unsupported       Standing balance support: No upper extremity supported Standing balance-Leahy Scale: Good                              Cognition Arousal/Alertness: Awake/alert Behavior During Therapy: WFL for tasks assessed/performed Overall Cognitive Status: Within Functional Limits for tasks assessed                                        Exercises      General Comments        Pertinent Vitals/Pain Pain Assessment: No/denies pain    Home Living                      Prior Function            PT Goals (current goals can now be found in the care plan section) Acute Rehab PT Goals Patient Stated Goal: To go home. Potential to Achieve Goals: Good Progress towards  PT goals: Progressing toward goals    Frequency    Min 3X/week      PT Plan Current plan remains appropriate    Co-evaluation             End of Session   Activity Tolerance: Patient tolerated treatment well Patient left: in chair;with call bell/phone within reach;with nursing/sitter in room Nurse Communication: Mobility status PT Visit Diagnosis: History of falling (Z91.81);Muscle weakness (generalized) (M62.81);Other abnormalities of gait and mobility (R26.89)     Time: 0300-9233 PT Time Calculation (min) (ACUTE ONLY): 18 min  Charges:  $Gait Training: 8-22 mins                    G Codes:       Governor Rooks, PTA pager [phone]    Cristela Blue 12/24/2016, 1:01 PM

## 2016-12-24 NOTE — Progress Notes (Signed)
Pt transferred to 5W22 via wheelchair with RN and NT. Pt in bed with bed alarm on, call bell within reach. No issues during transfer. Report given to receiving RN .   Arnell Sieving, RN

## 2016-12-25 ENCOUNTER — Encounter (HOSPITAL_COMMUNITY): Payer: Self-pay | Admitting: Gastroenterology

## 2016-12-25 LAB — CBC
HEMATOCRIT: 26 % — AB (ref 39.0–52.0)
Hemoglobin: 9 g/dL — ABNORMAL LOW (ref 13.0–17.0)
MCH: 29.5 pg (ref 26.0–34.0)
MCHC: 34.6 g/dL (ref 30.0–36.0)
MCV: 85.2 fL (ref 78.0–100.0)
Platelets: 101 10*3/uL — ABNORMAL LOW (ref 150–400)
RBC: 3.05 MIL/uL — ABNORMAL LOW (ref 4.22–5.81)
RDW: 15 % (ref 11.5–15.5)
WBC: 6.7 10*3/uL (ref 4.0–10.5)

## 2016-12-25 LAB — TYPE AND SCREEN
ABO/RH(D): B POS
ANTIBODY SCREEN: NEGATIVE
UNIT DIVISION: 0
UNIT DIVISION: 0
Unit division: 0
Unit division: 0

## 2016-12-25 LAB — HEMOGLOBIN AND HEMATOCRIT, BLOOD
HCT: 28.2 % — ABNORMAL LOW (ref 39.0–52.0)
HEMATOCRIT: 26.3 % — AB (ref 39.0–52.0)
HEMATOCRIT: 26.5 % — AB (ref 39.0–52.0)
Hemoglobin: 9 g/dL — ABNORMAL LOW (ref 13.0–17.0)
Hemoglobin: 9.6 g/dL — ABNORMAL LOW (ref 13.0–17.0)
Hemoglobin: 9 g/dL — ABNORMAL LOW (ref 13.0–17.0)

## 2016-12-25 LAB — BASIC METABOLIC PANEL
ANION GAP: 8 (ref 5–15)
BUN: 9 mg/dL (ref 6–20)
CO2: 22 mmol/L (ref 22–32)
Calcium: 7.6 mg/dL — ABNORMAL LOW (ref 8.9–10.3)
Chloride: 106 mmol/L (ref 101–111)
Creatinine, Ser: 1.27 mg/dL — ABNORMAL HIGH (ref 0.61–1.24)
GFR calc Af Amer: >60 mL/min (ref >60)
GFR, EST NON AFRICAN AMERICAN: 55 mL/min — AB (ref >60)
Glucose, Bld: 84 mg/dL (ref 65–99)
POTASSIUM: 4 mmol/L (ref 3.5–5.1)
Sodium: 136 mmol/L (ref 135–145)

## 2016-12-25 LAB — BPAM RBC
BLOOD PRODUCT EXPIRATION DATE: 201805092359
Blood Product Expiration Date: 201805042359
Blood Product Expiration Date: 201805092359
Blood Product Expiration Date: 201805092359
ISSUE DATE / TIME: 201804070059
ISSUE DATE / TIME: 201804081058
UNIT TYPE AND RH: 7300
Unit Type and Rh: 7300
Unit Type and Rh: 7300
Unit Type and Rh: 7300

## 2016-12-25 LAB — GLUCOSE, CAPILLARY
GLUCOSE-CAPILLARY: 112 mg/dL — AB (ref 65–99)
GLUCOSE-CAPILLARY: 124 mg/dL — AB (ref 65–99)
GLUCOSE-CAPILLARY: 134 mg/dL — AB (ref 65–99)
GLUCOSE-CAPILLARY: 141 mg/dL — AB (ref 65–99)
GLUCOSE-CAPILLARY: 74 mg/dL (ref 65–99)
Glucose-Capillary: 86 mg/dL (ref 65–99)
Glucose-Capillary: 89 mg/dL (ref 65–99)

## 2016-12-25 LAB — MAGNESIUM: Magnesium: 1.8 mg/dL (ref 1.7–2.4)

## 2016-12-25 LAB — PHOSPHORUS: Phosphorus: 2.5 mg/dL (ref 2.5–4.6)

## 2016-12-25 MED ORDER — FOLIC ACID 1 MG PO TABS
1.0000 mg | ORAL_TABLET | Freq: Every day | ORAL | Status: DC
  Administered 2016-12-26: 1 mg via ORAL
  Filled 2016-12-25 (×2): qty 1

## 2016-12-25 MED ORDER — PANTOPRAZOLE SODIUM 40 MG PO TBEC
40.0000 mg | DELAYED_RELEASE_TABLET | Freq: Two times a day (BID) | ORAL | Status: DC
  Administered 2016-12-25 – 2016-12-26 (×2): 40 mg via ORAL
  Filled 2016-12-25 (×3): qty 1

## 2016-12-25 MED ORDER — MAGNESIUM OXIDE 400 (241.3 MG) MG PO TABS
400.0000 mg | ORAL_TABLET | Freq: Every day | ORAL | Status: DC
  Administered 2016-12-25 – 2016-12-26 (×2): 400 mg via ORAL
  Filled 2016-12-25 (×2): qty 1

## 2016-12-25 MED ORDER — VITAMIN B-1 100 MG PO TABS
100.0000 mg | ORAL_TABLET | Freq: Every day | ORAL | Status: DC
  Administered 2016-12-26: 100 mg via ORAL
  Filled 2016-12-25 (×2): qty 1

## 2016-12-25 NOTE — Progress Notes (Addendum)
[facility] PROGRESS NOTE  [name]:[id number] DOB: 13-Mar-1946 DOA: 12/18/2016   PCP: Alexander Bogus, MD     Assessment/Plan: Principal Problem:   Hypotension Active Problems:   Coronary artery disease   Chronic kidney disease   Tobacco abuse   Malnutrition of moderate degree   Volume depletion   Ischemic cardiomyopathy   Anemia   Heme + stool   Gastric ulcer   Acute blood loss anemia   Hemorrhagic shock and encephalopathy syndrome (HCC)   Acute upper GI bleed   Hematochezia   Chronic systolic congestive heart failure ([place])    Alexander Payment Blackwellis a 71 y.o.malewith history of CAD/ RCA stent in 2007, ICM EF 15-20%, HTN, CKD w nonfunct L kidney, "cirrhosis", hx prior etoh abuse and HL. He was admitted to [facility] on 4/3 with hypotension and weakness. He was found to have + FOBT and worsened anemia from previous (Hgb of 8.4 from 11 one week prior). He underwent EGD which showed a large gastric ulcer that was not bleeding (no esophageal varices, normal duodenum); the ulcer was injected with epinephrin. Early 4/6 patient had  hematemesis, melena and BRBPR with a syncopal episode; repeat EGD showed gastric No further bleeding since EGD. Patient received 2 U PRBCs on 4/4 and 2 units early this morning 4/6. Transferred to [facility] for further management.  TRH assumed to be primary on 4/10   Assessment and plan  UGI bleeding from Gastric Ulcer now with exposed vessel per EGD 4/6.  S/P epi inj to base of ulcer x2 EGD 4/7 with cautery of ulcer and vessel Protonix gtt was stopped on 4/8.   now on Protonix IV BID on 4/9. Transition to PO   Cont Sucralfate GI service has signed off.  Needs outpt f/u with GI [place].  Advance diet as tolerated Rocephin for SBP prophylaxis > plan 5 days (until 4/10)   Chronic CHF, EF 15-20% with CAD/hypokinesis may need diuresis but appears stable at this time  Cont coreg Holding off on asa given recent bleed. Hold off  on ace inhibitor and lasix  given recent hypotension.  Keep K= 4, Mg = 2 at least.  Transfuse for goal Hgb >8    AKI resolved  Replete electrolytes as needed Goal K >4 and Mg >2 Keep euvolemic   ETOH abuse.  Continue monitoring for signs of withdrawal.  Continue thiamine and folic acid  Anemia and thrombocytopenia Likely secondary to alcohol use   DVT prophylaxsis SCDs  Code Status:  Full code    Family Communication: Discussed in detail with the patient, all imaging results, lab results explained to the patient   Disposition Plan:  Continue to monitor, likely discharge 4/11      Consultants:  Critical care  Procedures: /3 - admitted to [facility] for fatigue; worsening anemia, +FOBT, 2U PRBCs 4/4 - EGD shows large, non bleeding gastric ulcer; injected with epi 4/6 - hematemesis, melena, BRPBR, syncope - EGD shows gastric ulcer with visible vessel; 2U PRBCs - transfer to [facility] 4/7 - EGD - cautery of ulcer, clip placement for localization of vessel if IR indicated     Antibiotics: Anti-infectives    Start     Dose/Rate Route Frequency Ordered Stop   12/21/16 0330  cefTRIAXone (ROCEPHIN) 1 g in dextrose 5 % 50 mL IVPB     1 g 100 mL/hr over 30 Minutes Intravenous Every 24 hours 12/21/16 0329  HPI/Subjective: Patient denies further stools or vomiting at all since admission  Objective: Vitals:   12/24/16 2200 12/25/16 0254 12/25/16 0536 12/25/16 0600  BP:   (!) 115/55   Pulse: 81  (!) 43 76  Resp:   18   Temp:   98 F (36.7 C)   TempSrc:   Oral   SpO2:   100%   Weight:  56.3 kg (124 lb 3.2 oz)    Height:        Intake/Output Summary (Last 24 hours) at 12/25/16 1024 Last data filed at 12/25/16 0400  Gross per 24 hour  Intake          1426.75 ml  Output              450 ml  Net           976.75 ml    Exam:  Examination:  General exam: Appears calm and comfortable  Respiratory system: Clear to auscultation. Respiratory effort  normal. Cardiovascular system: S1 & S2 heard, RRR. No JVD, murmurs, rubs, gallops or clicks. No pedal edema. Gastrointestinal system: Abdomen is nondistended, soft and nontender. No organomegaly or masses felt. Normal bowel sounds heard. Central nervous system: Alert and oriented. No focal neurological deficits. Extremities: Symmetric 5 x 5 power. Skin: No rashes, lesions or ulcers Psychiatry: Judgement and insight appear normal. Mood & affect appropriate.     Data Reviewed: I have personally reviewed following labs and imaging studies  Micro Results Recent Results (from the past 240 hour(s))  Urine culture     Status: None   Collection Time: 12/18/16  6:27 PM  Result Value Ref Range Status   Specimen Description URINE, CLEAN CATCH  Final   Special Requests NONE  Final   Culture   Final    NO GROWTH Performed at [facility], [address], [place], [place] [address]    Report Status 12/20/2016 FINAL  Final  MRSA PCR Screening     Status: None   Collection Time: 12/21/16  1:53 AM  Result Value Ref Range Status   MRSA by PCR NEGATIVE NEGATIVE Final    Comment:        The GeneXpert MRSA Assay (FDA approved for NASAL specimens only), is one component of a comprehensive MRSA colonization surveillance program. It is not intended to diagnose MRSA infection nor to guide or monitor treatment for MRSA infections.     Radiology Reports Ct Head Wo Contrast  Result Date: 12/18/2016 CLINICAL DATA:  Weakness.  No reported injury. EXAM: CT HEAD WITHOUT CONTRAST TECHNIQUE: Contiguous axial images were obtained from the base of the skull through the vertex without intravenous contrast. COMPARISON:  None. FINDINGS: Brain: No evidence of parenchymal hemorrhage or extra-axial fluid collection. No mass lesion, mass effect, or midline shift. No CT evidence of acute infarction. Nonspecific mild subcortical and periventricular white matter hypodensity, most in keeping with chronic small  vessel ischemic change. Cerebral volume is age appropriate. No ventriculomegaly. Vascular: Intracranial atherosclerosis.  No acute abnormality Skull: No evidence of calvarial fracture. Sinuses/Orbits: The visualized paranasal sinuses are essentially clear. Other:  The mastoid air cells are unopacified. IMPRESSION: 1.  No evidence of acute intracranial abnormality. 2. Mild chronic small vessel ischemia. Electronically Signed   By: Ilona Sorrel M.D.   On: 12/18/2016 18:31   US Renal  Result Date: 12/14/2016 CLINICAL DATA:  Acute tubular necrosis, proteinuria a EXAM: RENAL / URINARY TRACT ULTRASOUND COMPLETE COMPARISON:  None. FINDINGS: Right Kidney: Length:  5.8 cm.  Right kidney is atrophic without hydronephrosis. Left Kidney: Length: 10.9 cm. Echogenicity within normal limits. No mass or hydronephrosis visualized. Bladder: Appears normal for degree of bladder distention. Only left ureteral jet is visualized. IMPRESSION: 1. Atrophic right kidney without hydronephrosis measures 5.8 cm in length. 2. Normal size left kidney. No left hydronephrosis. Only left ureteral jet is visualized. The urinary bladder is unremarkable. Electronically Signed   By: Lahoma Crocker M.D.   On: 12/14/2016 10:45   Dg Chest Port 1 View  Result Date: 12/18/2016 CLINICAL DATA:  Shortness of breath and cough EXAM: PORTABLE CHEST 1 VIEW COMPARISON:  12/13/2016 FINDINGS: No acute infiltrate or effusion. Stable cardiomediastinal silhouette with atherosclerosis. No pneumothorax. IMPRESSION: No active disease. Electronically Signed   By: Donavan Foil M.D.   On: 12/18/2016 19:06   Dg Chest Port 1 View  Result Date: 12/13/2016 CLINICAL DATA:  Acute onset of unstable gait.  Initial encounter. EXAM: PORTABLE CHEST 1 VIEW COMPARISON:  Chest radiograph performed 04/06/2013 FINDINGS: The lungs are well-aerated and clear. There is no evidence of focal opacification, pleural effusion or pneumothorax. The cardiomediastinal silhouette is within normal  limits. No acute osseous abnormalities are seen. IMPRESSION: No acute cardiopulmonary process seen. Electronically Signed   By: Garald Balding M.D.   On: 12/13/2016 19:39     CBC  Recent Labs Lab 12/18/16 1716  12/20/16 0830  12/21/16 1106  12/22/16 0604  12/23/16 0445  12/24/16 0143 12/24/16 0811 12/24/16 1402 12/24/16 2004 12/25/16 0216 12/25/16 0824  WBC 5.3  --  5.4  < > 9.4  --  6.6  --  7.6  --  7.3  --   --   --  6.7  --   HGB 8.1*  < > 10.1*  < > 8.6*  < > 8.1*  < > 7.4*  < > 9.0* 9.1* 9.3* 9.0* 9.0* 9.0*  HCT 23.2*  < > 29.2*  < > 25.6*  < > 23.4*  < > 21.3*  < > 26.1* 26.3* 27.0* 26.1* 26.0* 26.3*  PLT 104*  --  100*  < > 62*  --  58*  --  74*  --  85*  --   --   --  101*  --   MCV 88.2  --  86.6  < > 84.5  --  84.2  --  84.9  --  84.7  --   --   --  85.2  --   MCH 30.8  --  30.0  < > 28.4  --  29.1  --  29.5  --  29.2  --   --   --  29.5  --   MCHC 34.9  --  34.6  < > 33.6  --  34.6  --  34.7  --  34.5  --   --   --  34.6  --   RDW 13.2  --  14.1  < > 15.5  --  14.9  --  15.0  --  14.9  --   --   --  15.0  --   LYMPHSABS 1.8  --  2.3  --   --   --   --   --   --   --   --   --   --   --   --   --   MONOABS 0.4  --  0.2  --   --   --   --   --   --   --   --   --   --   --   --   --  EOSABS 0.1  --  0.4  --   --   --   --   --   --   --   --   --   --   --   --   --   BASOSABS 0.0  --  0.0  --   --   --   --   --   --   --   --   --   --   --   --   --   < > = values in this interval not displayed.  Chemistries   Recent Labs Lab 12/18/16 1721  12/21/16 0045 12/21/16 2236 12/22/16 0604 12/23/16 0445 12/24/16 0143 12/25/16 0216  NA 137  < > 135 138 138 135 134* 136  K 5.2*  < > 3.9 4.9 4.1 3.6 3.6 4.0  CL 108  < > 106 112* 111 106 104 106  CO2 25  < > 22 20* 19* '22 23 22  '$ GLUCOSE 98  < > 160* 100* 84 97 99 84  BUN 58*  < > 34* 34* 26* '13 8 9  '$ CREATININE 1.42*  < > 1.34* 1.17 1.13 1.17 1.13 1.27*  CALCIUM 8.0*  < > 7.6* 7.1* 7.4* 7.3* 7.5* 7.6*  MG  --   < >  1.6* 2.1 1.9 1.8 1.8 1.8  AST 21  --  25  --   --   --   --   --   ALT 19  --  19  --   --   --   --   --   ALKPHOS 31*  --  25*  --   --   --   --   --   BILITOT 0.3  --  0.4  --   --   --   --   --   < > = values in this interval not displayed. ------------------------------------------------------------------------------------------------------------------ estimated creatinine clearance is 41.9 mL/min (A) (by C-G formula based on SCr of 1.27 mg/dL (H)). ------------------------------------------------------------------------------------------------------------------ No results for input(s): HGBA1C in the last 72 hours. ------------------------------------------------------------------------------------------------------------------ No results for input(s): CHOL, HDL, LDLCALC, TRIG, CHOLHDL, LDLDIRECT in the last 72 hours. ------------------------------------------------------------------------------------------------------------------ No results for input(s): TSH, T4TOTAL, T3FREE, THYROIDAB in the last 72 hours.  Invalid input(s): FREET3 ------------------------------------------------------------------------------------------------------------------ No results for input(s): VITAMINB12, FOLATE, FERRITIN, TIBC, IRON, RETICCTPCT in the last 72 hours.  Coagulation profile  Recent Labs Lab 12/19/16 0518 12/21/16 0045  INR 1.08 1.14    No results for input(s): DDIMER in the last 72 hours.  Cardiac Enzymes  Recent Labs Lab 12/18/16 1721 12/21/16 0045  TROPONINI 0.05* 0.04*   ------------------------------------------------------------------------------------------------------------------ Invalid input(s): POCBNP   CBG:  Recent Labs Lab 12/24/16 1215 12/24/16 1738 12/24/16 2007 12/25/16 0030 12/25/16 0410  GLUCAP 86 110* 113* 89 86       Studies: No results found.    Lab Results  Component Value Date   HGBA1C 5.9 (H) 12/13/2016   Lab Results  Component  Value Date   LDLCALC 52 12/02/2010   CREATININE 1.27 (H) 12/25/2016       Scheduled Meds: . carvedilol  12.5 mg Oral BID WC  . cefTRIAXone (ROCEPHIN)  IV  1 g Intravenous Q24H  . folic acid  1 mg Intravenous Daily  . mouth rinse  15 mL Mouth Rinse BID  . pantoprazole (PROTONIX) IV  40 mg Intravenous Q12H  . sodium chloride  1,000 mL Intravenous Once  . sucralfate  1 g  Oral TID WC & HS  . thiamine injection  100 mg Intravenous Daily   Continuous Infusions: . sodium chloride 10 mL/hr at 12/24/16 0225     LOS: 7 days    Time spent: >30 MINS    Reyne Dumas  [facility] Pager [phone]. If 7PM-7AM, please contact night-coverage at [email or web address], password [facility] 12/25/2016, 10:24 AM  LOS: 7 days

## 2016-12-25 NOTE — Progress Notes (Signed)
qPhysical Therapy Treatment Patient Details Name: [name] MRN: [id number] DOB: 04-06-1946 Today's Date: 12/25/2016    History of Present Illness Patient is a 71 yo male admitted 12/18/16 with weakness, decreased appetite.  Patient with GIB. Patient transferred to [facility] on 12/21/16.  Patient with bloody stool and bloody emesis.  Patient with hypotension.  To ICU.     PMH:   CAD/ RCA stent in 2007, ICM EF 20-25%, HTN, CKD w nonfunct L kidney, "cirrhosis", hx prior etoh abuse and HL    PT Comments    Pt presents with extreme pain in R foot tender to palpation on medial dorsal and plantar aspects.  Pt requiring increased assist due to pain and will likely need rehab if his mobility does not improve.   Will inform supervising PT of decline in function at this time.  Will wait for results of testing to R foot before next tx.  Supervising PT to see patient next session for d/c planning.     Follow Up Recommendations  No PT follow up;Supervision for mobility/OOB (may require HHPT vs SNF pending progression due to new development of R foot pain.  )     Equipment Recommendations  None recommended by PT    Recommendations for Other Services       Precautions / Restrictions Precautions Precautions: Fall Restrictions Weight Bearing Restrictions: No    Mobility  Bed Mobility Overal bed mobility: Modified Independent             General bed mobility comments: Increased time.  Transfers Overall transfer level: Needs assistance Equipment used: Rolling walker (2 wheeled) Transfers: Sit to/from Stand Sit to Stand: Min assist         General transfer comment: Pt with increased pain in R foot and unable to bear weight,  Instep and medial aspects of the plantar and dorsal foot are painful to palpation.  Pt unable to progress from standing to gait.    Ambulation/Gait Ambulation/Gait assistance:  (Pt refused ambulation secondary to pain.  )               Stairs             Wheelchair Mobility    Modified Rankin (Stroke Patients Only)       Balance     Sitting balance-Leahy Scale: Good Sitting balance - Comments: guarded due to pain.       Standing balance-Leahy Scale: Poor Standing balance comment: Pt presents with poor balance due to innability to weight bear on the R LE.                              Cognition Arousal/Alertness: Awake/alert Behavior During Therapy: WFL for tasks assessed/performed Overall Cognitive Status: Within Functional Limits for tasks assessed                                        Exercises      General Comments        Pertinent Vitals/Pain 10/10 in L medial dorsal and plantar aspects of foot.     Home Living                      Prior Function            PT Goals (current goals can now be found in  the care plan section) Acute Rehab PT Goals Patient Stated Goal: For his foot to stop hurting Potential to Achieve Goals: Fair Progress towards PT goals: Not progressing toward goals - comment (due to pain in R foot)    Frequency    Min 3X/week      PT Plan Current plan remains appropriate    Co-evaluation             End of Session Equipment Utilized During Treatment: Gait belt Activity Tolerance: Patient tolerated treatment well Patient left: with call bell/phone within reach;in bed Nurse Communication: Mobility status PT Visit Diagnosis: History of falling (Z91.81);Muscle weakness (generalized) (M62.81);Other abnormalities of gait and mobility (R26.89)     Time: 1346-1401 PT Time Calculation (min) (ACUTE ONLY): 15 min  Charges:  $Therapeutic Activity: 8-22 mins                    G CodesGovernor [name], PTA pager [phone]    Cristela Blue 12/25/2016, 2:11 PM

## 2016-12-26 DIAGNOSIS — I950 Idiopathic hypotension: Secondary | ICD-10-CM

## 2016-12-26 LAB — CBC
HCT: 27 % — ABNORMAL LOW (ref 39.0–52.0)
HEMOGLOBIN: 9.2 g/dL — AB (ref 13.0–17.0)
MCH: 29.1 pg (ref 26.0–34.0)
MCHC: 34.1 g/dL (ref 30.0–36.0)
MCV: 85.4 fL (ref 78.0–100.0)
PLATELETS: 125 10*3/uL — AB (ref 150–400)
RBC: 3.16 MIL/uL — AB (ref 4.22–5.81)
RDW: 14.9 % (ref 11.5–15.5)
WBC: 5.6 10*3/uL (ref 4.0–10.5)

## 2016-12-26 LAB — HEMOGLOBIN AND HEMATOCRIT, BLOOD
HCT: 26.5 % — ABNORMAL LOW (ref 39.0–52.0)
HEMATOCRIT: 26.9 % — AB (ref 39.0–52.0)
HEMOGLOBIN: 9 g/dL — AB (ref 13.0–17.0)
HEMOGLOBIN: 9.1 g/dL — AB (ref 13.0–17.0)

## 2016-12-26 LAB — GLUCOSE, CAPILLARY
GLUCOSE-CAPILLARY: 120 mg/dL — AB (ref 65–99)
Glucose-Capillary: 93 mg/dL (ref 65–99)
Glucose-Capillary: 112 mg/dL — ABNORMAL HIGH (ref 65–99)

## 2016-12-26 LAB — COMPREHENSIVE METABOLIC PANEL
ALBUMIN: 2 g/dL — AB (ref 3.5–5.0)
ALT: 17 U/L (ref 17–63)
ANION GAP: 7 (ref 5–15)
AST: 20 U/L (ref 15–41)
Alkaline Phosphatase: 35 U/L — ABNORMAL LOW (ref 38–126)
BILIRUBIN TOTAL: 0.2 mg/dL — AB (ref 0.3–1.2)
BUN: 8 mg/dL (ref 6–20)
CHLORIDE: 107 mmol/L (ref 101–111)
CO2: 24 mmol/L (ref 22–32)
Calcium: 7.7 mg/dL — ABNORMAL LOW (ref 8.9–10.3)
Creatinine, Ser: 1.2 mg/dL (ref 0.61–1.24)
GFR calc Af Amer: >60 mL/min (ref >60)
GFR, EST NON AFRICAN AMERICAN: 59 mL/min — AB (ref >60)
Glucose, Bld: 118 mg/dL — ABNORMAL HIGH (ref 65–99)
POTASSIUM: 3.4 mmol/L — AB (ref 3.5–5.1)
Sodium: 138 mmol/L (ref 135–145)
TOTAL PROTEIN: 5 g/dL — AB (ref 6.5–8.1)

## 2016-12-26 LAB — PHOSPHORUS: PHOSPHORUS: 2.1 mg/dL — AB (ref 2.5–4.6)

## 2016-12-26 LAB — MAGNESIUM: MAGNESIUM: 1.8 mg/dL (ref 1.7–2.4)

## 2016-12-26 LAB — URIC ACID: Uric Acid, Serum: 5.4 mg/dL (ref 4.4–7.6)

## 2016-12-26 MED ORDER — PANTOPRAZOLE SODIUM 40 MG PO TBEC: 40.0000 mg | DELAYED_RELEASE_TABLET | Freq: Two times a day (BID) | ORAL | 1 refills | Status: DC

## 2016-12-26 MED ORDER — BISACODYL 10 MG RE SUPP: 10.0000 mg | Freq: Every day | RECTAL | 0 refills | Status: DC | PRN

## 2016-12-26 MED ORDER — MAGNESIUM OXIDE 400 (241.3 MG) MG PO TABS: 400.0000 mg | ORAL_TABLET | Freq: Every day | ORAL | 0 refills | Status: DC

## 2016-12-26 MED ORDER — SUCRALFATE 1 GM/10ML PO SUSP: 1.0000 g | Freq: Three times a day (TID) | ORAL | 0 refills | Status: DC

## 2016-12-26 MED ORDER — COLCHICINE 0.6 MG PO TABS: 0.6000 mg | ORAL_TABLET | Freq: Two times a day (BID) | ORAL | 0 refills | Status: DC

## 2016-12-26 MED ORDER — THIAMINE HCL 100 MG PO TABS: 100.0000 mg | ORAL_TABLET | Freq: Every day | ORAL | 2 refills | Status: DC

## 2016-12-26 MED ORDER — POTASSIUM CHLORIDE CRYS ER 20 MEQ PO TBCR
40.0000 meq | EXTENDED_RELEASE_TABLET | Freq: Once | ORAL | Status: AC
  Administered 2016-12-26: 40 meq via ORAL
  Filled 2016-12-26: qty 2

## 2016-12-26 NOTE — Discharge Instructions (Signed)
Follow-up recommendations Follow-up with PCP in 3-5 days , including all  additional recommended appointments as below Follow-up CBC, CMP in 3-5 days Avoid nsaids He'll need follow-up with outpatient GI in [place], surveillance EGD in 3 months to document healing of the ulcer

## 2016-12-26 NOTE — Care Management Note (Addendum)
Case Management Note  Patient Details  Name: [name] MRN: [id number] Date of Birth: 09-30-1945  Subjective/Objective:                 Admitted 12/18/16 with weakness, decreased appetite.  Patient with GIB. Lives with daughter and sister.   Action/Plan: Per PT recommendations: SNF. CSW referral made per CM. Plan is to d/c to SNF today, however, pt refused SNF placement. Home health services in place. THN assessed.  Expected Discharge Date:    12/27/2015            Expected Discharge Plan:  Home/Self Care  In-House Referral:  Clinical Social Work  Discharge planning Services  CM Consult  Post Acute Care Choice:  NA Choice offered to:  NA  DME Arranged:    DME Agency:     HH Arranged:   PT.OT [place] referral made to Butch Penny @ [phone]  Status of Service:  Completed, signed off  If discussed at [place] of Stay Meetings, dates discussed:    Additional Comments:  Sharin Mons, RN 12/26/2016, 11:25 AM

## 2016-12-26 NOTE — Discharge Summary (Signed)
Physician Discharge Summary  [name] MRN: [id number] DOB/AGE: [birth year] 72 y.o.  PCP: Alonza Bogus, MD   Admit date: 12/18/2016 Discharge date: 12/26/2016  Discharge Diagnoses:    Principal Problem:   Hypotension Active Problems:   Coronary artery disease   Chronic kidney disease   Tobacco abuse   Malnutrition of moderate degree   Volume depletion   Ischemic cardiomyopathy   Anemia   Heme + stool   Gastric ulcer   Acute blood loss anemia   Hemorrhagic shock and encephalopathy syndrome (HCC)   Acute upper GI bleed   Hematochezia   Chronic systolic congestive heart failure ([place])    Follow-up recommendations Follow-up with PCP in 3-5 days , including all  additional recommended appointments as below Follow-up CBC, CMP in 3-5 days Avoid nsaids He'll need follow-up with outpatient GI in [place], surveillance EGD in 3 months to document healing of the ulcer      Current Discharge Medication List    START taking these medications   Details  bisacodyl (DULCOLAX) 10 MG suppository Place 1 suppository (10 mg total) rectally daily as needed for moderate constipation. Qty: 12 suppository, Refills: 0    colchicine 0.6 MG tablet Take 1 tablet (0.6 mg total) by mouth 2 (two) times daily. Qty: 14 tablet, Refills: 0    magnesium oxide (MAG-OX) 400 (241.3 Mg) MG tablet Take 1 tablet (400 mg total) by mouth daily. Qty: 30 tablet, Refills: 0    sucralfate (CARAFATE) 1 GM/10ML suspension Take 10 mLs (1 g total) by mouth 4 (four) times daily -  with meals and at bedtime. Qty: 420 mL, Refills: 0    thiamine 100 MG tablet Take 1 tablet (100 mg total) by mouth daily. Qty: 30 tablet, Refills: 2      CONTINUE these medications which have CHANGED   Details  pantoprazole (PROTONIX) 40 MG tablet Take 1 tablet (40 mg total) by mouth 2 (two) times daily. Qty: 60 tablet, Refills: 1      CONTINUE these medications which have NOT CHANGED   Details  carvedilol (COREG)  12.5 MG tablet TAKE 1 TABLET BY MOUTH TWICE DAILY. Qty: 60 tablet, Refills: 0    docusate sodium (COLACE) 100 MG capsule Take 1 capsule (100 mg total) by mouth 2 (two) times daily. Qty: 10 capsule, Refills: 0    feeding supplement, ENSURE ENLIVE, (ENSURE ENLIVE) LIQD Take 237 mLs by mouth 2 (two) times daily between meals. Qty: 237 mL, Refills: 12    folic acid (FOLVITE) 1 MG tablet Take 1 tablet (1 mg total) by mouth daily. Qty: 30 tablet, Refills: 3    Multiple Vitamin (DAILY VITE) TABS TAKE ONE TABLET BY MOUTH ONCE DAILY. Qty: 90 tablet, Refills: 1    nitroGLYCERIN (NITROSTAT) 0.4 MG SL tablet Place 0.4 mg under the tongue as needed.      castor oil liquid Take 10 mLs by mouth daily as needed for moderate constipation.      STOP taking these medications     ASPIRIN LOW DOSE 81 MG EC tablet          Discharge Condition: Stable   Discharge Instructions Get Medicines reviewed and adjusted: Please take all your medications with you for your next visit with your Primary MD  Please request your Primary MD to go over all hospital tests and procedure/radiological results at the follow up, please ask your Primary MD to get all Hospital records sent to his/her office.  If you experience worsening of your  admission symptoms, develop shortness of breath, life threatening emergency, suicidal or homicidal thoughts you must seek medical attention immediately by calling 911 or calling your MD immediately if symptoms less severe.  You must read complete instructions/literature along with all the possible adverse reactions/side effects for all the Medicines you take and that have been prescribed to you. Take any new Medicines after you have completely understood and accpet all the possible adverse reactions/side effects.   Do not drive when taking Pain medications.   Do not take more than prescribed Pain, Sleep and Anxiety Medications  Special Instructions: If you have smoked or chewed  Tobacco in the last 2 yrs please stop smoking, stop any regular Alcohol and or any Recreational drug use.  Wear Seat belts while driving.  Please note  You were cared for by a hospitalist during your hospital stay. Once you are discharged, your primary care physician will handle any further medical issues. Please note that NO REFILLS for any discharge medications will be authorized once you are discharged, as it is imperative that you return to your primary care physician (or establish a relationship with a primary care physician if you do not have one) for your aftercare needs so that they can reassess your need for medications and monitor your lab values.     Consults:  Critical care GI     Significant Diagnostic Studies:  Ct Head Wo Contrast  Result Date: 12/18/2016 CLINICAL DATA:  Weakness.  No reported injury. EXAM: CT HEAD WITHOUT CONTRAST TECHNIQUE: Contiguous axial images were obtained from the base of the skull through the vertex without intravenous contrast. COMPARISON:  None. FINDINGS: Brain: No evidence of parenchymal hemorrhage or extra-axial fluid collection. No mass lesion, mass effect, or midline shift. No CT evidence of acute infarction. Nonspecific mild subcortical and periventricular white matter hypodensity, most in keeping with chronic small vessel ischemic change. Cerebral volume is age appropriate. No ventriculomegaly. Vascular: Intracranial atherosclerosis.  No acute abnormality Skull: No evidence of calvarial fracture. Sinuses/Orbits: The visualized paranasal sinuses are essentially clear. Other:  The mastoid air cells are unopacified. IMPRESSION: 1.  No evidence of acute intracranial abnormality. 2. Mild chronic small vessel ischemia. Electronically Signed   By: Ilona Sorrel M.D.   On: 12/18/2016 18:31   US Renal  Result Date: 12/14/2016 CLINICAL DATA:  Acute tubular necrosis, proteinuria a EXAM: RENAL / URINARY TRACT ULTRASOUND COMPLETE COMPARISON:  None.  FINDINGS: Right Kidney: Length: 5.8 cm.  Right kidney is atrophic without hydronephrosis. Left Kidney: Length: 10.9 cm. Echogenicity within normal limits. No mass or hydronephrosis visualized. Bladder: Appears normal for degree of bladder distention. Only left ureteral jet is visualized. IMPRESSION: 1. Atrophic right kidney without hydronephrosis measures 5.8 cm in length. 2. Normal size left kidney. No left hydronephrosis. Only left ureteral jet is visualized. The urinary bladder is unremarkable. Electronically Signed   By: Lahoma Crocker M.D.   On: 12/14/2016 10:45   Dg Chest Port 1 View  Result Date: 12/18/2016 CLINICAL DATA:  Shortness of breath and cough EXAM: PORTABLE CHEST 1 VIEW COMPARISON:  12/13/2016 FINDINGS: No acute infiltrate or effusion. Stable cardiomediastinal silhouette with atherosclerosis. No pneumothorax. IMPRESSION: No active disease. Electronically Signed   By: Donavan Foil M.D.   On: 12/18/2016 19:06   Dg Chest Port 1 View  Result Date: 12/13/2016 CLINICAL DATA:  Acute onset of unstable gait.  Initial encounter. EXAM: PORTABLE CHEST 1 VIEW COMPARISON:  Chest radiograph performed 04/06/2013 FINDINGS: The lungs are well-aerated and clear. There  is no evidence of focal opacification, pleural effusion or pneumothorax. The cardiomediastinal silhouette is within normal limits. No acute osseous abnormalities are seen. IMPRESSION: No acute cardiopulmonary process seen. Electronically Signed   By: Garald Balding M.D.   On: 12/13/2016 19:39        Filed Weights   12/24/16 0548 12/25/16 0254 12/26/16 0439  Weight: 56.7 kg (125 lb) 56.3 kg (124 lb 3.2 oz) 58 kg (127 lb 14.4 oz)     Microbiology: Recent Results (from the past 240 hour(s))  Urine culture     Status: None   Collection Time: 12/18/16  6:27 PM  Result Value Ref Range Status   Specimen Description URINE, CLEAN CATCH  Final   Special Requests NONE  Final   Culture   Final    NO GROWTH Performed at [facility], [place] [address] [place], [place] [address]    Report Status 12/20/2016 FINAL  Final  MRSA PCR Screening     Status: None   Collection Time: 12/21/16  1:53 AM  Result Value Ref Range Status   MRSA by PCR NEGATIVE NEGATIVE Final    Comment:        The GeneXpert MRSA Assay (FDA approved for NASAL specimens only), is one component of a comprehensive MRSA colonization surveillance program. It is not intended to diagnose MRSA infection nor to guide or monitor treatment for MRSA infections.        Blood Culture    Component Value Date/Time   SDES URINE, CLEAN CATCH 12/18/2016 1827   SPECREQUEST NONE 12/18/2016 1827   CULT  12/18/2016 1827    NO GROWTH Performed at [facility], [place] [address], [place], [place] [address]    REPTSTATUS 12/20/2016 FINAL 12/18/2016 1827      Labs: Results for orders placed or performed during the hospital encounter of 12/18/16 (from the past 48 hour(s))  Glucose, capillary     Status: None   Collection Time: 12/24/16 12:15 PM  Result Value Ref Range   Glucose-Capillary 86 65 - 99 mg/dL  Hemoglobin and hematocrit, blood     Status: Abnormal   Collection Time: 12/24/16  2:02 PM  Result Value Ref Range   Hemoglobin 9.3 (L) 13.0 - 17.0 g/dL   HCT 27.0 (L) 39.0 - 52.0 %  Glucose, capillary     Status: Abnormal   Collection Time: 12/24/16  5:38 PM  Result Value Ref Range   Glucose-Capillary 110 (H) 65 - 99 mg/dL  Hemoglobin and hematocrit, blood     Status: Abnormal   Collection Time: 12/24/16  8:04 PM  Result Value Ref Range   Hemoglobin 9.0 (L) 13.0 - 17.0 g/dL   HCT 26.1 (L) 39.0 - 52.0 %  Glucose, capillary     Status: Abnormal   Collection Time: 12/24/16  8:07 PM  Result Value Ref Range   Glucose-Capillary 113 (H) 65 - 99 mg/dL   Comment 1 Notify RN    Comment 2 Document in Chart   Glucose, capillary     Status: None   Collection Time: 12/25/16 12:30 AM  Result Value Ref Range   Glucose-Capillary 89 65 - 99 mg/dL   Comment  1 Notify RN    Comment 2 Document in Chart   Basic metabolic panel     Status: Abnormal   Collection Time: 12/25/16  2:16 AM  Result Value Ref Range   Sodium 136 135 - 145 mmol/L   Potassium 4.0 3.5 - 5.1  mmol/L   Chloride 106 101 - 111 mmol/L   CO2 22 22 - 32 mmol/L   Glucose, Bld 84 65 - 99 mg/dL   BUN 9 6 - 20 mg/dL   Creatinine, Ser 1.27 (H) 0.61 - 1.24 mg/dL   Calcium 7.6 (L) 8.9 - 10.3 mg/dL   GFR calc non Af Amer 55 (L) >60 mL/min   GFR calc Af Amer >60 >60 mL/min    Comment: (NOTE) The eGFR has been calculated using the CKD EPI equation. This calculation has not been validated in all clinical situations. eGFR's persistently <60 mL/min signify possible Chronic Kidney Disease.    Anion gap 8 5 - 15  Magnesium     Status: None   Collection Time: 12/25/16  2:16 AM  Result Value Ref Range   Magnesium 1.8 1.7 - 2.4 mg/dL  Phosphorus     Status: None   Collection Time: 12/25/16  2:16 AM  Result Value Ref Range   Phosphorus 2.5 2.5 - 4.6 mg/dL  CBC     Status: Abnormal   Collection Time: 12/25/16  2:16 AM  Result Value Ref Range   WBC 6.7 4.0 - 10.5 K/uL   RBC 3.05 (L) 4.22 - 5.81 MIL/uL   Hemoglobin 9.0 (L) 13.0 - 17.0 g/dL   HCT 26.0 (L) 39.0 - 52.0 %   MCV 85.2 78.0 - 100.0 fL   MCH 29.5 26.0 - 34.0 pg   MCHC 34.6 30.0 - 36.0 g/dL   RDW 15.0 11.5 - 15.5 %   Platelets 101 (L) 150 - 400 K/uL    Comment: CONSISTENT WITH PREVIOUS RESULT  Glucose, capillary     Status: None   Collection Time: 12/25/16  4:10 AM  Result Value Ref Range   Glucose-Capillary 86 65 - 99 mg/dL   Comment 1 Notify RN    Comment 2 Document in Chart   Glucose, capillary     Status: None   Collection Time: 12/25/16  8:03 AM  Result Value Ref Range   Glucose-Capillary 74 65 - 99 mg/dL  Hemoglobin and hematocrit, blood     Status: Abnormal   Collection Time: 12/25/16  8:24 AM  Result Value Ref Range   Hemoglobin 9.0 (L) 13.0 - 17.0 g/dL   HCT 26.3 (L) 39.0 - 52.0 %  Glucose, capillary      Status: Abnormal   Collection Time: 12/25/16 11:53 AM  Result Value Ref Range   Glucose-Capillary 112 (H) 65 - 99 mg/dL  Hemoglobin and hematocrit, blood     Status: Abnormal   Collection Time: 12/25/16  1:23 PM  Result Value Ref Range   Hemoglobin 9.6 (L) 13.0 - 17.0 g/dL   HCT 28.2 (L) 39.0 - 52.0 %  Glucose, capillary     Status: Abnormal   Collection Time: 12/25/16  5:28 PM  Result Value Ref Range   Glucose-Capillary 141 (H) 65 - 99 mg/dL  Hemoglobin and hematocrit, blood     Status: Abnormal   Collection Time: 12/25/16  8:04 PM  Result Value Ref Range   Hemoglobin 9.0 (L) 13.0 - 17.0 g/dL   HCT 26.5 (L) 39.0 - 52.0 %  Glucose, capillary     Status: Abnormal   Collection Time: 12/25/16  8:17 PM  Result Value Ref Range   Glucose-Capillary 124 (H) 65 - 99 mg/dL   Comment 1 Notify RN    Comment 2 Document in Chart   Glucose, capillary     Status: Abnormal   Collection  Time: 12/25/16 11:53 PM  Result Value Ref Range   Glucose-Capillary 134 (H) 65 - 99 mg/dL   Comment 1 Notify RN    Comment 2 Document in Chart   CBC     Status: Abnormal   Collection Time: 12/26/16  2:15 AM  Result Value Ref Range   WBC 5.6 4.0 - 10.5 K/uL   RBC 3.16 (L) 4.22 - 5.81 MIL/uL   Hemoglobin 9.2 (L) 13.0 - 17.0 g/dL   HCT 27.0 (L) 39.0 - 52.0 %   MCV 85.4 78.0 - 100.0 fL   MCH 29.1 26.0 - 34.0 pg   MCHC 34.1 30.0 - 36.0 g/dL   RDW 14.9 11.5 - 15.5 %   Platelets 125 (L) 150 - 400 K/uL  Phosphorus     Status: Abnormal   Collection Time: 12/26/16  2:15 AM  Result Value Ref Range   Phosphorus 2.1 (L) 2.5 - 4.6 mg/dL  Magnesium     Status: None   Collection Time: 12/26/16  2:15 AM  Result Value Ref Range   Magnesium 1.8 1.7 - 2.4 mg/dL  Comprehensive metabolic panel     Status: Abnormal   Collection Time: 12/26/16  2:15 AM  Result Value Ref Range   Sodium 138 135 - 145 mmol/L   Potassium 3.4 (L) 3.5 - 5.1 mmol/L   Chloride 107 101 - 111 mmol/L   CO2 24 22 - 32 mmol/L   Glucose, Bld 118 (H)  65 - 99 mg/dL   BUN 8 6 - 20 mg/dL   Creatinine, Ser 1.20 0.61 - 1.24 mg/dL   Calcium 7.7 (L) 8.9 - 10.3 mg/dL   Total Protein 5.0 (L) 6.5 - 8.1 g/dL   Albumin 2.0 (L) 3.5 - 5.0 g/dL   AST 20 15 - 41 U/L   ALT 17 17 - 63 U/L   Alkaline Phosphatase 35 (L) 38 - 126 U/L   Total Bilirubin 0.2 (L) 0.3 - 1.2 mg/dL   GFR calc non Af Amer 59 (L) >60 mL/min   GFR calc Af Amer >60 >60 mL/min    Comment: (NOTE) The eGFR has been calculated using the CKD EPI equation. This calculation has not been validated in all clinical situations. eGFR's persistently <60 mL/min signify possible Chronic Kidney Disease.    Anion gap 7 5 - 15  Glucose, capillary     Status: Abnormal   Collection Time: 12/26/16  4:09 AM  Result Value Ref Range   Glucose-Capillary 120 (H) 65 - 99 mg/dL   Comment 1 Notify RN    Comment 2 Document in Chart   Glucose, capillary     Status: None   Collection Time: 12/26/16  7:40 AM  Result Value Ref Range   Glucose-Capillary 93 65 - 99 mg/dL  Hemoglobin and hematocrit, blood     Status: Abnormal   Collection Time: 12/26/16  8:10 AM  Result Value Ref Range   Hemoglobin 9.0 (L) 13.0 - 17.0 g/dL   HCT 26.5 (L) 39.0 - 52.0 %     Lipid Panel     Component Value Date/Time   CHOL 102 12/02/2010 1953   TRIG 80 12/02/2010 1953   HDL 34 (L) 12/02/2010 1953   CHOLHDL 3.0 Ratio 12/02/2010 1953   VLDL 16 12/02/2010 1953   LDLCALC 52 12/02/2010 1953     Lab Results  Component Value Date   HGBA1C 5.9 (H) 12/13/2016       HPI :   Alexander Echeverri Blackwellis a 71  y.o.malewith history of CAD/ RCA stent in 2007, ICM EF 15-20%, HTN, CKD w nonfunct L kidney, "cirrhosis", hx prior etoh abuse and HL. He was admitted to [facility] on 4/3 with hypotension and weakness. He was found to have + FOBT and worsened anemia from previous (Hgb of 8.4 from 11 one week prior). He underwent EGD which showed a large gastric ulcer that was not bleeding (no esophageal varices, normal duodenum); the  ulcer was injected with epinephrin. Early 4/6 patient had hematemesis, melena and BRBPR with a syncopal episode; repeat EGD showed gastric No further bleeding since EGD. Patient received 2 U PRBCs on 4/4 and 2 units early this morning 4/6. Transferred to [facility] for further management.  TRH assumed to be primary on 4/10  HOSPITAL COURSE:    UGI bleeding from Gastric Ulcer now with exposed vessel per EGD 4/6.  s/p EGD with bipolar cautery and endoclip placement adjacent to ulcer 4/7. Likely NSAID induced  Protonix gtt was stopped on 4/8. Subsequently transitioned IV  Protonix  BID to oral Protonix twice a day Started on Sucralfate GI service has signed off. Needs outpt f/u with GI [place]. Patient will need surveillance EGD in 3 months Tolerated diet, no recurrent bleeding H&H 9.0 and 26.5 prior to discharge Rocephin for SBP prophylaxis > plan 5 days (until 4/10)-completed   Chronic CHF, EF 15-20% with CAD/hypokinesis Appear to be compensated during this admission Cont coreg Holding off on asa given recent bleed. Hold off on ace inhibitor and lasix given recent hypotension.  Keep K= 4, Mg = 2 at least.  Transfuse for goal Hgb >8   AKI resolved  Replete electrolytes as needed Goal K >4 and Mg >2 Keep euvolemic   ETOH abuse.  No signs of alcohol withdrawal Continue thiamine and folic acid  Anemia and thrombocytopenia Likely secondary to alcohol use Platelet count 125 on the day of discharge   Discharge Exam:   Blood pressure 127/68, pulse 93, temperature 98 F (36.7 C), temperature source Oral, resp. rate 18, height 5' 7" (1.702 m), weight 58 kg (127 lb 14.4 oz), SpO2 100 %.    General:   AA male in NAD Heart:  Regular rate and rhythm; no murmurs Lungs: Respirations even and unlabored, lungs CTA bilaterally Abdomen:  Soft, nontender and nondistended. Normal bowel sounds. Extremities:  Without edema. Neurologic:  Alert and orientedx 3  grossly normal  neurologically. Psych:  Cooperative. Normal mood and affect.  Follow-up Information    HAWKINS,EDWARD L, MD. Call.   Specialty:  Pulmonary Disease Why:  PCP to set up follow-up appointment, follow-up CBC, CMP, magnesium, uric acid Contact information: [place] [address] [phone]        Barney Drain, MD. Call.   Specialty:  Gastroenterology Why:  GI to get follow-up appointment, you would need a EGD in 3 months Contact information: [place] [address] [phone]           Signed: Reyne Dumas 12/26/2016, 9:54 AM        Time spent >45 mins

## 2016-12-26 NOTE — Progress Notes (Signed)
CSW received consult regarding PT recommendation of SNF at discharge.  Patient is refusing SNF and states he would rather be at home with his daughter and sister. RNCM alerted.  CSW signing off.   [name] Carlena Ruybal LCSWA [phone]

## 2016-12-26 NOTE — Consult Note (Signed)
   [facility] Inpatient Consult   12/26/2016  DAWSON ALBERS 04/21/1946 [id number]  Patient assessed for re-hospitalization but patient was transferred from [facility] to [facility]. 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  Chart review reveals the patient is a 71 year old man with ischemic cardiomyopathy, alcohol abuse, cirrhosis and CK D admitted for/3 with upper GI bleed due to large gastric ulcer with visible vessel, injected. He developed the bleed on 4/6 early a.m. and underwent repeat endoscopy and transferred to [facility] for interventional radiology consultation per MD notes in H&P.  Current plan is for the patient to go to a skilled facility per inpatient RNCM, Levada Dy.  Spoke with the patient and Education officer, museum and the patient is refusing to go to a skilled facility.  Explained [place] [facility] for post hospital needs.  Patient states he lives with his sister and daughter and he will have what he needs at home.  He denies any problems getting medicines or affording them. He states he has good transportation.  No post hospital needs noted at this time for community care management.  Patient's primary care provider is Dr. Sinda Du. Patient will discharge home with home health. A brochure with 24 hour nurse advise line given and encouraged to call. For questions, please contact:  Natividad Brood, RN BSN [facility] Liaison  [phone] business mobile phone Toll free office [phone]

## 2016-12-26 NOTE — Progress Notes (Signed)
qPhysical Therapy Treatment Patient Details Name: [name] MRN: [id number] DOB: [birth year] Today's Date: 12/26/2016    History of Present Illness Patient is a 71 yo male admitted 12/18/16 with weakness, decreased appetite.  Patient with GIB. Patient transferred to [facility] on 12/21/16.  Patient with bloody stool and bloody emesis.  Patient with hypotension.  To ICU.     PMH:   CAD/ RCA stent in 2007, ICM EF 20-25%, HTN, CKD w nonfunct L kidney, "cirrhosis", hx prior etoh abuse and HL    PT Comments    Pt continuing to experience 10/10 pain R foot. He is unable to tolerate WB or ambulation. D/C recommendation updated to SNF due to pt's decline.    Follow Up Recommendations  SNF     Equipment Recommendations  Rolling walker with 5" wheels    Recommendations for Other Services       Precautions / Restrictions Precautions Precautions: Fall    Mobility  Bed Mobility Overal bed mobility: Modified Independent             General bed mobility comments: +rail  Transfers   Equipment used: Rolling walker (2 wheeled) Transfers: Sit to/from [facility] Sit to Stand: Min guard Stand pivot transfers: Min guard       General transfer comment: Pt holding R foot up in NWB position.  Ambulation/Gait   Ambulation Distance (Feet): 3 Feet Assistive device: Rolling walker (2 wheeled) Gait Pattern/deviations: Step-to pattern;Trunk flexed     General Gait Details: Pt unable to tolerate WB R foot, holding the foot up in NWB position.  Pt took 'hop' steps forward, then back to the bed. He stated he was unable to tolerate walking due to pain.   Stairs            Wheelchair Mobility    Modified Rankin (Stroke Patients Only)       Balance   Sitting-balance support: Feet unsupported;No upper extremity supported Sitting balance-Leahy Scale: Good     Standing balance support: During functional activity;Bilateral upper extremity supported Standing  balance-Leahy Scale: Poor Standing balance comment: Pt presents with poor balance due to innability to weight bear on the R LE.                              Cognition Arousal/Alertness: Awake/alert Behavior During Therapy: WFL for tasks assessed/performed Overall Cognitive Status: Within Functional Limits for tasks assessed                                        Exercises      General Comments        Pertinent Vitals/Pain Pain Assessment: 0-10 Pain Score: 10-Worst pain ever Pain Location: R foot Pain Descriptors / Indicators: Throbbing;Sore Pain Intervention(s): Monitored during session;Limited activity within patient's tolerance    Home Living                      Prior Function            PT Goals (current goals can now be found in the care plan section) Acute Rehab PT Goals Patient Stated Goal: For his foot to stop hurting PT Goal Formulation: With patient Time For Goal Achievement: 12/30/16 Potential to Achieve Goals: Fair Progress towards PT goals: Not progressing toward goals - comment (pain R foot unable to  tolerate WB)    Frequency    Min 3X/week      PT Plan Discharge plan needs to be updated    Co-evaluation             End of Session Equipment Utilized During Treatment: Gait belt Activity Tolerance: Patient limited by pain Patient left: in bed;with call bell/phone within reach;with bed alarm set Nurse Communication: Mobility status PT Visit Diagnosis: History of falling (Z91.81);Muscle weakness (generalized) (M62.81);Other abnormalities of gait and mobility (R26.89);Pain Pain - Right/Left: Right Pain - part of body: Ankle and joints of foot     Time: 1045-1100 PT Time Calculation (min) (ACUTE ONLY): 15 min  Charges:  $Therapeutic Activity: 8-22 mins                    G Codes:       Lorrin Goodell, PT  Office # [phone] Pager [phone]    Lorriane Shire 12/26/2016, 11:11 AM

## 2016-12-27 DIAGNOSIS — D649 Anemia, unspecified: Secondary | ICD-10-CM | POA: Diagnosis not present

## 2016-12-27 DIAGNOSIS — I130 Hypertensive heart and chronic kidney disease with heart failure and stage 1 through stage 4 chronic kidney disease, or unspecified chronic kidney disease: Secondary | ICD-10-CM | POA: Diagnosis not present

## 2016-12-27 DIAGNOSIS — E440 Moderate protein-calorie malnutrition: Secondary | ICD-10-CM | POA: Diagnosis not present

## 2016-12-27 DIAGNOSIS — N183 Chronic kidney disease, stage 3 (moderate): Secondary | ICD-10-CM | POA: Diagnosis not present

## 2016-12-27 DIAGNOSIS — I5022 Chronic systolic (congestive) heart failure: Secondary | ICD-10-CM | POA: Diagnosis not present

## 2016-12-27 DIAGNOSIS — I255 Ischemic cardiomyopathy: Secondary | ICD-10-CM | POA: Diagnosis not present

## 2016-12-27 DIAGNOSIS — K922 Gastrointestinal hemorrhage, unspecified: Secondary | ICD-10-CM | POA: Diagnosis not present

## 2016-12-27 DIAGNOSIS — D696 Thrombocytopenia, unspecified: Secondary | ICD-10-CM | POA: Diagnosis not present

## 2016-12-27 DIAGNOSIS — I2510 Atherosclerotic heart disease of native coronary artery without angina pectoris: Secondary | ICD-10-CM | POA: Diagnosis not present

## 2016-12-28 DIAGNOSIS — D649 Anemia, unspecified: Secondary | ICD-10-CM | POA: Diagnosis not present

## 2016-12-28 DIAGNOSIS — N183 Chronic kidney disease, stage 3 (moderate): Secondary | ICD-10-CM | POA: Diagnosis not present

## 2016-12-28 DIAGNOSIS — E440 Moderate protein-calorie malnutrition: Secondary | ICD-10-CM | POA: Diagnosis not present

## 2016-12-28 DIAGNOSIS — I130 Hypertensive heart and chronic kidney disease with heart failure and stage 1 through stage 4 chronic kidney disease, or unspecified chronic kidney disease: Secondary | ICD-10-CM | POA: Diagnosis not present

## 2016-12-28 DIAGNOSIS — I5022 Chronic systolic (congestive) heart failure: Secondary | ICD-10-CM | POA: Diagnosis not present

## 2016-12-28 DIAGNOSIS — D696 Thrombocytopenia, unspecified: Secondary | ICD-10-CM | POA: Diagnosis not present

## 2016-12-28 DIAGNOSIS — I2510 Atherosclerotic heart disease of native coronary artery without angina pectoris: Secondary | ICD-10-CM | POA: Diagnosis not present

## 2016-12-28 DIAGNOSIS — I255 Ischemic cardiomyopathy: Secondary | ICD-10-CM | POA: Diagnosis not present

## 2016-12-28 DIAGNOSIS — K922 Gastrointestinal hemorrhage, unspecified: Secondary | ICD-10-CM | POA: Diagnosis not present

## 2016-12-31 DIAGNOSIS — D649 Anemia, unspecified: Secondary | ICD-10-CM | POA: Diagnosis not present

## 2016-12-31 DIAGNOSIS — K922 Gastrointestinal hemorrhage, unspecified: Secondary | ICD-10-CM | POA: Diagnosis not present

## 2016-12-31 DIAGNOSIS — E440 Moderate protein-calorie malnutrition: Secondary | ICD-10-CM | POA: Diagnosis not present

## 2016-12-31 DIAGNOSIS — N183 Chronic kidney disease, stage 3 (moderate): Secondary | ICD-10-CM | POA: Diagnosis not present

## 2016-12-31 DIAGNOSIS — I130 Hypertensive heart and chronic kidney disease with heart failure and stage 1 through stage 4 chronic kidney disease, or unspecified chronic kidney disease: Secondary | ICD-10-CM | POA: Diagnosis not present

## 2016-12-31 DIAGNOSIS — D696 Thrombocytopenia, unspecified: Secondary | ICD-10-CM | POA: Diagnosis not present

## 2016-12-31 DIAGNOSIS — I5022 Chronic systolic (congestive) heart failure: Secondary | ICD-10-CM | POA: Diagnosis not present

## 2016-12-31 DIAGNOSIS — I255 Ischemic cardiomyopathy: Secondary | ICD-10-CM | POA: Diagnosis not present

## 2016-12-31 DIAGNOSIS — I2510 Atherosclerotic heart disease of native coronary artery without angina pectoris: Secondary | ICD-10-CM | POA: Diagnosis not present

## 2017-01-02 DIAGNOSIS — D649 Anemia, unspecified: Secondary | ICD-10-CM | POA: Diagnosis not present

## 2017-01-02 DIAGNOSIS — I5022 Chronic systolic (congestive) heart failure: Secondary | ICD-10-CM | POA: Diagnosis not present

## 2017-01-02 DIAGNOSIS — D696 Thrombocytopenia, unspecified: Secondary | ICD-10-CM | POA: Diagnosis not present

## 2017-01-02 DIAGNOSIS — I255 Ischemic cardiomyopathy: Secondary | ICD-10-CM | POA: Diagnosis not present

## 2017-01-02 DIAGNOSIS — E440 Moderate protein-calorie malnutrition: Secondary | ICD-10-CM | POA: Diagnosis not present

## 2017-01-02 DIAGNOSIS — I2510 Atherosclerotic heart disease of native coronary artery without angina pectoris: Secondary | ICD-10-CM | POA: Diagnosis not present

## 2017-01-02 DIAGNOSIS — K922 Gastrointestinal hemorrhage, unspecified: Secondary | ICD-10-CM | POA: Diagnosis not present

## 2017-01-02 DIAGNOSIS — I130 Hypertensive heart and chronic kidney disease with heart failure and stage 1 through stage 4 chronic kidney disease, or unspecified chronic kidney disease: Secondary | ICD-10-CM | POA: Diagnosis not present

## 2017-01-02 DIAGNOSIS — N183 Chronic kidney disease, stage 3 (moderate): Secondary | ICD-10-CM | POA: Diagnosis not present

## 2017-01-03 ENCOUNTER — Ambulatory Visit (HOSPITAL_COMMUNITY): Payer: Medicare Other | Admitting: Physical Therapy

## 2017-01-03 DIAGNOSIS — K279 Peptic ulcer, site unspecified, unspecified as acute or chronic, without hemorrhage or perforation: Secondary | ICD-10-CM | POA: Diagnosis not present

## 2017-01-03 DIAGNOSIS — I10 Essential (primary) hypertension: Secondary | ICD-10-CM | POA: Diagnosis not present

## 2017-01-03 DIAGNOSIS — K922 Gastrointestinal hemorrhage, unspecified: Secondary | ICD-10-CM | POA: Diagnosis not present

## 2017-01-03 DIAGNOSIS — K270 Acute peptic ulcer, site unspecified, with hemorrhage: Secondary | ICD-10-CM | POA: Diagnosis not present

## 2017-01-03 DIAGNOSIS — J449 Chronic obstructive pulmonary disease, unspecified: Secondary | ICD-10-CM | POA: Diagnosis not present

## 2017-01-08 DIAGNOSIS — K922 Gastrointestinal hemorrhage, unspecified: Secondary | ICD-10-CM | POA: Diagnosis not present

## 2017-01-08 DIAGNOSIS — E440 Moderate protein-calorie malnutrition: Secondary | ICD-10-CM | POA: Diagnosis not present

## 2017-01-08 DIAGNOSIS — D649 Anemia, unspecified: Secondary | ICD-10-CM | POA: Diagnosis not present

## 2017-01-08 DIAGNOSIS — I255 Ischemic cardiomyopathy: Secondary | ICD-10-CM | POA: Diagnosis not present

## 2017-01-08 DIAGNOSIS — I2510 Atherosclerotic heart disease of native coronary artery without angina pectoris: Secondary | ICD-10-CM | POA: Diagnosis not present

## 2017-01-08 DIAGNOSIS — I5022 Chronic systolic (congestive) heart failure: Secondary | ICD-10-CM | POA: Diagnosis not present

## 2017-01-08 DIAGNOSIS — D696 Thrombocytopenia, unspecified: Secondary | ICD-10-CM | POA: Diagnosis not present

## 2017-01-08 DIAGNOSIS — I130 Hypertensive heart and chronic kidney disease with heart failure and stage 1 through stage 4 chronic kidney disease, or unspecified chronic kidney disease: Secondary | ICD-10-CM | POA: Diagnosis not present

## 2017-01-08 DIAGNOSIS — N183 Chronic kidney disease, stage 3 (moderate): Secondary | ICD-10-CM | POA: Diagnosis not present

## 2017-01-10 DIAGNOSIS — D649 Anemia, unspecified: Secondary | ICD-10-CM | POA: Diagnosis not present

## 2017-01-10 DIAGNOSIS — I5022 Chronic systolic (congestive) heart failure: Secondary | ICD-10-CM | POA: Diagnosis not present

## 2017-01-10 DIAGNOSIS — N183 Chronic kidney disease, stage 3 (moderate): Secondary | ICD-10-CM | POA: Diagnosis not present

## 2017-01-10 DIAGNOSIS — I255 Ischemic cardiomyopathy: Secondary | ICD-10-CM | POA: Diagnosis not present

## 2017-01-10 DIAGNOSIS — I130 Hypertensive heart and chronic kidney disease with heart failure and stage 1 through stage 4 chronic kidney disease, or unspecified chronic kidney disease: Secondary | ICD-10-CM | POA: Diagnosis not present

## 2017-01-10 DIAGNOSIS — K922 Gastrointestinal hemorrhage, unspecified: Secondary | ICD-10-CM | POA: Diagnosis not present

## 2017-01-10 DIAGNOSIS — E440 Moderate protein-calorie malnutrition: Secondary | ICD-10-CM | POA: Diagnosis not present

## 2017-01-10 DIAGNOSIS — D696 Thrombocytopenia, unspecified: Secondary | ICD-10-CM | POA: Diagnosis not present

## 2017-01-10 DIAGNOSIS — I2510 Atherosclerotic heart disease of native coronary artery without angina pectoris: Secondary | ICD-10-CM | POA: Diagnosis not present

## 2017-01-23 ENCOUNTER — Encounter (HOSPITAL_COMMUNITY): Payer: Self-pay | Admitting: *Deleted

## 2017-01-23 ENCOUNTER — Emergency Department (HOSPITAL_COMMUNITY): Payer: Medicare Other

## 2017-01-23 ENCOUNTER — Emergency Department (HOSPITAL_COMMUNITY)
Admission: EM | Admit: 2017-01-23 | Discharge: 2017-01-23 | Disposition: A | Discharge status: Home / Self Care | Payer: Medicare Other | Attending: Emergency Medicine | Admitting: Emergency Medicine

## 2017-01-23 DIAGNOSIS — Z79899 Other long term (current) drug therapy: Secondary | ICD-10-CM | POA: Diagnosis not present

## 2017-01-23 DIAGNOSIS — M25552 Pain in left hip: Secondary | ICD-10-CM | POA: Diagnosis not present

## 2017-01-23 DIAGNOSIS — M79645 Pain in left finger(s): Secondary | ICD-10-CM | POA: Diagnosis not present

## 2017-01-23 DIAGNOSIS — F17200 Nicotine dependence, unspecified, uncomplicated: Secondary | ICD-10-CM | POA: Diagnosis not present

## 2017-01-23 DIAGNOSIS — I2510 Atherosclerotic heart disease of native coronary artery without angina pectoris: Secondary | ICD-10-CM | POA: Insufficient documentation

## 2017-01-23 DIAGNOSIS — M7989 Other specified soft tissue disorders: Secondary | ICD-10-CM | POA: Diagnosis not present

## 2017-01-23 DIAGNOSIS — I5022 Chronic systolic (congestive) heart failure: Secondary | ICD-10-CM | POA: Diagnosis not present

## 2017-01-23 DIAGNOSIS — M79642 Pain in left hand: Secondary | ICD-10-CM | POA: Diagnosis present

## 2017-01-23 DIAGNOSIS — N183 Chronic kidney disease, stage 3 (moderate): Secondary | ICD-10-CM | POA: Diagnosis not present

## 2017-01-23 NOTE — ED Triage Notes (Signed)
Pt comes in with left hand pain starting 3 days ago. He went to his PCP today and they told him to come here. Pt denies any injury. He has tenderness to left hand moving into his palm.

## 2017-01-23 NOTE — ED Provider Notes (Signed)
[facility] DEPT Provider Note   CSN: [id number] Arrival date & time: 01/23/17  1401     History   Chief Complaint Chief Complaint  Patient presents with  . Hand Pain    HPI [name] is a 71 y.o. male.  The history is provided by the patient.  Hand Pain  This is a new problem. The current episode started more than 2 days ago. The problem occurs constantly. The problem has been gradually worsening. Nothing aggravates the symptoms. Nothing relieves the symptoms. He has tried nothing for the symptoms. The treatment provided no relief.   Pt complains of pain in his left hand.  Pain is worse with moving.   Past Medical History:  Diagnosis Date  . Arteriosclerotic cardiovascular disease (ASCVD)    IMI in 5/07 with CTO mid cx, 60% LAD, 99% RCA-> BMS; mod. impaired LV function; EF:35-40% 2/10  . Chronic kidney disease    STAGE 3-4-creatinine 2.5 in 2009; 1.7 in 10/2008  . Cirrhosis ([place])    History of excessive alcohol use; continuing social use  . Gastroesophageal reflux disease   . Hyperlipidemia   . Tobacco abuse    60 pack years; one pack per day    Patient Active Problem List   Diagnosis Date Noted  . Hematochezia   . Chronic systolic congestive heart failure ([place])   . Hemorrhagic shock and encephalopathy syndrome ([place])   . Acute upper GI bleed   . Gastric ulcer 12/20/2016  . Acute blood loss anemia 12/20/2016  . Hypotension 12/18/2016  . Volume depletion 12/18/2016  . Ischemic cardiomyopathy 12/18/2016  . Anemia 12/18/2016  . Heme + stool 12/18/2016  . Dehydration   . Thrombocytopenia ([place])   . Malnutrition of moderate degree 12/15/2016  . AKI (acute kidney injury) ([place]) 12/13/2016  . Unintentional weight loss 12/13/2016  . Alcohol dependence ([place]) 12/13/2016  . Hyperglycemia 12/13/2016  . Elevated troponin 12/13/2016  . Anemia, normocytic normochromic 03/28/2012  . Coronary artery disease   . Gastroesophageal reflux disease   . Chronic kidney  disease   . Cirrhosis ([place])   . Tobacco abuse     Past Surgical History:  Procedure Laterality Date  . ESOPHAGOGASTRODUODENOSCOPY N/A 12/19/2016   Procedure: ESOPHAGOGASTRODUODENOSCOPY (EGD);  Surgeon: Rogene Houston, MD;  [facility];  Service: Endoscopy;  Laterality: N/A;  . ESOPHAGOGASTRODUODENOSCOPY N/A 12/21/2016   Procedure: ESOPHAGOGASTRODUODENOSCOPY (EGD);  Surgeon: Danie Binder, MD;  [facility];  Service: Endoscopy;  Laterality: N/A;  . ESOPHAGOGASTRODUODENOSCOPY (EGD) WITH PROPOFOL N/A 12/22/2016   Procedure: ESOPHAGOGASTRODUODENOSCOPY (EGD) WITH PROPOFOL;  Surgeon: Mauri Pole, MD;  Location: [facility];  Service: Endoscopy;  Laterality: N/A;       Home Medications    Prior to Admission medications   Medication Sig Start Date End Date Taking? Authorizing Provider  bisacodyl (DULCOLAX) 10 MG suppository Place 1 suppository (10 mg total) rectally daily as needed for moderate constipation. 12/26/16   Reyne Dumas, MD  carvedilol (COREG) 12.5 MG tablet TAKE 1 TABLET BY MOUTH TWICE DAILY. 07/07/14   Herminio Commons, MD  castor oil liquid Take 10 mLs by mouth daily as needed for moderate constipation.    [provider]  colchicine 0.6 MG tablet Take 1 tablet (0.6 mg total) by mouth 2 (two) times daily. 12/26/16 01/02/17  Reyne Dumas, MD  docusate sodium (COLACE) 100 MG capsule Take 1 capsule (100 mg total) by mouth 2 (two) times daily. Patient taking differently: Take 100 mg  by mouth at bedtime.  12/17/16   [name], MD  feeding supplement, ENSURE ENLIVE, (ENSURE ENLIVE) LIQD Take 237 mLs by mouth 2 (two) times daily between meals. 12/17/16   Dondiego, Delfino Lovett, MD  folic acid (FOLVITE) 1 MG tablet Take 1 tablet (1 mg total) by mouth daily. 12/17/16   [name], MD  magnesium oxide (MAG-OX) 400 (241.3 Mg) MG tablet Take 1 tablet (400 mg total) by mouth daily. 12/27/16   Reyne Dumas, MD  Multiple Vitamin (DAILY VITE) TABS  TAKE ONE TABLET BY MOUTH ONCE DAILY.    Lendon Colonel, NP  nitroGLYCERIN (NITROSTAT) 0.4 MG SL tablet Place 0.4 mg under the tongue as needed.      [provider]  pantoprazole (PROTONIX) 40 MG tablet Take 1 tablet (40 mg total) by mouth 2 (two) times daily. 12/26/16   Reyne Dumas, MD  sucralfate (CARAFATE) 1 GM/10ML suspension Take 10 mLs (1 g total) by mouth 4 (four) times daily -  with meals and at bedtime. 12/26/16   Reyne Dumas, MD  thiamine 100 MG tablet Take 1 tablet (100 mg total) by mouth daily. 12/27/16   Reyne Dumas, MD    Family History No family history on file.  Social History Social History  Substance Use Topics  . Smoking status: Current Every Day Smoker    Packs/day: 1.00    Years: 60.00  . Smokeless tobacco: Never Used  . Alcohol use 0.0 oz/week     Comment: per sister "he had one beer last week"     Allergies   Patient has no known allergies.   Review of Systems Review of Systems  All other systems reviewed and are negative.    Physical Exam Updated Vital Signs BP 126/64   Pulse 90   Temp 98.4 F (36.9 C) (Oral)   Resp 18   SpO2 97%   Physical Exam  Constitutional: He is oriented to person, place, and time. He appears well-developed and well-nourished.  HENT:  Head: Normocephalic and atraumatic.  Right Ear: External ear normal.  Left Ear: External ear normal.  Eyes: Conjunctivae and EOM are normal. Pupils are equal, round, and reactive to light.  Musculoskeletal: Normal range of motion.  Swollen tender left hand,  nv and ns intact  Pain with movement.   Neurological: He is alert and oriented to person, place, and time.  Skin: Skin is warm.  Psychiatric: He has a normal mood and affect.     ED Treatments / Results  Labs (all labs ordered are listed, but only abnormal results are displayed) Labs Reviewed - No data to display  EKG  EKG Interpretation None       Radiology Dg Hand Complete Left  Result Date:  01/23/2017 CLINICAL DATA:  Severe pain at the base of the thumb radiating into the hand for several days. EXAM: LEFT HAND - COMPLETE 3+ VIEW COMPARISON:  None in PACs FINDINGS: The bones are subjectively adequately mineralized. There is no acute or healing fracture. There is no dislocation. The interphalangeal and metacarpophalangeal joint spaces are well-maintained. The carpometacarpal joints appear normal. The carpal bones and distal radius and ulna exhibit no acute abnormalities. IMPRESSION: There is no acute or significant chronic bony abnormality of the left hand. Specific attention to the thumb and first [facility] joint reveals no acute abnormality. Electronically Signed   By: David  Martinique M.D.   On: 01/23/2017 14:28    Procedures Procedures (including critical care time)  Medications Ordered in ED Medications -  No data to display   Initial Impression / Assessment and Plan / ED Course  I have reviewed the triage vital signs and the nursing notes.  Pertinent labs & imaging results that were available during my care of the patient were reviewed by me and considered in my medical decision making (see chart for details).       Final Clinical Impressions(s) / ED Diagnoses   Final diagnoses:  Hand pain, left    New Prescriptions New Prescriptions   No medications on file  ace wrap avs See Dr. Luan Pulling for recheck in 1 week if pain persist.   Fransico Meadow, PA-C 01/23/17 1600    Julianne Rice, MD 01/28/17 1521

## 2017-01-23 NOTE — Discharge Instructions (Signed)
See Dr. Luan Pulling for recheck if pain persist

## 2017-04-03 ENCOUNTER — Encounter (INDEPENDENT_AMBULATORY_CARE_PROVIDER_SITE_OTHER): Payer: Self-pay | Admitting: Internal Medicine

## 2017-04-03 ENCOUNTER — Encounter (INDEPENDENT_AMBULATORY_CARE_PROVIDER_SITE_OTHER): Payer: Self-pay

## 2017-04-03 ENCOUNTER — Ambulatory Visit (INDEPENDENT_AMBULATORY_CARE_PROVIDER_SITE_OTHER): Payer: Medicare Other | Admitting: Internal Medicine

## 2017-04-03 VITALS — BP 136/82 | HR 64 | Temp 98.1°F | Resp 16 | Ht 66.0 in | Wt 129.5 lb

## 2017-04-03 DIAGNOSIS — K921 Melena: Secondary | ICD-10-CM | POA: Diagnosis not present

## 2017-04-03 LAB — CBC WITH DIFFERENTIAL/PLATELET
BASOS ABS: 53 {cells}/uL (ref 0–200)
Basophils Relative: 1 %
EOS ABS: 265 {cells}/uL (ref 15–500)
EOS PCT: 5 %
HCT: 33.9 % — ABNORMAL LOW (ref 38.5–50.0)
Hemoglobin: 11.2 g/dL — ABNORMAL LOW (ref 13.2–17.1)
LYMPHS ABS: 2438 {cells}/uL (ref 850–3900)
Lymphocytes Relative: 46 %
MCH: 28.9 pg (ref 27.0–33.0)
MCHC: 33 g/dL (ref 32.0–36.0)
MCV: 87.4 fL (ref 80.0–100.0)
MPV: 10 fL (ref 7.5–12.5)
Monocytes Absolute: 424 cells/uL (ref 200–950)
Monocytes Relative: 8 %
Neutro Abs: 2120 cells/uL (ref 1500–7800)
Neutrophils Relative %: 40 %
Platelets: 144 10*3/uL (ref 140–400)
RBC: 3.88 MIL/uL — ABNORMAL LOW (ref 4.20–5.80)
RDW: 15.3 % — ABNORMAL HIGH (ref 11.0–15.0)
WBC: 5.3 10*3/uL (ref 3.8–10.8)

## 2017-04-03 NOTE — Progress Notes (Signed)
Subjective:    Patient ID: [name], male    DOB: 08/18/1946, 71 y.o.   MRN: [id number] PCP Dr Luan Pulling.  HPI Here today for f/u. Underwent 3 EGD x 3 in April for acute hemorrhagic anemia.  He did have a drop in his hemoglobin. Hx of heart disease, CKD and cirrhosis. States he is doing good. His appetite is good. He is living with his sister. He occasionally has a beer.  He is having 1-2 stools a day. He denies any melena.   12/22/2016 EGD:  Impression:               - Normal esophagus.                           - Non-obstructing non-bleeding gastric ulcer with a                            visible vessel. NSAID induced etiology. Treated                            with bipolar cautery. Clip (MR conditional) was                            placed adjacent to the ulcer for location marking                            to facilitate visulization on angio or CTA.                           - Normal examined duodenum.                           - No specimens collected.  12/21/2016 EGD melena, hematemesis      OBSCURED BY CLOT. Impression:               - HYPOTENSION/HEMATEMESIS/MELENA DUE TO LARGE                            gastric ulcer with a visible vessel. Injected.    12/19/2016 EGD                           - Z-line irregular, 38 cm from the incisors.                           - 2 cm hiatal hernia.                           - Granular and nodular mucosa in the gastric fundus                            and gastric body. Biopsied.                           - Non-bleeding gastric ulcer with pigmented  material. Biopsied.                           - Normal duodenal bulb and second portion of  CBC    Component Value Date/Time   WBC 5.6 12/26/2016 0215   RBC 3.16 (L) 12/26/2016 0215   HGB 9.1 (L) 12/26/2016 1406   HCT 26.9 (L) 12/26/2016 1406   PLT 125 (L) 12/26/2016 0215   MCV 85.4 12/26/2016 0215   MCH 29.1 12/26/2016 0215   MCHC 34.1 12/26/2016  0215   RDW 14.9 12/26/2016 0215   LYMPHSABS 2.3 12/20/2016 0830   MONOABS 0.2 12/20/2016 0830   EOSABS 0.4 12/20/2016 0830   BASOSABS 0.0 12/20/2016 0830     Review of Systems  Past Medical History:  Diagnosis Date  . Arteriosclerotic cardiovascular disease (ASCVD)    IMI in 5/07 with CTO mid cx, 60% LAD, 99% RCA-> BMS; mod. impaired LV function; EF:35-40% 2/10  . Chronic kidney disease    STAGE 3-4-creatinine 2.5 in 2009; 1.7 in 10/2008  . Cirrhosis ([place])    History of excessive alcohol use; continuing social use  . Gastroesophageal reflux disease   . Hyperlipidemia   . Tobacco abuse    60 pack years; one pack per day    Past Surgical History:  Procedure Laterality Date  . ESOPHAGOGASTRODUODENOSCOPY N/A 12/19/2016   Procedure: ESOPHAGOGASTRODUODENOSCOPY (EGD);  Surgeon: Rogene Houston, MD;  [facility];  Service: Endoscopy;  Laterality: N/A;  . ESOPHAGOGASTRODUODENOSCOPY N/A 12/21/2016   Procedure: ESOPHAGOGASTRODUODENOSCOPY (EGD);  Surgeon: Danie Binder, MD;  [facility];  Service: Endoscopy;  Laterality: N/A;  . ESOPHAGOGASTRODUODENOSCOPY (EGD) WITH PROPOFOL N/A 12/22/2016   Procedure: ESOPHAGOGASTRODUODENOSCOPY (EGD) WITH PROPOFOL;  Surgeon: Mauri Pole, MD;  Location: [facility] END[facility]COPY;  Service: Endoscopy;  Laterality: N/A;    No Known Allergies  Current Outpatient Prescriptions on File Prior to Visit  Medication Sig Dispense Refill  . bisacodyl (DULCOLAX) 10 MG suppository Place 1 suppository (10 mg total) rectally daily as needed for moderate constipation. 12 suppository 0  . carvedilol (COREG) 12.5 MG tablet TAKE 1 TABLET BY MOUTH TWICE DAILY. 60 tablet 0  . castor oil liquid Take 10 mLs by mouth daily as needed for moderate constipation.    . docusate sodium (COLACE) 100 MG capsule Take 1 capsule (100 mg total) by mouth 2 (two) times daily. (Patient taking differently: Take 100 mg by mouth at bedtime. ) 10 capsule 0  . feeding supplement,  ENSURE ENLIVE, (ENSURE ENLIVE) LIQD Take 237 mLs by mouth 2 (two) times daily between meals. 536 mL 12  . folic acid (FOLVITE) 1 MG tablet Take 1 tablet (1 mg total) by mouth daily. 30 tablet 3  . magnesium oxide (MAG-OX) 400 (241.3 Mg) MG tablet Take 1 tablet (400 mg total) by mouth daily. 30 tablet 0  . Multiple Vitamin (DAILY VITE) TABS TAKE ONE TABLET BY MOUTH ONCE DAILY. 90 tablet 1  . nitroGLYCERIN (NITROSTAT) 0.4 MG SL tablet Place 0.4 mg under the tongue as needed.      . pantoprazole (PROTONIX) 40 MG tablet Take 1 tablet (40 mg total) by mouth 2 (two) times daily. 60 tablet 1  . sucralfate (CARAFATE) 1 GM/10ML suspension Take 10 mLs (1 g total) by mouth 4 (four) times daily -  with meals and at bedtime. 420 mL 0  . thiamine 100 MG tablet Take 1 tablet (100 mg total) by mouth daily. [place]  tablet 2  . colchicine 0.6 MG tablet Take 1 tablet (0.6 mg total) by mouth 2 (two) times daily. 14 tablet 0   No current facility-administered medications on file prior to visit.         Objective:   Physical Exam Blood pressure 136/82, pulse 64, temperature 98.1 F (36.7 C), resp. rate 16, height 5\' 6"  (1.676 m), weight 129 lb 8 oz (58.7 kg). Alert and oriented. Skin warm and dry. Oral mucosa is moist.   . Sclera anicteric, conjunctivae is pink. Thyroid not enlarged. No cervical lymphadenopathy. Lungs clear. Heart regular rate and rhythm.  Abdomen is soft. Bowel sounds are positive. No hepatomegaly. No abdominal masses felt. No tenderness.  No edema to lower extremities.  .         Assessment & Plan:  Gastric ulcer. Recent hx of upper GI bleed. No GI complaints today.  CBC today,.  OV in 6 months

## 2017-04-03 NOTE — Patient Instructions (Addendum)
OV in 6 months.  CBC today

## 2017-04-08 ENCOUNTER — Other Ambulatory Visit (INDEPENDENT_AMBULATORY_CARE_PROVIDER_SITE_OTHER): Payer: Self-pay | Admitting: *Deleted

## 2017-04-08 DIAGNOSIS — K921 Melena: Secondary | ICD-10-CM

## 2017-05-11 IMAGING — US US RENAL
1 series · 14 of 25 positions shown · non-contrast
Comparison: None.

CLINICAL DATA: Acute tubular necrosis, proteinuria a

EXAM:
RENAL / URINARY TRACT ULTRASOUND COMPLETE

[Series 1: us renal · 0.24mm/px · 14 of 52 slices shown]
[im 1/52]
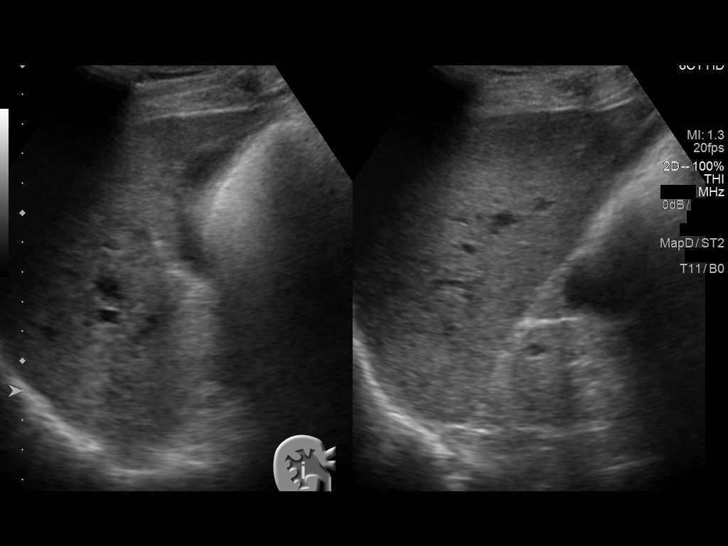
[im 5/52]
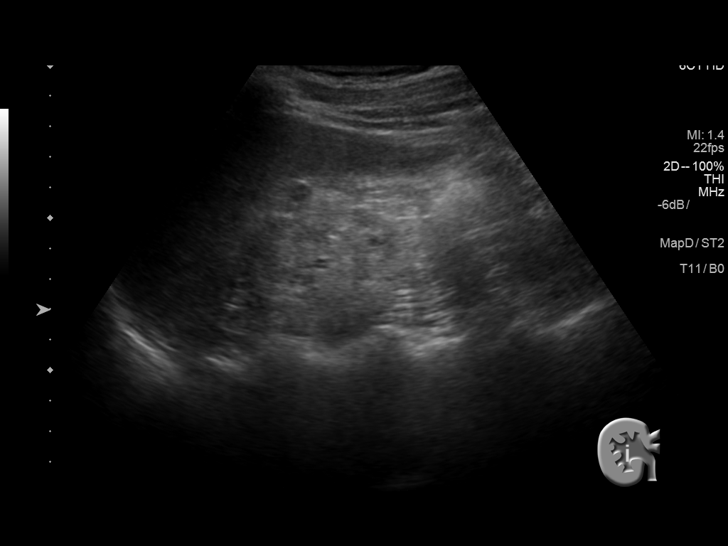
[im 9/52]
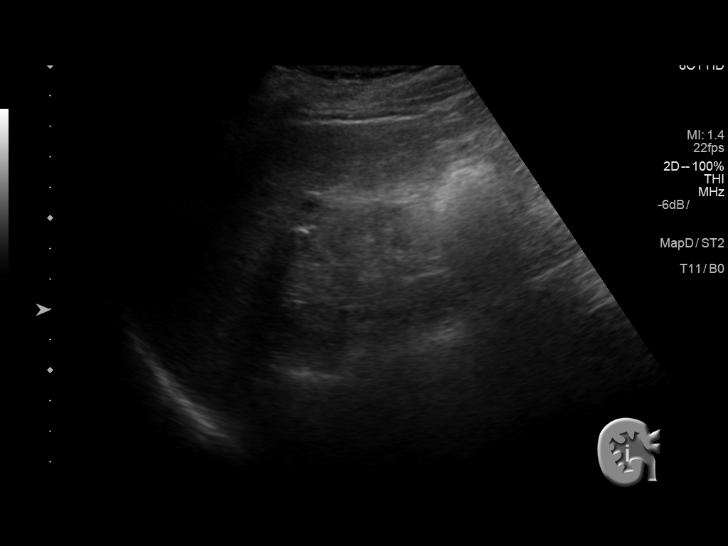
[im 13/52]
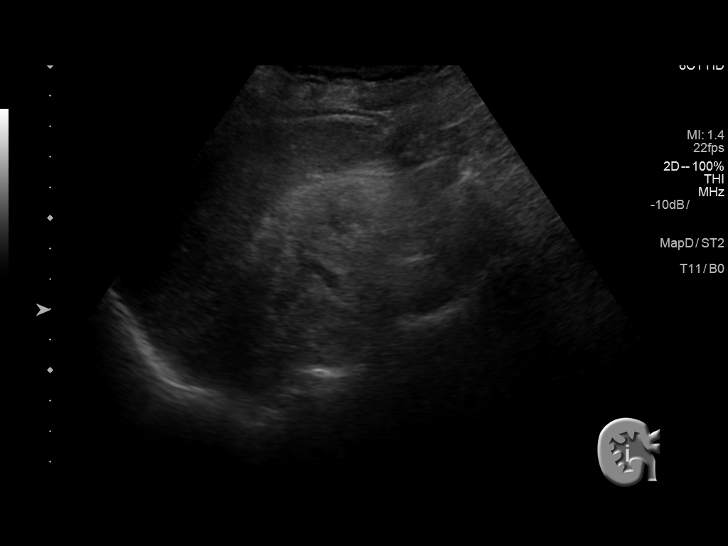
[im 18/52]
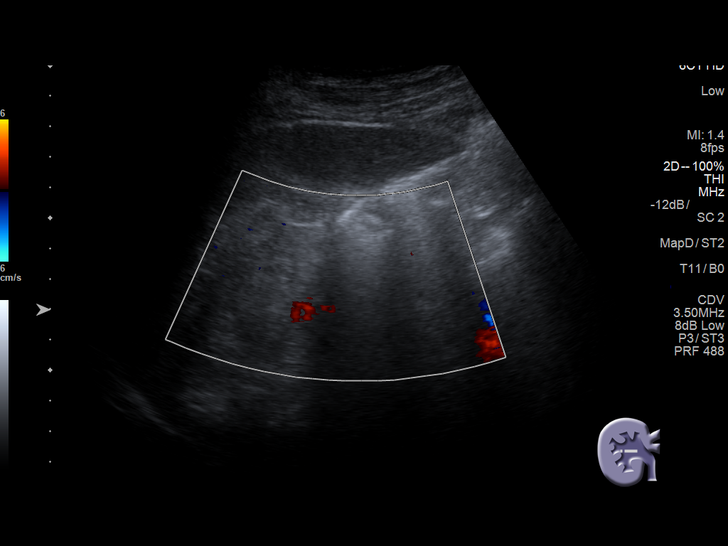
[im 20/52]
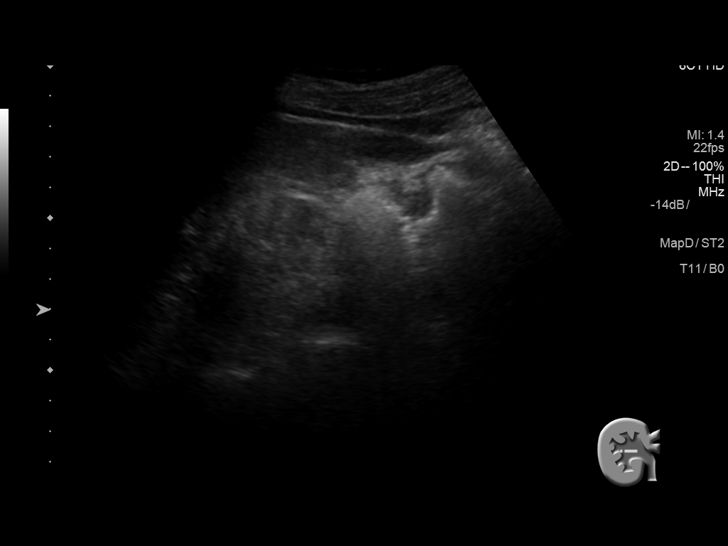
[im 24/52]
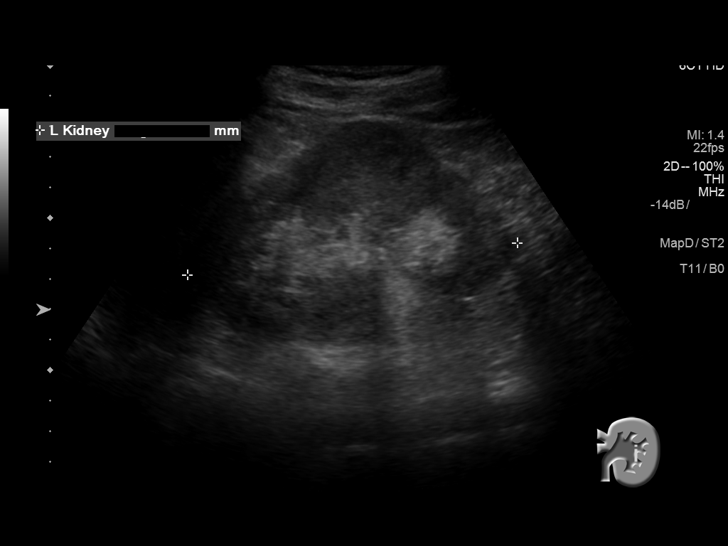
[im 28/52]
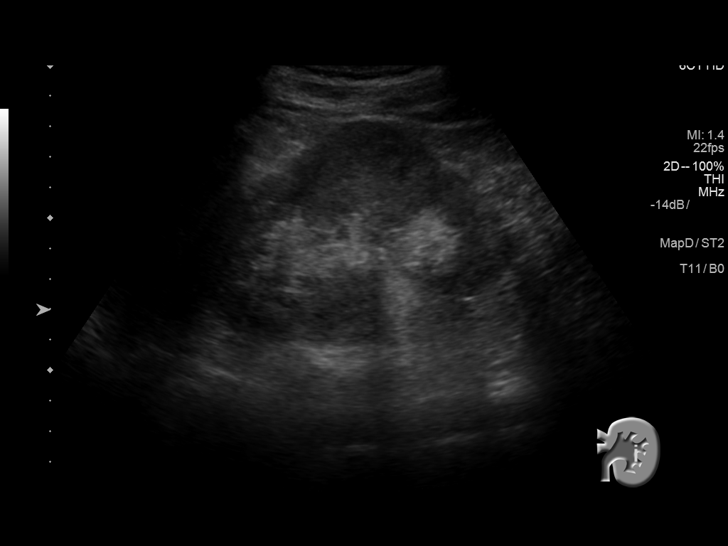
[im 32/52]
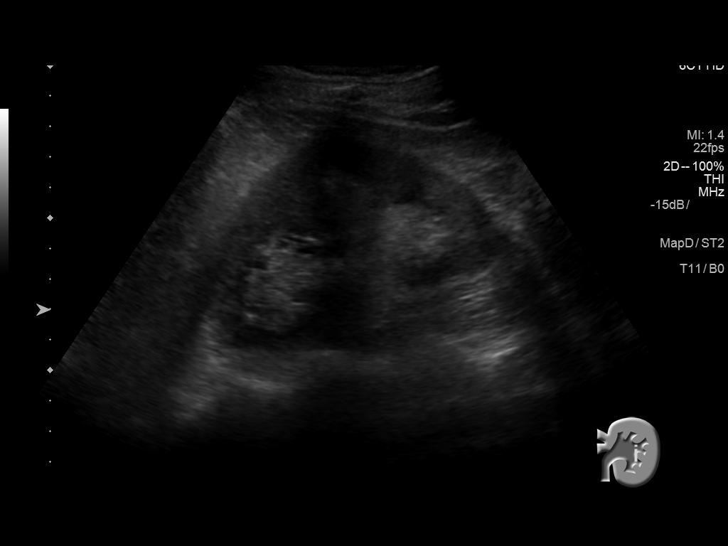
[im 35/52]
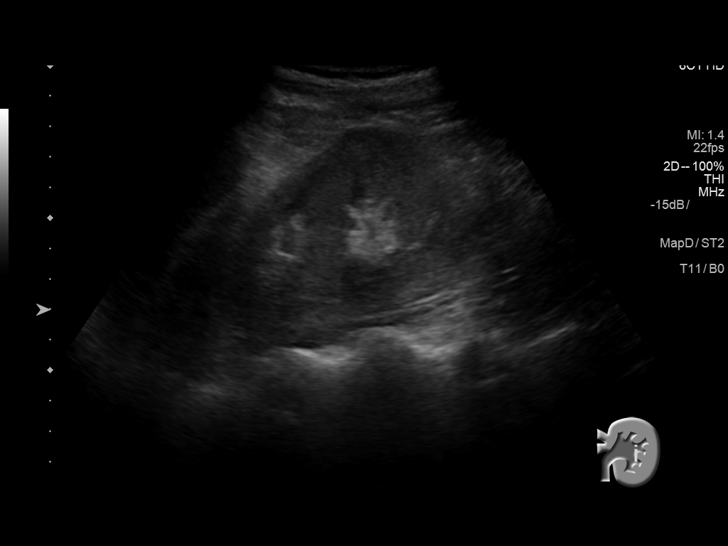
[im 39/52]
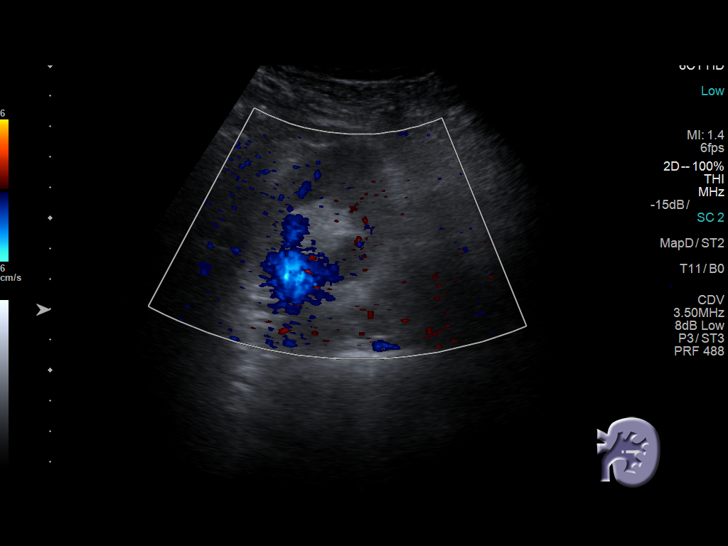
[im 43/52]
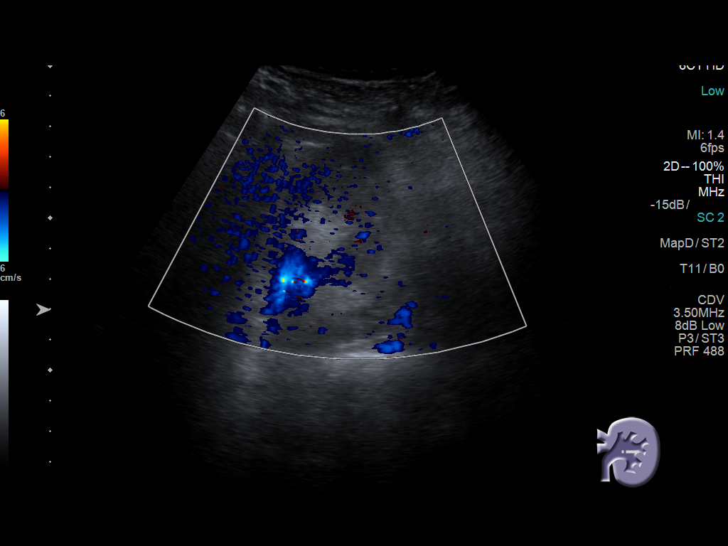
[im 47/52]
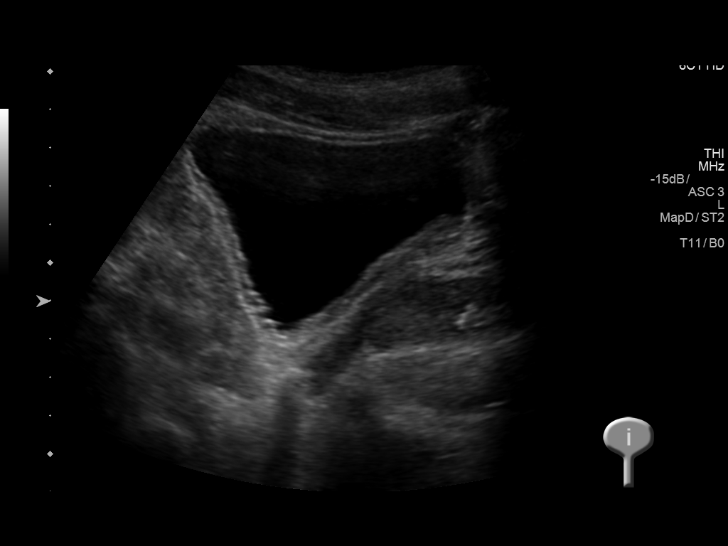
[im 52/52]
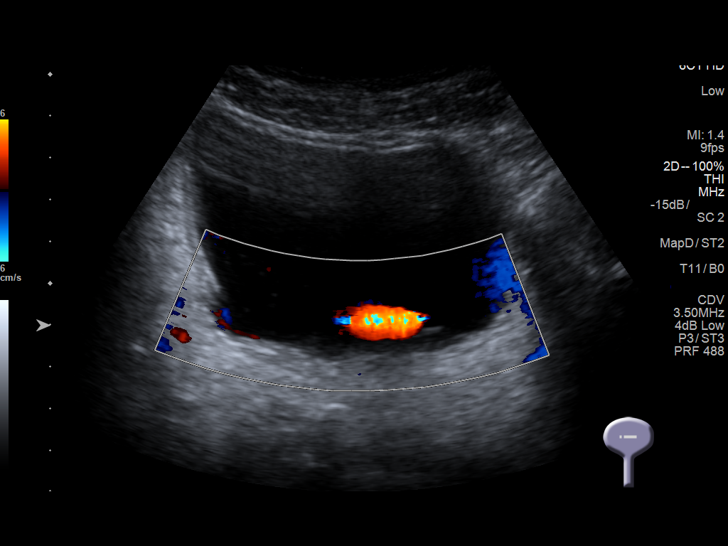

[14 of 25 positions shown; findings below may reference images not displayed]

FINDINGS: Right Kidney:

Length: 5.8 cm.  Right kidney is atrophic without hydronephrosis.

Left Kidney:

Length: 10.9 cm.. Echogenicity within normal limits. No mass or
hydronephrosis visualized.

Bladder:

Appears normal for degree of bladder distention. Only left ureteral
jet is visualized.
IMPRESSION: 1. Atrophic right kidney without hydronephrosis measures 5.8 cm in
length.
2. Normal size left kidney. No left hydronephrosis. Only left
ureteral jet is visualized. The urinary bladder is unremarkable.

## 2017-06-13 ENCOUNTER — Encounter (INDEPENDENT_AMBULATORY_CARE_PROVIDER_SITE_OTHER): Payer: Self-pay | Admitting: *Deleted

## 2017-06-13 ENCOUNTER — Other Ambulatory Visit (INDEPENDENT_AMBULATORY_CARE_PROVIDER_SITE_OTHER): Payer: Self-pay | Admitting: *Deleted

## 2017-06-13 DIAGNOSIS — K921 Melena: Secondary | ICD-10-CM

## 2017-10-07 ENCOUNTER — Encounter (INDEPENDENT_AMBULATORY_CARE_PROVIDER_SITE_OTHER): Payer: Self-pay | Admitting: Internal Medicine

## 2017-10-07 ENCOUNTER — Ambulatory Visit (INDEPENDENT_AMBULATORY_CARE_PROVIDER_SITE_OTHER): Payer: Medicare Other | Admitting: Internal Medicine

## 2017-10-07 VITALS — BP 122/70 | HR 64 | Temp 97.5°F | Ht 66.0 in | Wt 129.0 lb

## 2017-10-07 DIAGNOSIS — K922 Gastrointestinal hemorrhage, unspecified: Secondary | ICD-10-CM | POA: Diagnosis not present

## 2017-10-07 LAB — CBC
HCT: 38.2 % — ABNORMAL LOW (ref 38.5–50.0)
HEMOGLOBIN: 13.1 g/dL — AB (ref 13.2–17.1)
MCH: 29.8 pg (ref 27.0–33.0)
MCHC: 34.3 g/dL (ref 32.0–36.0)
MCV: 86.8 fL (ref 80.0–100.0)
MPV: 10.8 fL (ref 7.5–12.5)
Platelets: 143 10*3/uL (ref 140–400)
RBC: 4.4 10*6/uL (ref 4.20–5.80)
RDW: 13.1 % (ref 11.0–15.0)
WBC: 5.2 10*3/uL (ref 3.8–10.8)

## 2017-10-07 NOTE — Progress Notes (Signed)
Subjective:    Patient ID: [name], male    DOB: May 24, 1946, 72 y.o.   MRN: [id number]  HPI Here today for f/u. Last seen in July of 2018.  Underwent EGD x 3 in April for acute hemorrhagic anemia. He did have a drop in his hemoglobin requiring transfusion.  Hx of Heart disease, CKD and cirrhosis.  He was transferred to [facility] on 12/21/2016 after 2nd EGD revealed a large visible vessel in the gastric ulcer base. The ulcer was oozing and there was red blood and clots in the stomach. The ulcer base was injected with epinephrine. He was transferred to [facility] in the  Event IR embolization became necessary. IR saw patient and patient was not actively bleeding and there was no plan for intervention at that time.  He lives with his sister. He says he is doing good. Eating 3 meals a day. His appetite is good. His weight has remained the same.  He denies any abdominal pain.  He has a BM daily. No melena or BRRB.  He has a beer now and then . Is not taking any NSAIDs.   CBC    Component Value Date/Time   WBC 5.3 04/03/2017 1542   RBC 3.88 (L) 04/03/2017 1542   HGB 11.2 (L) 04/03/2017 1542   HCT 33.9 (L) 04/03/2017 1542   PLT 144 04/03/2017 1542   MCV 87.4 04/03/2017 1542   MCH 28.9 04/03/2017 1542   MCHC 33.0 04/03/2017 1542   RDW 15.3 (H) 04/03/2017 1542   LYMPHSABS 2,438 04/03/2017 1542   MONOABS 424 04/03/2017 1542   EOSABS 265 04/03/2017 1542   BASOSABS 53 04/03/2017 1542       12/22/2016 EGD:  Impression: - Normal esophagus. - Non-obstructing non-bleeding gastric ulcer with a  visible vessel. NSAID induced etiology. Treated  with bipolar cautery. Clip (MR conditional) was  placed adjacent to the ulcer for location marking  to facilitate visulization on angio or CTA. - Normal examined  duodenum. - No specimens collected.  12/21/2016 EGD melena, hematemesis OBSCURED BY CLOT. Impression: - HYPOTENSION/HEMATEMESIS/MELENA DUE TO LARGE  gastric ulcer with a visible vessel. Injected.    12/19/2016 EGD - Z-line irregular, 38 cm from the incisors. - 2 cm hiatal hernia. - Granular and nodular mucosa in the gastric fundus  and gastric body. Biopsied. - Non-bleeding gastric ulcer with pigmented  material. Biopsied. - Normal duodenal bulb and second portion of    Review of Systems Past Medical History:  Diagnosis Date  . Arteriosclerotic cardiovascular disease (ASCVD)    IMI in 5/07 with CTO mid cx, 60% LAD, 99% RCA-> BMS; mod. impaired LV function; EF:35-40% 2/10  . Chronic kidney disease    STAGE 3-4-creatinine 2.5 in 2009; 1.7 in 10/2008  . Cirrhosis ([place])    History of excessive alcohol use; continuing social use  . Gastroesophageal reflux disease   . Hyperlipidemia   . Tobacco abuse    60 pack years; one pack per day    Past Surgical History:  Procedure Laterality Date  . ESOPHAGOGASTRODUODENOSCOPY N/A 12/19/2016   Procedure: ESOPHAGOGASTRODUODENOSCOPY (EGD);  Surgeon: Rogene Houston, MD;  [facility];  Service: Endoscopy;  Laterality: N/A;  . ESOPHAGOGASTRODUODENOSCOPY N/A 12/21/2016   Procedure: ESOPHAGOGASTRODUODENOSCOPY (EGD);  Surgeon: Danie Binder, MD;  [facility];  Service: Endoscopy;  Laterality: N/A;  . ESOPHAGOGASTRODUODENOSCOPY (EGD) WITH PROPOFOL N/A 12/22/2016   Procedure: ESOPHAGOGASTRODUODENOSCOPY (EGD) WITH PROPOFOL;  Surgeon: Mauri Pole, MD;  Location: [facility];  Service: Endoscopy;  Laterality: N/A;    No Known Allergies  Current Outpatient Medications on File  Prior to Visit  Medication Sig Dispense Refill  . atorvastatin (LIPITOR) 40 MG tablet     . bisacodyl (DULCOLAX) 10 MG suppository Place 1 suppository (10 mg total) rectally daily as needed for moderate constipation. 12 suppository 0  . carvedilol (COREG) 12.5 MG tablet TAKE 1 TABLET BY MOUTH TWICE DAILY. 60 tablet 0  . castor oil liquid Take 10 mLs by mouth daily as needed for moderate constipation.    . docusate sodium (COLACE) 100 MG capsule Take 1 capsule (100 mg total) by mouth 2 (two) times daily. (Patient taking differently: Take 100 mg by mouth at bedtime. ) 10 capsule 0  . feeding supplement, ENSURE ENLIVE, (ENSURE ENLIVE) LIQD Take 237 mLs by mouth 2 (two) times daily between meals. 694 mL 12  . folic acid (FOLVITE) 1 MG tablet Take 1 tablet (1 mg total) by mouth daily. 30 tablet 3  . furosemide (LASIX) 40 MG tablet     . lisinopril (PRINIVIL,ZESTRIL) 10 MG tablet     . magnesium oxide (MAG-OX) 400 (241.3 Mg) MG tablet Take 1 tablet (400 mg total) by mouth daily. 30 tablet 0  . Multiple Vitamin (DAILY VITE) TABS TAKE ONE TABLET BY MOUTH ONCE DAILY. 90 tablet 1  . nitroGLYCERIN (NITROSTAT) 0.4 MG SL tablet Place 0.4 mg under the tongue as needed.      . pantoprazole (PROTONIX) 40 MG tablet Take 1 tablet (40 mg total) by mouth 2 (two) times daily. 60 tablet 1  . thiamine 100 MG tablet Take 1 tablet (100 mg total) by mouth daily. 30 tablet 2  . colchicine 0.6 MG tablet Take 1 tablet (0.6 mg total) by mouth 2 (two) times daily. 14 tablet 0   No current facility-administered medications on file prior to visit.         Objective:   Physical Exam Blood pressure 122/70, pulse 64, temperature (!) 97.5 F (36.4 C), height 5\' 6"  (1.676 m), weight 129 lb (58.5 kg). Alert and oriented. Skin warm and dry. Oral mucosa is moist.   . Sclera anicteric, conjunctivae is pink. Thyroid not enlarged. No cervical lymphadenopathy. Lungs clear. Heart regular rate and rhythm.  Abdomen is soft. Bowel sounds  are positive. No hepatomegaly. No abdominal masses felt. No tenderness.  No edema to lower extremities.           Assessment & Plan:  UGIB. CBC in July revealed a hemoglobin of 11.2. ([facility] did not have blood draw for October). Will repeat CBC today.

## 2017-10-07 NOTE — Patient Instructions (Signed)
CBC today. No ASA, Motrin or anything with ASA. OV in 6 months.

## 2017-12-30 ENCOUNTER — Ambulatory Visit: Payer: Self-pay

## 2017-12-30 NOTE — Telephone Encounter (Signed)
Patient's wife called and asked who her husband's PCP is and she said Dr. Luan Pulling. I advised we are the triage nurses for the [facility] and she would need to call his providers office for this problem, she verbalized understanding.

## 2018-04-07 ENCOUNTER — Ambulatory Visit (INDEPENDENT_AMBULATORY_CARE_PROVIDER_SITE_OTHER): Payer: Medicare Other | Admitting: Internal Medicine

## 2018-04-10 ENCOUNTER — Ambulatory Visit (INDEPENDENT_AMBULATORY_CARE_PROVIDER_SITE_OTHER): Payer: Medicare Other | Admitting: Internal Medicine

## 2018-04-10 ENCOUNTER — Other Ambulatory Visit: Payer: Self-pay

## 2018-04-10 ENCOUNTER — Encounter (INDEPENDENT_AMBULATORY_CARE_PROVIDER_SITE_OTHER): Payer: Self-pay | Admitting: Internal Medicine

## 2018-04-10 VITALS — BP 128/62 | HR 72 | Temp 97.6°F | Ht 67.0 in | Wt 127.2 lb

## 2018-04-10 DIAGNOSIS — K922 Gastrointestinal hemorrhage, unspecified: Secondary | ICD-10-CM | POA: Diagnosis not present

## 2018-04-10 LAB — HEMOGLOBIN AND HEMATOCRIT, BLOOD
HCT: 36.7 % — ABNORMAL LOW (ref 38.5–50.0)
HEMOGLOBIN: 12.6 g/dL — AB (ref 13.2–17.1)

## 2018-04-10 NOTE — Progress Notes (Signed)
Subjective:    Patient ID: [name], male    DOB: May 26, 1946, 72 y.o.   MRN: [id number]  HPI Here today for f/u. Last seen in January of this year. Hx of UGIB Underwent 3 EGDs in April for acute hemorrhagic anemia. Had drop in hemoglobin and was transfused.  He was transferred to [facility] on 12/21/2016 after 2nd EGD revealed a large visible vessel in the gastric ulcer base. The ulcer was oozing and there was red blood and clots in the stomach. The ulcer base was injected with epinephrine. He was transferred to [facility] in the  event IR embolization became necessary. IR saw patient and patient was not actively bleeding and there was no plan for intervention at that time.  He tells me he is doing good. He continues to drink beer about 1 can every other day. Appetite is okay.  Lost 2 pounds since his last visit. Has a BM x 1 a day. No melena or BRRB. Denies any BC powders or other NSAIDS.  Hemoglobin 10/07/2017 13.1.  CBC    Component Value Date/Time   WBC 5.2 10/07/2017 1412   RBC 4.40 10/07/2017 1412   HGB 13.1 (L) 10/07/2017 1412   HCT 38.2 (L) 10/07/2017 1412   PLT 143 10/07/2017 1412   MCV 86.8 10/07/2017 1412   MCH 29.8 10/07/2017 1412   MCHC 34.3 10/07/2017 1412   RDW 13.1 10/07/2017 1412   LYMPHSABS 2,438 04/03/2017 1542   MONOABS 424 04/03/2017 1542   EOSABS 265 04/03/2017 1542   BASOSABS 53 04/03/2017 1542      12/22/2016 EGD: Impression: - Normal esophagus. - Non-obstructing non-bleeding gastric ulcer with a  visible vessel. NSAID induced etiology. Treated  with bipolar cautery. Clip (MR conditional) was  placed adjacent to the ulcer for location marking  to facilitate visulization on angio or CTA. - Normal examined duodenum. - No specimens collected.  12/21/2016 EGD melena,  hematemesis OBSCURED BY CLOT. Impression: - HYPOTENSION/HEMATEMESIS/MELENA DUE TO LARGE  gastric ulcer with a visible vessel. Injected.    12/19/2016 EGD - Z-line irregular, 38 cm from the incisors. - 2 cm hiatal hernia. - Granular and nodular mucosa in the gastric fundus  and gastric body. Biopsied. - Non-bleeding gastric ulcer with pigmented  material. Biopsied. - Normal duodenal bulb and second portion of       Review of Systems Past Medical History:  Diagnosis Date  . Arteriosclerotic cardiovascular disease (ASCVD)    IMI in 5/07 with CTO mid cx, 60% LAD, 99% RCA-> BMS; mod. impaired LV function; EF:35-40% 2/10  . Chronic kidney disease    STAGE 3-4-creatinine 2.5 in 2009; 1.7 in 10/2008  . Cirrhosis ([place])    History of excessive alcohol use; continuing social use  . Gastroesophageal reflux disease   . Hyperlipidemia   . Tobacco abuse    60 pack years; one pack per day    Past Surgical History:  Procedure Laterality Date  . ESOPHAGOGASTRODUODENOSCOPY N/A 12/19/2016   Procedure: ESOPHAGOGASTRODUODENOSCOPY (EGD);  Surgeon: Rogene Houston, MD;  [facility];  Service: Endoscopy;  Laterality: N/A;  . ESOPHAGOGASTRODUODENOSCOPY N/A 12/21/2016   Procedure: ESOPHAGOGASTRODUODENOSCOPY (EGD);  Surgeon: Danie Binder, MD;  [facility];  Service: Endoscopy;  Laterality: N/A;  . ESOPHAGOGASTRODUODENOSCOPY (EGD) WITH PROPOFOL N/A 12/22/2016   Procedure: ESOPHAGOGASTRODUODENOSCOPY (EGD) WITH PROPOFOL;  Surgeon: Mauri Pole, MD;  Location: [place] ENDOSCOPY;  Service: Endoscopy;  Laterality: N/A;    No Known Allergies  Current Outpatient Medications  on File Prior to Visit  Medication Sig Dispense Refill  . atorvastatin (LIPITOR) 40  MG tablet     . bisacodyl (DULCOLAX) 10 MG suppository Place 1 suppository (10 mg total) rectally daily as needed for moderate constipation. 12 suppository 0  . carvedilol (COREG) 12.5 MG tablet TAKE 1 TABLET BY MOUTH TWICE DAILY. 60 tablet 0  . castor oil liquid Take 10 mLs by mouth daily as needed for moderate constipation.    . docusate sodium (COLACE) 100 MG capsule Take 1 capsule (100 mg total) by mouth 2 (two) times daily. (Patient taking differently: Take 100 mg by mouth at bedtime. ) 10 capsule 0  . feeding supplement, ENSURE ENLIVE, (ENSURE ENLIVE) LIQD Take 237 mLs by mouth 2 (two) times daily between meals. (Patient taking differently: Take 237 mLs by mouth as needed. ) 537 mL 12  . folic acid (FOLVITE) 1 MG tablet Take 1 tablet (1 mg total) by mouth daily. 30 tablet 3  . furosemide (LASIX) 40 MG tablet     . lisinopril (PRINIVIL,ZESTRIL) 10 MG tablet     . magnesium oxide (MAG-OX) 400 (241.3 Mg) MG tablet Take 1 tablet (400 mg total) by mouth daily. 30 tablet 0  . Multiple Vitamin (DAILY VITE) TABS TAKE ONE TABLET BY MOUTH ONCE DAILY. 90 tablet 1  . nitroGLYCERIN (NITROSTAT) 0.4 MG SL tablet Place 0.4 mg under the tongue as needed.      . pantoprazole (PROTONIX) 40 MG tablet Take 1 tablet (40 mg total) by mouth 2 (two) times daily. 60 tablet 1  . colchicine 0.6 MG tablet Take 1 tablet (0.6 mg total) by mouth 2 (two) times daily. 14 tablet 0  . thiamine 100 MG tablet Take 1 tablet (100 mg total) by mouth daily. 30 tablet 2   No current facility-administered medications on file prior to visit.         Objective:   Physical Exam Blood pressure 128/62, pulse 72, temperature 97.6 F (36.4 C), height 5\' 7"  (1.702 m), weight 127 lb 3.2 oz (57.7 kg). Alert and oriented. Skin warm and dry. Oral mucosa is moist.   . Sclera anicteric, conjunctivae is pink. Thyroid not enlarged. No cervical lymphadenopathy. Lungs clear. Heart regular rate and rhythm.  Abdomen is soft. Bowel sounds are  positive. No hepatomegaly. No abdominal masses felt. No tenderness.  No edema to lower extremities.           Assessment & Plan:  UGI bleed. Will get an H and H.  OV in 1 year.

## 2018-04-10 NOTE — Patient Instructions (Signed)
H and H today 

## 2018-04-11 ENCOUNTER — Other Ambulatory Visit (INDEPENDENT_AMBULATORY_CARE_PROVIDER_SITE_OTHER): Payer: Self-pay | Admitting: *Deleted

## 2018-04-11 DIAGNOSIS — K922 Gastrointestinal hemorrhage, unspecified: Secondary | ICD-10-CM

## 2018-04-18 ENCOUNTER — Other Ambulatory Visit: Payer: Self-pay | Admitting: *Deleted

## 2018-04-18 NOTE — Patient Outreach (Signed)
[place] [facility]) Care Management  04/18/2018  [name] 01-15-1946 [id number]   Referral Date: [id number] Referral Source:  EMMI campaign Referral Reason: Engagement score of 8 Insurance: [employer] attempted #1  follow up outreach call to patient.  Patient was unavailable. HIPPA compliance voicemail message left with return callback number.  Plan: RN will call patient again within 3-5  days.  [place] Care Management [phone]

## 2018-04-18 NOTE — Patient Outreach (Signed)
[place] [facility]) Care Management  04/18/2018  HARJOT DIBELLO 1946-03-25 [id number]   RN Health Coach received telephone call from patient sister. Sister is the caregiver for patient. . Hipaa compliance verified. RN Health Coach described the services available from [facility]. Per Blanch Media (sister) she takes the patient to his Dr appointments. He is able to afford his medications at this time. Per Blanch Media he does not need any Education officer, museum services at this time. Sister agreed that it was okay to send the information and she would contact [facility] if patient has any needs. [facility] services declined at this time.   Plan: RN will send Unsuccessful outreach letter [place] and 24 hr nurse advise line [facility] [phone]

## 2018-04-23 ENCOUNTER — Encounter: Payer: Self-pay | Admitting: *Deleted

## 2018-04-23 NOTE — Telephone Encounter (Signed)
This encounter was created in error - please disregard.

## 2018-05-07 IMAGING — CR DG CHEST 1V PORT
1 series · 1 of 1 positions shown · non-contrast
Comparison: Chest radiograph performed 04/06/2013

CLINICAL DATA: Acute onset of unstable gait.  Initial encounter.

EXAM:
PORTABLE CHEST 1 VIEW

[ap portable]
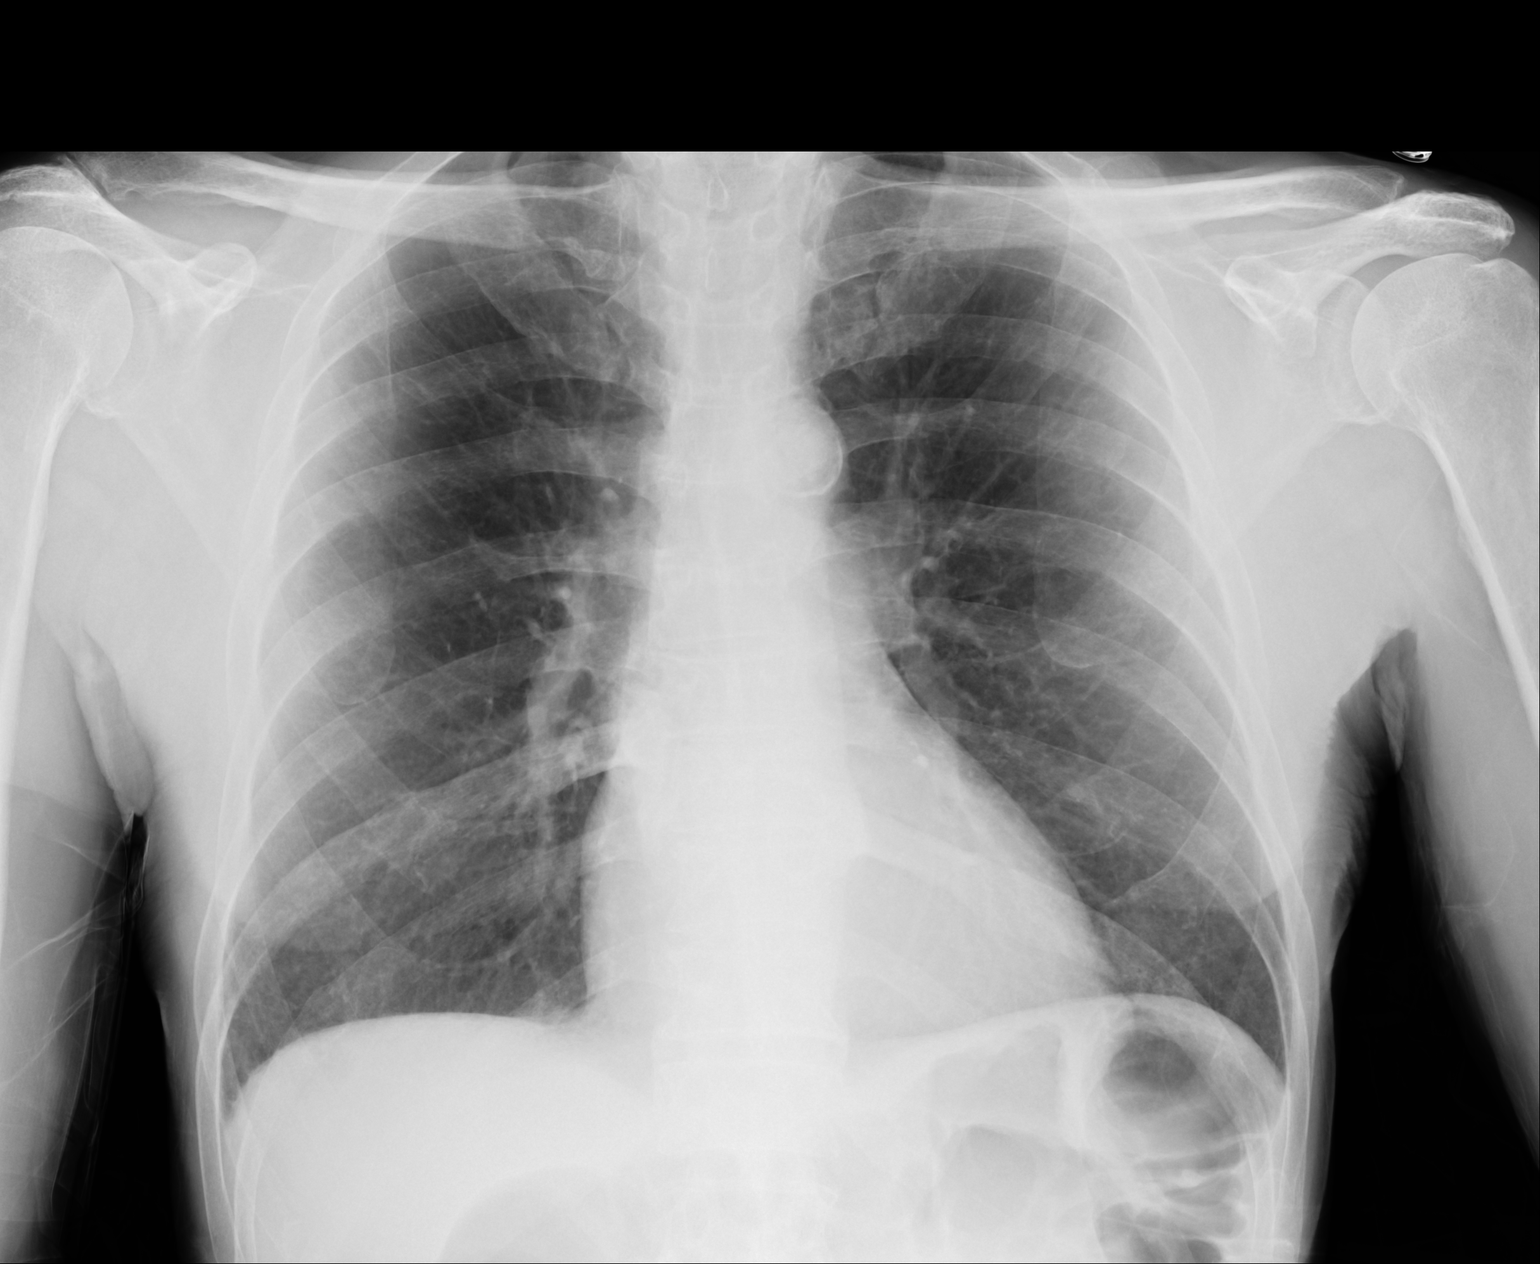

[1 of 1 positions shown; findings below may reference images not displayed]

FINDINGS: The lungs are well-aerated and clear. There is no evidence of focal
opacification, pleural effusion or pneumothorax.

The cardiomediastinal silhouette is within normal limits. No acute
osseous abnormalities are seen.
IMPRESSION: No acute cardiopulmonary process seen.

## 2018-05-12 IMAGING — CT CT HEAD W/O CM
3 series · 16 of 45 positions shown, 19 images · non-contrast
Comparison: None.

CLINICAL DATA: Weakness.  No reported injury.

EXAM:
CT HEAD WITHOUT CONTRAST
TECHNIQUE: Contiguous axial images were obtained from the base of the skull
through the vertex without intravenous contrast.

[Series 2: head wo · axial · 0.41mm/px · z∈[+8,+123]mm · 10 of 28 slices shown, 13 images]
[im 3/28  brain]
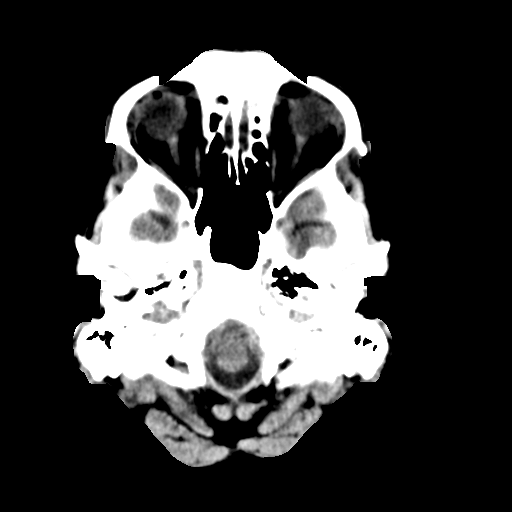
[im 3/28  bone]
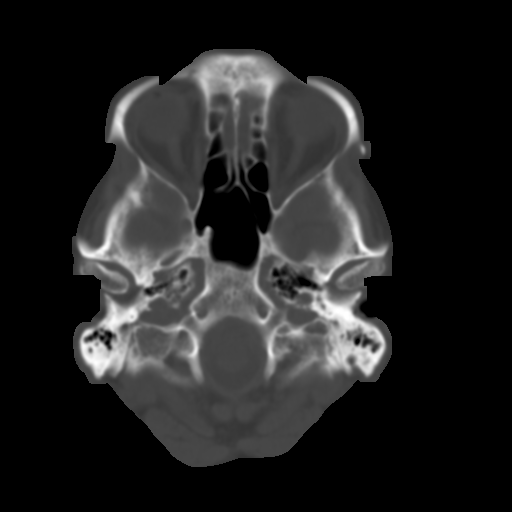
[im 5/28  brain]
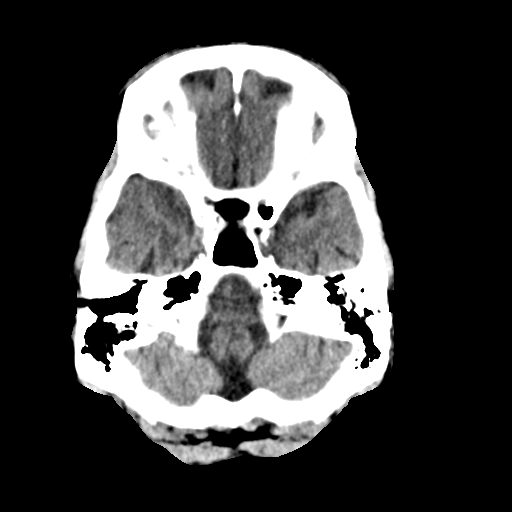
[im 8/28  brain]
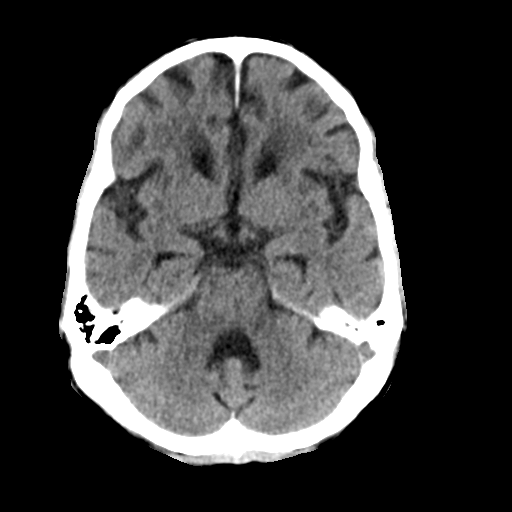
[im 11/28  brain]
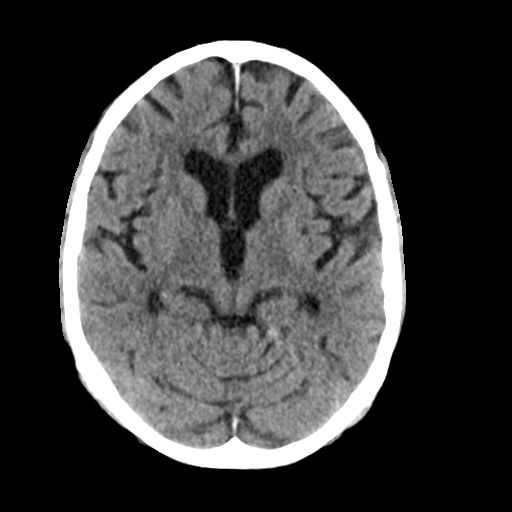
[im 13/28  brain]
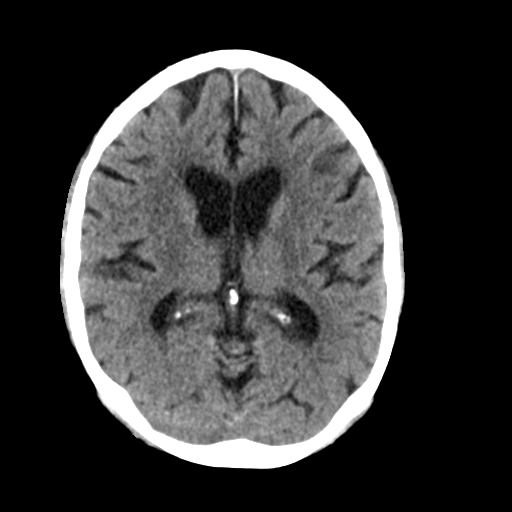
[im 13/28  bone]
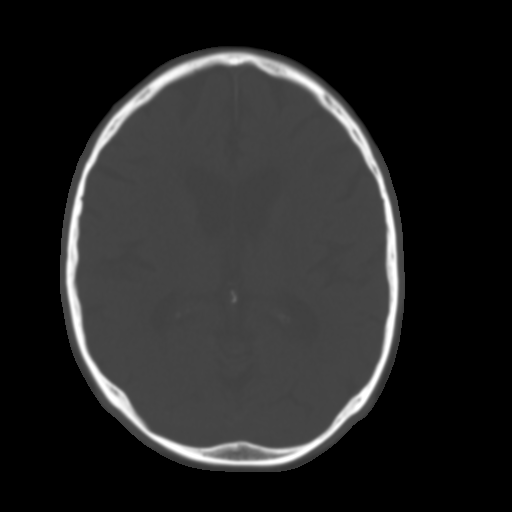
[im 16/28  brain]
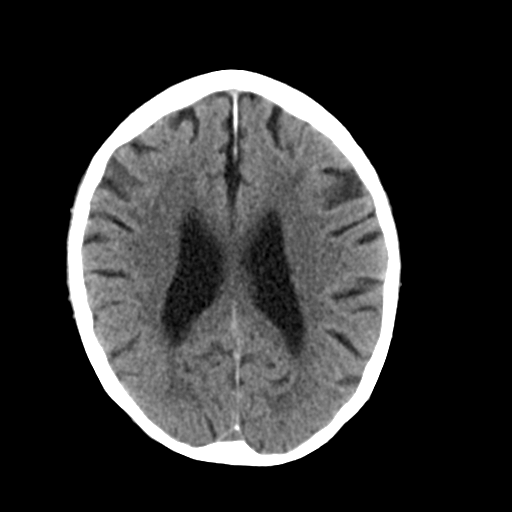
[im 18/28  brain]
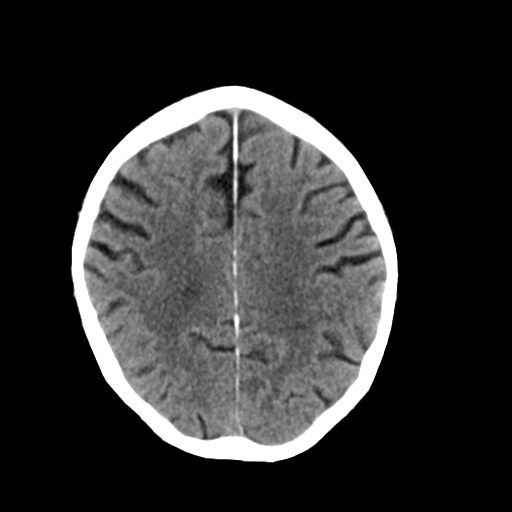
[im 21/28  brain]
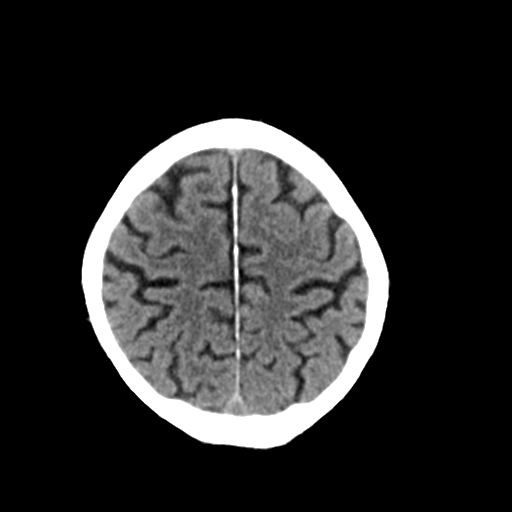
[im 24/28  brain]
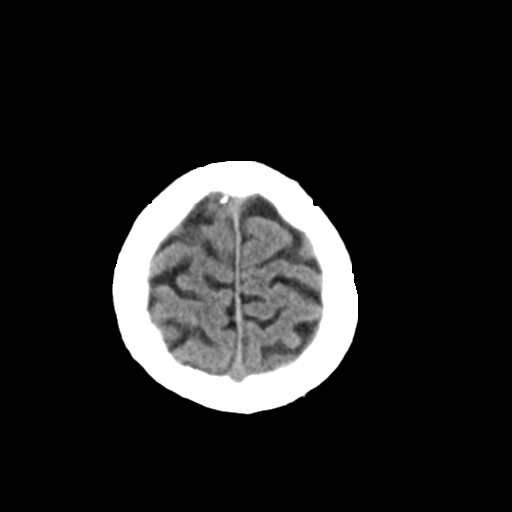
[im 24/28  bone]
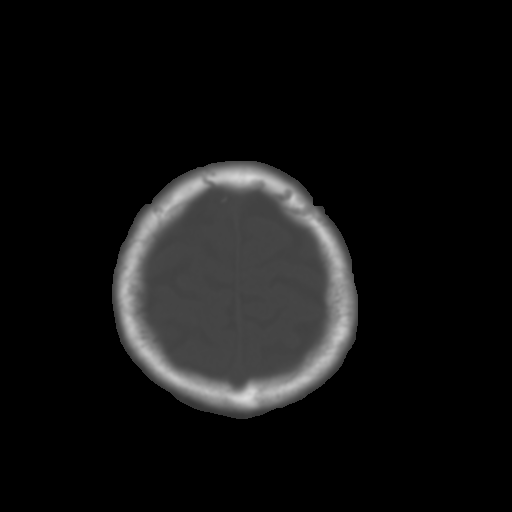
[im 26/28  brain]
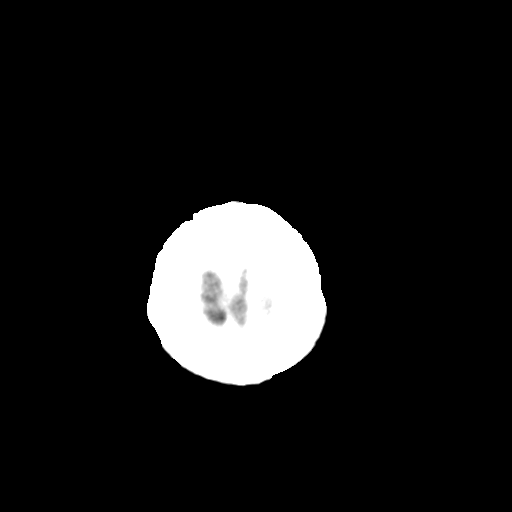

[Series 4: coronal soft tissue · coronal · 0.30mm/px · 3 of 67 slices shown]
[im 23/67  brain]
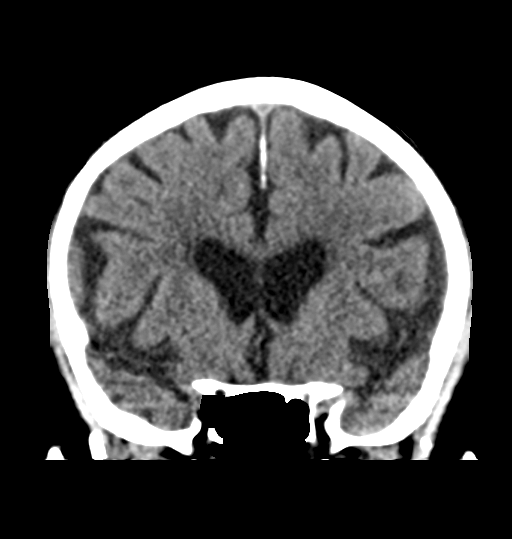
[im 30/67  brain]
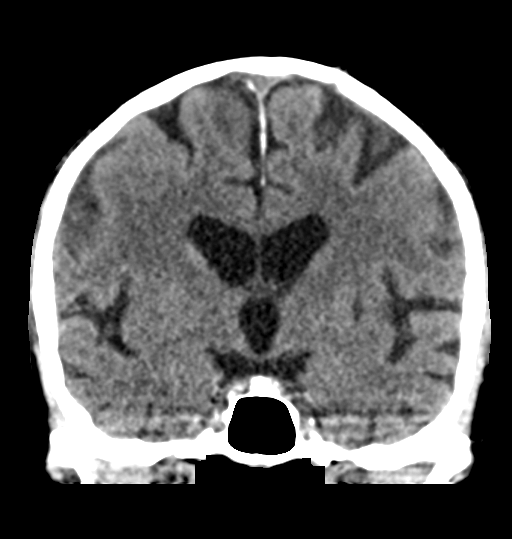
[im 37/67  brain]
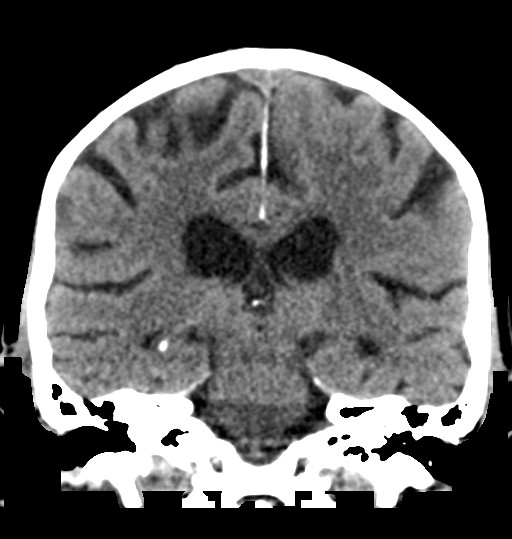

[Series 5: sagittal soft tissue · sagittal · 0.33mm/px · 3 of 51 slices shown]
[im 17/51  brain]
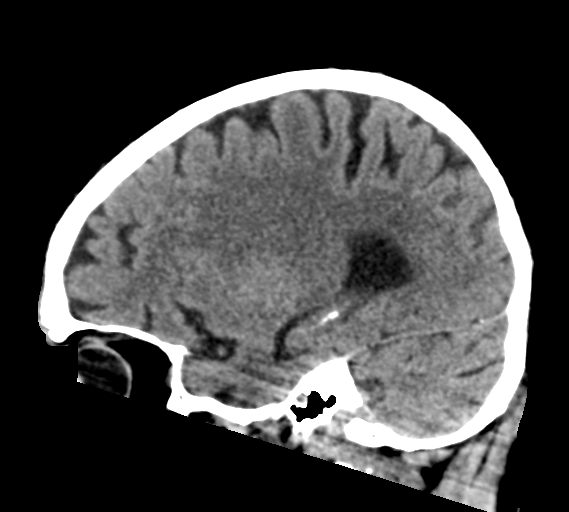
[im 26/51  brain]
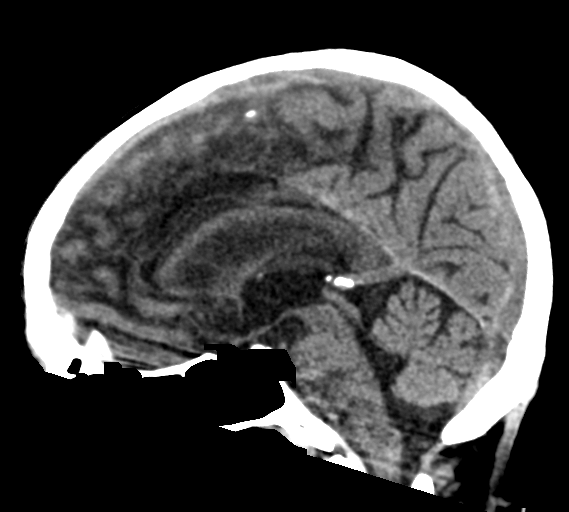
[im 34/51  brain]
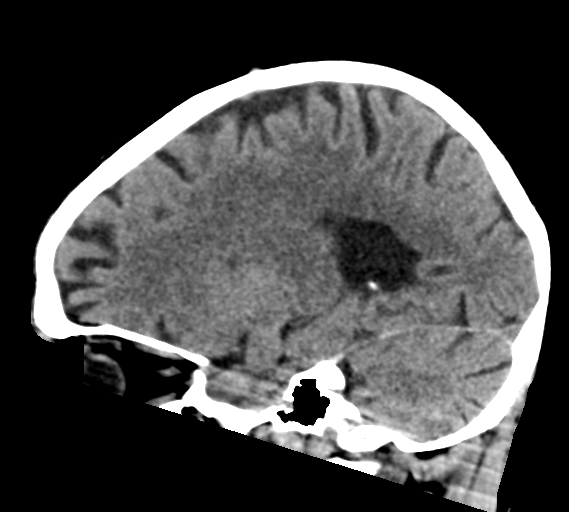

[16 of 45 positions shown; findings below may reference images not displayed]

FINDINGS: Brain: No evidence of parenchymal hemorrhage or extra-axial fluid
collection. No mass lesion, mass effect, or midline shift. No CT
evidence of acute infarction. Nonspecific mild subcortical and
periventricular white matter hypodensity, most in keeping with
chronic small vessel ischemic change. Cerebral volume is age
appropriate. No ventriculomegaly.

Vascular: Intracranial atherosclerosis.  No acute abnormality

Skull: No evidence of calvarial fracture.

Sinuses/Orbits: The visualized paranasal sinuses are essentially
clear.

Other:  The mastoid air cells are unopacified.
IMPRESSION: 1.  No evidence of acute intracranial abnormality.
2. Mild chronic small vessel ischemia.

## 2018-06-11 ENCOUNTER — Other Ambulatory Visit (INDEPENDENT_AMBULATORY_CARE_PROVIDER_SITE_OTHER): Payer: Self-pay | Admitting: *Deleted

## 2018-06-11 ENCOUNTER — Encounter (INDEPENDENT_AMBULATORY_CARE_PROVIDER_SITE_OTHER): Payer: Self-pay | Admitting: *Deleted

## 2018-06-11 DIAGNOSIS — K922 Gastrointestinal hemorrhage, unspecified: Secondary | ICD-10-CM

## 2018-09-30 DIAGNOSIS — J449 Chronic obstructive pulmonary disease, unspecified: Secondary | ICD-10-CM | POA: Diagnosis not present

## 2018-09-30 DIAGNOSIS — I10 Essential (primary) hypertension: Secondary | ICD-10-CM | POA: Diagnosis not present

## 2018-09-30 DIAGNOSIS — K7460 Unspecified cirrhosis of liver: Secondary | ICD-10-CM | POA: Diagnosis not present

## 2018-09-30 DIAGNOSIS — Z23 Encounter for immunization: Secondary | ICD-10-CM | POA: Diagnosis not present

## 2018-10-01 DIAGNOSIS — K7460 Unspecified cirrhosis of liver: Secondary | ICD-10-CM | POA: Diagnosis not present

## 2018-10-01 DIAGNOSIS — J449 Chronic obstructive pulmonary disease, unspecified: Secondary | ICD-10-CM | POA: Diagnosis not present

## 2018-10-01 DIAGNOSIS — I10 Essential (primary) hypertension: Secondary | ICD-10-CM | POA: Diagnosis not present

## 2018-10-20 ENCOUNTER — Other Ambulatory Visit (HOSPITAL_COMMUNITY): Payer: Self-pay | Admitting: Pulmonary Disease

## 2018-10-20 ENCOUNTER — Other Ambulatory Visit: Payer: Self-pay | Admitting: Pulmonary Disease

## 2018-10-20 DIAGNOSIS — R634 Abnormal weight loss: Secondary | ICD-10-CM

## 2018-11-03 ENCOUNTER — Other Ambulatory Visit (HOSPITAL_COMMUNITY): Payer: Self-pay | Admitting: Pulmonary Disease

## 2018-11-03 DIAGNOSIS — R634 Abnormal weight loss: Secondary | ICD-10-CM

## 2018-11-05 ENCOUNTER — Ambulatory Visit (HOSPITAL_COMMUNITY)
Admission: RE | Admit: 2018-11-05 | Discharge: 2018-11-05 | Disposition: A | Discharge status: Home / Self Care | Payer: Medicare Other | Source: Ambulatory Visit | Attending: Pulmonary Disease | Admitting: Pulmonary Disease

## 2018-11-05 DIAGNOSIS — N261 Atrophy of kidney (terminal): Secondary | ICD-10-CM | POA: Insufficient documentation

## 2018-11-05 DIAGNOSIS — I700 Atherosclerosis of aorta: Secondary | ICD-10-CM | POA: Diagnosis not present

## 2018-11-05 DIAGNOSIS — R634 Abnormal weight loss: Secondary | ICD-10-CM | POA: Insufficient documentation

## 2018-11-05 DIAGNOSIS — N401 Enlarged prostate with lower urinary tract symptoms: Secondary | ICD-10-CM | POA: Insufficient documentation

## 2018-11-05 DIAGNOSIS — I2510 Atherosclerotic heart disease of native coronary artery without angina pectoris: Secondary | ICD-10-CM | POA: Insufficient documentation

## 2018-11-05 LAB — POCT I-STAT CREATININE: CREATININE: 2 mg/dL — AB (ref 0.61–1.24)

## 2018-11-05 MED ORDER — IOHEXOL 300 MG/ML  SOLN
100.0000 mL | Freq: Once | INTRAMUSCULAR | Status: AC | PRN
  Administered 2018-11-05: 75 mL via INTRAVENOUS

## 2018-11-11 DIAGNOSIS — J449 Chronic obstructive pulmonary disease, unspecified: Secondary | ICD-10-CM | POA: Diagnosis not present

## 2018-11-11 DIAGNOSIS — I10 Essential (primary) hypertension: Secondary | ICD-10-CM | POA: Diagnosis not present

## 2018-11-11 DIAGNOSIS — K7460 Unspecified cirrhosis of liver: Secondary | ICD-10-CM | POA: Diagnosis not present

## 2018-11-20 ENCOUNTER — Ambulatory Visit (INDEPENDENT_AMBULATORY_CARE_PROVIDER_SITE_OTHER): Payer: Medicare Other | Admitting: Internal Medicine

## 2018-11-20 ENCOUNTER — Telehealth (INDEPENDENT_AMBULATORY_CARE_PROVIDER_SITE_OTHER): Payer: Self-pay | Admitting: Internal Medicine

## 2018-11-20 ENCOUNTER — Encounter (INDEPENDENT_AMBULATORY_CARE_PROVIDER_SITE_OTHER): Payer: Self-pay | Admitting: Internal Medicine

## 2018-11-20 VITALS — BP 95/57 | HR 68 | Temp 97.7°F | Ht 67.0 in | Wt 122.5 lb

## 2018-11-20 DIAGNOSIS — K922 Gastrointestinal hemorrhage, unspecified: Secondary | ICD-10-CM | POA: Diagnosis not present

## 2018-11-20 LAB — HEMOGLOBIN AND HEMATOCRIT, BLOOD
HCT: 29.8 % — ABNORMAL LOW (ref 38.5–50.0)
Hemoglobin: 10.3 g/dL — ABNORMAL LOW (ref 13.2–17.1)

## 2018-11-20 NOTE — Progress Notes (Addendum)
Subjective:    Patient ID: [name], male    DOB: [birth year], 73 y.o.   MRN: [id number]  HPI Here today for f/u. Last seen in July of last year. He tells me he is doing okay. Appetite is not good. He has lost from 125 to 122.5. Says he not feed what he wants to eat.  He is not taking any BC powders. Wife states he continues to drink wine and beer daily.  He is having a BM daily. No GERD.  No melena or BRRB.  Hx of cirrhosis.   Underwent 3 EGDs in April 2018 for acute hemorrhagic anemia. Had drop in hemoglobin and was transfused.  He was transferred to [facility] on 12/21/2016 after 2nd EGD revealed a large visible vessel in the gastric ulcer base. The ulcer was oozing and there was red blood and clots in the stomach. The ulcer base was injected with epinephrine. He was transferred to [facility] in the  event IR embolization became necessary. IR saw patient and patient was not actively bleeding and there was no plan for intervention at that time.    12/22/2016 EGD: Impression: - Normal esophagus. - Non-obstructing non-bleeding gastric ulcer with a  visible vessel. NSAID induced etiology. Treated  with bipolar cautery. Clip (MR conditional) was  placed adjacent to the ulcer for location marking  to facilitate visulization on angio or CTA. - Normal examined duodenum. - No specimens collected.  12/21/2016 EGD melena, hematemesis OBSCURED BY CLOT. Impression: - HYPOTENSION/HEMATEMESIS/MELENA DUE TO LARGE  gastric ulcer with a visible vessel. Injected.    12/19/2016 EGD - Z-line irregular, 38 cm from the incisors. - 2 cm hiatal hernia. - Granular and nodular mucosa in the gastric  fundus  and gastric body. Biopsied. - Non-bleeding gastric ulcer with pigmented  material. Biopsied. - Normal duodenal bulb and second portion of      Review of Systems Past Medical History:  Diagnosis Date  . Arteriosclerotic cardiovascular disease (ASCVD)    IMI in 5/07 with CTO mid cx, 60% LAD, 99% RCA-> BMS; mod. impaired LV function; EF:35-40% 2/10  . Chronic kidney disease    STAGE 3-4-creatinine 2.5 in 2009; 1.7 in 10/2008  . Cirrhosis ([place])    History of excessive alcohol use; continuing social use  . Gastroesophageal reflux disease   . Hyperlipidemia   . Tobacco abuse    60 pack years; one pack per day    Past Surgical History:  Procedure Laterality Date  . ESOPHAGOGASTRODUODENOSCOPY N/A 12/19/2016   Procedure: ESOPHAGOGASTRODUODENOSCOPY (EGD);  Surgeon: Rogene Houston, MD;  [facility];  Service: Endoscopy;  Laterality: N/A;  . ESOPHAGOGASTRODUODENOSCOPY N/A 12/21/2016   Procedure: ESOPHAGOGASTRODUODENOSCOPY (EGD);  Surgeon: Danie Binder, MD;  [facility];  Service: Endoscopy;  Laterality: N/A;  . ESOPHAGOGASTRODUODENOSCOPY (EGD) WITH PROPOFOL N/A 12/22/2016   Procedure: ESOPHAGOGASTRODUODENOSCOPY (EGD) WITH PROPOFOL;  Surgeon: Mauri Pole, MD;  Location: [place] ENDOSCOPY;  Service: Endoscopy;  Laterality: N/A;    No Known Allergies  Current Outpatient Medications on File Prior to Visit  Medication Sig Dispense Refill  . atorvastatin (LIPITOR) 40 MG tablet     . carvedilol (COREG) 12.5 MG tablet TAKE 1 TABLET BY MOUTH TWICE DAILY. 60 tablet 0  . furosemide (LASIX) 40 MG tablet     . magnesium oxide (MAG-OX) 400 (241.3 Mg) MG tablet Take 1 tablet (400 mg total) by mouth daily. 30 tablet 0  . nitroGLYCERIN (NITROSTAT) 0.4 MG SL tablet Place 0.4 mg  under the tongue as needed.      . pantoprazole (PROTONIX) 40 MG tablet Take 1 tablet (40 mg  total) by mouth 2 (two) times daily. 60 tablet 1  . thiamine 100 MG tablet Take 1 tablet (100 mg total) by mouth daily. 30 tablet 2  . colchicine 0.6 MG tablet Take 1 tablet (0.6 mg total) by mouth 2 (two) times daily. 14 tablet 0   No current facility-administered medications on file prior to visit.         Objective:   Physical Exam Blood pressure (!) 95/57, pulse 68, temperature 97.7 F (36.5 C), height 5\' 7"  (1.702 m), weight 122 lb 8 oz (55.6 kg). Alert and oriented. Skin warm and dry. Oral mucosa is moist.   . Sclera anicteric, conjunctivae is pink. Thyroid not enlarged. No cervical lymphadenopathy. Lungs clear. Heart regular rate and rhythm.  Abdomen is soft. Bowel sounds are positive. No hepatomegaly. No abdominal masses felt. No tenderness.  No edema to lower extremities.           Assessment & Plan:  PUD/ UGIB. Doing well. Will get an H and H today. Will discuss with Dr. Laural Golden. ( discussed with Dr. Laural Golden). Needs EGD with propofol to document healing.

## 2018-11-20 NOTE — Patient Instructions (Signed)
H and H today 

## 2018-11-20 NOTE — Telephone Encounter (Signed)
Ann, EGD with propofol. To document healed ulcer.

## 2018-11-21 ENCOUNTER — Encounter (INDEPENDENT_AMBULATORY_CARE_PROVIDER_SITE_OTHER): Payer: Self-pay | Admitting: *Deleted

## 2018-11-21 ENCOUNTER — Other Ambulatory Visit (INDEPENDENT_AMBULATORY_CARE_PROVIDER_SITE_OTHER): Payer: Self-pay | Admitting: Internal Medicine

## 2018-11-21 DIAGNOSIS — K259 Gastric ulcer, unspecified as acute or chronic, without hemorrhage or perforation: Secondary | ICD-10-CM

## 2018-11-21 NOTE — Telephone Encounter (Signed)
EGD sch'd 12/05/18, patient aware, instructions mailed

## 2018-11-26 NOTE — Patient Instructions (Signed)
31    Your procedure is scheduled on: 12/05/2018  Report to [facility] at   12:00  PM.  Call this number if you have problems the morning of surgery: [phone]   Remember:   Do not drink or eat food:After Midnight.      Take these medicines the morning of surgery with A SIP OF WATER: Coreg and Protonix   Do not wear jewelry, make-up or nail polish.  Do not wear lotions, powders, or perfumes. You may wear deodorant.                Do not bring valuables to the hospital.  Contacts, dentures or bridgework may not be worn into surgery.  Leave suitcase in the car. After surgery it may be brought to your room.  For patients admitted to the hospital, checkout time is 11:00 AM the day of discharge.   Patients discharged the day of surgery will not be allowed to drive home.                                                                                                                                    Endoscopy Care After Please read the instructions outlined below and refer to this sheet in the next few weeks. These discharge instructions provide you with general information on caring for yourself after you leave the hospital. Your doctor may also give you specific instructions. While your treatment has been planned according to the most current medical practices available, unavoidable complications occasionally occur. If you have any problems or questions after discharge, please call your doctor. HOME CARE INSTRUCTIONS Activity  You may resume your regular activity but move at a slower pace for the next 24 hours.   Take frequent rest periods for the next 24 hours.   Walking will help expel (get rid of) the air and reduce the bloated feeling in your abdomen.   No driving for 24 hours (because of the anesthesia (medicine) used during the test).   You may shower.   Do not sign any important legal documents or operate any machinery for 24 hours (because of the anesthesia used during the  test).  Nutrition  Drink plenty of fluids.   You may resume your normal diet.   Begin with a light meal and progress to your normal diet.   Avoid alcoholic beverages for 24 hours or as instructed by your caregiver.  Medications You may resume your normal medications unless your caregiver tells you otherwise. What you can expect today  You may experience abdominal discomfort such as a feeling of fullness or "gas" pains.   You may experience a sore throat for 2 to 3 days. This is normal. Gargling with salt water may help this.  Follow-up Your doctor will discuss the results of your test with you. SEEK IMMEDIATE MEDICAL CARE IF:  You have excessive nausea (feeling sick to your stomach) and/or vomiting.  You have severe abdominal pain and distention (swelling).   You have trouble swallowing.   You have a temperature over 100 F (37.8 C).   You have rectal bleeding or vomiting of blood.  Document Released: 04/17/2004 Document Revised: 08/23/2011 Document Reviewed: 10/29/2007

## 2018-12-01 ENCOUNTER — Other Ambulatory Visit: Payer: Self-pay

## 2018-12-01 ENCOUNTER — Encounter (HOSPITAL_COMMUNITY)
Admission: RE | Admit: 2018-12-01 | Discharge: 2018-12-01 | Disposition: A | Discharge status: Home / Self Care | Payer: Medicare Other | Source: Ambulatory Visit | Attending: Internal Medicine | Admitting: Internal Medicine

## 2018-12-01 DIAGNOSIS — K259 Gastric ulcer, unspecified as acute or chronic, without hemorrhage or perforation: Secondary | ICD-10-CM

## 2018-12-01 DIAGNOSIS — Z01812 Encounter for preprocedural laboratory examination: Secondary | ICD-10-CM | POA: Diagnosis not present

## 2018-12-01 LAB — CBC WITH DIFFERENTIAL/PLATELET
Abs Immature Granulocytes: 0.01 10*3/uL (ref 0.00–0.07)
Basophils Absolute: 0 10*3/uL (ref 0.0–0.1)
Basophils Relative: 1 %
Eosinophils Absolute: 0.3 10*3/uL (ref 0.0–0.5)
Eosinophils Relative: 7 %
HEMATOCRIT: 32.2 % — AB (ref 39.0–52.0)
Hemoglobin: 10.5 g/dL — ABNORMAL LOW (ref 13.0–17.0)
Immature Granulocytes: 0 %
LYMPHS ABS: 1.8 10*3/uL (ref 0.7–4.0)
Lymphocytes Relative: 39 %
MCH: 30.6 pg (ref 26.0–34.0)
MCHC: 32.6 g/dL (ref 30.0–36.0)
MCV: 93.9 fL (ref 80.0–100.0)
Monocytes Absolute: 0.3 10*3/uL (ref 0.1–1.0)
Monocytes Relative: 7 %
Neutro Abs: 2.1 10*3/uL (ref 1.7–7.7)
Neutrophils Relative %: 46 %
Platelets: 141 10*3/uL — ABNORMAL LOW (ref 150–400)
RBC: 3.43 MIL/uL — ABNORMAL LOW (ref 4.22–5.81)
RDW: 13 % (ref 11.5–15.5)
WBC: 4.6 10*3/uL (ref 4.0–10.5)
nRBC: 0 % (ref 0.0–0.2)

## 2018-12-01 LAB — BASIC METABOLIC PANEL
Anion gap: 7 (ref 5–15)
BUN: 29 mg/dL — ABNORMAL HIGH (ref 8–23)
CO2: 24 mmol/L (ref 22–32)
Calcium: 8.6 mg/dL — ABNORMAL LOW (ref 8.9–10.3)
Chloride: 105 mmol/L (ref 98–111)
Creatinine, Ser: 2.12 mg/dL — ABNORMAL HIGH (ref 0.61–1.24)
GFR calc Af Amer: 35 mL/min — ABNORMAL LOW (ref >60)
GFR calc non Af Amer: 30 mL/min — ABNORMAL LOW (ref >60)
Glucose, Bld: 101 mg/dL — ABNORMAL HIGH (ref 70–99)
Potassium: 4 mmol/L (ref 3.5–5.1)
Sodium: 136 mmol/L (ref 135–145)

## 2018-12-05 ENCOUNTER — Encounter (HOSPITAL_COMMUNITY): Payer: Self-pay

## 2018-12-05 ENCOUNTER — Encounter (HOSPITAL_COMMUNITY)
Admission: RE | Disposition: A | Discharge status: Home / Self Care | Payer: Self-pay | Source: Home / Self Care | Attending: Internal Medicine

## 2018-12-05 ENCOUNTER — Ambulatory Visit (HOSPITAL_COMMUNITY)
Admission: RE | Admit: 2018-12-05 | Discharge: 2018-12-05 | Disposition: A | Discharge status: Home / Self Care | Payer: Medicare Other | Attending: Internal Medicine | Admitting: Internal Medicine

## 2018-12-05 ENCOUNTER — Other Ambulatory Visit: Payer: Self-pay

## 2018-12-05 ENCOUNTER — Ambulatory Visit (HOSPITAL_COMMUNITY): Payer: Medicare Other | Admitting: Anesthesiology

## 2018-12-05 DIAGNOSIS — K279 Peptic ulcer, site unspecified, unspecified as acute or chronic, without hemorrhage or perforation: Secondary | ICD-10-CM | POA: Diagnosis not present

## 2018-12-05 DIAGNOSIS — K449 Diaphragmatic hernia without obstruction or gangrene: Secondary | ICD-10-CM | POA: Insufficient documentation

## 2018-12-05 DIAGNOSIS — F17200 Nicotine dependence, unspecified, uncomplicated: Secondary | ICD-10-CM | POA: Insufficient documentation

## 2018-12-05 DIAGNOSIS — R634 Abnormal weight loss: Secondary | ICD-10-CM | POA: Insufficient documentation

## 2018-12-05 DIAGNOSIS — K219 Gastro-esophageal reflux disease without esophagitis: Secondary | ICD-10-CM | POA: Diagnosis not present

## 2018-12-05 DIAGNOSIS — K2950 Unspecified chronic gastritis without bleeding: Secondary | ICD-10-CM | POA: Diagnosis not present

## 2018-12-05 DIAGNOSIS — R7989 Other specified abnormal findings of blood chemistry: Secondary | ICD-10-CM | POA: Insufficient documentation

## 2018-12-05 DIAGNOSIS — N184 Chronic kidney disease, stage 4 (severe): Secondary | ICD-10-CM | POA: Diagnosis not present

## 2018-12-05 DIAGNOSIS — Z09 Encounter for follow-up examination after completed treatment for conditions other than malignant neoplasm: Secondary | ICD-10-CM | POA: Insufficient documentation

## 2018-12-05 DIAGNOSIS — R6881 Early satiety: Secondary | ICD-10-CM | POA: Diagnosis not present

## 2018-12-05 DIAGNOSIS — K3189 Other diseases of stomach and duodenum: Secondary | ICD-10-CM | POA: Diagnosis not present

## 2018-12-05 DIAGNOSIS — E785 Hyperlipidemia, unspecified: Secondary | ICD-10-CM | POA: Insufficient documentation

## 2018-12-05 DIAGNOSIS — Z79899 Other long term (current) drug therapy: Secondary | ICD-10-CM | POA: Insufficient documentation

## 2018-12-05 DIAGNOSIS — D649 Anemia, unspecified: Secondary | ICD-10-CM | POA: Diagnosis not present

## 2018-12-05 DIAGNOSIS — I2510 Atherosclerotic heart disease of native coronary artery without angina pectoris: Secondary | ICD-10-CM | POA: Insufficient documentation

## 2018-12-05 DIAGNOSIS — K259 Gastric ulcer, unspecified as acute or chronic, without hemorrhage or perforation: Secondary | ICD-10-CM | POA: Insufficient documentation

## 2018-12-05 HISTORY — PX: ESOPHAGOGASTRODUODENOSCOPY (EGD) WITH PROPOFOL: SHX5813

## 2018-12-05 HISTORY — PX: BIOPSY: SHX5522

## 2018-12-05 SURGERY — ESOPHAGOGASTRODUODENOSCOPY (EGD) WITH PROPOFOL
Anesthesia: General

## 2018-12-05 MED ORDER — HYDROMORPHONE HCL 1 MG/ML IJ SOLN: 0.2500 mg | INTRAMUSCULAR | Status: DC | PRN

## 2018-12-05 MED ORDER — PROPOFOL 10 MG/ML IV BOLUS
INTRAVENOUS | Status: DC | PRN
  Administered 2018-12-05 (×2): 20 mg via INTRAVENOUS

## 2018-12-05 MED ORDER — MIDAZOLAM HCL 2 MG/2ML IJ SOLN: 0.5000 mg | Freq: Once | INTRAMUSCULAR | Status: DC | PRN

## 2018-12-05 MED ORDER — PROMETHAZINE HCL 25 MG/ML IJ SOLN: 6.2500 mg | INTRAMUSCULAR | Status: DC | PRN

## 2018-12-05 MED ORDER — PROPOFOL 500 MG/50ML IV EMUL
INTRAVENOUS | Status: DC | PRN
  Administered 2018-12-05: 200 ug/kg/min via INTRAVENOUS

## 2018-12-05 MED ORDER — CHLORHEXIDINE GLUCONATE CLOTH 2 % EX PADS: 6.0000 | MEDICATED_PAD | Freq: Once | CUTANEOUS | Status: DC

## 2018-12-05 MED ORDER — LACTATED RINGERS IV SOLN
INTRAVENOUS | Status: DC
  Administered 2018-12-05: 08:00:00 via INTRAVENOUS

## 2018-12-05 MED ORDER — HYDROCODONE-ACETAMINOPHEN 7.5-325 MG PO TABS: 1.0000 | ORAL_TABLET | Freq: Once | ORAL | Status: DC | PRN

## 2018-12-05 MED ORDER — PANTOPRAZOLE SODIUM 40 MG PO TBEC: 40.0000 mg | DELAYED_RELEASE_TABLET | Freq: Every day | ORAL | 5 refills | Status: DC

## 2018-12-05 NOTE — Anesthesia Procedure Notes (Signed)
Procedure Name: MAC Date/Time: 12/05/2018 8:36 AM Performed by: Vista Deck, CRNA Pre-anesthesia Checklist: Patient identified, Emergency Drugs available, Suction available, Timeout performed and Patient being monitored Patient Re-evaluated:Patient Re-evaluated prior to induction Oxygen Delivery Method: Nasal Cannula

## 2018-12-05 NOTE — Anesthesia Postprocedure Evaluation (Signed)
Anesthesia Post Note  Patient: [name]  Procedure(s) Performed: ESOPHAGOGASTRODUODENOSCOPY (EGD) WITH PROPOFOL (N/A ) BIOPSY  Patient location during evaluation: PACU Anesthesia Type: General Level of consciousness: awake and alert and patient cooperative Pain management: pain level controlled Vital Signs Assessment: post-procedure vital signs reviewed and stable Respiratory status: spontaneous breathing Cardiovascular status: stable Postop Assessment: no apparent nausea or vomiting Anesthetic complications: no     Last Vitals:  Vitals:   12/05/18 0900 12/05/18 0915  BP: (!) 96/46   Pulse: 61   Resp: (!) 24   Temp: (!) 36.3 C 36.7 C  SpO2:      Last Pain:  Vitals:   12/05/18 0915  TempSrc:   PainSc: 0-No pain                 Debar Plate

## 2018-12-05 NOTE — H&P (Signed)
[name] is an 73 y.o. male.   Chief Complaint: Patient is here for EGD. HPI: Patient is 73 year old Afro-American male who presents with early satiety and 10 pound weight loss.  He has a history of gastric ulcer which was diagnosed in April 2018 when he presented with GI bleed.  He was transferred: At that time.  He did not require surgical intervention.  He did receive blood transfusion.  He has not had a follow-up exam.  Pylori status is unknown. His wife states he does not take aspirin or other OTC NSAIDs.  He has not experienced nausea vomiting abdominal pain melena or rectal bleeding.  He drinks beer 1 to 2 cans on some days but not every day. Lab pertinent for anemia and elevated serum creatinine.  Past Medical History:  Diagnosis Date  . Arteriosclerotic cardiovascular disease (ASCVD)    IMI in 5/07 with CTO mid cx, 60% LAD, 99% RCA-> BMS; mod. impaired LV function; EF:35-40% 2/10  . Chronic kidney disease    STAGE 3-4-creatinine 2.5 in 2009; 1.7 in 10/2008  .     History of excessive alcohol use; continuing social use  . Gastroesophageal reflux disease   . Hyperlipidemia   . Tobacco abuse    60 pack years; one pack per day    Past Surgical History:  Procedure Laterality Date  . ESOPHAGOGASTRODUODENOSCOPY N/A 12/19/2016   Procedure: ESOPHAGOGASTRODUODENOSCOPY (EGD);  Surgeon: Rogene Houston, MD;  [facility];  Service: Endoscopy;  Laterality: N/A;  . ESOPHAGOGASTRODUODENOSCOPY N/A 12/21/2016   Procedure: ESOPHAGOGASTRODUODENOSCOPY (EGD);  Surgeon: Danie Binder, MD;  [facility];  Service: Endoscopy;  Laterality: N/A;  . ESOPHAGOGASTRODUODENOSCOPY (EGD) WITH PROPOFOL N/A 12/22/2016   Procedure: ESOPHAGOGASTRODUODENOSCOPY (EGD) WITH PROPOFOL;  Surgeon: Mauri Pole, MD;  Location: [place] ENDOSCOPY;  Service: Endoscopy;  Laterality: N/A;    Family History  Problem Relation Age of Onset  . Other Brother    Social History:  reports that he has been  smoking. He has a 60.00 pack-year smoking history. He has never used smokeless tobacco. He reports current alcohol use. He reports that he does not use drugs.  Allergies: No Known Allergies  Medications Prior to Admission  Medication Sig Dispense Refill  . atorvastatin (LIPITOR) 40 MG tablet Take 40 mg by mouth daily at 6 PM.     . carvedilol (COREG) 12.5 MG tablet TAKE 1 TABLET BY MOUTH TWICE DAILY. (Patient taking differently: Take 12.5 mg by mouth 2 (two) times daily with a meal. ) 60 tablet 0  . colchicine 0.6 MG tablet Take 1 tablet (0.6 mg total) by mouth 2 (two) times daily. 14 tablet 0  . furosemide (LASIX) 40 MG tablet Take 40 mg by mouth daily.     . magnesium oxide (MAG-OX) 400 (241.3 Mg) MG tablet Take 1 tablet (400 mg total) by mouth daily. 30 tablet 0  . pantoprazole (PROTONIX) 40 MG tablet Take 1 tablet (40 mg total) by mouth 2 (two) times daily. 60 tablet 1  . thiamine 100 MG tablet Take 1 tablet (100 mg total) by mouth daily. 30 tablet 2  . nitroGLYCERIN (NITROSTAT) 0.4 MG SL tablet Place 0.4 mg under the tongue as needed.        No results found for this or any previous visit (from the past 48 hour(s)). No results found.  ROS  Blood pressure 134/63, pulse 63, temperature (!) 97.5 F (36.4 C), temperature source Oral, resp. rate (!) 25, weight 54.9  kg, SpO2 100 %. Physical Exam  Constitutional:  Well-developed thin African-American male in NAD.  HENT:  Mouth/Throat: Oropharynx is clear and moist.  Eyes: Conjunctivae are normal. No scleral icterus.  Neck: No thyromegaly present.  Cardiovascular: Normal rate, regular rhythm and normal heart sounds.  No murmur heard. Respiratory: Effort normal and breath sounds normal.  GI:  [name] is flat but soft and nontender with organomegaly or masses.  Musculoskeletal:        General: No edema.  Neurological: He is alert.  Skin: Skin is warm and dry.  Has geographic hypopigmented rash over upper chest.      Assessment/Plan Early satiety and weight loss. History of gastric ulcer. Diagnostic EGD.  Hildred Laser, MD 12/05/2018, 8:31 AM

## 2018-12-05 NOTE — Transfer of Care (Signed)
Immediate Anesthesia Transfer of Care Note  Patient: [name]  Procedure(s) Performed: ESOPHAGOGASTRODUODENOSCOPY (EGD) WITH PROPOFOL (N/A ) BIOPSY  Patient Location: PACU  Anesthesia Type:General  Level of Consciousness: awake and patient cooperative  Airway & Oxygen Therapy: Patient Spontanous Breathing and Patient connected to nasal cannula oxygen  Post-op Assessment: Report given to RN and Post -op Vital signs reviewed and stable  Post vital signs: Reviewed and stable  Last Vitals:  Vitals Value Taken Time  BP    Temp    Pulse 61 12/05/2018  9:00 AM  Resp 24 12/05/2018  9:00 AM  SpO2 100 % 12/05/2018  9:00 AM  Vitals shown include unvalidated device data.  Last Pain:  Vitals:   12/05/18 0838  TempSrc:   PainSc: 0-No pain      Patients Stated Pain Goal: 8 (68/34/19 6222)  Complications: No apparent anesthesia complications

## 2018-12-05 NOTE — Op Note (Signed)
[facility] Patient Name: [name] Procedure Date: 12/05/2018 8:08 AM MRN: [id number] Date of Birth: October 04, 1945 Attending MD: Hildred Laser , MD CSN: [id number] Age: 73 Admit Type: Outpatient Procedure:                Upper GI endoscopy Indications:              Follow-up of peptic ulcer, Early satiety, Weight                            loss Providers:                Hildred Laser, MD, Otis Peak B. Sharon Seller, RN, Aram Candela Referring MD:             Jasper Loser. Luan Pulling, MD Medicines:                Propofol per Anesthesia Complications:            No immediate complications. Estimated Blood Loss:     Estimated blood loss was minimal. Procedure:                Pre-Anesthesia Assessment:                           - Prior to the procedure, a History and Physical                            was performed, and patient medications and                            allergies were reviewed. The patient's tolerance of                            previous anesthesia was also reviewed. The risks                            and benefits of the procedure and the sedation                            options and risks were discussed with the patient.                            All questions were answered, and informed consent                            was obtained. Prior Anticoagulants: The patient has                            taken no previous anticoagulant or antiplatelet                            agents. ASA Grade Assessment: III - A patient with  severe systemic disease. After reviewing the risks                            and benefits, the patient was deemed in                            satisfactory condition to undergo the procedure.                           After obtaining informed consent, the endoscope was                            passed under direct vision. Throughout the                            procedure, the patient's  blood pressure, pulse, and                            oxygen saturations were monitored continuously. The                            [id number]-[id number] ([id number]) was introduced through the and                            advanced to the second part of duodenum. The upper                            GI endoscopy was accomplished without difficulty.                            The patient tolerated the procedure well. Scope In: 8:42:36 AM Scope Out: 8:49:29 AM Total Procedure Duration: 0 hours 6 minutes 53 seconds  Findings:      The examined esophagus was normal.      The Z-line was regular and was found 38 cm from the incisors.      A 2 cm hiatal hernia was present.      A healed ulcer was found in the gastric antrum.      Localized mild mucosal changes characterized by discoloration and       nodularity were found in the gastric body. Biopsies were taken with a       cold forceps for histology.      The exam of the stomach was otherwise normal.      The duodenal bulb and second portion of the duodenum were normal. Impression:               - Normal esophagus.                           - Z-line regular, 38 cm from the incisors.                           - 2 cm hiatal hernia.                           - Scar in the gastric  antrum.                           - Discolored and nodular mucosa in the gastric                            body. Biopsied.                           - Normal duodenal bulb and second portion of the                            duodenum. Moderate Sedation:      Per Anesthesia Care Recommendation:           - Patient has a contact number available for                            emergencies. The signs and symptoms of potential                            delayed complications were discussed with the                            patient. Return to normal activities tomorrow.                            Written discharge instructions were provided to the                             patient.                           - Resume previous diet today.                           - Continue present medications except decrease                            Pantoprazole to once daily.                           - Await pathology results.                           - No aspirin, ibuprofen, naproxen, or other                            non-steroidal anti-inflammatory drugs for 1 day. Procedure Code(s):        --- Professional ---                           [phone], Esophagogastroduodenoscopy, flexible,                            transoral; with biopsy, single or multiple Diagnosis Code(s):        --- Professional ---  K44.9, Diaphragmatic hernia without obstruction or                            gangrene                           K31.89, Other diseases of stomach and duodenum                           K27.9, Peptic ulcer, site unspecified, unspecified                            as acute or chronic, without hemorrhage or                            perforation                           R68.81, Early satiety                           R63.4, Abnormal weight loss CPT copyright 2018 American Medical Association. All rights reserved. The codes documented in this report are preliminary and upon coder review may  be revised to meet current compliance requirements. Hildred Laser, MD Hildred Laser, MD 12/05/2018 9:02:19 AM This report has been signed electronically. Number of Addenda: 0

## 2018-12-05 NOTE — Anesthesia Preprocedure Evaluation (Signed)
Anesthesia Evaluation  Patient identified by MRN, date of birth, ID band Patient awake    Reviewed: Allergy & Precautions, NPO status , Patient's Chart, lab work & pertinent test results  Airway Mallampati: II  TM Distance: >3 FB Neck ROM: Full    Dental no notable dental hx. (+) Missing, Poor Dentition   Pulmonary neg pulmonary ROS, Current Smoker,    Pulmonary exam normal breath sounds clear to auscultation       Cardiovascular Exercise Tolerance: Poor + CAD and +CHF  Normal cardiovascular examI Rhythm:Regular Rate:Normal  Poor historians -denies heart issues    Neuro/Psych PSYCHIATRIC DISORDERS negative neurological ROS     GI/Hepatic Neg liver ROS, PUD, GERD  Medicated and Controlled,  Endo/Other  negative endocrine ROS  Renal/GU Renal InsufficiencyRenal disease  negative genitourinary   Musculoskeletal negative musculoskeletal ROS (+)   Abdominal   Peds negative pediatric ROS (+)  Hematology negative hematology ROS (+) anemia ,   Anesthesia Other Findings   Reproductive/Obstetrics negative OB ROS                             Anesthesia Physical Anesthesia Plan  ASA: III  Anesthesia Plan: General   Post-op Pain Management:    Induction: Intravenous  PONV Risk Score and Plan:   Airway Management Planned: Nasal Cannula and Simple Face Mask  Additional Equipment:   Intra-op Plan:   Post-operative Plan:   Informed Consent: I have reviewed the patients History and Physical, chart, labs and discussed the procedure including the risks, benefits and alternatives for the proposed anesthesia with the patient or authorized representative who has indicated his/her understanding and acceptance.     Dental advisory given  Plan Discussed with: CRNA  Anesthesia Plan Comments:         Anesthesia Quick Evaluation

## 2018-12-05 NOTE — Discharge Instructions (Signed)
Decrease pantoprazole to once daily.  Take 40 mg by mouth 30 minutes before breakfast. Resume other medications as before. Resume usual diet. No driving for 24 hours. Physician will call with biopsy results.  Upper Endoscopy, Adult, Care After This sheet gives you information about how to care for yourself after your procedure. Your health care provider may also give you more specific instructions. If you have problems or questions, contact your health care provider. What can I expect after the procedure? After the procedure, it is common to have:  A sore throat.  Mild stomach pain or discomfort.  Bloating.  Nausea. Follow these instructions at home:   Follow instructions from your health care provider about what to eat or drink after your procedure.  Return to your normal activities as told by your health care provider. Ask your health care provider what activities are safe for you.  Take over-the-counter and prescription medicines only as told by your health care provider.  Do not drive for 24 hours if you were given a sedative during your procedure.  Keep all follow-up visits as told by your health care provider. This is important. Contact a health care provider if you have:  A sore throat that lasts longer than one day.  Trouble swallowing. Get help right away if:  You vomit blood or your vomit looks like coffee grounds.  You have: ? A fever. ? Bloody, black, or tarry stools. ? A severe sore throat or you cannot swallow. ? Difficulty breathing. ? Severe pain in your chest or abdomen. Summary  After the procedure, it is common to have a sore throat, mild stomach discomfort, bloating, and nausea.  Do not drive for 24 hours if you were given a sedative during the procedure.  Follow instructions from your health care provider about what to eat or drink after your procedure.  Return to your normal activities as told by your health care provider. This information  is not intended to replace advice given to you by your health care provider. Make sure you discuss any questions you have with your health care provider. Document Released: 03/04/2012 Document Revised: 02/03/2018 Document Reviewed: 02/03/2018 Elsevier Interactive Patient Education  2019 [place], Care After These instructions provide you with information about caring for yourself after your procedure. Your health care provider may also give you more specific instructions. Your treatment has been planned according to current medical practices, but problems sometimes occur. Call your health care provider if you have any problems or questions after your procedure. What can I expect after the procedure? After your procedure, you may:  Feel sleepy for several hours.  Feel clumsy and have poor balance for several hours.  Feel forgetful about what happened after the procedure.  Have poor judgment for several hours.  Feel nauseous or vomit.  Have a sore throat if you had a breathing tube during the procedure. Follow these instructions at home: For at least 24 hours after the procedure:      Have a responsible adult stay with you. It is important to have someone help care for you until you are awake and alert.  Rest as needed.  Do not: ? Participate in activities in which you could fall or become injured. ? Drive. ? Use heavy machinery. ? Drink alcohol. ? Take sleeping pills or medicines that cause drowsiness. ? Make important decisions or sign legal documents. ? Take care of children on your own. Eating and drinking  Follow  the diet that is recommended by your health care provider.  If you vomit, drink water, juice, or soup when you can drink without vomiting.  Make sure you have little or no nausea before eating solid foods. General instructions  Take over-the-counter and prescription medicines only as told by your health care provider.  If  you have sleep apnea, surgery and certain medicines can increase your risk for breathing problems. Follow instructions from your health care provider about wearing your sleep device: ? Anytime you are sleeping, including during daytime naps. ? While taking prescription pain medicines, sleeping medicines, or medicines that make you drowsy.  If you smoke, do not smoke without supervision.  Keep all follow-up visits as told by your health care provider. This is important. Contact a health care provider if:  You keep feeling nauseous or you keep vomiting.  You feel light-headed.  You develop a rash.  You have a fever. Get help right away if:  You have trouble breathing. Summary  For several hours after your procedure, you may feel sleepy and have poor judgment.  Have a responsible adult stay with you for at least 24 hours or until you are awake and alert. This information is not intended to replace advice given to you by your health care provider. Make sure you discuss any questions you have with your health care provider. Document Released: 12/25/2015 Document Revised: 04/19/2017 Document Reviewed: 12/25/2015 Elsevier Interactive Patient Education  2019 [employer].

## 2018-12-08 ENCOUNTER — Encounter (HOSPITAL_COMMUNITY): Payer: Self-pay | Admitting: Internal Medicine

## 2018-12-15 ENCOUNTER — Encounter (INDEPENDENT_AMBULATORY_CARE_PROVIDER_SITE_OTHER): Payer: Self-pay | Admitting: *Deleted

## 2019-03-24 ENCOUNTER — Encounter (HOSPITAL_COMMUNITY): Payer: Self-pay | Admitting: Emergency Medicine

## 2019-03-24 ENCOUNTER — Other Ambulatory Visit: Payer: Self-pay

## 2019-03-24 ENCOUNTER — Emergency Department (HOSPITAL_COMMUNITY): Payer: Medicare Other

## 2019-03-24 ENCOUNTER — Emergency Department (HOSPITAL_COMMUNITY)
Admission: EM | Admit: 2019-03-24 | Discharge: 2019-03-24 | Disposition: A | Discharge status: Home / Self Care | Payer: Medicare Other | Attending: Emergency Medicine | Admitting: Emergency Medicine

## 2019-03-24 DIAGNOSIS — M19041 Primary osteoarthritis, right hand: Secondary | ICD-10-CM | POA: Diagnosis not present

## 2019-03-24 DIAGNOSIS — M25531 Pain in right wrist: Secondary | ICD-10-CM | POA: Diagnosis not present

## 2019-03-24 DIAGNOSIS — S60452A Superficial foreign body of right middle finger, initial encounter: Secondary | ICD-10-CM | POA: Diagnosis not present

## 2019-03-24 DIAGNOSIS — I2510 Atherosclerotic heart disease of native coronary artery without angina pectoris: Secondary | ICD-10-CM | POA: Insufficient documentation

## 2019-03-24 DIAGNOSIS — F17210 Nicotine dependence, cigarettes, uncomplicated: Secondary | ICD-10-CM | POA: Diagnosis not present

## 2019-03-24 DIAGNOSIS — Z79899 Other long term (current) drug therapy: Secondary | ICD-10-CM | POA: Insufficient documentation

## 2019-03-24 DIAGNOSIS — M79641 Pain in right hand: Secondary | ICD-10-CM | POA: Diagnosis present

## 2019-03-24 MED ORDER — PREDNISONE 20 MG PO TABS: 40.0000 mg | ORAL_TABLET | Freq: Every day | ORAL | 0 refills | Status: AC

## 2019-03-24 NOTE — ED Provider Notes (Signed)
[facility] EMERGENCY DEPARTMENT Provider Note   CSN: [id number] Arrival date & time: 03/24/19  1616     History   Chief Complaint Chief Complaint  Patient presents with   Hand Problem    HPI [name] is a 73 y.o. male presenting for evaluation of right hand pain.  Patient states last night he started developed right hand pain.  Pain is at the base of the hypothenar eminence.  Pain with light touch.  He has not taken anything for pain including Tylenol or ibuprofen.  He denies fall, trauma, or injury.  He denies itching.  He denies pain elsewhere.  He denies numbness or tingling.  No pain with movement of his pinky, increased pain with movement of his wrist.  No tenderness palpation on the radial aspect. Ho h/o gout.  Denies fevers or chills.     HPI  Past Medical History:  Diagnosis Date   Arteriosclerotic cardiovascular disease (ASCVD)    IMI in 5/07 with CTO mid cx, 60% LAD, 99% RCA-> BMS; mod. impaired LV function; EF:35-40% 2/10   Chronic kidney disease    STAGE 3-4-creatinine 2.5 in 2009; 1.7 in 10/2008   Cirrhosis ([place])    History of excessive alcohol use; continuing social use   Gastroesophageal reflux disease    Hyperlipidemia    Tobacco abuse    60 pack years; one pack per day    Patient Active Problem List   Diagnosis Date Noted   Gastric ulcer without hemorrhage or perforation 11/21/2018   Hematochezia    Chronic systolic congestive heart failure ([place])    Hemorrhagic shock and encephalopathy syndrome (HCC)    Acute upper GI bleed    Gastric ulcer 12/20/2016   Acute blood loss anemia 12/20/2016   Hypotension 12/18/2016   Volume depletion 12/18/2016   Ischemic cardiomyopathy 12/18/2016   Anemia 12/18/2016   Heme + stool 12/18/2016   Dehydration    Thrombocytopenia (HCC)    Malnutrition of moderate degree 12/15/2016   AKI (acute kidney injury) ([place]) 12/13/2016   Unintentional weight loss 12/13/2016   Alcohol dependence  ([place]) 12/13/2016   Hyperglycemia 12/13/2016   Elevated troponin 12/13/2016   Anemia, normocytic normochromic 03/28/2012   Coronary artery disease    Gastroesophageal reflux disease    Chronic kidney disease    Cirrhosis ([place])    Tobacco abuse     Past Surgical History:  Procedure Laterality Date   BIOPSY  12/05/2018   Procedure: BIOPSY;  Surgeon: Rogene Houston, MD;  Location: [facility];  Service: Endoscopy;;  gastric   ESOPHAGOGASTRODUODENOSCOPY N/A 12/19/2016   Procedure: ESOPHAGOGASTRODUODENOSCOPY (EGD);  Surgeon: Rogene Houston, MD;  Location: [facility];  Service: Endoscopy;  Laterality: N/A;   ESOPHAGOGASTRODUODENOSCOPY N/A 12/21/2016   Procedure: ESOPHAGOGASTRODUODENOSCOPY (EGD);  Surgeon: Danie Binder, MD;  Location: [facility];  Service: Endoscopy;  Laterality: N/A;   ESOPHAGOGASTRODUODENOSCOPY (EGD) WITH PROPOFOL N/A 12/22/2016   Procedure: ESOPHAGOGASTRODUODENOSCOPY (EGD) WITH PROPOFOL;  Surgeon: Mauri Pole, MD;  Location: [facility];  Service: Endoscopy;  Laterality: N/A;   ESOPHAGOGASTRODUODENOSCOPY (EGD) WITH PROPOFOL N/A 12/05/2018   Procedure: ESOPHAGOGASTRODUODENOSCOPY (EGD) WITH PROPOFOL;  Surgeon: Rogene Houston, MD;  Location: [facility];  Service: Endoscopy;  Laterality: N/A;  1:35        Home Medications    Prior to Admission medications   Medication Sig Start Date End Date Taking? Authorizing Provider  atorvastatin (LIPITOR) 40 MG tablet Take 40 mg by mouth daily at  6 PM.  04/02/17   [provider]  carvedilol (COREG) 12.5 MG tablet TAKE 1 TABLET BY MOUTH TWICE DAILY. Patient taking differently: Take 12.5 mg by mouth 2 (two) times daily with a meal.  07/07/14   Herminio Commons, MD  colchicine 0.6 MG tablet Take 1 tablet (0.6 mg total) by mouth 2 (two) times daily. 12/26/16 11/27/18  Reyne Dumas, MD  furosemide (LASIX) 40 MG tablet Take 40 mg by mouth daily.  04/02/17   [provider]  magnesium oxide  (MAG-OX) 400 (241.3 Mg) MG tablet Take 1 tablet (400 mg total) by mouth daily. 12/27/16   Reyne Dumas, MD  nitroGLYCERIN (NITROSTAT) 0.4 MG SL tablet Place 0.4 mg under the tongue as needed.      [provider]  pantoprazole (PROTONIX) 40 MG tablet Take 1 tablet (40 mg total) by mouth daily before breakfast. 12/05/18   Rehman, Mechele Dawley, MD  predniSONE (DELTASONE) 20 MG tablet Take 2 tablets (40 mg total) by mouth daily for 5 days. 03/24/19 03/29/19  Lilyanne Mcquown, PA-C  thiamine 100 MG tablet Take 1 tablet (100 mg total) by mouth daily. 12/27/16   Reyne Dumas, MD    Family History Family History  Problem Relation Age of Onset   Other Brother     Social History Social History   Tobacco Use   Smoking status: Current Every Day Smoker    Packs/day: 1.00    Years: 60.00    Pack years: 60.00   Smokeless tobacco: Never Used  Substance Use Topics   Alcohol use: Yes    Comment: per sister "he had one beer last week"   Drug use: No     Allergies   Patient has no known allergies.   Review of Systems Review of Systems  Musculoskeletal: Positive for arthralgias.  Neurological: Negative for numbness.     Physical Exam Updated Vital Signs BP 106/63 (BP Location: Right Arm)    Pulse 79    Temp 97.8 F (36.6 C) (Oral)    Resp 16    Ht 5\' 6"  (1.676 m)    Wt 59.9 kg    SpO2 98%    BMI 21.31 kg/m   Physical Exam Vitals signs and nursing note reviewed.  Constitutional:      General: He is not in acute distress.    Appearance: He is well-developed.  HENT:     Head: Normocephalic and atraumatic.  Neck:     Musculoskeletal: Normal range of motion.  Pulmonary:     Effort: Pulmonary effort is normal.  Abdominal:     General: There is no distension.  Musculoskeletal: Normal range of motion.       Arms:     Comments: Mild erythema and swelling at the base of the hyperthenar eminence and at the ulnar wrist.  Decreased range of motion due to pain, but patient able to  range.  No streaking in the forearm up the hand.  No tenderness palpation over the median nerve.  No tenderness palpation of the radial wrist.  Skin:    General: Skin is warm.     Capillary Refill: Capillary refill takes less than 2 seconds.     Findings: No rash.  Neurological:     Mental Status: He is alert and oriented to person, place, and time.      ED Treatments / Results  Labs (all labs ordered are listed, but only abnormal results are displayed) Labs Reviewed - No data to display  EKG None  Radiology Dg Wrist Complete Right  Result Date: 03/24/2019 CLINICAL DATA:  Pain and redness to bilateral wrists. Pt denies any injury or surgery to both wrist. Pt denies any on body medical injector devices. EXAM: RIGHT WRIST - COMPLETE 3+ VIEW COMPARISON:  None. FINDINGS: No fracture or bone lesion. Joints are normally spaced and aligned. No significant arthropathic change. Soft tissues are unremarkable. IMPRESSION: Negative. Electronically Signed   By: Lajean Manes M.D.   On: 03/24/2019 17:44   Dg Hand Complete Right  Result Date: 03/24/2019 CLINICAL DATA:  Pain and redness to bilateral wrists. Pt denies any injury or surgery to both wrist. EXAM: RIGHT HAND - COMPLETE 3+ VIEW COMPARISON:  None. FINDINGS: No fracture or bone lesion. There is a narrow articulation between the lunate and triquetrum, which is likely developmental. Remaining wrist joints are normally spaced and aligned. There is asymmetric joint space narrowing of the fifth metacarpal phalangeal joint. With irregularity of the metatarsal head that may be developmental. Remaining joints are normally spaced and aligned. Small metal foreign body lies within the superficial soft tissues of the dorsal aspect of the distal middle finger. IMPRESSION: 1. No fracture or acute finding. 2. Arthropathic changes at the fifth metacarpophalangeal joint. Narrowed articulation between the lunate and triquetrum, likely developmental. 3. Small metal  foreign body within the dorsal superficial soft tissues of the distal middle finger. Electronically Signed   By: Lajean Manes M.D.   On: 03/24/2019 17:48    Procedures Procedures (including critical care time)  Medications Ordered in ED Medications - No data to display   Initial Impression / Assessment and Plan / ED Course  I have reviewed the triage vital signs and the nursing notes.  Pertinent labs & imaging results that were available during my care of the patient were reviewed by me and considered in my medical decision making (see chart for details).        Presenting for evaluation of pain and swelling of the right wrist.  Physical exam shows patient is neurovascularly intact.  Low suspicion for septic joint, patient without fevers and able to range. xrays viewed and interpreted by me, no fx or dislocation. No joint effusion.  Consider gout versus pseudogout versus MSK cause.  Doubt insect bite as there is no itching.  Whether it is gout, pseudogout, or MSK cause, will treat with anti-inflammatories.  Patient with CKD, last creatinine at 2.2.  As such, will treat with course of prednisone. F/u with pcp as needed. At this time, pt appears safe for d/c. Return precautions given. Pt states he understands and agrees to plan.   Final Clinical Impressions(s) / ED Diagnoses   Final diagnoses:  Right wrist pain    ED Discharge Orders         Ordered    predniSONE (DELTASONE) 20 MG tablet  Daily     03/24/19 1809           Franchot Heidelberg, PA-C 03/24/19 2255    Maudie Flakes, MD 03/25/19 1036

## 2019-03-24 NOTE — ED Triage Notes (Signed)
Pain and redness to RT wrist. Denies any injury.  Thinks he may have been bit by something.

## 2019-03-24 NOTE — ED Notes (Signed)
Patient transported to X-ray 

## 2019-03-24 NOTE — Discharge Instructions (Addendum)
Take prednisone as prescribed. Take the entire course.  Do not take other antiinflammatories such as ibuprofen or aleve,. Take tylenol for pain control.  Follow up with your primary care doctor if symptoms are not improving.  Return to the ER if you develop fevers, numbness, inability to move your wrist/hand, or with any new, worsening, or concerning symptoms.

## 2019-03-26 DIAGNOSIS — M109 Gout, unspecified: Secondary | ICD-10-CM | POA: Diagnosis not present

## 2019-03-26 DIAGNOSIS — J449 Chronic obstructive pulmonary disease, unspecified: Secondary | ICD-10-CM | POA: Diagnosis not present

## 2019-03-26 DIAGNOSIS — I10 Essential (primary) hypertension: Secondary | ICD-10-CM | POA: Diagnosis not present

## 2019-04-13 ENCOUNTER — Encounter (INDEPENDENT_AMBULATORY_CARE_PROVIDER_SITE_OTHER): Payer: Self-pay | Admitting: Internal Medicine

## 2019-04-13 ENCOUNTER — Other Ambulatory Visit: Payer: Self-pay

## 2019-04-13 ENCOUNTER — Ambulatory Visit (INDEPENDENT_AMBULATORY_CARE_PROVIDER_SITE_OTHER): Payer: Medicare Other | Admitting: Internal Medicine

## 2019-04-13 VITALS — BP 83/48 | HR 78 | Temp 97.7°F | Ht 67.0 in | Wt 118.0 lb

## 2019-04-13 DIAGNOSIS — K922 Gastrointestinal hemorrhage, unspecified: Secondary | ICD-10-CM | POA: Diagnosis not present

## 2019-04-13 NOTE — Progress Notes (Signed)
Subjective:    Patient ID: [name], male    DOB: [birth year], 73 y.o.   MRN: [id number]  HPI Here today for f/u. Last seen in March of this year. Hx of UGIB.  Underwent 3 EGDs in April of 2018 for acute hemorrhagic anemia. Had a drop in hemoglobin and was transfused.  He was transferred to [facility] on 12/21/2016 after 2nd EGD revealed a large visible vessel in the gastric ulcer base. The ulcer was oozing and there was red blood and clots in the stomach. The ulcer base was injected with epinephrine. He was transferred to [facility] in the  event IR embolization became necessary. IR saw patient and patient was not actively bleeding and there was no plan for intervention at that time.  He tells me he is doing good. No melena or BRRB. No NSAIDS. He continues to drink beer. Has not drank in about 3 weeks. His appetite is okay. He has lost from 122 to 118.  Has a BM about once a day. No change in his stools. He is eating 3 meals a day but in small amts per wife. Continues to smoke 1 pack a day. Smoking for over 40 yrs.     12/22/2016 EGD:  Impression: - Normal esophagus.  - Non-obstructing non-bleeding gastric ulcer with a  visible vessel. NSAID induced etiology. Treated  with bipolar cautery. Clip (MR conditional) was  placed adjacent to the ulcer for location marking  to facilitate visulization on angio or CTA.  - Normal examined duodenum.  - No specimens collected.   12/21/2016 EGD melena, hematemesis  OBSCURED BY CLOT.  Impression: - HYPOTENSION/HEMATEMESIS/MELENA DUE TO LARGE  gastric ulcer with a visible vessel. Injected.   12/19/2016 EGD  - Z-line irregular, 38 cm from the incisors.  - 2 cm hiatal hernia.  - Granular and nodular mucosa in the gastric fundus  and gastric body. Biopsied.  - Non-bleeding gastric ulcer with pigmented  material. Biopsied.  - Normal duodenal bulb and second portion of    CBC    Component Value Date/Time   WBC 4.6 12/01/2018 1428   RBC 3.43 (L)  12/01/2018 1428   HGB 10.5 (L) 12/01/2018 1428   HCT 32.2 (L) 12/01/2018 1428   PLT 141 (L) 12/01/2018 1428   MCV 93.9 12/01/2018 1428   MCH 30.6 12/01/2018 1428   MCHC 32.6 12/01/2018 1428   RDW 13.0 12/01/2018 1428   LYMPHSABS 1.8 12/01/2018 1428   MONOABS 0.3 12/01/2018 1428   EOSABS 0.3 12/01/2018 1428   BASOSABS 0.0 12/01/2018 1428     Review of Systems Past Medical History:  Diagnosis Date  . Arteriosclerotic cardiovascular disease (ASCVD)    IMI in 5/07 with CTO mid cx, 60% LAD, 99% RCA-> BMS; mod. impaired LV function; EF:35-40% 2/10  . Chronic kidney disease    STAGE 3-4-creatinine 2.5 in 2009; 1.7 in 10/2008  . Cirrhosis ([place])    History of excessive alcohol use; continuing social use  . Gastroesophageal reflux disease   . Hyperlipidemia   . Tobacco abuse    60 pack years; one pack per day    Past Surgical History:  Procedure Laterality Date  . BIOPSY  12/05/2018   Procedure: BIOPSY;  Surgeon: Rogene Houston, MD;  [facility];  Service: Endoscopy;;  gastric  . ESOPHAGOGASTRODUODENOSCOPY N/A 12/19/2016   Procedure: ESOPHAGOGASTRODUODENOSCOPY (EGD);  Surgeon: Rogene Houston, MD;  [facility];  Service: Endoscopy;  Laterality: N/A;  . ESOPHAGOGASTRODUODENOSCOPY N/A  12/21/2016   Procedure: ESOPHAGOGASTRODUODENOSCOPY (EGD);  Surgeon: Danie Binder, MD;  [facility];  Service: Endoscopy;  Laterality: N/A;  . ESOPHAGOGASTRODUODENOSCOPY (EGD) WITH PROPOFOL N/A 12/22/2016   Procedure: ESOPHAGOGASTRODUODENOSCOPY (EGD) WITH PROPOFOL;  Surgeon: Mauri Pole, MD;  Location: [place] [facility];  Service: Endoscopy;  Laterality: N/A;  . ESOPHAGOGASTRODUODENOSCOPY (EGD) WITH PROPOFOL N/A 12/05/2018   Procedure: ESOPHAGOGASTRODUODENOSCOPY (EGD) WITH PROPOFOL;  Surgeon: Rogene Houston, MD;  [facility];  Service: Endoscopy;  Laterality: N/A;  1:35    No Known Allergies  Current Outpatient Medications on File Prior to Visit   Medication Sig Dispense Refill  . atorvastatin (LIPITOR) 40 MG tablet Take 40 mg by mouth daily at 6 PM.     . carvedilol (COREG) 12.5 MG tablet TAKE 1 TABLET BY MOUTH TWICE DAILY. (Patient taking differently: Take 12.5 mg by mouth 2 (two) times daily with a meal. ) 60 tablet 0  . furosemide (LASIX) 40 MG tablet Take 40 mg by mouth daily.     . magnesium oxide (MAG-OX) 400 (241.3 Mg) MG tablet Take 1 tablet (400 mg total) by mouth daily. 30 tablet 0  . nitroGLYCERIN (NITROSTAT) 0.4 MG SL tablet Place 0.4 mg under the tongue as needed.      . pantoprazole (PROTONIX) 40 MG tablet Take 1 tablet (40 mg total) by mouth daily before breakfast. 30 tablet 5  . thiamine 100 MG tablet Take 1 tablet (100 mg total) by mouth daily. 30 tablet 2  . colchicine 0.6 MG tablet Take 1 tablet (0.6 mg total) by mouth 2 (two) times daily. 14 tablet 0   No current facility-administered medications on file prior to visit.         Objective:   Physical Exam Blood pressure (!) 83/48, pulse 78, temperature 97.7 F (36.5 C), height 5\' 7"  (1.702 m), weight 118 lb (53.5 kg). Alert and oriented. Skin warm and dry. Oral mucosa is moist.   . Sclera anicteric, conjunctivae is pink. Thyroid not enlarged. No cervical lymphadenopathy. Lungs clear. Heart regular rate and rhythm.  Abdomen is soft. Bowel sounds are positive. No hepatomegaly. No abdominal masses felt. No tenderness.  No edema to lower extremities.          Assessment & Plan:  UGIB. Continue the Protonix. He is on a recall for an EGD in 1 year.

## 2019-04-13 NOTE — Patient Instructions (Signed)
EGD in 1 year.

## 2019-04-20 ENCOUNTER — Other Ambulatory Visit (INDEPENDENT_AMBULATORY_CARE_PROVIDER_SITE_OTHER): Payer: Self-pay | Admitting: Internal Medicine

## 2019-04-20 DIAGNOSIS — I10 Essential (primary) hypertension: Secondary | ICD-10-CM | POA: Diagnosis not present

## 2019-04-20 DIAGNOSIS — M109 Gout, unspecified: Secondary | ICD-10-CM | POA: Diagnosis not present

## 2019-04-20 DIAGNOSIS — J449 Chronic obstructive pulmonary disease, unspecified: Secondary | ICD-10-CM | POA: Diagnosis not present

## 2019-05-29 DIAGNOSIS — J449 Chronic obstructive pulmonary disease, unspecified: Secondary | ICD-10-CM | POA: Diagnosis not present

## 2019-05-29 DIAGNOSIS — I10 Essential (primary) hypertension: Secondary | ICD-10-CM | POA: Diagnosis not present

## 2019-05-29 DIAGNOSIS — Z23 Encounter for immunization: Secondary | ICD-10-CM | POA: Diagnosis not present

## 2019-06-08 ENCOUNTER — Inpatient Hospital Stay (HOSPITAL_COMMUNITY)
Admission: EM | Admit: 2019-06-08 | Discharge: 2019-06-11 | DRG: 683 | Disposition: A | Discharge status: Home Health | Payer: Medicare Other | Attending: Pulmonary Disease | Admitting: Pulmonary Disease

## 2019-06-08 ENCOUNTER — Encounter (HOSPITAL_COMMUNITY): Payer: Self-pay | Admitting: *Deleted

## 2019-06-08 ENCOUNTER — Emergency Department (HOSPITAL_COMMUNITY): Payer: Medicare Other

## 2019-06-08 ENCOUNTER — Observation Stay (HOSPITAL_COMMUNITY): Payer: Medicare Other

## 2019-06-08 ENCOUNTER — Other Ambulatory Visit: Payer: Self-pay

## 2019-06-08 DIAGNOSIS — K2950 Unspecified chronic gastritis without bleeding: Secondary | ICD-10-CM | POA: Diagnosis not present

## 2019-06-08 DIAGNOSIS — I959 Hypotension, unspecified: Secondary | ICD-10-CM | POA: Diagnosis present

## 2019-06-08 DIAGNOSIS — K7460 Unspecified cirrhosis of liver: Secondary | ICD-10-CM | POA: Diagnosis present

## 2019-06-08 DIAGNOSIS — N390 Urinary tract infection, site not specified: Secondary | ICD-10-CM | POA: Diagnosis present

## 2019-06-08 DIAGNOSIS — R4189 Other symptoms and signs involving cognitive functions and awareness: Secondary | ICD-10-CM | POA: Diagnosis present

## 2019-06-08 DIAGNOSIS — E872 Acidosis: Secondary | ICD-10-CM | POA: Diagnosis not present

## 2019-06-08 DIAGNOSIS — E875 Hyperkalemia: Secondary | ICD-10-CM | POA: Diagnosis present

## 2019-06-08 DIAGNOSIS — I255 Ischemic cardiomyopathy: Secondary | ICD-10-CM | POA: Diagnosis present

## 2019-06-08 DIAGNOSIS — F17210 Nicotine dependence, cigarettes, uncomplicated: Secondary | ICD-10-CM | POA: Diagnosis present

## 2019-06-08 DIAGNOSIS — E869 Volume depletion, unspecified: Secondary | ICD-10-CM | POA: Diagnosis present

## 2019-06-08 DIAGNOSIS — K219 Gastro-esophageal reflux disease without esophagitis: Secondary | ICD-10-CM | POA: Diagnosis present

## 2019-06-08 DIAGNOSIS — E860 Dehydration: Secondary | ICD-10-CM | POA: Diagnosis not present

## 2019-06-08 DIAGNOSIS — M25571 Pain in right ankle and joints of right foot: Secondary | ICD-10-CM | POA: Diagnosis present

## 2019-06-08 DIAGNOSIS — N179 Acute kidney failure, unspecified: Principal | ICD-10-CM | POA: Diagnosis present

## 2019-06-08 DIAGNOSIS — Z79899 Other long term (current) drug therapy: Secondary | ICD-10-CM

## 2019-06-08 DIAGNOSIS — I5022 Chronic systolic (congestive) heart failure: Secondary | ICD-10-CM | POA: Diagnosis not present

## 2019-06-08 DIAGNOSIS — D649 Anemia, unspecified: Secondary | ICD-10-CM | POA: Diagnosis present

## 2019-06-08 DIAGNOSIS — E440 Moderate protein-calorie malnutrition: Secondary | ICD-10-CM | POA: Diagnosis not present

## 2019-06-08 DIAGNOSIS — R627 Adult failure to thrive: Secondary | ICD-10-CM | POA: Diagnosis not present

## 2019-06-08 DIAGNOSIS — N189 Chronic kidney disease, unspecified: Secondary | ICD-10-CM | POA: Diagnosis present

## 2019-06-08 DIAGNOSIS — R531 Weakness: Secondary | ICD-10-CM | POA: Diagnosis not present

## 2019-06-08 DIAGNOSIS — I2510 Atherosclerotic heart disease of native coronary artery without angina pectoris: Secondary | ICD-10-CM | POA: Diagnosis not present

## 2019-06-08 DIAGNOSIS — J449 Chronic obstructive pulmonary disease, unspecified: Secondary | ICD-10-CM | POA: Diagnosis present

## 2019-06-08 DIAGNOSIS — Z8711 Personal history of peptic ulcer disease: Secondary | ICD-10-CM | POA: Diagnosis not present

## 2019-06-08 DIAGNOSIS — G8929 Other chronic pain: Secondary | ICD-10-CM | POA: Diagnosis present

## 2019-06-08 DIAGNOSIS — N183 Chronic kidney disease, stage 3 (moderate): Secondary | ICD-10-CM | POA: Diagnosis present

## 2019-06-08 DIAGNOSIS — E785 Hyperlipidemia, unspecified: Secondary | ICD-10-CM | POA: Diagnosis not present

## 2019-06-08 DIAGNOSIS — R52 Pain, unspecified: Secondary | ICD-10-CM

## 2019-06-08 DIAGNOSIS — F1011 Alcohol abuse, in remission: Secondary | ICD-10-CM | POA: Diagnosis present

## 2019-06-08 DIAGNOSIS — Z6822 Body mass index (BMI) 22.0-22.9, adult: Secondary | ICD-10-CM

## 2019-06-08 DIAGNOSIS — Z720 Tobacco use: Secondary | ICD-10-CM | POA: Diagnosis present

## 2019-06-08 DIAGNOSIS — R634 Abnormal weight loss: Secondary | ICD-10-CM | POA: Diagnosis present

## 2019-06-08 DIAGNOSIS — Z20828 Contact with and (suspected) exposure to other viral communicable diseases: Secondary | ICD-10-CM | POA: Diagnosis present

## 2019-06-08 DIAGNOSIS — K5900 Constipation, unspecified: Secondary | ICD-10-CM | POA: Diagnosis present

## 2019-06-08 LAB — URINALYSIS, ROUTINE W REFLEX MICROSCOPIC
Bilirubin Urine: NEGATIVE
Glucose, UA: NEGATIVE mg/dL
Hgb urine dipstick: NEGATIVE
Ketones, ur: NEGATIVE mg/dL
Nitrite: NEGATIVE
Protein, ur: NEGATIVE mg/dL
Specific Gravity, Urine: 1.012 (ref 1.005–1.030)
WBC, UA: >50 WBC/hpf — ABNORMAL HIGH (ref 0–5)
pH: 5 (ref 5.0–8.0)

## 2019-06-08 LAB — CBC WITH DIFFERENTIAL/PLATELET
Abs Immature Granulocytes: 0.02 10*3/uL (ref 0.00–0.07)
Basophils Absolute: 0 10*3/uL (ref 0.0–0.1)
Basophils Relative: 0 %
Eosinophils Absolute: 0 10*3/uL (ref 0.0–0.5)
Eosinophils Relative: 0 %
HCT: 28.1 % — ABNORMAL LOW (ref 39.0–52.0)
Hemoglobin: 9 g/dL — ABNORMAL LOW (ref 13.0–17.0)
Immature Granulocytes: 0 %
Lymphocytes Relative: 18 %
Lymphs Abs: 1.8 10*3/uL (ref 0.7–4.0)
MCH: 30.6 pg (ref 26.0–34.0)
MCHC: 32 g/dL (ref 30.0–36.0)
MCV: 95.6 fL (ref 80.0–100.0)
Monocytes Absolute: 0.4 10*3/uL (ref 0.1–1.0)
Monocytes Relative: 4 %
Neutro Abs: 7.4 10*3/uL (ref 1.7–7.7)
Neutrophils Relative %: 78 %
Platelets: 160 10*3/uL (ref 150–400)
RBC: 2.94 MIL/uL — ABNORMAL LOW (ref 4.22–5.81)
RDW: 13.6 % (ref 11.5–15.5)
WBC: 9.6 10*3/uL (ref 4.0–10.5)
nRBC: 0 % (ref 0.0–0.2)

## 2019-06-08 LAB — COMPREHENSIVE METABOLIC PANEL
ALT: 20 U/L (ref 0–44)
AST: 25 U/L (ref 15–41)
Albumin: 3.4 g/dL — ABNORMAL LOW (ref 3.5–5.0)
Alkaline Phosphatase: 59 U/L (ref 38–126)
Anion gap: 13 (ref 5–15)
BUN: 84 mg/dL — ABNORMAL HIGH (ref 8–23)
CO2: 25 mmol/L (ref 22–32)
Calcium: 9 mg/dL (ref 8.9–10.3)
Chloride: 99 mmol/L (ref 98–111)
Creatinine, Ser: 5.94 mg/dL — ABNORMAL HIGH (ref 0.61–1.24)
GFR calc Af Amer: 10 mL/min — ABNORMAL LOW (ref >60)
GFR calc non Af Amer: 9 mL/min — ABNORMAL LOW (ref >60)
Glucose, Bld: 153 mg/dL — ABNORMAL HIGH (ref 70–99)
Potassium: 5.9 mmol/L — ABNORMAL HIGH (ref 3.5–5.1)
Sodium: 137 mmol/L (ref 135–145)
Total Bilirubin: 0.3 mg/dL (ref 0.3–1.2)
Total Protein: 8.4 g/dL — ABNORMAL HIGH (ref 6.5–8.1)

## 2019-06-08 LAB — TROPONIN I (HIGH SENSITIVITY)
Troponin I (High Sensitivity): 35 ng/L — ABNORMAL HIGH (ref <18)
Troponin I (High Sensitivity): 31 ng/L — ABNORMAL HIGH (ref <18)

## 2019-06-08 LAB — LIPASE, BLOOD: Lipase: 47 U/L (ref 11–51)

## 2019-06-08 LAB — LACTIC ACID, PLASMA
Lactic Acid, Venous: 2.1 mmol/L (ref 0.5–1.9)
Lactic Acid, Venous: 1.1 mmol/L (ref 0.5–1.9)

## 2019-06-08 LAB — ETHANOL: Alcohol, Ethyl (B): <10 mg/dL (ref <10)

## 2019-06-08 LAB — URIC ACID: Uric Acid, Serum: 15 mg/dL — ABNORMAL HIGH (ref 3.7–8.6)

## 2019-06-08 LAB — MAGNESIUM: Magnesium: 3 mg/dL — ABNORMAL HIGH (ref 1.7–2.4)

## 2019-06-08 LAB — SARS CORONAVIRUS 2 BY RT PCR (HOSPITAL ORDER, PERFORMED IN ~~LOC~~ HOSPITAL LAB): SARS Coronavirus 2: NEGATIVE

## 2019-06-08 LAB — SAMPLE TO BLOOD BANK

## 2019-06-08 MED ORDER — MAGNESIUM OXIDE 400 (241.3 MG) MG PO TABS
400.0000 mg | ORAL_TABLET | Freq: Every day | ORAL | Status: DC
  Administered 2019-06-09 – 2019-06-11 (×3): 400 mg via ORAL
  Filled 2019-06-08 (×3): qty 1

## 2019-06-08 MED ORDER — ONDANSETRON HCL 4 MG/2ML IJ SOLN: 4.0000 mg | Freq: Four times a day (QID) | INTRAMUSCULAR | Status: DC | PRN

## 2019-06-08 MED ORDER — INSULIN ASPART 100 UNIT/ML ~~LOC~~ SOLN
5.0000 [IU] | Freq: Once | SUBCUTANEOUS | Status: AC
  Administered 2019-06-08: 5 [IU] via INTRAVENOUS

## 2019-06-08 MED ORDER — DEXTROSE 50 % IV SOLN
25.0000 mL | Freq: Once | INTRAVENOUS | Status: AC
  Administered 2019-06-08: 25 mL via INTRAVENOUS
  Filled 2019-06-08: qty 50

## 2019-06-08 MED ORDER — SODIUM CHLORIDE 0.9 % IV BOLUS (SEPSIS)
500.0000 mL | Freq: Once | INTRAVENOUS | Status: AC
  Administered 2019-06-08: 500 mL via INTRAVENOUS

## 2019-06-08 MED ORDER — HEPARIN SODIUM (PORCINE) 5000 UNIT/ML IJ SOLN
5000.0000 [IU] | Freq: Three times a day (TID) | INTRAMUSCULAR | Status: DC
  Administered 2019-06-08 – 2019-06-11 (×8): 5000 [IU] via SUBCUTANEOUS
  Filled 2019-06-08 (×9): qty 1

## 2019-06-08 MED ORDER — PANTOPRAZOLE SODIUM 40 MG PO TBEC
40.0000 mg | DELAYED_RELEASE_TABLET | Freq: Every day | ORAL | Status: DC
  Administered 2019-06-09 – 2019-06-11 (×3): 40 mg via ORAL
  Filled 2019-06-08 (×3): qty 1

## 2019-06-08 MED ORDER — INSULIN ASPART 100 UNIT/ML ~~LOC~~ SOLN
5.0000 [IU] | Freq: Once | SUBCUTANEOUS | Status: DC
  Filled 2019-06-08: qty 1

## 2019-06-08 MED ORDER — SODIUM CHLORIDE 0.9 % IV SOLN
1.0000 g | Freq: Once | INTRAVENOUS | Status: AC
  Administered 2019-06-08: 14:00:00 1 g via INTRAVENOUS
  Filled 2019-06-08: qty 10

## 2019-06-08 MED ORDER — ACETAMINOPHEN 325 MG PO TABS: 650.0000 mg | ORAL_TABLET | Freq: Four times a day (QID) | ORAL | Status: DC | PRN

## 2019-06-08 MED ORDER — SODIUM ZIRCONIUM CYCLOSILICATE 10 G PO PACK
10.0000 g | PACK | Freq: Once | ORAL | Status: AC
  Administered 2019-06-08: 20:00:00 10 g via ORAL
  Filled 2019-06-08: qty 1

## 2019-06-08 MED ORDER — FOLIC ACID 1 MG PO TABS
1.0000 mg | ORAL_TABLET | Freq: Every day | ORAL | Status: DC
  Administered 2019-06-09 – 2019-06-11 (×3): 1 mg via ORAL
  Filled 2019-06-08 (×3): qty 1

## 2019-06-08 MED ORDER — SODIUM CHLORIDE 0.9 % IV SOLN
1000.0000 mL | INTRAVENOUS | Status: DC
  Administered 2019-06-08: 1000 mL via INTRAVENOUS

## 2019-06-08 MED ORDER — SODIUM CHLORIDE 0.9 % IV SOLN
INTRAVENOUS | Status: AC
  Administered 2019-06-08 – 2019-06-09 (×2): via INTRAVENOUS

## 2019-06-08 MED ORDER — VITAMIN B-1 100 MG PO TABS
100.0000 mg | ORAL_TABLET | Freq: Every day | ORAL | Status: DC
  Administered 2019-06-09 – 2019-06-11 (×3): 100 mg via ORAL
  Filled 2019-06-08 (×3): qty 1

## 2019-06-08 MED ORDER — SODIUM BICARBONATE 8.4 % IV SOLN
50.0000 meq | Freq: Once | INTRAVENOUS | Status: AC
  Administered 2019-06-08: 15:00:00 50 meq via INTRAVENOUS

## 2019-06-08 MED ORDER — SODIUM CHLORIDE 0.9 % IV SOLN
1.0000 g | INTRAVENOUS | Status: DC
  Administered 2019-06-09: 1 g via INTRAVENOUS
  Filled 2019-06-08: qty 10

## 2019-06-08 MED ORDER — ONDANSETRON HCL 4 MG PO TABS: 4.0000 mg | ORAL_TABLET | Freq: Four times a day (QID) | ORAL | Status: DC | PRN

## 2019-06-08 MED ORDER — SODIUM BICARBONATE 8.4 % IV SOLN
INTRAVENOUS | Status: AC
  Administered 2019-06-08: 15:00:00 50 meq via INTRAVENOUS
  Filled 2019-06-08: qty 50

## 2019-06-08 MED ORDER — SODIUM CHLORIDE 0.9 % IV SOLN: INTRAVENOUS | Status: DC

## 2019-06-08 MED ORDER — POLYETHYLENE GLYCOL 3350 17 G PO PACK: 17.0000 g | PACK | Freq: Every day | ORAL | Status: DC | PRN

## 2019-06-08 MED ORDER — ATORVASTATIN CALCIUM 40 MG PO TABS
40.0000 mg | ORAL_TABLET | Freq: Every day | ORAL | Status: DC
  Administered 2019-06-09 – 2019-06-10 (×2): 40 mg via ORAL
  Filled 2019-06-08 (×2): qty 1

## 2019-06-08 MED ORDER — ACETAMINOPHEN 650 MG RE SUPP: 650.0000 mg | Freq: Four times a day (QID) | RECTAL | Status: DC | PRN

## 2019-06-08 NOTE — Care Management Obs Status (Signed)
[place] NOTIFICATION   Patient Details  Name: [name] MRN: [id number] Date of Birth: 09-24-1945   Medicare Observation Status Notification Given:  Yes    Trish Mage, LCSW 06/08/2019, 4:03 PM

## 2019-06-08 NOTE — Progress Notes (Signed)
Pt arrived to room from ED via stretcher accompanied by sister. IVF infusing. Pt with no complaint. Urinal at bedside.

## 2019-06-08 NOTE — H&P (Addendum)
History and Physical    [name] XIP:[id number] DOB: 17-Aug-1946 DOA: 06/08/2019  PCP: Sinda Du, MD   Patient coming from: Home  I have personally briefly reviewed patient's old medical records in [place]  Chief Complaint: Right Ankle Pain  HPI: [name] is a 73 y.o. male with medical history significant for coronary artery disease with systolic CHF, cirrhosis, gastric ulcer, CKD, presented to the ED with complaints of poor p.o. intake generalized weakness and right ankle pain.  Patient tells me his right ankle has been bothering him intermittently over the past 1 to 2 years at least, and is unchanged.  He denies swelling of his ankle, redness or warmth.  No fevers.  Reports poor p.o. intake from poor appetite over the past 2 weeks. He also reports weight loss.  Per intake notes patient also complained of constipation, but he tells me his last bowel movement was 1 to 2 days ago.  He otherwise denies any other complaints.  No pain with urination of frequency, no cough or difficulty breathing, no vomiting no loose stools.  No black stools, no blood in stools.  He denies taking fluid pills and does not know if he is on lisinopril.  His sister helps him with his medications.  Patient was referred to the ED by primary care provider.  ED Course: Systolic blood pressure 67/34 improved to 128/63 with IV fluids.  Temperature 97.3.  UA large leukocytes rare bacteria.  Creatinine elevated 5.94 from baseline 1.2-2.1.  Potassium 5.9.  Chest x-ray negative for acute abnormality.  Lactic acid 2.1 >> 1.1.   577ml normal saline bolus and IV ceftriaxone given in ED.  Hospitalist to admit for acute on chronic CKD, and urinary tract infection.  Review of Systems: As per HPI all other systems reviewed and negative.  Past Medical History:  Diagnosis Date  . Arteriosclerotic cardiovascular disease (ASCVD)    IMI in 5/07 with CTO mid cx, 60% LAD, 99% RCA-> BMS; mod. impaired LV  function; EF:35-40% 2/10  . Chronic kidney disease    STAGE 3-4-creatinine 2.5 in 2009; 1.7 in 10/2008  . Cirrhosis ([place])    History of excessive alcohol use; continuing social use  . Gastroesophageal reflux disease   . Hyperlipidemia   . Tobacco abuse    60 pack years; one pack per day    Past Surgical History:  Procedure Laterality Date  . BIOPSY  12/05/2018   Procedure: BIOPSY;  Surgeon: Rogene Houston, MD;  [facility];  Service: Endoscopy;;  gastric  . ESOPHAGOGASTRODUODENOSCOPY N/A 12/19/2016   Procedure: ESOPHAGOGASTRODUODENOSCOPY (EGD);  Surgeon: Rogene Houston, MD;  [facility];  Service: Endoscopy;  Laterality: N/A;  . ESOPHAGOGASTRODUODENOSCOPY N/A 12/21/2016   Procedure: ESOPHAGOGASTRODUODENOSCOPY (EGD);  Surgeon: Danie Binder, MD;  [facility];  Service: Endoscopy;  Laterality: N/A;  . ESOPHAGOGASTRODUODENOSCOPY (EGD) WITH PROPOFOL N/A 12/22/2016   Procedure: ESOPHAGOGASTRODUODENOSCOPY (EGD) WITH PROPOFOL;  Surgeon: Mauri Pole, MD;  Location: [place] [facility];  Service: Endoscopy;  Laterality: N/A;  . ESOPHAGOGASTRODUODENOSCOPY (EGD) WITH PROPOFOL N/A 12/05/2018   Procedure: ESOPHAGOGASTRODUODENOSCOPY (EGD) WITH PROPOFOL;  Surgeon: Rogene Houston, MD;  [facility];  Service: Endoscopy;  Laterality: N/A;  1:35     reports that he has been smoking. He has a 60.00 pack-year smoking history. He has never used smokeless tobacco. He reports current alcohol use. He reports that he does not use drugs.  No Known Allergies  Family History  Problem Relation Age of Onset  . Other Brother     Prior to Admission medications   Medication Sig Start Date End Date Taking? Authorizing Provider  atorvastatin (LIPITOR) 40 MG tablet Take 40 mg by mouth daily at 6 PM.  04/02/17  Yes [provider]  carvedilol (COREG) 12.5 MG tablet TAKE 1 TABLET BY MOUTH TWICE DAILY. Patient taking differently: Take 12.5 mg by mouth 2 (two) times  daily with a meal.  07/07/14  Yes Herminio Commons, MD  folic acid (FOLVITE) 1 MG tablet Take 1 mg by mouth daily.   Yes [provider]  furosemide (LASIX) 40 MG tablet Take 40 mg by mouth daily.  04/02/17  Yes [provider]  lisinopril (ZESTRIL) 10 MG tablet Take 10 mg by mouth daily.  06/01/19  Yes [provider]  magnesium oxide (MAG-OX) 400 (241.3 Mg) MG tablet Take 1 tablet (400 mg total) by mouth daily. 12/27/16  Yes Reyne Dumas, MD  methylPREDNISolone (MEDROL DOSEPAK) 4 MG TBPK tablet Take 4 mg by mouth as directed.  06/04/19  Yes [provider]  Multiple Vitamins-Minerals (MULTIVITAMIN WITH MINERALS) tablet Take 1 tablet by mouth daily.   Yes [provider]  nitroGLYCERIN (NITROSTAT) 0.4 MG SL tablet Place 0.4 mg under the tongue as needed.     Yes [provider]  pantoprazole (PROTONIX) 40 MG tablet TAKE 1 TABLET BY MOUTH ONCE DAILY BEFORE BREAKFAST. Patient taking differently: Take 40 mg by mouth daily.  04/20/19  Yes Rehman, Mechele Dawley, MD  thiamine 100 MG tablet Take 1 tablet (100 mg total) by mouth daily. Patient taking differently: Take 50 mg by mouth daily.  12/27/16  Yes Reyne Dumas, MD  colchicine 0.6 MG tablet Take 1 tablet (0.6 mg total) by mouth 2 (two) times daily. 12/26/16 11/27/18  Reyne Dumas, MD    Physical Exam: Vitals:   06/08/19 1300 06/08/19 1330 06/08/19 1400 06/08/19 1430  BP: 90/66 (!) 110/51 (!) 110/54 128/63  Pulse:  (!) 56 (!) 51 71  Resp: 13 11 16 18   Temp:      TempSrc:      SpO2:  100% 100% 100%  Weight:      Height:        Constitutional: NAD, calm, comfortable, thin Vitals:   06/08/19 1300 06/08/19 1330 06/08/19 1400 06/08/19 1430  BP: 90/66 (!) 110/51 (!) 110/54 128/63  Pulse:  (!) 56 (!) 51 71  Resp: 13 11 16 18   Temp:      TempSrc:      SpO2:  100% 100% 100%  Weight:      Height:       Eyes: PERRL, lids and conjunctivae normal ENMT: Mucous membranes are dry. Posterior  pharynx clear of any exudate or lesions.  Neck: normal, supple, no masses, no thyromegaly Respiratory: clear to auscultation bilaterally, no wheezing, no crackles. Normal respiratory effort. No accessory muscle use.  Cardiovascular: Bradycardic, Regular rate and rhythm, no murmurs / rubs / gallops. No extremity edema. 2+ pedal pulses.  Abdomen: no tenderness, no masses palpated. No hepatosplenomegaly. Bowel sounds positive.  Musculoskeletal: no clubbing / cyanosis. No joint deformity upper and lower extremities. Good ROM, no contractures. Normal muscle tone.  Right ankle-no tenderness, swelling or erythema, mild differential warmth compared to left, able to move both ankles in all directions without limitation of movement or pain. Skin: no rashes, lesions, ulcers. No induration Neurologic: CN 2-12 grossly intact.Strength 5/5 in all 4.  Psychiatric: Normal judgment  and insight. Alert and oriented x 3. Normal mood.   Labs on Admission: I have personally reviewed following labs and imaging studies  CBC: Recent Labs  Lab 06/08/19 1158  WBC 9.6  NEUTROABS 7.4  HGB 9.0*  HCT 28.1*  MCV 95.6  PLT 073   Basic Metabolic Panel: Recent Labs  Lab 06/08/19 1158  NA 137  K 5.9*  CL 99  CO2 25  GLUCOSE 153*  BUN 84*  CREATININE 5.94*  CALCIUM 9.0   Liver Function Tests: Recent Labs  Lab 06/08/19 1158  AST 25  ALT 20  ALKPHOS 59  BILITOT 0.3  PROT 8.4*  ALBUMIN 3.4*   Recent Labs  Lab 06/08/19 1158  LIPASE 47   Urine analysis:    Component Value Date/Time   COLORURINE AMBER (A) 06/08/2019 1132   APPEARANCEUR CLOUDY (A) 06/08/2019 1132   LABSPEC 1.012 06/08/2019 1132   PHURINE 5.0 06/08/2019 1132   GLUCOSEU NEGATIVE 06/08/2019 1132   HGBUR NEGATIVE 06/08/2019 1132   BILIRUBINUR NEGATIVE 06/08/2019 1132   KETONESUR NEGATIVE 06/08/2019 1132   PROTEINUR NEGATIVE 06/08/2019 1132   NITRITE NEGATIVE 06/08/2019 1132   LEUKOCYTESUR LARGE (A) 06/08/2019 1132    Radiological  Exams on Admission: Dg Chest Portable 1 View  Result Date: 06/08/2019 CLINICAL DATA:  Failure to thrive EXAM: PORTABLE CHEST 1 VIEW COMPARISON:  Chest CT dated 11/05/2018. FINDINGS: The heart size and mediastinal contours are within normal limits. Calcifications are seen in the aortic arch. Both lungs are clear. There is cortical thickening and increased density of the posteromedial right ninth rib and the posteromedial left tenth rib. IMPRESSION: No active disease. Electronically Signed   By: Zerita Boers M.D.   On: 06/08/2019 13:32    EKG: Independently reviewed.  Sinus rhythm, nonspecific T wave changes diffusely mostly old but new in lead V5, III.  Assessment/Plan Active Problems:   AKI (acute kidney injury) ([place])   Acute on chronic kidney injury-creatinine 5.94 markedly elevated from baseline 1.2- 2.1.  With initial hypotension (systolic 89) and lactic acidosis, suggesting prerenal etiology for AKI from poor p.o. intake, ??  Lisinopril and Lasix use.  Home med lists Lasix and lisinopril, he is unaware he takes them.  UA negative for blood or protein.  500 bolus given in the ED -Gentle hydration reduce rate 100 >> 75 cc/h, with Ef- 15 - 20%. -BMP a.m. -Hold home lisinopril, ?  Lasix listed on home med lists  Probable UTI-UA suggestive with large leukocytes greater than 50 WBC.  With generalized weakness and poor p.o. intake.  WBC 9.6.  Rules out for sepsis.. -IV ceftriaxone 1 g daily -Follow-up urine cultures  Systolic CHF-stable and compensated, 2018 echo shows EF 15 to 20%. -Hold Coreg for now with initial hypotension resume home Coreg 12.5 twice daily in a.m. if blood pressure stable -Hold Lasix, lisinopril.  Lactic acidosis-2.1 >> 1.1.  Likely from volume contraction, dehydration.  Improved with 500 mill bolus.  Hyperkalemia- potassium 5.9.  D-50, NovoLog, sodium bicarbonate given.  Likely due to AKI. -BMP a.m. -Lokelma x1  Chronic anemia, weight loss-hemoglobin 9, gradual  downtrend from baseline ~ 12  one year ago.  History of peptic ulcer disease.  Denies melena.  Follows with Dr. Laural Golden.  Multiple EGDs, most recent 11/2018-for weight loss- pathology showing chronic gastritis with intestinal metaplasia otherwise unremarkable. I do not see Colonoscopy results.  - Iron panel in a.m. - CBC a.m - PT eval  History of coronary artery disease-stable.  EKG with  nonspecific changes.  No chest pain. -Resume home atorvastatin, - Hold carvedilol,lisinopril  Right ankle pain-chronic, intermittent and unchanged.  Exam is benign.   - Xray right ankle.  History of alcohol abuse-denies alcohol use in a long time. -Blood alcohol level -Check magnesium -Resume home folic acid and thiamine   DVT prophylaxis: Heparin Code Status: Full Family Communication: None at bedside Disposition Plan: Per rounding team Consults called: None Admission status: Obs, tele    Bethena Roys MD [facility]  06/08/2019, 4:34 PM

## 2019-06-08 NOTE — ED Notes (Signed)
Pt was informed we need urine sample, urinal at bedside.

## 2019-06-08 NOTE — ED Provider Notes (Signed)
[facility] EMERGENCY DEPARTMENT Provider Note   CSN: [id number] Arrival date & time: 06/08/19  1026     History   Chief Complaint Chief Complaint  Patient presents with  . Foot Pain    right    HPI [name] is a 73 y.o. male.     Patient is a 73 year old male who presents to the emergency department with a complaint of left foot pain and failure to thrive.  The history is obtained primarily from family and previous medical records.  Patient has a history of chronic kidney disease, large gastric ulcer, ischemic cardiomyopathy with ejection fraction of 20 to 25%, history of alcohol abuse, coronary artery disease, anemia, hypotension and weakness.  Family reports that for the last 3 days the patient has been having pain from the ankle up to the thigh.  The patient says that it is similar to previous gout problems that he has had.  That he has been on prednisone, but this does not seem to be helping matters any.  The patient has severe pain when he applies pressure to the area.  The family also reports that the patient has not been eating well over the past 2 weeks.  Family report the patient has been more weak than usual.  There is been problems with constipation, as well as losing weight.  There is been no vomiting, no blood in the stools that is been reported, no fever or chills reported.  As noted patient is not eating or drinking well.  This has been discussed with Dr. Luan Pulling.  He requested the patient come over to the emergency department for evaluation and possible admission.  The history is provided by the patient.  Foot Pain Pertinent negatives include no chest pain, no abdominal pain and no shortness of breath.    Past Medical History:  Diagnosis Date  . Arteriosclerotic cardiovascular disease (ASCVD)    IMI in 5/07 with CTO mid cx, 60% LAD, 99% RCA-> BMS; mod. impaired LV function; EF:35-40% 2/10  . Chronic kidney disease    STAGE 3-4-creatinine 2.5 in  2009; 1.7 in 10/2008  . Cirrhosis ([place])    History of excessive alcohol use; continuing social use  . Gastroesophageal reflux disease   . Hyperlipidemia   . Tobacco abuse    60 pack years; one pack per day    Patient Active Problem List   Diagnosis Date Noted  . Gastric ulcer without hemorrhage or perforation 11/21/2018  . Hematochezia   . Chronic systolic congestive heart failure ([place])   . Hemorrhagic shock and encephalopathy syndrome ([place])   . Acute upper GI bleed   . Gastric ulcer 12/20/2016  . Acute blood loss anemia 12/20/2016  . Hypotension 12/18/2016  . Volume depletion 12/18/2016  . Ischemic cardiomyopathy 12/18/2016  . Anemia 12/18/2016  . Heme + stool 12/18/2016  . Dehydration   . Thrombocytopenia ([place])   . Malnutrition of moderate degree 12/15/2016  . AKI (acute kidney injury) ([place]) 12/13/2016  . Unintentional weight loss 12/13/2016  . Alcohol dependence ([place]) 12/13/2016  . Hyperglycemia 12/13/2016  . Elevated troponin 12/13/2016  . Anemia, normocytic normochromic 03/28/2012  . Coronary artery disease   . Gastroesophageal reflux disease   . Chronic kidney disease   . Cirrhosis ([place])   . Tobacco abuse     Past Surgical History:  Procedure Laterality Date  . BIOPSY  12/05/2018   Procedure: BIOPSY;  Surgeon: Rogene Houston, MD;  Location: [facility];  Service: Endoscopy;;  gastric  . ESOPHAGOGASTRODUODENOSCOPY N/A 12/19/2016   Procedure: ESOPHAGOGASTRODUODENOSCOPY (EGD);  Surgeon: Rogene Houston, MD;  Location: [facility];  Service: Endoscopy;  Laterality: N/A;  . ESOPHAGOGASTRODUODENOSCOPY N/A 12/21/2016   Procedure: ESOPHAGOGASTRODUODENOSCOPY (EGD);  Surgeon: Danie Binder, MD;  Location: [facility];  Service: Endoscopy;  Laterality: N/A;  . ESOPHAGOGASTRODUODENOSCOPY (EGD) WITH PROPOFOL N/A 12/22/2016   Procedure: ESOPHAGOGASTRODUODENOSCOPY (EGD) WITH PROPOFOL;  Surgeon: Mauri Pole, MD;  Location: [facility];  Service: Endoscopy;  Laterality:  N/A;  . ESOPHAGOGASTRODUODENOSCOPY (EGD) WITH PROPOFOL N/A 12/05/2018   Procedure: ESOPHAGOGASTRODUODENOSCOPY (EGD) WITH PROPOFOL;  Surgeon: Rogene Houston, MD;  [facility]: [facility];  Service: Endoscopy;  Laterality: N/A;  1:35        Home Medications    Prior to Admission medications   Medication Sig Start Date End Date Taking? Authorizing Provider  atorvastatin (LIPITOR) 40 MG tablet Take 40 mg by mouth daily at 6 PM.  04/02/17   [provider]  carvedilol (COREG) 12.5 MG tablet TAKE 1 TABLET BY MOUTH TWICE DAILY. Patient taking differently: Take 12.5 mg by mouth 2 (two) times daily with a meal.  07/07/14   Herminio Commons, MD  colchicine 0.6 MG tablet Take 1 tablet (0.6 mg total) by mouth 2 (two) times daily. 12/26/16 11/27/18  Reyne Dumas, MD  furosemide (LASIX) 40 MG tablet Take 40 mg by mouth daily.  04/02/17   [provider]  magnesium oxide (MAG-OX) 400 (241.3 Mg) MG tablet Take 1 tablet (400 mg total) by mouth daily. 12/27/16   Reyne Dumas, MD  nitroGLYCERIN (NITROSTAT) 0.4 MG SL tablet Place 0.4 mg under the tongue as needed.      [provider]  pantoprazole (PROTONIX) 40 MG tablet TAKE 1 TABLET BY MOUTH ONCE DAILY BEFORE BREAKFAST. 04/20/19   Rehman, Mechele Dawley, MD  thiamine 100 MG tablet Take 1 tablet (100 mg total) by mouth daily. 12/27/16   Reyne Dumas, MD    Family History Family History  Problem Relation Age of Onset  . Other Brother     Social History Social History   Tobacco Use  . Smoking status: Current Every Day Smoker    Packs/day: 1.00    Years: 60.00    Pack years: 60.00  . Smokeless tobacco: Never Used  Substance Use Topics  . Alcohol use: Yes    Comment: per sister "he had one beer last week"  . Drug use: No     Allergies   Patient has no known allergies.   Review of Systems Review of Systems  Constitutional: Positive for appetite change. Negative for activity change and fever.       All ROS Neg except  as noted in HPI  HENT: Negative for nosebleeds.   Eyes: Negative for photophobia and discharge.  Respiratory: Negative for cough, shortness of breath and wheezing.   Cardiovascular: Negative for chest pain and palpitations.  Gastrointestinal: Positive for constipation. Negative for abdominal pain, blood in stool, diarrhea, nausea and vomiting.  Genitourinary: Negative for dysuria, frequency and hematuria.  Musculoskeletal: Positive for arthralgias. Negative for back pain and neck pain.  Skin: Negative.   Neurological: Positive for weakness. Negative for dizziness, seizures and speech difficulty.  Psychiatric/Behavioral: Negative for confusion and hallucinations.     Physical Exam Updated Vital Signs BP (!) 110/54   Pulse (!) 51   Temp 97.8 F (36.6 C) (Oral)   Resp 16   Ht 5\' 6"  (1.676 m)   Wt 63.5  kg   SpO2 100%   BMI 22.60 kg/m   Physical Exam Vitals signs and nursing note reviewed.  Constitutional:      General: He is not in acute distress.    Appearance: He is well-developed. He is not toxic-appearing or diaphoretic.     Comments: Patient is a thin, cachectic male.  HENT:     Head: Normocephalic.     Right Ear: Tympanic membrane and external ear normal.     Left Ear: Tympanic membrane and external ear normal.     Mouth/Throat:     Mouth: Mucous membranes are dry.  Eyes:     General: Lids are normal. No scleral icterus.    Pupils: Pupils are equal, round, and reactive to light.  Neck:     Musculoskeletal: Normal range of motion and neck supple.     Vascular: No carotid bruit.  Cardiovascular:     Rate and Rhythm: Regular rhythm. Bradycardia present.     Pulses: Normal pulses.     Heart sounds: Normal heart sounds.  Pulmonary:     Effort: No respiratory distress.     Breath sounds: Normal breath sounds.  Abdominal:     General: Bowel sounds are normal.     Palpations: Abdomen is soft.     Tenderness: There is no abdominal tenderness. There is no guarding.   Musculoskeletal: Normal range of motion.       Feet:  Feet:     Comments: Moderate increased tenderness to palpation and also with flexion extension of the right ankle.  No hot joints ankle, knee, or hip appreciated. Lymphadenopathy:     Head:     Right side of head: No submandibular adenopathy.     Left side of head: No submandibular adenopathy.     Cervical: No cervical adenopathy.  Skin:    General: Skin is warm and dry.     Comments: Poor skin turgor  Neurological:     Mental Status: He is alert and oriented to person, place, and time.     Cranial Nerves: No cranial nerve deficit.     Sensory: No sensory deficit.  Psychiatric:        Speech: Speech normal.      ED Treatments / Results  Labs (all labs ordered are listed, but only abnormal results are displayed) Labs Reviewed  COMPREHENSIVE METABOLIC PANEL - Abnormal; Notable for the following components:      Result Value   Potassium 5.9 (*)    Glucose, Bld 153 (*)    BUN 84 (*)    Creatinine, Ser 5.94 (*)    Total Protein 8.4 (*)    Albumin 3.4 (*)    GFR calc non Af Amer 9 (*)    GFR calc Af Amer 10 (*)    All other components within normal limits  LACTIC ACID, PLASMA - Abnormal; Notable for the following components:   Lactic Acid, Venous 2.1 (*)    All other components within normal limits  CBC WITH DIFFERENTIAL/PLATELET - Abnormal; Notable for the following components:   RBC 2.94 (*)    Hemoglobin 9.0 (*)    HCT 28.1 (*)    All other components within normal limits  URINALYSIS, ROUTINE W REFLEX MICROSCOPIC - Abnormal; Notable for the following components:   Color, Urine AMBER (*)    APPearance CLOUDY (*)    Leukocytes,Ua LARGE (*)    WBC, UA >50 (*)    Bacteria, UA RARE (*)    All  other components within normal limits  URIC ACID - Abnormal; Notable for the following components:   Uric Acid, Serum 15.0 (*)    All other components within normal limits  TROPONIN I (HIGH SENSITIVITY) - Abnormal; Notable  for the following components:   Troponin I (High Sensitivity) 35 (*)    All other components within normal limits  URINE CULTURE  SARS CORONAVIRUS 2 (HOSPITAL ORDER, PERFORMED IN [place] [facility])  LIPASE, BLOOD  LACTIC ACID, PLASMA  SAMPLE TO BLOOD BANK  TROPONIN I (HIGH SENSITIVITY)    EKG None  Radiology Dg Chest Portable 1 View  Result Date: 06/08/2019 CLINICAL DATA:  Failure to thrive EXAM: PORTABLE CHEST 1 VIEW COMPARISON:  Chest CT dated 11/05/2018. FINDINGS: The heart size and mediastinal contours are within normal limits. Calcifications are seen in the aortic arch. Both lungs are clear. There is cortical thickening and increased density of the posteromedial right ninth rib and the posteromedial left tenth rib. IMPRESSION: No active disease. Electronically Signed   By: Zerita Boers M.D.   On: 06/08/2019 13:32    Procedures Procedures (including critical care time)  Medications Ordered in ED Medications  sodium chloride 0.9 % bolus 500 mL (0 mLs Intravenous Stopped 06/08/19 1238)    Followed by  0.9 %  sodium chloride infusion (1,000 mLs Intravenous New Bag/Given 06/08/19 1209)  cefTRIAXone (ROCEPHIN) 1 g in sodium chloride 0.9 % 100 mL IVPB (1 g Intravenous New Bag/Given 06/08/19 1425)  sodium bicarbonate injection 50 mEq (has no administration in time range)  dextrose 50 % solution 25 mL (has no administration in time range)  insulin aspart (novoLOG) injection 5 Units (has no administration in time range)     Initial Impression / Assessment and Plan / ED Course  I have reviewed the triage vital signs and the nursing notes.  Pertinent labs & imaging results that were available during my care of the patient were reviewed by me and considered in my medical decision making (see chart for details).          Final Clinical Impressions(s) / ED Diagnoses MDM  Blood pressure is low at 89 systolic.  IV fluids will be started cautiously.  Pulse oximetry is 100%  on room air.  Within normal limits by my interpretation.  The patient has poor skin turgor, and dry mucous membranes of the mouth.  He is awake and alert and does not appear to be in distress, but is a little lethargic.  The comprehensive metabolic panel shows the potassium to the elevated at 5.9, glucose elevated at 153, the BUN is 84, and the creatinine is 5.94, this is over twice the patient's most recent values.  The hepatic function studies are normal.  The anion gap is normal at 13. Lipase is 47.  The troponin is elevated at 35.  The lactic acid is 2.1.  As noted above patient is receiving IV fluids.  Recheck.  No complaint of pain.  Patient is awake and alert.  Blood pressure improved 948 systolic.  The complete blood count shows white blood cells to be within normal range at 9600.  The hemoglobin is low at 9, the hematocrit is low at 28.1.  This is in the patient's usual baseline range.  There is no shift to the left.  Uric acid is elevated at 15.  Urine analysis shows a cloudy amber specimen with a specific gravity of 1.012.  There are positive leukocyte esterase present there are 11-20 red blood cells  per high-powered field, and greater than 50 white blood cells per high-powered field, rare bacteria noted.  The culture of the urine has been sent to the lab.  Chest x-ray shows no active disease.  Patient has been seen with me by Dr. Wilson Singer.  Sodium bicarb, dextrose, and insulin have been ordered to assist with the elevated potassium.  We will discussed the case with hospitalist for admission due to acute on chronic kidney disease, urinary tract infection, and failure to thrive.   Final diagnoses:  Acute renal failure superimposed on stage 3 chronic kidney disease, unspecified acute renal failure type ([place])  Hyperkalemia  Urinary tract infection without hematuria, site unspecified    ED Discharge Orders    None       Lily Kocher, PA-C 06/08/19 1535    Virgel Manifold,  MD 06/09/19 [phone]

## 2019-06-08 NOTE — ED Notes (Signed)
CRITICAL VALUE ALERT  Critical Value:  Lactic Acid 2.1  Date & Time Notied:  06/08/19 1302  Provider Notified: Lily Kocher, PA  Orders Received/Actions taken: EDP notified, no further orders given

## 2019-06-08 NOTE — ED Triage Notes (Signed)
Patient presents to the ED as recommended by family member stating they were advised by PCP Dr. Luan Pulling.  Patient complains of right foot pain, but cannot give details if there was injury or a fall.

## 2019-06-09 ENCOUNTER — Inpatient Hospital Stay (HOSPITAL_COMMUNITY): Payer: Medicare Other

## 2019-06-09 ENCOUNTER — Other Ambulatory Visit: Payer: Self-pay

## 2019-06-09 DIAGNOSIS — K219 Gastro-esophageal reflux disease without esophagitis: Secondary | ICD-10-CM | POA: Diagnosis present

## 2019-06-09 DIAGNOSIS — R627 Adult failure to thrive: Secondary | ICD-10-CM | POA: Diagnosis present

## 2019-06-09 DIAGNOSIS — K7460 Unspecified cirrhosis of liver: Secondary | ICD-10-CM | POA: Diagnosis present

## 2019-06-09 DIAGNOSIS — E440 Moderate protein-calorie malnutrition: Secondary | ICD-10-CM | POA: Diagnosis present

## 2019-06-09 DIAGNOSIS — K5900 Constipation, unspecified: Secondary | ICD-10-CM | POA: Diagnosis present

## 2019-06-09 DIAGNOSIS — E872 Acidosis: Secondary | ICD-10-CM | POA: Diagnosis present

## 2019-06-09 DIAGNOSIS — N179 Acute kidney failure, unspecified: Secondary | ICD-10-CM | POA: Diagnosis present

## 2019-06-09 DIAGNOSIS — S99921A Unspecified injury of right foot, initial encounter: Secondary | ICD-10-CM | POA: Diagnosis not present

## 2019-06-09 DIAGNOSIS — R4189 Other symptoms and signs involving cognitive functions and awareness: Secondary | ICD-10-CM | POA: Diagnosis present

## 2019-06-09 DIAGNOSIS — Z79899 Other long term (current) drug therapy: Secondary | ICD-10-CM | POA: Diagnosis not present

## 2019-06-09 DIAGNOSIS — M25571 Pain in right ankle and joints of right foot: Secondary | ICD-10-CM | POA: Diagnosis present

## 2019-06-09 DIAGNOSIS — I5022 Chronic systolic (congestive) heart failure: Secondary | ICD-10-CM | POA: Diagnosis present

## 2019-06-09 DIAGNOSIS — I2510 Atherosclerotic heart disease of native coronary artery without angina pectoris: Secondary | ICD-10-CM | POA: Diagnosis present

## 2019-06-09 DIAGNOSIS — N390 Urinary tract infection, site not specified: Secondary | ICD-10-CM | POA: Diagnosis not present

## 2019-06-09 DIAGNOSIS — Z20828 Contact with and (suspected) exposure to other viral communicable diseases: Secondary | ICD-10-CM | POA: Diagnosis present

## 2019-06-09 DIAGNOSIS — E875 Hyperkalemia: Secondary | ICD-10-CM | POA: Diagnosis present

## 2019-06-09 DIAGNOSIS — N183 Chronic kidney disease, stage 3 (moderate): Secondary | ICD-10-CM | POA: Diagnosis present

## 2019-06-09 DIAGNOSIS — Z8711 Personal history of peptic ulcer disease: Secondary | ICD-10-CM | POA: Diagnosis not present

## 2019-06-09 DIAGNOSIS — E785 Hyperlipidemia, unspecified: Secondary | ICD-10-CM | POA: Diagnosis present

## 2019-06-09 DIAGNOSIS — E860 Dehydration: Secondary | ICD-10-CM | POA: Diagnosis not present

## 2019-06-09 DIAGNOSIS — K2950 Unspecified chronic gastritis without bleeding: Secondary | ICD-10-CM | POA: Diagnosis present

## 2019-06-09 DIAGNOSIS — D649 Anemia, unspecified: Secondary | ICD-10-CM | POA: Diagnosis present

## 2019-06-09 DIAGNOSIS — N189 Chronic kidney disease, unspecified: Secondary | ICD-10-CM | POA: Diagnosis present

## 2019-06-09 DIAGNOSIS — M79671 Pain in right foot: Secondary | ICD-10-CM | POA: Diagnosis not present

## 2019-06-09 DIAGNOSIS — Z6822 Body mass index (BMI) 22.0-22.9, adult: Secondary | ICD-10-CM | POA: Diagnosis not present

## 2019-06-09 DIAGNOSIS — I959 Hypotension, unspecified: Secondary | ICD-10-CM | POA: Diagnosis present

## 2019-06-09 DIAGNOSIS — J449 Chronic obstructive pulmonary disease, unspecified: Secondary | ICD-10-CM | POA: Diagnosis present

## 2019-06-09 LAB — URINE CULTURE: Culture: NO GROWTH

## 2019-06-09 LAB — CBC
HCT: 27 % — ABNORMAL LOW (ref 39.0–52.0)
Hemoglobin: 8.6 g/dL — ABNORMAL LOW (ref 13.0–17.0)
MCH: 30.5 pg (ref 26.0–34.0)
MCHC: 31.9 g/dL (ref 30.0–36.0)
MCV: 95.7 fL (ref 80.0–100.0)
Platelets: 131 10*3/uL — ABNORMAL LOW (ref 150–400)
RBC: 2.82 MIL/uL — ABNORMAL LOW (ref 4.22–5.81)
RDW: 13.6 % (ref 11.5–15.5)
WBC: 6.8 10*3/uL (ref 4.0–10.5)
nRBC: 0 % (ref 0.0–0.2)

## 2019-06-09 LAB — IRON AND TIBC
Iron: 53 ug/dL (ref 45–182)
Saturation Ratios: 27 % (ref 17.9–39.5)
TIBC: 194 ug/dL — ABNORMAL LOW (ref 250–450)
UIBC: 141 ug/dL

## 2019-06-09 LAB — BASIC METABOLIC PANEL
Anion gap: 7 (ref 5–15)
BUN: 65 mg/dL — ABNORMAL HIGH (ref 8–23)
CO2: 25 mmol/L (ref 22–32)
Calcium: 7.9 mg/dL — ABNORMAL LOW (ref 8.9–10.3)
Chloride: 108 mmol/L (ref 98–111)
Creatinine, Ser: 3.38 mg/dL — ABNORMAL HIGH (ref 0.61–1.24)
GFR calc Af Amer: 20 mL/min — ABNORMAL LOW (ref >60)
GFR calc non Af Amer: 17 mL/min — ABNORMAL LOW (ref >60)
Glucose, Bld: 97 mg/dL (ref 70–99)
Potassium: 4.4 mmol/L (ref 3.5–5.1)
Sodium: 140 mmol/L (ref 135–145)

## 2019-06-09 LAB — FERRITIN: Ferritin: 247 ng/mL (ref 24–336)

## 2019-06-09 MED ORDER — SODIUM CHLORIDE 0.9 % IV SOLN
INTRAVENOUS | Status: AC
  Administered 2019-06-10 (×2): via INTRAVENOUS

## 2019-06-09 NOTE — Progress Notes (Signed)
Subjective: He says he feels okay.  He is complaining of ankle pain but that is actually been going on for about 2 years.  He has been to my office on multiple occasions in the last several months.  He has been losing weight and this has been worked up extensively including multiple blood draws and CT abdomen pelvis and chest with no cause of weight loss being found.  He is also been following with GI because of chronic anemia and gastritis.  His appetite has generally been poor.  He has some cognitive dysfunction and his sister provides most of his care  Objective: Vital signs in last 24 hours: Temp:  [97.7 F (36.5 C)-98 F (36.7 C)] 97.7 F (36.5 C) (09/22 0513) Pulse Rate:  [51-71] 70 (09/22 0513) Resp:  [11-18] 16 (09/22 0513) BP: (89-128)/(47-87) 98/50 (09/22 0513) SpO2:  [99 %-100 %] 99 % (09/22 0812) Weight:  [63.5 kg] 63.5 kg (09/21 1035) Weight change:     Intake/Output from previous day: 09/21 0701 - 09/22 0700 In: 1799.4 [I.V.:1199.9; IV Piggyback:599.5] Out: 350 [Urine:350]  PHYSICAL EXAM General appearance: alert, cooperative and no distress Resp: clear to auscultation bilaterally Cardio: regular rate and rhythm, S1, S2 normal, no murmur, click, rub or gallop GI: soft, non-tender; bowel sounds normal; no masses,  no organomegaly Extremities: extremities normal, atraumatic, no cyanosis or edema  Lab Results:  Results for orders placed or performed during the hospital encounter of 06/08/19 (from the past 48 hour(s))  Urinalysis, Routine w reflex microscopic     Status: Abnormal   Collection Time: 06/08/19 11:32 AM  Result Value Ref Range   Color, Urine AMBER (A) YELLOW    Comment: BIOCHEMICALS MAY BE AFFECTED BY COLOR   APPearance CLOUDY (A) CLEAR   Specific Gravity, Urine 1.012 1.005 - 1.030   pH 5.0 5.0 - 8.0   Glucose, UA NEGATIVE NEGATIVE mg/dL   Hgb urine dipstick NEGATIVE NEGATIVE   Bilirubin Urine NEGATIVE NEGATIVE   Ketones, ur NEGATIVE NEGATIVE mg/dL    Protein, ur NEGATIVE NEGATIVE mg/dL   Nitrite NEGATIVE NEGATIVE   Leukocytes,Ua LARGE (A) NEGATIVE   RBC / HPF 11-20 0 - 5 RBC/hpf   WBC, UA >50 (H) 0 - 5 WBC/hpf   Bacteria, UA RARE (A) NONE SEEN   Squamous Epithelial / LPF 6-10 0 - 5   WBC Clumps PRESENT    Mucus PRESENT    Hyaline Casts, UA PRESENT     Comment: Performed at [facility], [address], [place], [place] [address]  Comprehensive metabolic panel     Status: Abnormal   Collection Time: 06/08/19 11:58 AM  Result Value Ref Range   Sodium 137 135 - 145 mmol/L   Potassium 5.9 (H) 3.5 - 5.1 mmol/L   Chloride 99 98 - 111 mmol/L   CO2 25 22 - 32 mmol/L   Glucose, Bld 153 (H) 70 - 99 mg/dL   BUN 84 (H) 8 - 23 mg/dL   Creatinine, Ser 5.94 (H) 0.61 - 1.24 mg/dL   Calcium 9.0 8.9 - 10.3 mg/dL   Total Protein 8.4 (H) 6.5 - 8.1 g/dL   Albumin 3.4 (L) 3.5 - 5.0 g/dL   AST 25 15 - 41 U/L   ALT 20 0 - 44 U/L   Alkaline Phosphatase 59 38 - 126 U/L   Total Bilirubin 0.3 0.3 - 1.2 mg/dL   GFR calc non Af Amer 9 (L) >60 mL/min   GFR calc Af Amer 10 (L) >60  mL/min   Anion gap 13 5 - 15    Comment: Performed at [facility], [address] [place], [place] [address]  Lipase, blood     Status: None   Collection Time: 06/08/19 11:58 AM  Result Value Ref Range   Lipase 47 11 - 51 U/L    Comment: Performed at [facility], [address] [place], [place] [address]  Troponin I (High Sensitivity)     Status: Abnormal   Collection Time: 06/08/19 11:58 AM  Result Value Ref Range   Troponin I (High Sensitivity) 35 (H) <18 ng/L    Comment: (NOTE) Elevated high sensitivity troponin I (hsTnI) values and significant  changes across serial measurements may suggest ACS but many other  chronic and acute conditions are known to elevate hsTnI results.  Refer to the "Links" section for chest pain algorithms and additional  guidance. Performed at [facility], [address], [place], [place] [address]   Lactic acid, plasma     Status: Abnormal    Collection Time: 06/08/19 11:58 AM  Result Value Ref Range   Lactic Acid, Venous 2.1 (HH) 0.5 - 1.9 mmol/L    Comment: CRITICAL RESULT CALLED TO, READ BACK BY AND VERIFIED WITH: WHITE,M AT 1302 ON 9.21.20 BY ISLEY,B Performed at [facility], [address] [place], [place] [address]   CBC with Differential     Status: Abnormal   Collection Time: 06/08/19 11:58 AM  Result Value Ref Range   WBC 9.6 4.0 - 10.5 K/uL   RBC 2.94 (L) 4.22 - 5.81 MIL/uL   Hemoglobin 9.0 (L) 13.0 - 17.0 g/dL   HCT 28.1 (L) 39.0 - 52.0 %   MCV 95.6 80.0 - 100.0 fL   MCH 30.6 26.0 - 34.0 pg   MCHC 32.0 30.0 - 36.0 g/dL   RDW 13.6 11.5 - 15.5 %   Platelets 160 150 - 400 K/uL   nRBC 0.0 0.0 - 0.2 %   Neutrophils Relative % 78 %   Neutro Abs 7.4 1.7 - 7.7 K/uL   Lymphocytes Relative 18 %   Lymphs Abs 1.8 0.7 - 4.0 K/uL   Monocytes Relative 4 %   Monocytes Absolute 0.4 0.1 - 1.0 K/uL   Eosinophils Relative 0 %   Eosinophils Absolute 0.0 0.0 - 0.5 K/uL   Basophils Relative 0 %   Basophils Absolute 0.0 0.0 - 0.1 K/uL   Immature Granulocytes 0 %   Abs Immature Granulocytes 0.02 0.00 - 0.07 K/uL    Comment: Performed at [facility], [address], [place], [place] [address]  Sample to Blood Bank     Status: None   Collection Time: 06/08/19 11:58 AM  Result Value Ref Range   Blood Bank Specimen SAMPLE AVAILABLE FOR TESTING    Sample Expiration      06/11/2019,2359 Performed at [facility], [address] [place], [place] [address]   Uric acid     Status: Abnormal   Collection Time: 06/08/19 11:58 AM  Result Value Ref Range   Uric Acid, Serum 15.0 (H) 3.7 - 8.6 mg/dL    Comment: Performed at [facility], [address], [place], [place] [address]  Ethanol     Status: None   Collection Time: 06/08/19 11:58 AM  Result Value Ref Range   Alcohol, Ethyl (B) <10 <10 mg/dL    Comment: (NOTE) Lowest detectable limit for serum alcohol is 10 mg/dL. For medical purposes only. Performed at [facility], [address] [place],  [place] [address]   Lactic acid, plasma     Status: None   Collection Time: 06/08/19  1:40 PM  Result Value Ref Range   Lactic Acid, Venous 1.1 0.5 - 1.9 mmol/L    Comment: Performed at [facility], [address] [place], [place] [address]  Troponin I (High Sensitivity)     Status: Abnormal   Collection Time: 06/08/19  1:40 PM  Result Value Ref Range   Troponin I (High Sensitivity) 31 (H) <18 ng/L    Comment: (NOTE) Elevated high sensitivity troponin I (hsTnI) values and significant  changes across serial measurements may suggest ACS but many other  chronic and acute conditions are known to elevate hsTnI results.  Refer to the "Links" section for chest pain algorithms and additional  guidance. Performed at [facility], [address] [place], [place] [address]   Magnesium     Status: Abnormal   Collection Time: 06/08/19  1:40 PM  Result Value Ref Range   Magnesium 3.0 (H) 1.7 - 2.4 mg/dL    Comment: Performed at [facility], [address] [place], [place] [address]  SARS Coronavirus 2 [facility] order, Performed in [facility] hospital lab) Nasopharyngeal Nasopharyngeal Swab     Status: None   Collection Time: 06/08/19  2:17 PM   Specimen: Nasopharyngeal Swab  Result Value Ref Range   SARS Coronavirus 2 NEGATIVE NEGATIVE    Comment: (NOTE) If result is NEGATIVE SARS-CoV-2 target nucleic acids are NOT DETECTED. The SARS-CoV-2 RNA is generally detectable in upper and lower  respiratory specimens during the acute phase of infection. The lowest  concentration of SARS-CoV-2 viral copies this assay can detect is 250  copies / mL. A negative result does not preclude SARS-CoV-2 infection  and should not be used as the sole basis for treatment or other  patient management decisions.  A negative result may occur with  improper specimen collection / handling, submission of specimen other  than nasopharyngeal swab, presence of viral mutation(s) within the   areas targeted by this assay, and inadequate number of viral copies  (<250 copies / mL). A negative result must be combined with clinical  observations, patient history, and epidemiological information. If result is POSITIVE SARS-CoV-2 target nucleic acids are DETECTED. The SARS-CoV-2 RNA is generally detectable in upper and lower  respiratory specimens dur ing the acute phase of infection.  Positive  results are indicative of active infection with SARS-CoV-2.  Clinical  correlation with patient history and other diagnostic information is  necessary to determine patient infection status.  Positive results do  not rule out bacterial infection or co-infection with other viruses. If result is PRESUMPTIVE POSTIVE SARS-CoV-2 nucleic acids MAY BE PRESENT.   A presumptive positive result was obtained on the submitted specimen  and confirmed on repeat testing.  While 2019 novel coronavirus  (SARS-CoV-2) nucleic acids may be present in the submitted sample  additional confirmatory testing may be necessary for epidemiological  and / or clinical management purposes  to differentiate between  SARS-CoV-2 and other Sarbecovirus currently known to infect humans.  If clinically indicated additional testing with an alternate test  methodology [phone]) is advised. The SARS-CoV-2 RNA is generally  detectable in upper and lower respiratory sp ecimens during the acute  phase of infection. The expected result is Negative. Fact Sheet for Patients:  StrictlyIdeas.no Fact Sheet for Healthcare Providers: BankingDealers.co.za This test is not yet approved or cleared by the [place] FDA and has been authorized for detection and/or  diagnosis of SARS-CoV-2 by FDA under an Emergency Use Authorization (EUA).  This EUA will remain in effect (meaning this test can be used) for the duration of the COVID-19 declaration under Section 564(b)(1) of the Act, 21  U.S.C. section 360bbb-3(b)(1), unless the authorization is terminated or revoked sooner. Performed at [facility], [address] [place], [place] [address]   Basic metabolic panel     Status: Abnormal   Collection Time: 06/09/19  6:10 AM  Result Value Ref Range   Sodium 140 135 - 145 mmol/L   Potassium 4.4 3.5 - 5.1 mmol/L    Comment: DELTA CHECK NOTED   Chloride 108 98 - 111 mmol/L   CO2 25 22 - 32 mmol/L   Glucose, Bld 97 70 - 99 mg/dL   BUN 65 (H) 8 - 23 mg/dL   Creatinine, Ser 3.38 (H) 0.61 - 1.24 mg/dL   Calcium 7.9 (L) 8.9 - 10.3 mg/dL   GFR calc non Af Amer 17 (L) >60 mL/min   GFR calc Af Amer 20 (L) >60 mL/min   Anion gap 7 5 - 15    Comment: Performed at [facility], [address], [place], [place] [address]  CBC     Status: Abnormal   Collection Time: 06/09/19  6:10 AM  Result Value Ref Range   WBC 6.8 4.0 - 10.5 K/uL   RBC 2.82 (L) 4.22 - 5.81 MIL/uL   Hemoglobin 8.6 (L) 13.0 - 17.0 g/dL   HCT 27.0 (L) 39.0 - 52.0 %   MCV 95.7 80.0 - 100.0 fL   MCH 30.5 26.0 - 34.0 pg   MCHC 31.9 30.0 - 36.0 g/dL   RDW 13.6 11.5 - 15.5 %   Platelets 131 (L) 150 - 400 K/uL   nRBC 0.0 0.0 - 0.2 %    Comment: Performed at [facility], [address] [place], [place] [address]  Iron and TIBC     Status: Abnormal   Collection Time: 06/09/19  6:10 AM  Result Value Ref Range   Iron 53 45 - 182 ug/dL   TIBC 194 (L) 250 - 450 ug/dL   Saturation Ratios 27 17.9 - 39.5 %   UIBC 141 ug/dL    Comment: Performed at [facility], [address] [place], [place] [address]  Ferritin     Status: None   Collection Time: 06/09/19  6:10 AM  Result Value Ref Range   Ferritin 247 24 - 336 ng/mL    Comment: Performed at [facility], [address], [place], [place] [address]    ABGS No results for input(s): PHART, PO2ART, TCO2, HCO3 in the last 72 hours.  Invalid input(s): PCO2 CULTURES Recent Results (from the past 240 hour(s))  SARS Coronavirus 2 [facility] order, Performed in [facility] hospital lab) Nasopharyngeal Nasopharyngeal Swab     Status: None   Collection Time: 06/08/19  2:17 PM   Specimen: Nasopharyngeal Swab  Result Value Ref Range Status   SARS Coronavirus 2 NEGATIVE NEGATIVE Final    Comment: (NOTE) If result is NEGATIVE SARS-CoV-2 target nucleic acids are NOT DETECTED. The SARS-CoV-2 RNA is generally detectable in upper and lower  respiratory specimens during the acute phase of infection. The lowest  concentration of SARS-CoV-2 viral copies this assay can detect is 250  copies / mL. A negative result does not preclude SARS-CoV-2 infection  and should not be used as the sole basis for treatment or other  patient management decisions.  A negative result may  occur with  improper specimen collection / handling, submission of specimen other  than nasopharyngeal swab, presence of viral mutation(s) within the  areas targeted by this assay, and inadequate number of viral copies  (<250 copies / mL). A negative result must be combined with clinical  observations, patient history, and epidemiological information. If result is POSITIVE SARS-CoV-2 target nucleic acids are DETECTED. The SARS-CoV-2 RNA is generally detectable in upper and lower  respiratory specimens dur ing the acute phase of infection.  Positive  results are indicative of active infection with SARS-CoV-2.  Clinical  correlation with patient history and other diagnostic information is  necessary to determine patient infection status.  Positive results do  not rule out bacterial infection or co-infection with other viruses. If result is PRESUMPTIVE POSTIVE SARS-CoV-2 nucleic acids MAY BE PRESENT.   A presumptive positive result was obtained on the submitted specimen  and confirmed on repeat testing.  While 2019 novel coronavirus  (SARS-CoV-2) nucleic acids may be present in the submitted sample  additional confirmatory testing may be necessary for epidemiological  and / or clinical management  purposes  to differentiate between  SARS-CoV-2 and other Sarbecovirus currently known to infect humans.  If clinically indicated additional testing with an alternate test  methodology [phone]) is advised. The SARS-CoV-2 RNA is generally  detectable in upper and lower respiratory sp ecimens during the acute  phase of infection. The expected result is Negative. Fact Sheet for Patients:  StrictlyIdeas.no Fact Sheet for Healthcare Providers: BankingDealers.co.za This test is not yet approved or cleared by the [place] FDA and has been authorized for detection and/or diagnosis of SARS-CoV-2 by FDA under an Emergency Use Authorization (EUA).  This EUA will remain in effect (meaning this test can be used) for the duration of the COVID-19 declaration under Section 564(b)(1) of the Act, 21 U.S.C. section 360bbb-3(b)(1), unless the authorization is terminated or revoked sooner. Performed at [facility], [address], [place], [place] [address]    Studies/Results: Dg Ankle 2 Views Right  Result Date: 06/08/2019 CLINICAL DATA:  Right ankle pain for a few years.  No known injury. EXAM: RIGHT ANKLE - 2 VIEW COMPARISON:  None. FINDINGS: No acute bony or joint abnormality is identified. Bony prominence along the medial aspect of the midfoot is noted and may be due to patient positioning. Soft tissues appear normal. IMPRESSION: No acute abnormality. Bony prominence along the medial aspect of the midfoot may be due to patient positioning. Dedicated plain films of the foot could be used for further characterization. Electronically Signed   By: Inge Rise M.D.   On: 06/08/2019 21:19   Dg Chest Portable 1 View  Result Date: 06/08/2019 CLINICAL DATA:  Failure to thrive EXAM: PORTABLE CHEST 1 VIEW COMPARISON:  Chest CT dated 11/05/2018. FINDINGS: The heart size and mediastinal contours are within normal limits. Calcifications are seen in the aortic  arch. Both lungs are clear. There is cortical thickening and increased density of the posteromedial right ninth rib and the posteromedial left tenth rib. IMPRESSION: No active disease. Electronically Signed   By: Zerita Boers M.D.   On: 06/08/2019 13:32    Medications:  Prior to Admission:  Medications Prior to Admission  Medication Sig Dispense Refill Last Dose  . atorvastatin (LIPITOR) 40 MG tablet Take 40 mg by mouth daily at 6 PM.    06/02/2019  . carvedilol (COREG) 12.5 MG tablet TAKE 1 TABLET BY MOUTH TWICE DAILY. (Patient taking differently: Take 12.5 mg by mouth  2 (two) times daily with a meal. ) 60 tablet 0 5/61/5379 at 4327MD  . folic acid (FOLVITE) 1 MG tablet Take 1 mg by mouth daily.   06/08/2019 at Unknown time  . furosemide (LASIX) 40 MG tablet Take 40 mg by mouth daily.    06/08/2019 at Unknown time  . lisinopril (ZESTRIL) 10 MG tablet Take 10 mg by mouth daily.    06/08/2019 at Unknown time  . magnesium oxide (MAG-OX) 400 (241.3 Mg) MG tablet Take 1 tablet (400 mg total) by mouth daily. 30 tablet 0 06/08/2019 at Unknown time  . methylPREDNISolone (MEDROL DOSEPAK) 4 MG TBPK tablet Take 4 mg by mouth as directed.    06/07/2019 at Unknown time  . Multiple Vitamins-Minerals (MULTIVITAMIN WITH MINERALS) tablet Take 1 tablet by mouth daily.   06/08/2019 at Unknown time  . nitroGLYCERIN (NITROSTAT) 0.4 MG SL tablet Place 0.4 mg under the tongue as needed.     unknown at unknown  . pantoprazole (PROTONIX) 40 MG tablet TAKE 1 TABLET BY MOUTH ONCE DAILY BEFORE BREAKFAST. (Patient taking differently: Take 40 mg by mouth daily. ) 30 tablet 0 06/08/2019 at Unknown time  . thiamine 100 MG tablet Take 1 tablet (100 mg total) by mouth daily. (Patient taking differently: Take 50 mg by mouth daily. ) 30 tablet 2 06/08/2019 at Unknown time  . colchicine 0.6 MG tablet Take 1 tablet (0.6 mg total) by mouth 2 (two) times daily. 14 tablet 0    Scheduled: . atorvastatin  40 mg Oral q1800  . folic acid  1 mg  Oral Daily  . heparin  5,000 Units Subcutaneous Q8H  . magnesium oxide  400 mg Oral Daily  . pantoprazole  40 mg Oral Daily  . thiamine  100 mg Oral Daily   Continuous: . sodium chloride 75 mL/hr at 06/09/19 0054  . cefTRIAXone (ROCEPHIN)  IV     [id number] **OR** acetaminophen, ondansetron **OR** ondansetron (ZOFRAN) IV, polyethylene glycol  Assesment: He was admitted with acute kidney injury.  I think he is dehydrated.  He is going to continue IV fluids.  Renal function has improved substantially already  He had hyperkalemia and that has resolved  He has poor appetite and failure to thrive at baseline which is an ongoing problem  He has cognitive decline and has help from his sister  He has what appears to be a urinary tract infection and that is being treated  He complains of ankle pain and ankle x-ray was okay except there was some question of bony prominence of the foot so I am going to x-ray that to be sure  He has COPD at baseline which is stable  He has coronary disease and his EKG showed some T wave changes but is not having any chest pain and that was more likely related to his acute illness Active Problems:   AKI (acute kidney injury) ([place])    Plan: Continue IV fluids.  X-ray his foot.  PT consult is underway.  Repeat blood work in the morning    LOS: 0 days   Alonza Bogus 06/09/2019, 8:13 AM

## 2019-06-09 NOTE — Evaluation (Signed)
Physical Therapy Evaluation Patient Details Name: [name] MRN: [id number] DOB: [birth year] Today's Date: 06/09/2019   History of Present Illness  [name] is a 73 y.o. male with medical history significant for coronary artery disease with systolic CHF, cirrhosis, gastric ulcer, CKD, presented to the ED with complaints of poor p.o. intake generalized weakness and right ankle pain.  Patient tells me his right ankle has been bothering him intermittently over the past 1 to 2 years at least, and is unchanged.  He denies swelling of his ankle, redness or warmth.  No fevers.  Reports poor p.o. intake from poor appetite over the past 2 weeks. He also reports weight loss.  Per intake notes patient also complained of constipation, but he tells me his last bowel movement was 1 to 2 days ago.  He otherwise denies any other complaints.  No pain with urination of frequency, no cough or difficulty breathing, no vomiting no loose stools.  No black stools, no blood in stools.  He denies taking fluid pills and does not know if he is on lisinopril.  His sister helps him with his medications. Patient was referred to the ED by primary care provider.    Clinical Impression  Patient demonstrates good return for bed mobility, has difficulty with transfers and gait due to inability to tolerate weightbearing on RLE due to increased ankle pain, had to use RW for safety and limited to a few steps at bedside with poor carryover for attempting toe touch weight bearing on right foot.  Patient tolerated sitting up in chair after therapy.  Patient instructed in and issued red thera-band for right ankle exercises and using his cane for stretching right heelcords with understanding acknowledged.  Patient will benefit from continued physical therapy in hospital and recommended venue below to increase strength, balance, endurance for safe ADLs and gait.    Follow Up Recommendations Home health PT;Supervision for  mobility/OOB;Supervision - Intermittent    Equipment Recommendations  Rolling walker with 5" wheels    Recommendations for Other Services       Precautions / Restrictions Precautions Precautions: Fall Restrictions Weight Bearing Restrictions: No      Mobility  Bed Mobility Overal bed mobility: Modified Independent             General bed mobility comments: slightly increased time  Transfers Overall transfer level: Needs assistance Equipment used: None;Rolling walker (2 wheeled) Transfers: Sit to/from [facility] Sit to Stand: Min guard Stand pivot transfers: Min guard       General transfer comment: very unsteady when attempting transfers without AD or using SPC, safer using RW  Ambulation/Gait Ambulation/Gait assistance: Min guard;Min assist Gait Distance (Feet): 6 Feet Assistive device: Rolling walker (2 wheeled) Gait Pattern/deviations: Decreased step length - left;Decreased stance time - right;Decreased stride length Gait velocity: decreased   General Gait Details: limited to 7-8 steps at bedside with NWB on RLE due to inability to tolerate weightbearing on RLE due to increased ankle pain  Stairs            Wheelchair Mobility    Modified Rankin (Stroke Patients Only)       Balance Overall balance assessment: Needs assistance Sitting-balance support: Feet supported;No upper extremity supported Sitting balance-Leahy Scale: Good Sitting balance - Comments: seated at bedside   Standing balance support: During functional activity;Single extremity supported Standing balance-Leahy Scale: Poor Standing balance comment: fair using RW  Pertinent Vitals/Pain Pain Assessment: Faces Faces Pain Scale: Hurts whole lot Pain Location: right ankle when attempting weightbearing or end range dorsiflexion Pain Descriptors / Indicators: Sharp;Sore Pain Intervention(s): Limited activity within patient's  tolerance;Monitored during session    [place] expects to be discharged to:: Private residence Living Arrangements: Children;Parent Available Help at Discharge: Family;Available PRN/intermittently Type of Home: House Home Access: Stairs to enter Entrance Stairs-Rails: Right;Left;Can reach both Entrance Stairs-Number of Steps: 2-3 Home Layout: One level Home Equipment: Cane - single point      Prior Function Level of Independence: Independent with assistive device(s)         Comments: Hydrographic surveyor using SPC PRN, drives     Hand Dominance   Dominant Hand: Right    Extremity/Trunk Assessment   Upper Extremity Assessment Upper Extremity Assessment: Overall WFL for tasks assessed    Lower Extremity Assessment Lower Extremity Assessment: Generalized weakness;RLE deficits/detail RLE Deficits / Details: grossly -4/5 except ankle dorsi/plantor flexion -3/5 RLE: Unable to fully assess due to pain RLE Sensation: WNL RLE Coordination: WNL    Cervical / Trunk Assessment Cervical / Trunk Assessment: Normal  Communication   Communication: No difficulties  Cognition Arousal/Alertness: Awake/alert Behavior During Therapy: WFL for tasks assessed/performed Overall Cognitive Status: Within Functional Limits for tasks assessed                                        General Comments      Exercises Other Exercises Other Exercises: Patient instructed in use of red theraband for resistive exercises to right ankle and how to use his SPC for stretching right heelcords to avoid contractures with fair/good carryover   Assessment/Plan    PT Assessment Patient needs continued PT services  PT Problem List Decreased strength;Decreased activity tolerance;Decreased balance;Decreased mobility;Pain;Decreased range of motion       PT Treatment Interventions Balance training;Gait training;Stair training;Functional mobility training;Therapeutic  exercise;Therapeutic activities;Patient/family education    PT Goals (Current goals can be found in the Care Plan section)  Acute Rehab PT Goals Patient Stated Goal: return home with family to assist PT Goal Formulation: With patient Time For Goal Achievement: 06/11/19 Potential to Achieve Goals: Good    Frequency Min 3X/week   Barriers to discharge        Co-evaluation               AM-PAC PT "6 Clicks" Mobility  Outcome Measure Help needed turning from your back to your side while in a flat bed without using bedrails?: None Help needed moving from lying on your back to sitting on the side of a flat bed without using bedrails?: None Help needed moving to and from a bed to a chair (including a wheelchair)?: A Little Help needed standing up from a chair using your arms (e.g., wheelchair or bedside chair)?: A Little Help needed to walk in hospital room?: A Little Help needed climbing 3-5 steps with a railing? : A Lot 6 Click Score: 19    End of Session   Activity Tolerance: Patient tolerated treatment well;Patient limited by fatigue;Patient limited by pain Patient left: in chair;with call bell/phone within reach Nurse Communication: Mobility status PT Visit Diagnosis: Unsteadiness on feet (R26.81);Other abnormalities of gait and mobility (R26.89);Muscle weakness (generalized) (M62.81)    Time: 8144-8185 PT Time Calculation (min) (ACUTE ONLY): 28 min   Charges:   PT Evaluation $PT Eval Moderate Complexity:  1 Mod PT Treatments $Therapeutic Activity: 23-37 mins        2:02 PM, 06/09/19 [name], MPT Physical Therapist with [facility] [phone] office [phone] mobile phone

## 2019-06-09 NOTE — Plan of Care (Signed)

## 2019-06-09 NOTE — TOC Initial Note (Signed)
Transition of Care [facility]) - Initial/Assessment Note    Patient Details  Name: [name] MRN: [id number] Date of Birth: 01-30-1946  Transition of Care [facility] CM/SW Contact:    Alexander Mage, LCSW Phone Number: 06/09/2019, 10:59 AM  Clinical Narrative:   [name] is a 73 YO AA male who lives with his sister, and has been for the past 10 years. He gave me permission to speak with [name], his sister.  She also cares for or takes responsibility for 3 other siblings, all of whom have a "touch of dementia."    She states that she just found out late yesterday that a friend has been supplying her brother with "white lightnin,'" and she figures that is why he has not had a appetite recently. She also went back and looked at some footage from an outdoor camera she has set up "because I want to know where he's at when he's not in the house because of his confusion," and has evidence of the exchange of the bottle.  She has seen this loss of appetite happen before when he was drinking liquor. [name] has a bedroom upstairs and uses a can to go up and down the stairs, but she plans on making up a bed for him in the living room so he can stay on one floor when he comes home. Besides his cane, she states there is also a rolling walker in the home that he can use, and there is a shower chair and grab rails in the bathroom.  [name] asked for a PT referral to [facility] as that is who worked with her sister when she came out of the hospital in the past.  Will need order for face to face for [facility] PT at d/c.  TOC will continue to follow for needs during course of hospitalization.              Expected Discharge Plan: [place] Barriers to Discharge: No Barriers Identified   Patient Goals and CMS Choice Patient states their goals for this hospitalization and ongoing recovery are:: "My foot is hurting." CMS Medicare.gov Compare Post Acute Care list provided to:: Patient  Represenative (must comment)(sister) Choice offered to / list presented to : Sibling  Expected Discharge Plan and Services Expected Discharge Plan: [place]   Discharge Planning Services: CM Consult Post Acute Care Choice: [place] arrangements for the past 2 months: [place]: PT [place]: [place] ([place]) Date [place]: 06/09/19 Time [place]: [place] Representative spoke with at [place]: Vaughan Basta  Prior Living Arrangements/Services Living arrangements for the past 2 months: [place] Lives with:: Siblings Patient language and need for interpreter reviewed:: Yes Do you feel safe going back to the place where you live?: Yes      Need for Family Participation in Patient Care: Yes (Comment) Care giver support system in place?: Yes (comment)   Criminal Activity/Legal Involvement Pertinent to Current Situation/Hospitalization: No - Comment as needed  Activities of Daily Living Home Assistive Devices/Equipment: None ADL Screening (condition at time of admission) Patient's cognitive ability adequate to safely complete daily activities?: Yes Is the patient deaf or have difficulty hearing?: No Does the patient have difficulty seeing, even when wearing glasses/contacts?:  No Does the patient have difficulty concentrating, remembering, or making decisions?: No Patient able to express need for assistance with ADLs?: Yes Does the patient have difficulty dressing or bathing?: No Independently performs ADLs?: Yes (appropriate for developmental age) Does the patient have difficulty walking or climbing stairs?: No Weakness of Legs: None Weakness of Arms/Hands: None  Permission Sought/Granted Permission sought to share information with : Family Supports Permission granted to share information with : Yes, Verbal Permission Granted  Share Information with NAME: [name]     Permission granted to share info w Relationship: sister  Permission granted to share info w Contact Information: [phone]  Emotional Assessment Appearance:: Appears stated age Attitude/Demeanor/Rapport: Engaged Affect (typically observed): Appropriate Orientation: : Oriented to Self, Oriented to Place Alcohol / Substance Use: Alcohol Use Psych Involvement: No (comment)  Admission diagnosis:  Hyperkalemia [E87.5] Urinary tract infection without hematuria, site unspecified [N39.0] Acute renal failure superimposed on stage 3 chronic kidney disease, unspecified acute renal failure type ([place]) [N17.9, N18.3] Patient Active Problem List   Diagnosis Date Noted  . Acute kidney injury ([place]) 06/09/2019  . Hyperkalemia 06/09/2019  . Gastric ulcer without hemorrhage or perforation 11/21/2018  . Hematochezia   . Chronic systolic congestive heart failure ([place])   . Hemorrhagic shock and encephalopathy syndrome ([place])   . Acute upper GI bleed   . Gastric ulcer 12/20/2016  . Acute blood loss anemia 12/20/2016  . Hypotension 12/18/2016  . Volume depletion 12/18/2016  . Ischemic cardiomyopathy 12/18/2016  . Anemia 12/18/2016  . Heme + stool 12/18/2016  . Dehydration   . Thrombocytopenia ([place])   . Malnutrition of moderate degree 12/15/2016  . AKI (acute kidney injury) ([place]) 12/13/2016  . Unintentional weight loss 12/13/2016  . Alcohol dependence ([place]) 12/13/2016  . Hyperglycemia 12/13/2016  . Elevated troponin 12/13/2016  . Anemia, normocytic normochromic 03/28/2012  . Coronary artery disease   . Gastroesophageal reflux disease   . Chronic kidney disease   . Cirrhosis ([place])   . Tobacco abuse    PCP:  Sinda Du, MD Pharmacy:   [facility] [place] [address] [place] [address] Phone: [phone] Fax: [phone]     Social Determinants of Health (SDOH) Interventions    Readmission Risk Interventions No flowsheet data  found.

## 2019-06-10 LAB — BASIC METABOLIC PANEL
Anion gap: 6 (ref 5–15)
BUN: 37 mg/dL — ABNORMAL HIGH (ref 8–23)
CO2: 23 mmol/L (ref 22–32)
Calcium: 7.9 mg/dL — ABNORMAL LOW (ref 8.9–10.3)
Chloride: 110 mmol/L (ref 98–111)
Creatinine, Ser: 1.93 mg/dL — ABNORMAL HIGH (ref 0.61–1.24)
GFR calc Af Amer: 39 mL/min — ABNORMAL LOW (ref >60)
GFR calc non Af Amer: 33 mL/min — ABNORMAL LOW (ref >60)
Glucose, Bld: 92 mg/dL (ref 70–99)
Potassium: 4.3 mmol/L (ref 3.5–5.1)
Sodium: 139 mmol/L (ref 135–145)

## 2019-06-10 NOTE — Plan of Care (Signed)

## 2019-06-10 NOTE — Progress Notes (Signed)
Subjective: He says he feels okay.  No complaints.  No complaints of pain in his foot.  Urine culture was negative so antibiotic will be discontinued.  His renal function has improved.  He is eating okay.  Objective: Vital signs in last 24 hours: Temp:  [98 F (36.7 C)-98.2 F (36.8 C)] 98 F (36.7 C) (09/23 0521) Pulse Rate:  [72-78] 72 (09/23 0521) Resp:  [16-18] 18 (09/23 0521) BP: (89-124)/(58-67) 124/67 (09/23 0521) SpO2:  [99 %-100 %] 100 % (09/23 0521) Weight change:     Intake/Output from previous day: 09/22 0701 - 09/23 0700 In: 2560 [P.O.:960; I.V.:1500; IV Piggyback:100] Out: 975 [Urine:975]  PHYSICAL EXAM General appearance: alert, cooperative and no distress Resp: clear to auscultation bilaterally Cardio: regular rate and rhythm, S1, S2 normal, no murmur, click, rub or gallop GI: soft, non-tender; bowel sounds normal; no masses,  no organomegaly Extremities: extremities normal, atraumatic, no cyanosis or edema  Lab Results:  Results for orders placed or performed during the hospital encounter of 06/08/19 (from the past 48 hour(s))  Urinalysis, Routine w reflex microscopic     Status: Abnormal   Collection Time: 06/08/19 11:32 AM  Result Value Ref Range   Color, Urine AMBER (A) YELLOW    Comment: BIOCHEMICALS MAY BE AFFECTED BY COLOR   APPearance CLOUDY (A) CLEAR   Specific Gravity, Urine 1.012 1.005 - 1.030   pH 5.0 5.0 - 8.0   Glucose, UA NEGATIVE NEGATIVE mg/dL   Hgb urine dipstick NEGATIVE NEGATIVE   Bilirubin Urine NEGATIVE NEGATIVE   Ketones, ur NEGATIVE NEGATIVE mg/dL   Protein, ur NEGATIVE NEGATIVE mg/dL   Nitrite NEGATIVE NEGATIVE   Leukocytes,Ua LARGE (A) NEGATIVE   RBC / HPF 11-20 0 - 5 RBC/hpf   WBC, UA >50 (H) 0 - 5 WBC/hpf   Bacteria, UA RARE (A) NONE SEEN   Squamous Epithelial / LPF 6-10 0 - 5   WBC Clumps PRESENT    Mucus PRESENT    Hyaline Casts, UA PRESENT     Comment: Performed at [facility], [address] [place], [place]  [address]  Urine Culture     Status: None   Collection Time: 06/08/19 11:32 AM   Specimen: Urine, Clean Catch  Result Value Ref Range   Specimen Description      URINE, CLEAN CATCH Performed at [facility], [address], [place], [place] [address]    Special Requests      NONE Performed at [facility], [address], [place], [place] [address]    Culture      NO GROWTH Performed at [facility], [place] [address], [place],  [address]    Report Status 06/09/2019 FINAL   Comprehensive metabolic panel     Status: Abnormal   Collection Time: 06/08/19 11:58 AM  Result Value Ref Range   Sodium 137 135 - 145 mmol/L   Potassium 5.9 (H) 3.5 - 5.1 mmol/L   Chloride 99 98 - 111 mmol/L   CO2 25 22 - 32 mmol/L   Glucose, Bld 153 (H) 70 - 99 mg/dL   BUN 84 (H) 8 - 23 mg/dL   Creatinine, Ser 5.94 (H) 0.61 - 1.24 mg/dL   Calcium 9.0 8.9 - 10.3 mg/dL   Total Protein 8.4 (H) 6.5 - 8.1 g/dL   Albumin 3.4 (L) 3.5 - 5.0 g/dL   AST 25 15 - 41 U/L   ALT 20 0 - 44 U/L   Alkaline Phosphatase 59 38 - 126  U/L   Total Bilirubin 0.3 0.3 - 1.2 mg/dL   GFR calc non Af Amer 9 (L) >60 mL/min   GFR calc Af Amer 10 (L) >60 mL/min   Anion gap 13 5 - 15    Comment: Performed at [facility], [address], [place], [place] [address]  Lipase, blood     Status: None   Collection Time: 06/08/19 11:58 AM  Result Value Ref Range   Lipase 47 11 - 51 U/L    Comment: Performed at [facility], [address], [place], [place] [address]  Troponin I (High Sensitivity)     Status: Abnormal   Collection Time: 06/08/19 11:58 AM  Result Value Ref Range   Troponin I (High Sensitivity) 35 (H) <18 ng/L    Comment: (NOTE) Elevated high sensitivity troponin I (hsTnI) values and significant  changes across serial measurements may suggest ACS but many other  chronic and acute conditions are known to elevate hsTnI results.  Refer to the "Links" section for chest pain algorithms and additional  guidance. Performed  at [facility], [address], [place], [place] [address]   Lactic acid, plasma     Status: Abnormal   Collection Time: 06/08/19 11:58 AM  Result Value Ref Range   Lactic Acid, Venous 2.1 (HH) 0.5 - 1.9 mmol/L    Comment: CRITICAL RESULT CALLED TO, READ BACK BY AND VERIFIED WITH: WHITE,M AT 1302 ON 9.21.20 BY ISLEY,B Performed at [facility], [address], [place], [place] [address]   CBC with Differential     Status: Abnormal   Collection Time: 06/08/19 11:58 AM  Result Value Ref Range   WBC 9.6 4.0 - 10.5 K/uL   RBC 2.94 (L) 4.22 - 5.81 MIL/uL   Hemoglobin 9.0 (L) 13.0 - 17.0 g/dL   HCT 28.1 (L) 39.0 - 52.0 %   MCV 95.6 80.0 - 100.0 fL   MCH 30.6 26.0 - 34.0 pg   MCHC 32.0 30.0 - 36.0 g/dL   RDW 13.6 11.5 - 15.5 %   Platelets 160 150 - 400 K/uL   nRBC 0.0 0.0 - 0.2 %   Neutrophils Relative % 78 %   Neutro Abs 7.4 1.7 - 7.7 K/uL   Lymphocytes Relative 18 %   Lymphs Abs 1.8 0.7 - 4.0 K/uL   Monocytes Relative 4 %   Monocytes Absolute 0.4 0.1 - 1.0 K/uL   Eosinophils Relative 0 %   Eosinophils Absolute 0.0 0.0 - 0.5 K/uL   Basophils Relative 0 %   Basophils Absolute 0.0 0.0 - 0.1 K/uL   Immature Granulocytes 0 %   Abs Immature Granulocytes 0.02 0.00 - 0.07 K/uL    Comment: Performed at [facility], [address] [place], [place] [address]  Sample to Blood Bank     Status: None   Collection Time: 06/08/19 11:58 AM  Result Value Ref Range   Blood Bank Specimen SAMPLE AVAILABLE FOR TESTING    Sample Expiration      06/11/2019,2359 Performed at [facility], [address] [place], [place] [address]   Uric acid     Status: Abnormal   Collection Time: 06/08/19 11:58 AM  Result Value Ref Range   Uric Acid, Serum 15.0 (H) 3.7 - 8.6 mg/dL    Comment: Performed at [facility], [address], [place],  [address]  Ethanol     Status: None   Collection Time: 06/08/19 11:58 AM  Result Value Ref Range   Alcohol, Ethyl (B) <10 <  10 mg/dL    Comment: (NOTE) Lowest  detectable limit for serum alcohol is 10 mg/dL. For medical purposes only. Performed at [facility], [address], [place], [place] [address]   Lactic acid, plasma     Status: None   Collection Time: 06/08/19  1:40 PM  Result Value Ref Range   Lactic Acid, Venous 1.1 0.5 - 1.9 mmol/L    Comment: Performed at [facility], [address], [place], [place] [address]  Troponin I (High Sensitivity)     Status: Abnormal   Collection Time: 06/08/19  1:40 PM  Result Value Ref Range   Troponin I (High Sensitivity) 31 (H) <18 ng/L    Comment: (NOTE) Elevated high sensitivity troponin I (hsTnI) values and significant  changes across serial measurements may suggest ACS but many other  chronic and acute conditions are known to elevate hsTnI results.  Refer to the "Links" section for chest pain algorithms and additional  guidance. Performed at [facility], [address] [place], [place] [address]   Magnesium     Status: Abnormal   Collection Time: 06/08/19  1:40 PM  Result Value Ref Range   Magnesium 3.0 (H) 1.7 - 2.4 mg/dL    Comment: Performed at [facility], [address] [place], [place] [address]  SARS Coronavirus 2 [facility] order, Performed in [facility] hospital lab) Nasopharyngeal Nasopharyngeal Swab     Status: None   Collection Time: 06/08/19  2:17 PM   Specimen: Nasopharyngeal Swab  Result Value Ref Range   SARS Coronavirus 2 NEGATIVE NEGATIVE    Comment: (NOTE) If result is NEGATIVE SARS-CoV-2 target nucleic acids are NOT DETECTED. The SARS-CoV-2 RNA is generally detectable in upper and lower  respiratory specimens during the acute phase of infection. The lowest  concentration of SARS-CoV-2 viral copies this assay can detect is 250  copies / mL. A negative result does not preclude SARS-CoV-2 infection  and should not be used as the sole basis for treatment or other  patient management decisions.  A negative result may occur with  improper specimen collection / handling,  submission of specimen other  than nasopharyngeal swab, presence of viral mutation(s) within the  areas targeted by this assay, and inadequate number of viral copies  (<250 copies / mL). A negative result must be combined with clinical  observations, patient history, and epidemiological information. If result is POSITIVE SARS-CoV-2 target nucleic acids are DETECTED. The SARS-CoV-2 RNA is generally detectable in upper and lower  respiratory specimens dur ing the acute phase of infection.  Positive  results are indicative of active infection with SARS-CoV-2.  Clinical  correlation with patient history and other diagnostic information is  necessary to determine patient infection status.  Positive results do  not rule out bacterial infection or co-infection with other viruses. If result is PRESUMPTIVE POSTIVE SARS-CoV-2 nucleic acids MAY BE PRESENT.   A presumptive positive result was obtained on the submitted specimen  and confirmed on repeat testing.  While 2019 novel coronavirus  (SARS-CoV-2) nucleic acids may be present in the submitted sample  additional confirmatory testing may be necessary for epidemiological  and / or clinical management purposes  to differentiate between  SARS-CoV-2 and other Sarbecovirus currently known to infect humans.  If clinically indicated additional testing with an alternate test  methodology [phone]) is advised. The SARS-CoV-2 RNA is generally  detectable in upper and lower respiratory sp ecimens during the acute  phase of infection. The expected result is Negative. Fact Sheet  for Patients:  StrictlyIdeas.no Fact Sheet for Healthcare Providers: BankingDealers.co.za This test is not yet approved or cleared by the [place] FDA and has been authorized for detection and/or diagnosis of SARS-CoV-2 by FDA under an Emergency Use Authorization (EUA).  This EUA will remain in effect (meaning this test can be  used) for the duration of the COVID-19 declaration under Section 564(b)(1) of the Act, 21 U.S.C. section 360bbb-3(b)(1), unless the authorization is terminated or revoked sooner. Performed at [facility], [address], [place], [place] [address]   Basic metabolic panel     Status: Abnormal   Collection Time: 06/09/19  6:10 AM  Result Value Ref Range   Sodium 140 135 - 145 mmol/L   Potassium 4.4 3.5 - 5.1 mmol/L    Comment: DELTA CHECK NOTED   Chloride 108 98 - 111 mmol/L   CO2 25 22 - 32 mmol/L   Glucose, Bld 97 70 - 99 mg/dL   BUN 65 (H) 8 - 23 mg/dL   Creatinine, Ser 3.38 (H) 0.61 - 1.24 mg/dL   Calcium 7.9 (L) 8.9 - 10.3 mg/dL   GFR calc non Af Amer 17 (L) >60 mL/min   GFR calc Af Amer 20 (L) >60 mL/min   Anion gap 7 5 - 15    Comment: Performed at [facility], [address], [place], [place] [address]  CBC     Status: Abnormal   Collection Time: 06/09/19  6:10 AM  Result Value Ref Range   WBC 6.8 4.0 - 10.5 K/uL   RBC 2.82 (L) 4.22 - 5.81 MIL/uL   Hemoglobin 8.6 (L) 13.0 - 17.0 g/dL   HCT 27.0 (L) 39.0 - 52.0 %   MCV 95.7 80.0 - 100.0 fL   MCH 30.5 26.0 - 34.0 pg   MCHC 31.9 30.0 - 36.0 g/dL   RDW 13.6 11.5 - 15.5 %   Platelets 131 (L) 150 - 400 K/uL   nRBC 0.0 0.0 - 0.2 %    Comment: Performed at [facility], [address], [place], [place] [address]  Iron and TIBC     Status: Abnormal   Collection Time: 06/09/19  6:10 AM  Result Value Ref Range   Iron 53 45 - 182 ug/dL   TIBC 194 (L) 250 - 450 ug/dL   Saturation Ratios 27 17.9 - 39.5 %   UIBC 141 ug/dL    Comment: Performed at [facility], [address] [place], [place] [address]  Ferritin     Status: None   Collection Time: 06/09/19  6:10 AM  Result Value Ref Range   Ferritin 247 24 - 336 ng/mL    Comment: Performed at [facility], [address] [place], [place] [address]  Basic metabolic panel     Status: Abnormal   Collection Time: 06/10/19  6:01 AM  Result Value Ref Range   Sodium 139 135 - 145  mmol/L   Potassium 4.3 3.5 - 5.1 mmol/L   Chloride 110 98 - 111 mmol/L   CO2 23 22 - 32 mmol/L   Glucose, Bld 92 70 - 99 mg/dL   BUN 37 (H) 8 - 23 mg/dL   Creatinine, Ser 1.93 (H) 0.61 - 1.24 mg/dL    Comment: DELTA CHECK NOTED   Calcium 7.9 (L) 8.9 - 10.3 mg/dL   GFR calc non Af Amer 33 (L) >60 mL/min   GFR calc Af Amer 39 (L) >60 mL/min   Anion gap 6 5 - 15  Comment: Performed at [facility], [address] [place], [place] [address]    ABGS No results for input(s): PHART, PO2ART, TCO2, HCO3 in the last 72 hours.  Invalid input(s): PCO2 CULTURES Recent Results (from the past 240 hour(s))  Urine Culture     Status: None   Collection Time: 06/08/19 11:32 AM   Specimen: Urine, Clean Catch  Result Value Ref Range Status   Specimen Description   Final    URINE, CLEAN CATCH Performed at [facility], [address], [place], [place] [address]    Special Requests   Final    NONE Performed at [facility], [address], [place], [place] [address]    Culture   Final    NO GROWTH Performed at [facility], [place] [address], [place], [place] [address]    Report Status 06/09/2019 FINAL  Final  SARS Coronavirus 2 [facility] order, Performed in [facility] hospital lab) Nasopharyngeal Nasopharyngeal Swab     Status: None   Collection Time: 06/08/19  2:17 PM   Specimen: Nasopharyngeal Swab  Result Value Ref Range Status   SARS Coronavirus 2 NEGATIVE NEGATIVE Final    Comment: (NOTE) If result is NEGATIVE SARS-CoV-2 target nucleic acids are NOT DETECTED. The SARS-CoV-2 RNA is generally detectable in upper and lower  respiratory specimens during the acute phase of infection. The lowest  concentration of SARS-CoV-2 viral copies this assay can detect is 250  copies / mL. A negative result does not preclude SARS-CoV-2 infection  and should not be used as the sole basis for treatment or other  patient management decisions.  A negative result may occur with  improper specimen  collection / handling, submission of specimen other  than nasopharyngeal swab, presence of viral mutation(s) within the  areas targeted by this assay, and inadequate number of viral copies  (<250 copies / mL). A negative result must be combined with clinical  observations, patient history, and epidemiological information. If result is POSITIVE SARS-CoV-2 target nucleic acids are DETECTED. The SARS-CoV-2 RNA is generally detectable in upper and lower  respiratory specimens dur ing the acute phase of infection.  Positive  results are indicative of active infection with SARS-CoV-2.  Clinical  correlation with patient history and other diagnostic information is  necessary to determine patient infection status.  Positive results do  not rule out bacterial infection or co-infection with other viruses. If result is PRESUMPTIVE POSTIVE SARS-CoV-2 nucleic acids MAY BE PRESENT.   A presumptive positive result was obtained on the submitted specimen  and confirmed on repeat testing.  While 2019 novel coronavirus  (SARS-CoV-2) nucleic acids may be present in the submitted sample  additional confirmatory testing may be necessary for epidemiological  and / or clinical management purposes  to differentiate between  SARS-CoV-2 and other Sarbecovirus currently known to infect humans.  If clinically indicated additional testing with an alternate test  methodology [phone]) is advised. The SARS-CoV-2 RNA is generally  detectable in upper and lower respiratory sp ecimens during the acute  phase of infection. The expected result is Negative. Fact Sheet for Patients:  StrictlyIdeas.no Fact Sheet for Healthcare Providers: BankingDealers.co.za This test is not yet approved or cleared by the [place] FDA and has been authorized for detection and/or diagnosis of SARS-CoV-2 by FDA under an Emergency Use Authorization (EUA).  This EUA will remain in effect  (meaning this test can be used) for the duration of the COVID-19 declaration under Section 564(b)(1) of the Act, 21 U.S.C.  section 360bbb-3(b)(1), unless the authorization is terminated or revoked sooner. Performed at [facility], [address] [place], [place] [address]    Studies/Results: Dg Ankle 2 Views Right  Result Date: 06/08/2019 CLINICAL DATA:  Right ankle pain for a few years.  No known injury. EXAM: RIGHT ANKLE - 2 VIEW COMPARISON:  None. FINDINGS: No acute bony or joint abnormality is identified. Bony prominence along the medial aspect of the midfoot is noted and may be due to patient positioning. Soft tissues appear normal. IMPRESSION: No acute abnormality. Bony prominence along the medial aspect of the midfoot may be due to patient positioning. Dedicated plain films of the foot could be used for further characterization. Electronically Signed   By: Inge Rise M.D.   On: 06/08/2019 21:19   Dg Chest Portable 1 View  Result Date: 06/08/2019 CLINICAL DATA:  Failure to thrive EXAM: PORTABLE CHEST 1 VIEW COMPARISON:  Chest CT dated 11/05/2018. FINDINGS: The heart size and mediastinal contours are within normal limits. Calcifications are seen in the aortic arch. Both lungs are clear. There is cortical thickening and increased density of the posteromedial right ninth rib and the posteromedial left tenth rib. IMPRESSION: No active disease. Electronically Signed   By: Zerita Boers M.D.   On: 06/08/2019 13:32   Dg Foot Complete Right  Result Date: 06/09/2019 CLINICAL DATA:  Medial right foot pain.  No known injury. EXAM: RIGHT FOOT COMPLETE - 3+ VIEW COMPARISON:  None. FINDINGS: There is no evidence of fracture or dislocation. There is no evidence of arthropathy or other focal bone abnormality. Soft tissues are unremarkable. IMPRESSION: Negative. Electronically Signed   By: San Morelle M.D.   On: 06/09/2019 09:56    Medications:  Prior to Admission:  Medications Prior to  Admission  Medication Sig Dispense Refill Last Dose  . atorvastatin (LIPITOR) 40 MG tablet Take 40 mg by mouth daily at 6 PM.    06/02/2019  . carvedilol (COREG) 12.5 MG tablet TAKE 1 TABLET BY MOUTH TWICE DAILY. (Patient taking differently: Take 12.5 mg by mouth 2 (two) times daily with a meal. ) 60 tablet 0 0/99/8338 at 2505LZ  . folic acid (FOLVITE) 1 MG tablet Take 1 mg by mouth daily.   06/08/2019 at Unknown time  . furosemide (LASIX) 40 MG tablet Take 40 mg by mouth daily.    06/08/2019 at Unknown time  . lisinopril (ZESTRIL) 10 MG tablet Take 10 mg by mouth daily.    06/08/2019 at Unknown time  . magnesium oxide (MAG-OX) 400 (241.3 Mg) MG tablet Take 1 tablet (400 mg total) by mouth daily. 30 tablet 0 06/08/2019 at Unknown time  . methylPREDNISolone (MEDROL DOSEPAK) 4 MG TBPK tablet Take 4 mg by mouth as directed.    06/07/2019 at Unknown time  . Multiple Vitamins-Minerals (MULTIVITAMIN WITH MINERALS) tablet Take 1 tablet by mouth daily.   06/08/2019 at Unknown time  . nitroGLYCERIN (NITROSTAT) 0.4 MG SL tablet Place 0.4 mg under the tongue as needed.     unknown at unknown  . pantoprazole (PROTONIX) 40 MG tablet TAKE 1 TABLET BY MOUTH ONCE DAILY BEFORE BREAKFAST. (Patient taking differently: Take 40 mg by mouth daily. ) 30 tablet 0 06/08/2019 at Unknown time  . thiamine 100 MG tablet Take 1 tablet (100 mg total) by mouth daily. (Patient taking differently: Take 50 mg by mouth daily. ) 30 tablet 2 06/08/2019 at Unknown time  . colchicine 0.6 MG tablet Take 1 tablet (0.6 mg total) by mouth 2 (  two) times daily. 14 tablet 0    Scheduled: . atorvastatin  40 mg Oral q1800  . folic acid  1 mg Oral Daily  . heparin  5,000 Units Subcutaneous Q8H  . magnesium oxide  400 mg Oral Daily  . pantoprazole  40 mg Oral Daily  . thiamine  100 mg Oral Daily   Continuous: . sodium chloride 75 mL/hr at 06/10/19 0515  . cefTRIAXone (ROCEPHIN)  IV 1 g (06/09/19 1422)   BEM:[id number] **OR** acetaminophen,  ondansetron **OR** ondansetron (ZOFRAN) IV, polyethylene glycol  Assesment: He was admitted with acute kidney injury and acute on chronic renal failure.  He is doing better with that.  It seems a lot of the problem was volume depletion/dehydration.  He has failure to thrive at home and has moderate malnutrition.  He has had work-up several times for his unintentional weight loss and thus far we have not found anything  He has coronary disease but no chest pain  He has chronic systolic heart failure but I think he still needs another day of IV fluids  He has cirrhosis of the liver stable  There was concern that he had UTI from admitting physician and he was started on Rocephin but his culture is negative so this will be discontinued Active Problems:   Coronary artery disease   Gastroesophageal reflux disease   Cirrhosis (HCC)   Tobacco abuse   AKI (acute kidney injury) ([place])   Unintentional weight loss   Malnutrition of moderate degree   Hypotension   Volume depletion   Dehydration   Chronic systolic congestive heart failure (HCC)   Acute kidney injury ([place])   Hyperkalemia    Plan: No change in treatments.  Continue IV fluids 1 more day.  Recheck renal function tomorrow.  Potential discharge tomorrow.    LOS: 1 day   Alonza Bogus 06/10/2019, 8:07 AM

## 2019-06-10 NOTE — Plan of Care (Signed)
  Problem: Education: Goal: Knowledge of General Education information will improve Description Including pain rating scale, medication(s)/side effects and non-pharmacologic comfort measures Outcome: Progressing   Problem: Health Behavior/Discharge Planning: Goal: Ability to manage health-related needs will improve Outcome: Progressing   

## 2019-06-11 DIAGNOSIS — R4189 Other symptoms and signs involving cognitive functions and awareness: Secondary | ICD-10-CM | POA: Diagnosis present

## 2019-06-11 LAB — BASIC METABOLIC PANEL
Anion gap: 6 (ref 5–15)
BUN: 25 mg/dL — ABNORMAL HIGH (ref 8–23)
CO2: 21 mmol/L — ABNORMAL LOW (ref 22–32)
Calcium: 7.8 mg/dL — ABNORMAL LOW (ref 8.9–10.3)
Chloride: 112 mmol/L — ABNORMAL HIGH (ref 98–111)
Creatinine, Ser: 1.7 mg/dL — ABNORMAL HIGH (ref 0.61–1.24)
GFR calc Af Amer: 45 mL/min — ABNORMAL LOW (ref >60)
GFR calc non Af Amer: 39 mL/min — ABNORMAL LOW (ref >60)
Glucose, Bld: 96 mg/dL (ref 70–99)
Potassium: 4.2 mmol/L (ref 3.5–5.1)
Sodium: 139 mmol/L (ref 135–145)

## 2019-06-11 NOTE — Plan of Care (Signed)

## 2019-06-11 NOTE — TOC Transition Note (Addendum)
Transition of Care [facility]) - CM/SW Discharge Note   Patient Details  Name: GAUTHAM HEWINS MRN: [id number] Date of Birth: October 21, 1945  [facility] CM/SW Contact:  Trish Mage, LCSW Phone Number: 06/11/2019, 11:37 AM   Clinical Narrative:  Pt to d/c today.  Will receive services from [place] for PT, who were alerted of d/c.  Orders for [facility] seen and appreciated.  Hospital follow up appointment in place.  Sister will transport home.  Sister states they have a rolling walker at home that he can use.  She will have [place] PT make sure they approve of it, and order another if need be. TOC sign off.    Final next level of care: [place] Barriers to Discharge: No Barriers Identified   Patient Goals and CMS Choice Patient states their goals for this hospitalization and ongoing recovery are:: "My leg is hurting." CMS Medicare.gov Compare Post Acute Care list provided to:: Patient Represenative (must comment)(sister) Choice offered to / list presented to : Sibling  Discharge Placement                       Discharge Plan and Services   Discharge Planning Services: CM Consult Post Acute Care Choice: [place]: PT [place]: [place] ([place]) Date [facility] Agency Contacted: 06/09/19 Time Ward: 1053 Representative spoke with at [place]: [place] Determinants of Health ([place]) Interventions     Readmission Risk Interventions No flowsheet data found.

## 2019-06-11 NOTE — Progress Notes (Signed)
Subjective: He says he feels well.  No complaints.  His breathing is okay.  Renal function is approaching baseline.  Objective: Vital signs in last 24 hours: Temp:  [98.1 F (36.7 C)-98.3 F (36.8 C)] 98.1 F (36.7 C) (09/24 0500) Pulse Rate:  [65-80] 70 (09/24 0500) Resp:  [16-18] 18 (09/24 0500) BP: (109-126)/(58-65) 118/62 (09/24 0500) SpO2:  [100 %] 100 % (09/24 0500) Weight change:     Intake/Output from previous day: 09/23 0701 - 09/24 0700 In: 960 [P.O.:960] Out: 1250 [Urine:1250]  PHYSICAL EXAM General appearance: alert, cooperative and no distress Resp: clear to auscultation bilaterally Cardio: regular rate and rhythm, S1, S2 normal, no murmur, click, rub or gallop GI: soft, non-tender; bowel sounds normal; no masses,  no organomegaly Extremities: extremities normal, atraumatic, no cyanosis or edema  Lab Results:  Results for orders placed or performed during the hospital encounter of 06/08/19 (from the past 48 hour(s))  Basic metabolic panel     Status: Abnormal   Collection Time: 06/10/19  6:01 AM  Result Value Ref Range   Sodium 139 135 - 145 mmol/L   Potassium 4.3 3.5 - 5.1 mmol/L   Chloride 110 98 - 111 mmol/L   CO2 23 22 - 32 mmol/L   Glucose, Bld 92 70 - 99 mg/dL   BUN 37 (H) 8 - 23 mg/dL   Creatinine, Ser 1.93 (H) 0.61 - 1.24 mg/dL    Comment: DELTA CHECK NOTED   Calcium 7.9 (L) 8.9 - 10.3 mg/dL   GFR calc non Af Amer 33 (L) >60 mL/min   GFR calc Af Amer 39 (L) >60 mL/min   Anion gap 6 5 - 15    Comment: Performed at [facility], [address] [place], [place] [address]  Basic metabolic panel     Status: Abnormal   Collection Time: 06/11/19  5:00 AM  Result Value Ref Range   Sodium 139 135 - 145 mmol/L   Potassium 4.2 3.5 - 5.1 mmol/L   Chloride 112 (H) 98 - 111 mmol/L   CO2 21 (L) 22 - 32 mmol/L   Glucose, Bld 96 70 - 99 mg/dL   BUN 25 (H) 8 - 23 mg/dL   Creatinine, Ser 1.70 (H) 0.61 - 1.24 mg/dL   Calcium 7.8 (L) 8.9 - 10.3 mg/dL   GFR  calc non Af Amer 39 (L) >60 mL/min   GFR calc Af Amer 45 (L) >60 mL/min   Anion gap 6 5 - 15    Comment: Performed at [facility], [address], [place], [place] [address]    ABGS No results for input(s): PHART, PO2ART, TCO2, HCO3 in the last 72 hours.  Invalid input(s): PCO2 CULTURES Recent Results (from the past 240 hour(s))  Urine Culture     Status: None   Collection Time: 06/08/19 11:32 AM   Specimen: Urine, Clean Catch  Result Value Ref Range Status   Specimen Description   Final    URINE, CLEAN CATCH Performed at [facility], [address], [place], [place] [address]    Special Requests   Final    NONE Performed at [facility], [address], [place], [place] [address]    Culture   Final    NO GROWTH Performed at [facility], [place] [address], [place], [place] [address]    Report Status 06/09/2019 FINAL  Final  SARS Coronavirus 2 [facility] order, Performed in [facility] hospital lab) Nasopharyngeal Nasopharyngeal Swab     Status: None  Collection Time: 06/08/19  2:17 PM   Specimen: Nasopharyngeal Swab  Result Value Ref Range Status   SARS Coronavirus 2 NEGATIVE NEGATIVE Final    Comment: (NOTE) If result is NEGATIVE SARS-CoV-2 target nucleic acids are NOT DETECTED. The SARS-CoV-2 RNA is generally detectable in upper and lower  respiratory specimens during the acute phase of infection. The lowest  concentration of SARS-CoV-2 viral copies this assay can detect is 250  copies / mL. A negative result does not preclude SARS-CoV-2 infection  and should not be used as the sole basis for treatment or other  patient management decisions.  A negative result may occur with  improper specimen collection / handling, submission of specimen other  than nasopharyngeal swab, presence of viral mutation(s) within the  areas targeted by this assay, and inadequate number of viral copies  (<250 copies / mL). A negative result must be combined with clinical  observations,  patient history, and epidemiological information. If result is POSITIVE SARS-CoV-2 target nucleic acids are DETECTED. The SARS-CoV-2 RNA is generally detectable in upper and lower  respiratory specimens dur ing the acute phase of infection.  Positive  results are indicative of active infection with SARS-CoV-2.  Clinical  correlation with patient history and other diagnostic information is  necessary to determine patient infection status.  Positive results do  not rule out bacterial infection or co-infection with other viruses. If result is PRESUMPTIVE POSTIVE SARS-CoV-2 nucleic acids MAY BE PRESENT.   A presumptive positive result was obtained on the submitted specimen  and confirmed on repeat testing.  While 2019 novel coronavirus  (SARS-CoV-2) nucleic acids may be present in the submitted sample  additional confirmatory testing may be necessary for epidemiological  and / or clinical management purposes  to differentiate between  SARS-CoV-2 and other Sarbecovirus currently known to infect humans.  If clinically indicated additional testing with an alternate test  methodology [phone]) is advised. The SARS-CoV-2 RNA is generally  detectable in upper and lower respiratory sp ecimens during the acute  phase of infection. The expected result is Negative. Fact Sheet for Patients:  StrictlyIdeas.no Fact Sheet for Healthcare Providers: BankingDealers.co.za This test is not yet approved or cleared by the [place] FDA and has been authorized for detection and/or diagnosis of SARS-CoV-2 by FDA under an Emergency Use Authorization (EUA).  This EUA will remain in effect (meaning this test can be used) for the duration of the COVID-19 declaration under Section 564(b)(1) of the Act, 21 U.S.C. section 360bbb-3(b)(1), unless the authorization is terminated or revoked sooner. Performed at [facility], [address], [place],  [address]     Studies/Results: Dg Foot Complete Right  Result Date: 06/09/2019 CLINICAL DATA:  Medial right foot pain.  No known injury. EXAM: RIGHT FOOT COMPLETE - 3+ VIEW COMPARISON:  None. FINDINGS: There is no evidence of fracture or dislocation. There is no evidence of arthropathy or other focal bone abnormality. Soft tissues are unremarkable. IMPRESSION: Negative. Electronically Signed   By: San Morelle M.D.   On: 06/09/2019 09:56    Medications:  Prior to Admission:  Medications Prior to Admission  Medication Sig Dispense Refill Last Dose  . atorvastatin (LIPITOR) 40 MG tablet Take 40 mg by mouth daily at 6 PM.    06/02/2019  . carvedilol (COREG) 12.5 MG tablet TAKE 1 TABLET BY MOUTH TWICE DAILY. (Patient taking differently: Take 12.5 mg by mouth 2 (two) times daily with a meal. ) 60 tablet 0 5/95/6387 at 5643PI  . folic  acid (FOLVITE) 1 MG tablet Take 1 mg by mouth daily.   06/08/2019 at Unknown time  . furosemide (LASIX) 40 MG tablet Take 40 mg by mouth daily.    06/08/2019 at Unknown time  . lisinopril (ZESTRIL) 10 MG tablet Take 10 mg by mouth daily.    06/08/2019 at Unknown time  . magnesium oxide (MAG-OX) 400 (241.3 Mg) MG tablet Take 1 tablet (400 mg total) by mouth daily. 30 tablet 0 06/08/2019 at Unknown time  . methylPREDNISolone (MEDROL DOSEPAK) 4 MG TBPK tablet Take 4 mg by mouth as directed.    06/07/2019 at Unknown time  . Multiple Vitamins-Minerals (MULTIVITAMIN WITH MINERALS) tablet Take 1 tablet by mouth daily.   06/08/2019 at Unknown time  . nitroGLYCERIN (NITROSTAT) 0.4 MG SL tablet Place 0.4 mg under the tongue as needed.     unknown at unknown  . pantoprazole (PROTONIX) 40 MG tablet TAKE 1 TABLET BY MOUTH ONCE DAILY BEFORE BREAKFAST. (Patient taking differently: Take 40 mg by mouth daily. ) 30 tablet 0 06/08/2019 at Unknown time  . thiamine 100 MG tablet Take 1 tablet (100 mg total) by mouth daily. (Patient taking differently: Take 50 mg by mouth daily. ) 30 tablet 2 06/08/2019  at Unknown time  . colchicine 0.6 MG tablet Take 1 tablet (0.6 mg total) by mouth 2 (two) times daily. 14 tablet 0    Scheduled: . atorvastatin  40 mg Oral q1800  . folic acid  1 mg Oral Daily  . heparin  5,000 Units Subcutaneous Q8H  . magnesium oxide  400 mg Oral Daily  . pantoprazole  40 mg Oral Daily  . thiamine  100 mg Oral Daily   Continuous:  QQI:[id number] **OR** acetaminophen, ondansetron **OR** ondansetron (ZOFRAN) IV, polyethylene glycol  Assesment: He was admitted with acute kidney injury.  His renal function is approximately at baseline now.  He says he feels well.  He wants to go home.  He has coronary disease but no chest pain  He has had weight loss that has been worked up several times and no cause has been found  He was dehydrated on admission and that is improved  He has cognitive impairment that is unchanged Active Problems:   Coronary artery disease   Gastroesophageal reflux disease   Cirrhosis (HCC)   Tobacco abuse   AKI (acute kidney injury) ([place])   Unintentional weight loss   Malnutrition of moderate degree   Hypotension   Volume depletion   Dehydration   Chronic systolic congestive heart failure ([place])   Acute kidney injury ([place])   Hyperkalemia    Plan: Discharge home today with home health services    LOS: 2 days   Alonza Bogus 06/11/2019, 7:56 AM

## 2019-06-11 NOTE — Discharge Summary (Signed)
Physician Discharge Summary  Patient ID: [name] MRN: [id number] DOB/AGE: 1946/02/10 73 y.o. Primary Care Physician:Ajee Heasley, Percell Miller, MD Admit date: 06/08/2019 Discharge date: 06/11/2019    Discharge Diagnoses:   Active Problems:   Coronary artery disease   Gastroesophageal reflux disease   Cirrhosis (HCC)   Tobacco abuse   AKI (acute kidney injury) ([place])   Unintentional weight loss   Malnutrition of moderate degree   Hypotension   Volume depletion   Dehydration   Chronic systolic congestive heart failure (HCC)   Acute kidney injury (HCC)   Hyperkalemia   Cognitive impairment   Allergies as of 06/11/2019   No Known Allergies     Medication List    STOP taking these medications   furosemide 40 MG tablet Commonly known as: LASIX   lisinopril 10 MG tablet Commonly known as: ZESTRIL     TAKE these medications   atorvastatin 40 MG tablet Commonly known as: LIPITOR Take 40 mg by mouth daily at 6 PM.   carvedilol 12.5 MG tablet Commonly known as: COREG TAKE 1 TABLET BY MOUTH TWICE DAILY. What changed: when to take this   colchicine 0.6 MG tablet Take 1 tablet (0.6 mg total) by mouth 2 (two) times daily.   folic acid 1 MG tablet Commonly known as: FOLVITE Take 1 mg by mouth daily.   magnesium oxide 400 (241.3 Mg) MG tablet Commonly known as: MAG-OX Take 1 tablet (400 mg total) by mouth daily.   methylPREDNISolone 4 MG Tbpk tablet Commonly known as: MEDROL DOSEPAK Take 4 mg by mouth as directed.   multivitamin with minerals tablet Take 1 tablet by mouth daily.   nitroGLYCERIN 0.4 MG SL tablet Commonly known as: NITROSTAT Place 0.4 mg under the tongue as needed.   pantoprazole 40 MG tablet Commonly known as: PROTONIX TAKE 1 TABLET BY MOUTH ONCE DAILY BEFORE BREAKFAST. What changed: See the new instructions.   thiamine 100 MG tablet Take 1 tablet (100 mg total) by mouth daily. What changed: how much to take       Discharged Condition:  Improved    Consults: None  Significant Diagnostic Studies: Dg Ankle 2 Views Right  Result Date: 06/08/2019 CLINICAL DATA:  Right ankle pain for a few years.  No known injury. EXAM: RIGHT ANKLE - 2 VIEW COMPARISON:  None. FINDINGS: No acute bony or joint abnormality is identified. Bony prominence along the medial aspect of the midfoot is noted and may be due to patient positioning. Soft tissues appear normal. IMPRESSION: No acute abnormality. Bony prominence along the medial aspect of the midfoot may be due to patient positioning. Dedicated plain films of the foot could be used for further characterization. Electronically Signed   By: Inge Rise M.D.   On: 06/08/2019 21:19   Dg Chest Portable 1 View  Result Date: 06/08/2019 CLINICAL DATA:  Failure to thrive EXAM: PORTABLE CHEST 1 VIEW COMPARISON:  Chest CT dated 11/05/2018. FINDINGS: The heart size and mediastinal contours are within normal limits. Calcifications are seen in the aortic arch. Both lungs are clear. There is cortical thickening and increased density of the posteromedial right ninth rib and the posteromedial left tenth rib. IMPRESSION: No active disease. Electronically Signed   By: Zerita Boers M.D.   On: 06/08/2019 13:32   Dg Foot Complete Right  Result Date: 06/09/2019 CLINICAL DATA:  Medial right foot pain.  No known injury. EXAM: RIGHT FOOT COMPLETE - 3+ VIEW COMPARISON:  None. FINDINGS: There is no evidence of fracture  or dislocation. There is no evidence of arthropathy or other focal bone abnormality. Soft tissues are unremarkable. IMPRESSION: Negative. Electronically Signed   By: San Morelle M.D.   On: 06/09/2019 09:56    Lab Results: Basic Metabolic Panel: Recent Labs    06/08/19 1340  06/10/19 0601 06/11/19 0500  NA  --    < > 139 139  K  --    < > 4.3 4.2  CL  --    < > 110 112*  CO2  --    < > 23 21*  GLUCOSE  --    < > 92 96  BUN  --    < > 37* 25*  CREATININE  --    < > 1.93* 1.70*  CALCIUM   --    < > 7.9* 7.8*  MG 3.0*  --   --   --    < > = values in this interval not displayed.   Liver Function Tests: Recent Labs    06/08/19 1158  AST 25  ALT 20  ALKPHOS 59  BILITOT 0.3  PROT 8.4*  ALBUMIN 3.4*     CBC: Recent Labs    06/08/19 1158 06/09/19 0610  WBC 9.6 6.8  NEUTROABS 7.4  --   HGB 9.0* 8.6*  HCT 28.1* 27.0*  MCV 95.6 95.7  PLT 160 131*    Recent Results (from the past 240 hour(s))  Urine Culture     Status: None   Collection Time: 06/08/19 11:32 AM   Specimen: Urine, Clean Catch  Result Value Ref Range Status   Specimen Description   Final    URINE, CLEAN CATCH Performed at [facility], [address], [place], [place] [address]    Special Requests   Final    NONE Performed at [facility], [address] [place], [place] [address]    Culture   Final    NO GROWTH Performed at [facility], [place] [address] [place], [place] [address]    Report Status 06/09/2019 FINAL  Final  SARS Coronavirus 2 [facility] order, Performed in [facility] hospital lab) Nasopharyngeal Nasopharyngeal Swab     Status: None   Collection Time: 06/08/19  2:17 PM   Specimen: Nasopharyngeal Swab  Result Value Ref Range Status   SARS Coronavirus 2 NEGATIVE NEGATIVE Final    Comment: (NOTE) If result is NEGATIVE SARS-CoV-2 target nucleic acids are NOT DETECTED. The SARS-CoV-2 RNA is generally detectable in upper and lower  respiratory specimens during the acute phase of infection. The lowest  concentration of SARS-CoV-2 viral copies this assay can detect is 250  copies / mL. A negative result does not preclude SARS-CoV-2 infection  and should not be used as the sole basis for treatment or other  patient management decisions.  A negative result may occur with  improper specimen collection / handling, submission of specimen other  than nasopharyngeal swab, presence of viral mutation(s) within the  areas targeted by this assay, and inadequate number of viral copies   (<250 copies / mL). A negative result must be combined with clinical  observations, patient history, and epidemiological information. If result is POSITIVE SARS-CoV-2 target nucleic acids are DETECTED. The SARS-CoV-2 RNA is generally detectable in upper and lower  respiratory specimens dur ing the acute phase of infection.  Positive  results are indicative of active infection with SARS-CoV-2.  Clinical  correlation with patient history and other diagnostic information is  necessary to determine patient infection  status.  Positive results do  not rule out bacterial infection or co-infection with other viruses. If result is PRESUMPTIVE POSTIVE SARS-CoV-2 nucleic acids MAY BE PRESENT.   A presumptive positive result was obtained on the submitted specimen  and confirmed on repeat testing.  While 2019 novel coronavirus  (SARS-CoV-2) nucleic acids may be present in the submitted sample  additional confirmatory testing may be necessary for epidemiological  and / or clinical management purposes  to differentiate between  SARS-CoV-2 and other Sarbecovirus currently known to infect humans.  If clinically indicated additional testing with an alternate test  methodology [phone]) is advised. The SARS-CoV-2 RNA is generally  detectable in upper and lower respiratory sp ecimens during the acute  phase of infection. The expected result is Negative. Fact Sheet for Patients:  StrictlyIdeas.no Fact Sheet for Healthcare Providers: BankingDealers.co.za This test is not yet approved or cleared by the [place] FDA and has been authorized for detection and/or diagnosis of SARS-CoV-2 by FDA under an Emergency Use Authorization (EUA).  This EUA will remain in effect (meaning this test can be used) for the duration of the COVID-19 declaration under Section 564(b)(1) of the Act, 21 U.S.C. section 360bbb-3(b)(1), unless the authorization is terminated  or revoked sooner. Performed at [facility], [address], [place], [place] [address]      Hospital Course: He came to the hospital complaining of foot pain and was found to have acute renal failure.  He was also hyperkalemic.  He appeared to be dehydrated.  He has had unintentional weight loss.  He was still on Lasix apparently.  He was started on IV fluids Lasix and losartan were held.  His potassium came back to normal.  Renal function was at baseline by the time of discharge.  He felt well and wanted to go home.  It was recommended that he have home health physical therapy and nursing.  Discharge Exam: Blood pressure 118/62, pulse 70, temperature 98.1 F (36.7 C), temperature source Oral, resp. rate 18, height 5\' 6"  (1.676 m), weight 63.5 kg, SpO2 100 %. He is awake and alert.  Chest is clear.  Heart is regular  Disposition: Home with home health services  Discharge Instructions    Face-to-face encounter (required for Medicare/Medicaid patients)   Complete by: As directed    I Alonza Bogus certify that this patient is under my care and that I, or a nurse practitioner or physician's assistant working with me, had a face-to-face encounter that meets the physician face-to-face encounter requirements with this patient on 06/11/2019. The encounter with the patient was in whole, or in part for the following medical condition(s) which is the primary reason for home health care (List medical condition): acute kidney injury   The encounter with the patient was in whole, or in part, for the following medical condition, which is the primary reason for home health care: acute kidney injury   I certify that, based on my findings, the following services are medically necessary home health services:  Nursing Physical therapy     Reason for Medically Necessary Home Health Services: Skilled Nursing- Change/Decline in Patient Status   My clinical findings support the need for the above services:  Unable to leave home safely without assistance and/or assistive device   Further, I certify that my clinical findings support that this patient is homebound due to: Unable to leave home safely without assistance   Home Health   Complete by: As directed    To provide  the following care/treatments:  RN PT          Signed: Alonza Bogus   06/11/2019, 7:59 AM

## 2019-06-11 NOTE — Progress Notes (Signed)
Physical Therapy Treatment Patient Details Name: [name] MRN: [id number] DOB: 08-19-1946 Today's Date: 06/11/2019    History of Present Illness [name] is a 73 y.o. male with medical history significant for coronary artery disease with systolic CHF, cirrhosis, gastric ulcer, CKD, presented to the ED with complaints of poor p.o. intake generalized weakness and right ankle pain.  Patient tells me his right ankle has been bothering him intermittently over the past 1 to 2 years at least, and is unchanged.  He denies swelling of his ankle, redness or warmth.  No fevers.  Reports poor p.o. intake from poor appetite over the past 2 weeks. He also reports weight loss.  Per intake notes patient also complained of constipation, but he tells me his last bowel movement was 1 to 2 days ago.  He otherwise denies any other complaints.  No pain with urination of frequency, no cough or difficulty breathing, no vomiting no loose stools.  No black stools, no blood in stools.  He denies taking fluid pills and does not know if he is on lisinopril.  His sister helps him with his medications. Patient was referred to the ED by primary care provider.    PT Comments    Patient has much improvement for tolerating weightbearing on RLE with less c/o pain using RW and demonstrates increased endurance/distance for ambulation in room/hallway without loss of balance.  Patient limited to standing and unable to take steps when attempting to use SPC due increased right foot pain.  Patient advised to use RW when he returns home with understanding acknowledged.  Plan:  Patient to be discharged home today and discharged from physical therapy to care of nursing for ambulation daily as tolerated for length of stay.   Follow Up Recommendations  Home health PT;Supervision for mobility/OOB;Supervision - Intermittent     Equipment Recommendations  Rolling walker with 5" wheels    Recommendations for Other Services        Precautions / Restrictions Precautions Precautions: Fall Restrictions Weight Bearing Restrictions: No    Mobility  Bed Mobility Overal bed mobility: Modified Independent             General bed mobility comments: slightly increased time  Transfers Overall transfer level: Modified independent Equipment used: Rolling walker (2 wheeled) Transfers: Sit to/from [facility] Sit to Stand: Modified independent (Device/Increase time) Stand pivot transfers: Modified independent (Device/Increase time)       General transfer comment: continues to be unsteady attempting to use cane secondary to increased right ankle pain when weightbearing, increased tolerance and safety when using RW  Ambulation/Gait Ambulation/Gait assistance: Supervision Gait Distance (Feet): 55 Feet Assistive device: Rolling walker (2 wheeled) Gait Pattern/deviations: Decreased step length - right;Decreased stance time - right;Decreased stride length Gait velocity: decreased   General Gait Details: increased endurance/distance for ambulation using RW without loss of balance, tolerated increased weightbearing  on right foot with less c/o pain   Stairs             Wheelchair Mobility    Modified Rankin (Stroke Patients Only)       Balance Overall balance assessment: Needs assistance Sitting-balance support: Feet supported;No upper extremity supported Sitting balance-Leahy Scale: Good Sitting balance - Comments: seated at bedside   Standing balance support: During functional activity;Single extremity supported Standing balance-Leahy Scale: Fair Standing balance comment: fair/poor using SPC, fair/good using RW  Cognition Arousal/Alertness: Awake/alert Behavior During Therapy: WFL for tasks assessed/performed Overall Cognitive Status: Within Functional Limits for tasks assessed                                         Exercises      General Comments        Pertinent Vitals/Pain Pain Assessment: Faces Faces Pain Scale: Hurts little more Pain Location: right ankle when attempting weightbearing or end range dorsiflexion Pain Descriptors / Indicators: Sore Pain Intervention(s): Limited activity within patient's tolerance;Monitored during session    Home Living                      Prior Function            PT Goals (current goals can now be found in the care plan section) Acute Rehab PT Goals Patient Stated Goal: return home with family to assist PT Goal Formulation: With patient Time For Goal Achievement: 06/11/19 Potential to Achieve Goals: Good Progress towards PT goals: Progressing toward goals    Frequency           PT Plan Other (comment)(Patient to be discharged home today)    Co-evaluation              AM-PAC PT "6 Clicks" Mobility   Outcome Measure  Help needed turning from your back to your side while in a flat bed without using bedrails?: None Help needed moving from lying on your back to sitting on the side of a flat bed without using bedrails?: None Help needed moving to and from a bed to a chair (including a wheelchair)?: None Help needed standing up from a chair using your arms (e.g., wheelchair or bedside chair)?: None Help needed to walk in hospital room?: A Little Help needed climbing 3-5 steps with a railing? : A Little 6 Click Score: 22    End of Session   Activity Tolerance: Patient tolerated treatment well;Patient limited by fatigue Patient left: in bed;with call bell/phone within reach;with bed alarm set Nurse Communication: Mobility status PT Visit Diagnosis: Unsteadiness on feet (R26.81);Other abnormalities of gait and mobility (R26.89);Muscle weakness (generalized) (M62.81)     Time: 2353-6144 PT Time Calculation (min) (ACUTE ONLY): 14 min  Charges:  $Gait Training: 8-22 mins                     11:22 AM, 06/11/19 Lonell Grandchild, MPT Physical Therapist with [facility] [address] [phone] office [phone] mobile phone

## 2019-06-13 DIAGNOSIS — K219 Gastro-esophageal reflux disease without esophagitis: Secondary | ICD-10-CM | POA: Diagnosis not present

## 2019-06-13 DIAGNOSIS — E440 Moderate protein-calorie malnutrition: Secondary | ICD-10-CM | POA: Diagnosis not present

## 2019-06-13 DIAGNOSIS — K7460 Unspecified cirrhosis of liver: Secondary | ICD-10-CM | POA: Diagnosis not present

## 2019-06-13 DIAGNOSIS — J449 Chronic obstructive pulmonary disease, unspecified: Secondary | ICD-10-CM | POA: Diagnosis not present

## 2019-06-13 DIAGNOSIS — N184 Chronic kidney disease, stage 4 (severe): Secondary | ICD-10-CM | POA: Diagnosis not present

## 2019-06-13 DIAGNOSIS — N179 Acute kidney failure, unspecified: Secondary | ICD-10-CM | POA: Diagnosis not present

## 2019-06-13 DIAGNOSIS — I959 Hypotension, unspecified: Secondary | ICD-10-CM | POA: Diagnosis not present

## 2019-06-13 DIAGNOSIS — D631 Anemia in chronic kidney disease: Secondary | ICD-10-CM | POA: Diagnosis not present

## 2019-06-13 DIAGNOSIS — I5022 Chronic systolic (congestive) heart failure: Secondary | ICD-10-CM | POA: Diagnosis not present

## 2019-06-13 DIAGNOSIS — Z7952 Long term (current) use of systemic steroids: Secondary | ICD-10-CM | POA: Diagnosis not present

## 2019-06-13 DIAGNOSIS — I2510 Atherosclerotic heart disease of native coronary artery without angina pectoris: Secondary | ICD-10-CM | POA: Diagnosis not present

## 2019-06-15 DIAGNOSIS — I2510 Atherosclerotic heart disease of native coronary artery without angina pectoris: Secondary | ICD-10-CM | POA: Diagnosis not present

## 2019-06-15 DIAGNOSIS — K7460 Unspecified cirrhosis of liver: Secondary | ICD-10-CM | POA: Diagnosis not present

## 2019-06-15 DIAGNOSIS — I5022 Chronic systolic (congestive) heart failure: Secondary | ICD-10-CM | POA: Diagnosis not present

## 2019-06-15 DIAGNOSIS — I959 Hypotension, unspecified: Secondary | ICD-10-CM | POA: Diagnosis not present

## 2019-06-15 DIAGNOSIS — K219 Gastro-esophageal reflux disease without esophagitis: Secondary | ICD-10-CM | POA: Diagnosis not present

## 2019-06-15 DIAGNOSIS — D631 Anemia in chronic kidney disease: Secondary | ICD-10-CM | POA: Diagnosis not present

## 2019-06-15 DIAGNOSIS — Z7952 Long term (current) use of systemic steroids: Secondary | ICD-10-CM | POA: Diagnosis not present

## 2019-06-15 DIAGNOSIS — N184 Chronic kidney disease, stage 4 (severe): Secondary | ICD-10-CM | POA: Diagnosis not present

## 2019-06-15 DIAGNOSIS — E440 Moderate protein-calorie malnutrition: Secondary | ICD-10-CM | POA: Diagnosis not present

## 2019-06-15 DIAGNOSIS — J449 Chronic obstructive pulmonary disease, unspecified: Secondary | ICD-10-CM | POA: Diagnosis not present

## 2019-06-15 DIAGNOSIS — N179 Acute kidney failure, unspecified: Secondary | ICD-10-CM | POA: Diagnosis not present

## 2019-06-16 ENCOUNTER — Other Ambulatory Visit (INDEPENDENT_AMBULATORY_CARE_PROVIDER_SITE_OTHER): Payer: Self-pay | Admitting: Internal Medicine

## 2019-06-18 DIAGNOSIS — I5022 Chronic systolic (congestive) heart failure: Secondary | ICD-10-CM | POA: Diagnosis not present

## 2019-06-18 DIAGNOSIS — K7460 Unspecified cirrhosis of liver: Secondary | ICD-10-CM | POA: Diagnosis not present

## 2019-06-18 DIAGNOSIS — N184 Chronic kidney disease, stage 4 (severe): Secondary | ICD-10-CM | POA: Diagnosis not present

## 2019-06-18 DIAGNOSIS — N179 Acute kidney failure, unspecified: Secondary | ICD-10-CM | POA: Diagnosis not present

## 2019-06-18 DIAGNOSIS — I2510 Atherosclerotic heart disease of native coronary artery without angina pectoris: Secondary | ICD-10-CM | POA: Diagnosis not present

## 2019-06-18 DIAGNOSIS — Z7952 Long term (current) use of systemic steroids: Secondary | ICD-10-CM | POA: Diagnosis not present

## 2019-06-18 DIAGNOSIS — J449 Chronic obstructive pulmonary disease, unspecified: Secondary | ICD-10-CM | POA: Diagnosis not present

## 2019-06-18 DIAGNOSIS — I959 Hypotension, unspecified: Secondary | ICD-10-CM | POA: Diagnosis not present

## 2019-06-18 DIAGNOSIS — E440 Moderate protein-calorie malnutrition: Secondary | ICD-10-CM | POA: Diagnosis not present

## 2019-06-18 DIAGNOSIS — D631 Anemia in chronic kidney disease: Secondary | ICD-10-CM | POA: Diagnosis not present

## 2019-06-18 DIAGNOSIS — K219 Gastro-esophageal reflux disease without esophagitis: Secondary | ICD-10-CM | POA: Diagnosis not present

## 2019-06-21 DIAGNOSIS — K7460 Unspecified cirrhosis of liver: Secondary | ICD-10-CM | POA: Diagnosis not present

## 2019-06-21 DIAGNOSIS — K219 Gastro-esophageal reflux disease without esophagitis: Secondary | ICD-10-CM | POA: Diagnosis not present

## 2019-06-21 DIAGNOSIS — J449 Chronic obstructive pulmonary disease, unspecified: Secondary | ICD-10-CM | POA: Diagnosis not present

## 2019-06-21 DIAGNOSIS — N184 Chronic kidney disease, stage 4 (severe): Secondary | ICD-10-CM | POA: Diagnosis not present

## 2019-06-21 DIAGNOSIS — E440 Moderate protein-calorie malnutrition: Secondary | ICD-10-CM | POA: Diagnosis not present

## 2019-06-21 DIAGNOSIS — N179 Acute kidney failure, unspecified: Secondary | ICD-10-CM | POA: Diagnosis not present

## 2019-06-21 DIAGNOSIS — Z7952 Long term (current) use of systemic steroids: Secondary | ICD-10-CM | POA: Diagnosis not present

## 2019-06-21 DIAGNOSIS — I2510 Atherosclerotic heart disease of native coronary artery without angina pectoris: Secondary | ICD-10-CM | POA: Diagnosis not present

## 2019-06-21 DIAGNOSIS — D631 Anemia in chronic kidney disease: Secondary | ICD-10-CM | POA: Diagnosis not present

## 2019-06-21 DIAGNOSIS — I5022 Chronic systolic (congestive) heart failure: Secondary | ICD-10-CM | POA: Diagnosis not present

## 2019-06-21 DIAGNOSIS — I959 Hypotension, unspecified: Secondary | ICD-10-CM | POA: Diagnosis not present

## 2019-06-22 DIAGNOSIS — I959 Hypotension, unspecified: Secondary | ICD-10-CM | POA: Diagnosis not present

## 2019-06-22 DIAGNOSIS — N179 Acute kidney failure, unspecified: Secondary | ICD-10-CM | POA: Diagnosis not present

## 2019-06-22 DIAGNOSIS — K7460 Unspecified cirrhosis of liver: Secondary | ICD-10-CM | POA: Diagnosis not present

## 2019-06-22 DIAGNOSIS — E440 Moderate protein-calorie malnutrition: Secondary | ICD-10-CM | POA: Diagnosis not present

## 2019-06-22 DIAGNOSIS — J449 Chronic obstructive pulmonary disease, unspecified: Secondary | ICD-10-CM | POA: Diagnosis not present

## 2019-06-22 DIAGNOSIS — I2510 Atherosclerotic heart disease of native coronary artery without angina pectoris: Secondary | ICD-10-CM | POA: Diagnosis not present

## 2019-06-22 DIAGNOSIS — D631 Anemia in chronic kidney disease: Secondary | ICD-10-CM | POA: Diagnosis not present

## 2019-06-22 DIAGNOSIS — Z7952 Long term (current) use of systemic steroids: Secondary | ICD-10-CM | POA: Diagnosis not present

## 2019-06-22 DIAGNOSIS — K219 Gastro-esophageal reflux disease without esophagitis: Secondary | ICD-10-CM | POA: Diagnosis not present

## 2019-06-22 DIAGNOSIS — N184 Chronic kidney disease, stage 4 (severe): Secondary | ICD-10-CM | POA: Diagnosis not present

## 2019-06-22 DIAGNOSIS — I5022 Chronic systolic (congestive) heart failure: Secondary | ICD-10-CM | POA: Diagnosis not present

## 2019-06-24 DIAGNOSIS — N179 Acute kidney failure, unspecified: Secondary | ICD-10-CM | POA: Diagnosis not present

## 2019-06-24 DIAGNOSIS — E440 Moderate protein-calorie malnutrition: Secondary | ICD-10-CM | POA: Diagnosis not present

## 2019-06-24 DIAGNOSIS — J449 Chronic obstructive pulmonary disease, unspecified: Secondary | ICD-10-CM | POA: Diagnosis not present

## 2019-06-24 DIAGNOSIS — I5022 Chronic systolic (congestive) heart failure: Secondary | ICD-10-CM | POA: Diagnosis not present

## 2019-06-24 DIAGNOSIS — I959 Hypotension, unspecified: Secondary | ICD-10-CM | POA: Diagnosis not present

## 2019-06-24 DIAGNOSIS — D631 Anemia in chronic kidney disease: Secondary | ICD-10-CM | POA: Diagnosis not present

## 2019-06-24 DIAGNOSIS — N184 Chronic kidney disease, stage 4 (severe): Secondary | ICD-10-CM | POA: Diagnosis not present

## 2019-06-24 DIAGNOSIS — K7460 Unspecified cirrhosis of liver: Secondary | ICD-10-CM | POA: Diagnosis not present

## 2019-06-24 DIAGNOSIS — Z7952 Long term (current) use of systemic steroids: Secondary | ICD-10-CM | POA: Diagnosis not present

## 2019-06-24 DIAGNOSIS — I2510 Atherosclerotic heart disease of native coronary artery without angina pectoris: Secondary | ICD-10-CM | POA: Diagnosis not present

## 2019-06-24 DIAGNOSIS — K219 Gastro-esophageal reflux disease without esophagitis: Secondary | ICD-10-CM | POA: Diagnosis not present

## 2019-06-28 DIAGNOSIS — Z7952 Long term (current) use of systemic steroids: Secondary | ICD-10-CM | POA: Diagnosis not present

## 2019-06-28 DIAGNOSIS — E440 Moderate protein-calorie malnutrition: Secondary | ICD-10-CM | POA: Diagnosis not present

## 2019-06-28 DIAGNOSIS — K7460 Unspecified cirrhosis of liver: Secondary | ICD-10-CM | POA: Diagnosis not present

## 2019-06-28 DIAGNOSIS — N179 Acute kidney failure, unspecified: Secondary | ICD-10-CM | POA: Diagnosis not present

## 2019-06-28 DIAGNOSIS — J449 Chronic obstructive pulmonary disease, unspecified: Secondary | ICD-10-CM | POA: Diagnosis not present

## 2019-06-28 DIAGNOSIS — K219 Gastro-esophageal reflux disease without esophagitis: Secondary | ICD-10-CM | POA: Diagnosis not present

## 2019-06-28 DIAGNOSIS — D631 Anemia in chronic kidney disease: Secondary | ICD-10-CM | POA: Diagnosis not present

## 2019-06-28 DIAGNOSIS — I2510 Atherosclerotic heart disease of native coronary artery without angina pectoris: Secondary | ICD-10-CM | POA: Diagnosis not present

## 2019-06-28 DIAGNOSIS — I959 Hypotension, unspecified: Secondary | ICD-10-CM | POA: Diagnosis not present

## 2019-06-28 DIAGNOSIS — N184 Chronic kidney disease, stage 4 (severe): Secondary | ICD-10-CM | POA: Diagnosis not present

## 2019-06-28 DIAGNOSIS — I5022 Chronic systolic (congestive) heart failure: Secondary | ICD-10-CM | POA: Diagnosis not present

## 2019-06-29 DIAGNOSIS — N184 Chronic kidney disease, stage 4 (severe): Secondary | ICD-10-CM | POA: Diagnosis not present

## 2019-06-29 DIAGNOSIS — K7460 Unspecified cirrhosis of liver: Secondary | ICD-10-CM | POA: Diagnosis not present

## 2019-06-29 DIAGNOSIS — I5022 Chronic systolic (congestive) heart failure: Secondary | ICD-10-CM | POA: Diagnosis not present

## 2019-06-29 DIAGNOSIS — K219 Gastro-esophageal reflux disease without esophagitis: Secondary | ICD-10-CM | POA: Diagnosis not present

## 2019-06-29 DIAGNOSIS — E440 Moderate protein-calorie malnutrition: Secondary | ICD-10-CM | POA: Diagnosis not present

## 2019-06-29 DIAGNOSIS — D631 Anemia in chronic kidney disease: Secondary | ICD-10-CM | POA: Diagnosis not present

## 2019-06-29 DIAGNOSIS — I959 Hypotension, unspecified: Secondary | ICD-10-CM | POA: Diagnosis not present

## 2019-06-29 DIAGNOSIS — J449 Chronic obstructive pulmonary disease, unspecified: Secondary | ICD-10-CM | POA: Diagnosis not present

## 2019-06-29 DIAGNOSIS — I2510 Atherosclerotic heart disease of native coronary artery without angina pectoris: Secondary | ICD-10-CM | POA: Diagnosis not present

## 2019-06-29 DIAGNOSIS — Z7952 Long term (current) use of systemic steroids: Secondary | ICD-10-CM | POA: Diagnosis not present

## 2019-06-29 DIAGNOSIS — N179 Acute kidney failure, unspecified: Secondary | ICD-10-CM | POA: Diagnosis not present

## 2019-07-02 DIAGNOSIS — K7460 Unspecified cirrhosis of liver: Secondary | ICD-10-CM | POA: Diagnosis not present

## 2019-07-02 DIAGNOSIS — Z7952 Long term (current) use of systemic steroids: Secondary | ICD-10-CM | POA: Diagnosis not present

## 2019-07-02 DIAGNOSIS — I2510 Atherosclerotic heart disease of native coronary artery without angina pectoris: Secondary | ICD-10-CM | POA: Diagnosis not present

## 2019-07-02 DIAGNOSIS — N184 Chronic kidney disease, stage 4 (severe): Secondary | ICD-10-CM | POA: Diagnosis not present

## 2019-07-02 DIAGNOSIS — N179 Acute kidney failure, unspecified: Secondary | ICD-10-CM | POA: Diagnosis not present

## 2019-07-02 DIAGNOSIS — J449 Chronic obstructive pulmonary disease, unspecified: Secondary | ICD-10-CM | POA: Diagnosis not present

## 2019-07-02 DIAGNOSIS — K219 Gastro-esophageal reflux disease without esophagitis: Secondary | ICD-10-CM | POA: Diagnosis not present

## 2019-07-02 DIAGNOSIS — I959 Hypotension, unspecified: Secondary | ICD-10-CM | POA: Diagnosis not present

## 2019-07-02 DIAGNOSIS — I5022 Chronic systolic (congestive) heart failure: Secondary | ICD-10-CM | POA: Diagnosis not present

## 2019-07-02 DIAGNOSIS — E440 Moderate protein-calorie malnutrition: Secondary | ICD-10-CM | POA: Diagnosis not present

## 2019-07-02 DIAGNOSIS — D631 Anemia in chronic kidney disease: Secondary | ICD-10-CM | POA: Diagnosis not present

## 2019-07-06 DIAGNOSIS — J449 Chronic obstructive pulmonary disease, unspecified: Secondary | ICD-10-CM | POA: Diagnosis not present

## 2019-07-06 DIAGNOSIS — K7460 Unspecified cirrhosis of liver: Secondary | ICD-10-CM | POA: Diagnosis not present

## 2019-07-06 DIAGNOSIS — E42 Marasmic kwashiorkor: Secondary | ICD-10-CM | POA: Diagnosis not present

## 2019-07-29 ENCOUNTER — Other Ambulatory Visit: Payer: Self-pay

## 2019-07-29 NOTE — Patient Outreach (Signed)
[facility] [facility]) Care Management  07/29/2019  [name] [birth year] [id number]   Medication Adherence call to [name] Hippa Identifiers Verify spoke with patients sister,patient is past due on Lisinopril 10 mg,patient sister explain patient is no longer taking this medication doctor took him off.[name] is showing past due under [place].   [place] Management Direct Dial [phone]  Fax [phone] [name]

## 2019-08-21 ENCOUNTER — Other Ambulatory Visit: Payer: Self-pay

## 2019-08-21 ENCOUNTER — Encounter (HOSPITAL_COMMUNITY): Payer: Self-pay | Admitting: Emergency Medicine

## 2019-08-21 ENCOUNTER — Emergency Department (HOSPITAL_COMMUNITY): Payer: Medicare Other

## 2019-08-21 ENCOUNTER — Emergency Department (HOSPITAL_COMMUNITY)
Admission: EM | Admit: 2019-08-21 | Discharge: 2019-08-21 | Disposition: A | Discharge status: Home / Self Care | Payer: Medicare Other | Attending: Emergency Medicine | Admitting: Emergency Medicine

## 2019-08-21 DIAGNOSIS — M79632 Pain in left forearm: Secondary | ICD-10-CM | POA: Diagnosis not present

## 2019-08-21 DIAGNOSIS — I2510 Atherosclerotic heart disease of native coronary artery without angina pectoris: Secondary | ICD-10-CM | POA: Insufficient documentation

## 2019-08-21 DIAGNOSIS — S6992XA Unspecified injury of left wrist, hand and finger(s), initial encounter: Secondary | ICD-10-CM | POA: Diagnosis not present

## 2019-08-21 DIAGNOSIS — M7989 Other specified soft tissue disorders: Secondary | ICD-10-CM | POA: Diagnosis not present

## 2019-08-21 DIAGNOSIS — F17210 Nicotine dependence, cigarettes, uncomplicated: Secondary | ICD-10-CM | POA: Diagnosis not present

## 2019-08-21 DIAGNOSIS — I10 Essential (primary) hypertension: Secondary | ICD-10-CM | POA: Diagnosis not present

## 2019-08-21 DIAGNOSIS — M25522 Pain in left elbow: Secondary | ICD-10-CM | POA: Diagnosis not present

## 2019-08-21 DIAGNOSIS — S59912A Unspecified injury of left forearm, initial encounter: Secondary | ICD-10-CM | POA: Diagnosis not present

## 2019-08-21 DIAGNOSIS — N183 Chronic kidney disease, stage 3 unspecified: Secondary | ICD-10-CM | POA: Diagnosis not present

## 2019-08-21 DIAGNOSIS — R52 Pain, unspecified: Secondary | ICD-10-CM | POA: Diagnosis not present

## 2019-08-21 DIAGNOSIS — W19XXXA Unspecified fall, initial encounter: Secondary | ICD-10-CM | POA: Diagnosis not present

## 2019-08-21 DIAGNOSIS — M25532 Pain in left wrist: Secondary | ICD-10-CM | POA: Insufficient documentation

## 2019-08-21 DIAGNOSIS — S59902A Unspecified injury of left elbow, initial encounter: Secondary | ICD-10-CM | POA: Diagnosis not present

## 2019-08-21 DIAGNOSIS — R609 Edema, unspecified: Secondary | ICD-10-CM | POA: Diagnosis not present

## 2019-08-21 DIAGNOSIS — M25539 Pain in unspecified wrist: Secondary | ICD-10-CM | POA: Diagnosis not present

## 2019-08-21 MED ORDER — OXYCODONE HCL 5 MG PO TABS
5.0000 mg | ORAL_TABLET | Freq: Once | ORAL | Status: AC
  Administered 2019-08-21: 17:00:00 5 mg via ORAL
  Filled 2019-08-21: qty 1

## 2019-08-21 NOTE — ED Triage Notes (Signed)
Pain to LT wrist after Pt tripped and fell.  Pt has swelling to wrist prior to fall d/t gout. Currently taking gout meds

## 2019-08-21 NOTE — Discharge Instructions (Addendum)
You were evaluated in the Emergency Department and after careful evaluation, we did not find any emergent condition requiring admission or further testing in the hospital.  Your exam/testing today was overall reassuring.  We suspect that most of your pain is related to gout.  Please take the prednisone medication as directed.  We also recommend follow-up with the orthopedic specialist for repeat x-ray in 2 weeks.  Please call the office provided.  Please return to the Emergency Department if you experience any worsening of your condition.  We encourage you to follow up with a primary care provider.  Thank you for allowing [place] to be a part of your care.

## 2019-08-21 NOTE — ED Provider Notes (Signed)
[facility] Emergency Department Provider Note MRN:  [id number]  Arrival date & time: 08/21/19     Chief Complaint   Wrist Pain   History of Present Illness   [name] is a 73 y.o. year-old male with a history of CAD, CKD, cirrhosis presenting to the ED with chief complaint of wrist pain.  Fall onto outstretched wrist today.  History of gout with recent flare to the same wrist.  Currently on gout medications.  Denies head trauma, no loss consciousness, no neck pain, no chest pain, no shortness of breath, no abdominal pain, no numbness or weakness to the arms or legs.  Review of Systems  A complete 10 system review of systems was obtained and all systems are negative except as noted in the HPI and PMH.   Patient's Health History    Past Medical History:  Diagnosis Date  . Arteriosclerotic cardiovascular disease (ASCVD)    IMI in 5/07 with CTO mid cx, 60% LAD, 99% RCA-> BMS; mod. impaired LV function; EF:35-40% 2/10  . Chronic kidney disease    STAGE 3-4-creatinine 2.5 in 2009; 1.7 in 10/2008  . Cirrhosis ([place])    History of excessive alcohol use; continuing social use  . Gastroesophageal reflux disease   . Hyperlipidemia   . Tobacco abuse    60 pack years; one pack per day    Past Surgical History:  Procedure Laterality Date  . BIOPSY  12/05/2018   Procedure: BIOPSY;  Surgeon: Rogene Houston, MD;  [facility];  Service: Endoscopy;;  gastric  . ESOPHAGOGASTRODUODENOSCOPY N/A 12/19/2016   Procedure: ESOPHAGOGASTRODUODENOSCOPY (EGD);  Surgeon: Rogene Houston, MD;  [facility];  Service: Endoscopy;  Laterality: N/A;  . ESOPHAGOGASTRODUODENOSCOPY N/A 12/21/2016   Procedure: ESOPHAGOGASTRODUODENOSCOPY (EGD);  Surgeon: Danie Binder, MD;  [facility];  Service: Endoscopy;  Laterality: N/A;  . ESOPHAGOGASTRODUODENOSCOPY (EGD) WITH PROPOFOL N/A 12/22/2016   Procedure: ESOPHAGOGASTRODUODENOSCOPY (EGD) WITH PROPOFOL;   Surgeon: Mauri Pole, MD;  Location: [place] [facility];  Service: Endoscopy;  Laterality: N/A;  . ESOPHAGOGASTRODUODENOSCOPY (EGD) WITH PROPOFOL N/A 12/05/2018   Procedure: ESOPHAGOGASTRODUODENOSCOPY (EGD) WITH PROPOFOL;  Surgeon: Rogene Houston, MD;  [facility];  Service: Endoscopy;  Laterality: N/A;  1:35    Family History  Problem Relation Age of Onset  . Other Brother     Social History   Socioeconomic History  . Marital status: Single    Spouse name: Not on file  . Number of children: Not on file  . Years of education: Not on file  . Highest education level: Not on file  Occupational History  . Occupation: Retired  Scientific laboratory technician  . Financial resource strain: Not on file  . Food insecurity    Worry: Not on file    Inability: Not on file  . Transportation needs    Medical: Not on file    Non-medical: Not on file  Tobacco Use  . Smoking status: Current Every Day Smoker    Packs/day: 1.00    Years: 60.00    Pack years: 60.00  . Smokeless tobacco: Never Used  Substance and Sexual Activity  . Alcohol use: Yes    Comment: per sister "he had one beer last week"  . Drug use: No  . Sexual activity: Not on file  Lifestyle  . Physical activity    Days per week: Not on file    Minutes per session: Not on file  . Stress:  Not on file  Relationships  . Social Herbalist on phone: Not on file    Gets together: Not on file    Attends religious service: Not on file    Active member of club or organization: Not on file    Attends meetings of clubs or organizations: Not on file    Relationship status: Not on file  . Intimate partner violence    Fear of current or ex partner: Not on file    Emotionally abused: Not on file    Physically abused: Not on file    Forced sexual activity: Not on file  Other Topics Concern  . Not on file  Social History Narrative   Resides in supervised living situation   No regular exercise     Physical Exam  Vital  Signs and Nursing Notes reviewed Vitals:   08/21/19 1600  BP: 112/68  Pulse: 83  Resp: 17  Temp: 99.1 F (37.3 C)  SpO2: 99%    CONSTITUTIONAL: Well-appearing, NAD NEURO:  Alert and oriented x 3, no focal deficits EYES:  eyes equal and reactive ENT/NECK:  no LAD, no JVD CARDIO: Regular rate, well-perfused, normal S1 and S2 PULM:  CTAB no wheezing or rhonchi GI/GU:  normal bowel sounds, non-distended, non-tender MSK/SPINE:  No gross deformities, no edema; swelling and tenderness to the left hand, left wrist, exquisite tenderness to palpation SKIN:  no rash, atraumatic PSYCH:  Appropriate speech and behavior  Diagnostic and Interventional Summary    EKG Interpretation  Date/Time:    Ventricular Rate:    PR Interval:    QRS Duration:   QT Interval:    QTC Calculation:   R Axis:     Text Interpretation:        Labs Reviewed - No data to display  DG Hand Complete Left  Final Result    DG Forearm Left  Final Result    DG Elbow Complete Left  Final Result    DG Wrist Complete Left  Final Result      Medications  oxyCODONE (Oxy IR/ROXICODONE) immediate release tablet 5 mg (5 mg Oral Given 08/21/19 1659)     Procedures  /  Critical Care Procedures  ED Course and Medical Decision Making  I have reviewed the triage vital signs and the nursing notes.  Pertinent labs & imaging results that were available during my care of the patient were reviewed by me and considered in my medical decision making (see below for details).     Exam seems more consistent with a gout flare, no fever, doubt septic joint, will x-ray to screen for traumatic injury.  X-ray without evidence of fracture.  Upon reevaluation patient continues to have exquisite tenderness favored to be due to gout, however patient specifically has snuffbox tenderness and sister at bedside thinks that this site of pain is new.  Will put in thumb spica to cover for possible scaphoid fracture, referred to  orthopedics.  Patient has prednisone prescription at home for his gout, prescribed by PCP, has not started it yet.  Barth Kirks. Sedonia Small, Padroni [email or web address]  Final Clinical Impressions(s) / ED Diagnoses     ICD-10-CM   1. Left wrist pain  M25.532     ED Discharge Orders    None       Discharge Instructions Discussed with and Provided to Patient:     Discharge Instructions     You were evaluated in  the Emergency Department and after careful evaluation, we did not find any emergent condition requiring admission or further testing in the hospital.  Your exam/testing today was overall reassuring.  We suspect that most of your pain is related to gout.  Please take the prednisone medication as directed.  We also recommend follow-up with the orthopedic specialist for repeat x-ray in 2 weeks.  Please call the office provided.  Please return to the Emergency Department if you experience any worsening of your condition.  We encourage you to follow up with a primary care provider.  Thank you for allowing [place] to be a part of your care.        Maudie Flakes, MD 08/21/19 [phone]

## 2019-08-27 ENCOUNTER — Other Ambulatory Visit (INDEPENDENT_AMBULATORY_CARE_PROVIDER_SITE_OTHER): Payer: Self-pay | Admitting: Internal Medicine

## 2019-09-14 ENCOUNTER — Ambulatory Visit (INDEPENDENT_AMBULATORY_CARE_PROVIDER_SITE_OTHER): Payer: Medicare Other | Admitting: Family Medicine

## 2019-09-14 ENCOUNTER — Other Ambulatory Visit: Payer: Self-pay | Admitting: Internal Medicine

## 2019-09-14 ENCOUNTER — Other Ambulatory Visit: Payer: Self-pay

## 2019-09-14 ENCOUNTER — Encounter (INDEPENDENT_AMBULATORY_CARE_PROVIDER_SITE_OTHER): Payer: Self-pay

## 2019-09-14 ENCOUNTER — Encounter: Payer: Self-pay | Admitting: Family Medicine

## 2019-09-14 VITALS — BP 99/70 | HR 87 | Temp 98.0°F | Ht 66.0 in | Wt 110.2 lb

## 2019-09-14 DIAGNOSIS — M109 Gout, unspecified: Secondary | ICD-10-CM

## 2019-09-14 DIAGNOSIS — D649 Anemia, unspecified: Secondary | ICD-10-CM

## 2019-09-14 DIAGNOSIS — E875 Hyperkalemia: Secondary | ICD-10-CM

## 2019-09-14 DIAGNOSIS — N184 Chronic kidney disease, stage 4 (severe): Secondary | ICD-10-CM | POA: Diagnosis not present

## 2019-09-14 NOTE — Patient Instructions (Addendum)
labwork asap

## 2019-09-14 NOTE — Progress Notes (Signed)
New Patient Office Visit  Subjective:  Patient ID: [name], male    DOB: [birth year]  Age: 73 y.o. MRN: [id number]  CC:  Chief Complaint  Patient presents with  . Establish Care  . Gout    Left Hand taking prednisone   ER note Exam seems more consistent with a gout flare, no fever, doubt septic joint, will x-ray to screen for traumatic injury. X-ray without evidence of fracture.  Upon reevaluation patient continues to have exquisite tenderness favored to be due to gout, however patient specifically has snuffbox tenderness and sister at bedside thinks that this site of pain is new.  Will put in thumb spica to cover for possible scaphoid fracture, referred to orthopedics.  Patient has prednisone prescription at home for his gout, xrays reviewed-swelling, no fx HPI [name] presents for anemia, CKD, GERD, Cirrhosis,  Left wrist with swelling-improved with prednisone-seen at ER-diagnosis gout-stopped allopurinol due to renal concerns CAD-stents Past Medical History:  Diagnosis Date  . Arteriosclerotic cardiovascular disease (ASCVD)    IMI in 5/07 with CTO mid cx, 60% LAD, 99% RCA-> BMS; mod. impaired LV function; EF:35-40% 2/10  . Chronic kidney disease    STAGE 3-4-creatinine 2.5 in 2009; 1.7 in 10/2008  . Cirrhosis ([place])    History of excessive alcohol use; continuing social use  . Gastroesophageal reflux disease   . Hyperlipidemia   . Tobacco abuse    60 pack years; one pack per day    Past Surgical History:  Procedure Laterality Date  . BIOPSY  12/05/2018   Procedure: BIOPSY;  Surgeon: Rogene Houston, MD;  [facility];  Service: Endoscopy;;  gastric  . ESOPHAGOGASTRODUODENOSCOPY N/A 12/19/2016   Procedure: ESOPHAGOGASTRODUODENOSCOPY (EGD);  Surgeon: Rogene Houston, MD;  [facility];  Service: Endoscopy;  Laterality: N/A;  . ESOPHAGOGASTRODUODENOSCOPY N/A 12/21/2016   Procedure: ESOPHAGOGASTRODUODENOSCOPY (EGD);  Surgeon: Danie Binder, MD;  [facility];  Service: Endoscopy;  Laterality: N/A;  . ESOPHAGOGASTRODUODENOSCOPY (EGD) WITH PROPOFOL N/A 12/22/2016   Procedure: ESOPHAGOGASTRODUODENOSCOPY (EGD) WITH PROPOFOL;  Surgeon: Mauri Pole, MD;  Location: [place] [facility];  Service: Endoscopy;  Laterality: N/A;  . ESOPHAGOGASTRODUODENOSCOPY (EGD) WITH PROPOFOL N/A 12/05/2018   Procedure: ESOPHAGOGASTRODUODENOSCOPY (EGD) WITH PROPOFOL;  Surgeon: Rogene Houston, MD;  [facility];  Service: Endoscopy;  Laterality: N/A;  1:35    Family History  Problem Relation Age of Onset  . Other Brother     Social History  Sister cares for pt Socioeconomic History  . Marital status: Single    Spouse name: Not on file  . Number of children: Not on file  . Years of education: Not on file  . Highest education level: Not on file  Occupational History  . Occupation: Retired  Tobacco Use  . Smoking status: Current Every Day Smoker    Packs/day: 1.00    Years: 60.00    Pack years: 60.00  . Smokeless tobacco: Never Used  Substance and Sexual Activity  . Alcohol use: Yes    Comment: per sister "he had one beer last week"  . Drug use: No  . Sexual activity: Not on file  Other Topics Concern  . Not on file  Social History Narrative   Resides in supervised living situation   No regular exercise   Social Determinants of Health   Financial Resource Strain:   . Difficulty of Paying Living Expenses: Not on file  Food Insecurity:   .  Worried About Charity fundraiser in the Last Year: Not on file  . Ran Out of Food in the Last Year: Not on file  Transportation Needs:   . Lack of Transportation (Medical): Not on file  . Lack of Transportation (Non-Medical): Not on file  Physical Activity:   . Days of Exercise per Week: Not on file  . Minutes of Exercise per Session: Not on file  Stress:   . Feeling of Stress : Not on file  Social Connections:   . Frequency of Communication with Friends and Family:  Not on file  . Frequency of Social Gatherings with Friends and Family: Not on file  . Attends Religious Services: Not on file  . Active Member of Clubs or Organizations: Not on file  . Attends Archivist Meetings: Not on file  . Marital Status: Not on file  Intimate Partner Violence:   . Fear of Current or Ex-Partner: Not on file  . Emotionally Abused: Not on file  . Physically Abused: Not on file  . Sexually Abused: Not on file    ROS Review of Systems  Constitutional: Negative for fever.  Respiratory: Negative.        Tob abuse 60 + pk/years  Cardiovascular:       CAD  Gastrointestinal: Negative.   Musculoskeletal: Positive for joint swelling.  Neurological: Negative.   Psychiatric/Behavioral: Negative.     Objective:   Today's Vitals: BP 99/70 (BP Location: Left Arm, Patient Position: Sitting, Cuff Size: Small)   Pulse 87   Temp 98 F (36.7 C) (Oral)   Ht 5\' 6"  (1.676 m)   Wt 110 lb 3.2 oz (50 kg)   SpO2 97%   BMI 17.79 kg/m   Physical Exam HENT:     Head: Normocephalic and atraumatic.  Cardiovascular:     Rate and Rhythm: Normal rate and regular rhythm.     Pulses: Normal pulses.     Heart sounds: Normal heart sounds.  Musculoskeletal:        General: Tenderness present.     Cervical back: Normal range of motion and neck supple.     Comments: Left wrist-tenderness to movement, palpation  Neurological:     Mental Status: He is alert and oriented to person, place, and time.  Psychiatric:        Behavior: Behavior normal.     Assessment & Plan:  1. Hyperkalemia cmp-concern for additional medication with renal concerns 2. Anemia, normocytic normochromic - CBC with Differential  3. Stage 4 chronic kidney disease (HCC) - COMPLETE METABOLIC PANEL WITH GFR  4. Acute gout of left hand, unspecified cause D/w pt concern for renal disease and used of NSIADS-allopurinol stopped-colchine used in the past-recent er visit with prednisone  rx  Outpatient Encounter Medications as of 09/14/2019  Medication Sig  . allopurinol (ZYLOPRIM) 100 MG tablet Take 100 mg by mouth daily.  . vitamin B-12 (CYANOCOBALAMIN) 50 MCG tablet Take 50 mcg by mouth daily.  [name] atorvastatin (LIPITOR) 40 MG tablet Take 40 mg by mouth daily at 6 PM.   . carvedilol (COREG) 12.5 MG tablet TAKE 1 TABLET BY MOUTH TWICE DAILY. (Patient taking differently: Take 12.5 mg by mouth 2 (two) times daily with a meal. )  . colchicine 0.6 MG tablet Take 1 tablet (0.6 mg total) by mouth 2 (two) times daily.  . folic acid (FOLVITE) 1 MG tablet Take 1 mg by mouth daily.  . magnesium oxide (MAG-OX) 400 (241.3 Mg) MG tablet  Take 1 tablet (400 mg total) by mouth daily.  . methylPREDNISolone (MEDROL DOSEPAK) 4 MG TBPK tablet Take 4 mg by mouth as directed.   . Multiple Vitamins-Minerals (MULTIVITAMIN WITH MINERALS) tablet Take 1 tablet by mouth daily.  . nitroGLYCERIN (NITROSTAT) 0.4 MG SL tablet Place 0.4 mg under the tongue as needed.    . pantoprazole (PROTONIX) 40 MG tablet TAKE 1 TABLET BY MOUTH ONCE DAILY BEFORE BREAKFAST.  [name] thiamine 100 MG tablet Take 1 tablet (100 mg total) by mouth daily. (Patient taking differently: Take 50 mg by mouth daily. )   No facility-administered encounter medications on file as of 09/14/2019.    Follow-up: complete blood work-will call on results  Shevonne Wolf Hannah Beat, MD

## 2019-09-15 LAB — CBC/DIFF AMBIGUOUS DEFAULT
Basophils Absolute: 0 10*3/uL (ref 0.0–0.2)
Basos: 1 %
EOS (ABSOLUTE): 0.1 10*3/uL (ref 0.0–0.4)
Eos: 3 %
Hematocrit: 34.3 % — ABNORMAL LOW (ref 37.5–51.0)
Hemoglobin: 11.5 g/dL — ABNORMAL LOW (ref 13.0–17.7)
Immature Grans (Abs): 0 10*3/uL (ref 0.0–0.1)
Immature Granulocytes: 0 %
Lymphocytes Absolute: 1.4 10*3/uL (ref 0.7–3.1)
Lymphs: 34 %
MCH: 29.6 pg (ref 26.6–33.0)
MCHC: 33.5 g/dL (ref 31.5–35.7)
MCV: 88 fL (ref 79–97)
Monocytes Absolute: 0.4 10*3/uL (ref 0.1–0.9)
Monocytes: 9 %
Neutrophils Absolute: 2.2 10*3/uL (ref 1.4–7.0)
Neutrophils: 53 %
Platelets: 158 10*3/uL (ref 150–450)
RBC: 3.89 x10E6/uL — ABNORMAL LOW (ref 4.14–5.80)
RDW: 13.5 % (ref 11.6–15.4)
WBC: 4.1 10*3/uL (ref 3.4–10.8)

## 2019-09-15 LAB — COMPREHENSIVE METABOLIC PANEL
ALT: 18 IU/L (ref 0–44)
AST: 22 IU/L (ref 0–40)
Albumin/Globulin Ratio: 0.6 — ABNORMAL LOW (ref 1.2–2.2)
Albumin: 2.9 g/dL — ABNORMAL LOW (ref 3.7–4.7)
Alkaline Phosphatase: 91 IU/L (ref 39–117)
BUN/Creatinine Ratio: 7 — ABNORMAL LOW (ref 10–24)
BUN: 10 mg/dL (ref 8–27)
Bilirubin Total: 0.4 mg/dL (ref 0.0–1.2)
CO2: 20 mmol/L (ref 20–29)
Calcium: 8.7 mg/dL (ref 8.6–10.2)
Chloride: 103 mmol/L (ref 96–106)
Creatinine, Ser: 1.51 mg/dL — ABNORMAL HIGH (ref 0.76–1.27)
GFR calc Af Amer: 52 mL/min/{1.73_m2} — ABNORMAL LOW (ref >59)
GFR calc non Af Amer: 45 mL/min/{1.73_m2} — ABNORMAL LOW (ref >59)
Globulin, Total: 5 g/dL — ABNORMAL HIGH (ref 1.5–4.5)
Glucose: 102 mg/dL — ABNORMAL HIGH (ref 65–99)
Potassium: 4.3 mmol/L (ref 3.5–5.2)
Sodium: 139 mmol/L (ref 134–144)
Total Protein: 7.9 g/dL (ref 6.0–8.5)

## 2019-09-15 LAB — SPECIMEN STATUS REPORT

## 2019-10-01 ENCOUNTER — Other Ambulatory Visit: Payer: Self-pay | Admitting: Family Medicine

## 2019-10-30 ENCOUNTER — Other Ambulatory Visit (INDEPENDENT_AMBULATORY_CARE_PROVIDER_SITE_OTHER): Payer: Self-pay | Admitting: Internal Medicine

## 2019-10-30 ENCOUNTER — Other Ambulatory Visit: Payer: Self-pay | Admitting: Family Medicine

## 2019-11-12 ENCOUNTER — Encounter (INDEPENDENT_AMBULATORY_CARE_PROVIDER_SITE_OTHER): Payer: Self-pay | Admitting: *Deleted

## 2019-11-30 ENCOUNTER — Other Ambulatory Visit (INDEPENDENT_AMBULATORY_CARE_PROVIDER_SITE_OTHER): Payer: Self-pay | Admitting: Internal Medicine

## 2019-11-30 ENCOUNTER — Other Ambulatory Visit: Payer: Self-pay | Admitting: Family Medicine

## 2019-12-30 ENCOUNTER — Other Ambulatory Visit: Payer: Self-pay | Admitting: Family Medicine

## 2019-12-31 DIAGNOSIS — I10 Essential (primary) hypertension: Secondary | ICD-10-CM | POA: Diagnosis not present

## 2019-12-31 DIAGNOSIS — Z0189 Encounter for other specified special examinations: Secondary | ICD-10-CM | POA: Diagnosis not present

## 2019-12-31 DIAGNOSIS — E782 Mixed hyperlipidemia: Secondary | ICD-10-CM | POA: Diagnosis not present

## 2019-12-31 DIAGNOSIS — M1A00X0 Idiopathic chronic gout, unspecified site, without tophus (tophi): Secondary | ICD-10-CM | POA: Diagnosis not present

## 2019-12-31 DIAGNOSIS — K2100 Gastro-esophageal reflux disease with esophagitis, without bleeding: Secondary | ICD-10-CM | POA: Diagnosis not present

## 2020-01-07 ENCOUNTER — Encounter (HOSPITAL_COMMUNITY): Payer: Self-pay | Admitting: Emergency Medicine

## 2020-01-07 ENCOUNTER — Inpatient Hospital Stay (HOSPITAL_COMMUNITY): Payer: Medicare Other

## 2020-01-07 ENCOUNTER — Inpatient Hospital Stay (HOSPITAL_COMMUNITY)
Admission: EM | Admit: 2020-01-07 | Discharge: 2020-01-09 | DRG: 291 | Disposition: A | Discharge status: Home Health | Payer: Medicare Other | Attending: Family Medicine | Admitting: Family Medicine

## 2020-01-07 ENCOUNTER — Other Ambulatory Visit: Payer: Self-pay

## 2020-01-07 ENCOUNTER — Emergency Department (HOSPITAL_COMMUNITY): Payer: Medicare Other

## 2020-01-07 DIAGNOSIS — Z66 Do not resuscitate: Secondary | ICD-10-CM | POA: Diagnosis not present

## 2020-01-07 DIAGNOSIS — R069 Unspecified abnormalities of breathing: Secondary | ICD-10-CM | POA: Diagnosis not present

## 2020-01-07 DIAGNOSIS — E785 Hyperlipidemia, unspecified: Secondary | ICD-10-CM | POA: Diagnosis not present

## 2020-01-07 DIAGNOSIS — R0600 Dyspnea, unspecified: Secondary | ICD-10-CM

## 2020-01-07 DIAGNOSIS — J811 Chronic pulmonary edema: Secondary | ICD-10-CM | POA: Diagnosis not present

## 2020-01-07 DIAGNOSIS — R739 Hyperglycemia, unspecified: Secondary | ICD-10-CM | POA: Diagnosis present

## 2020-01-07 DIAGNOSIS — R0902 Hypoxemia: Secondary | ICD-10-CM

## 2020-01-07 DIAGNOSIS — Z681 Body mass index (BMI) 19 or less, adult: Secondary | ICD-10-CM | POA: Diagnosis not present

## 2020-01-07 DIAGNOSIS — I340 Nonrheumatic mitral (valve) insufficiency: Secondary | ICD-10-CM | POA: Diagnosis not present

## 2020-01-07 DIAGNOSIS — Z79899 Other long term (current) drug therapy: Secondary | ICD-10-CM | POA: Diagnosis not present

## 2020-01-07 DIAGNOSIS — I255 Ischemic cardiomyopathy: Secondary | ICD-10-CM | POA: Diagnosis present

## 2020-01-07 DIAGNOSIS — M109 Gout, unspecified: Secondary | ICD-10-CM | POA: Diagnosis not present

## 2020-01-07 DIAGNOSIS — J441 Chronic obstructive pulmonary disease with (acute) exacerbation: Secondary | ICD-10-CM | POA: Diagnosis present

## 2020-01-07 DIAGNOSIS — R0689 Other abnormalities of breathing: Secondary | ICD-10-CM | POA: Diagnosis not present

## 2020-01-07 DIAGNOSIS — I252 Old myocardial infarction: Secondary | ICD-10-CM

## 2020-01-07 DIAGNOSIS — K219 Gastro-esophageal reflux disease without esophagitis: Secondary | ICD-10-CM | POA: Diagnosis not present

## 2020-01-07 DIAGNOSIS — R0602 Shortness of breath: Secondary | ICD-10-CM | POA: Diagnosis not present

## 2020-01-07 DIAGNOSIS — N184 Chronic kidney disease, stage 4 (severe): Secondary | ICD-10-CM | POA: Diagnosis present

## 2020-01-07 DIAGNOSIS — I509 Heart failure, unspecified: Secondary | ICD-10-CM | POA: Diagnosis not present

## 2020-01-07 DIAGNOSIS — Z515 Encounter for palliative care: Secondary | ICD-10-CM | POA: Diagnosis not present

## 2020-01-07 DIAGNOSIS — I5043 Acute on chronic combined systolic (congestive) and diastolic (congestive) heart failure: Secondary | ICD-10-CM | POA: Diagnosis not present

## 2020-01-07 DIAGNOSIS — K08109 Complete loss of teeth, unspecified cause, unspecified class: Secondary | ICD-10-CM | POA: Diagnosis not present

## 2020-01-07 DIAGNOSIS — J90 Pleural effusion, not elsewhere classified: Secondary | ICD-10-CM | POA: Diagnosis not present

## 2020-01-07 DIAGNOSIS — Z720 Tobacco use: Secondary | ICD-10-CM | POA: Diagnosis present

## 2020-01-07 DIAGNOSIS — N289 Disorder of kidney and ureter, unspecified: Secondary | ICD-10-CM

## 2020-01-07 DIAGNOSIS — I2510 Atherosclerotic heart disease of native coronary artery without angina pectoris: Secondary | ICD-10-CM | POA: Diagnosis not present

## 2020-01-07 DIAGNOSIS — Z7189 Other specified counseling: Secondary | ICD-10-CM | POA: Diagnosis not present

## 2020-01-07 DIAGNOSIS — F1020 Alcohol dependence, uncomplicated: Secondary | ICD-10-CM | POA: Diagnosis present

## 2020-01-07 DIAGNOSIS — E46 Unspecified protein-calorie malnutrition: Secondary | ICD-10-CM | POA: Diagnosis present

## 2020-01-07 DIAGNOSIS — I361 Nonrheumatic tricuspid (valve) insufficiency: Secondary | ICD-10-CM | POA: Diagnosis not present

## 2020-01-07 DIAGNOSIS — Z20822 Contact with and (suspected) exposure to covid-19: Secondary | ICD-10-CM | POA: Diagnosis not present

## 2020-01-07 DIAGNOSIS — Z8711 Personal history of peptic ulcer disease: Secondary | ICD-10-CM | POA: Diagnosis not present

## 2020-01-07 DIAGNOSIS — J9601 Acute respiratory failure with hypoxia: Secondary | ICD-10-CM | POA: Diagnosis present

## 2020-01-07 DIAGNOSIS — F17210 Nicotine dependence, cigarettes, uncomplicated: Secondary | ICD-10-CM | POA: Diagnosis present

## 2020-01-07 DIAGNOSIS — K7460 Unspecified cirrhosis of liver: Secondary | ICD-10-CM | POA: Diagnosis present

## 2020-01-07 DIAGNOSIS — J189 Pneumonia, unspecified organism: Secondary | ICD-10-CM | POA: Diagnosis present

## 2020-01-07 DIAGNOSIS — I10 Essential (primary) hypertension: Secondary | ICD-10-CM | POA: Diagnosis not present

## 2020-01-07 DIAGNOSIS — D649 Anemia, unspecified: Secondary | ICD-10-CM | POA: Diagnosis present

## 2020-01-07 DIAGNOSIS — N189 Chronic kidney disease, unspecified: Secondary | ICD-10-CM | POA: Diagnosis present

## 2020-01-07 DIAGNOSIS — I5023 Acute on chronic systolic (congestive) heart failure: Secondary | ICD-10-CM | POA: Diagnosis present

## 2020-01-07 DIAGNOSIS — J440 Chronic obstructive pulmonary disease with acute lower respiratory infection: Secondary | ICD-10-CM | POA: Diagnosis present

## 2020-01-07 DIAGNOSIS — J9691 Respiratory failure, unspecified with hypoxia: Secondary | ICD-10-CM | POA: Diagnosis present

## 2020-01-07 LAB — BLOOD GAS, VENOUS
Acid-base deficit: 2.9 mmol/L — ABNORMAL HIGH (ref 0.0–2.0)
Bicarbonate: 20.4 mmol/L (ref 20.0–28.0)
FIO2: 100
O2 Saturation: 37 %
Patient temperature: 35.4
pCO2, Ven: 59.1 mmHg (ref 44.0–60.0)
pH, Ven: 7.226 — ABNORMAL LOW (ref 7.250–7.430)
pO2, Ven: <31 mmHg — CL (ref 32.0–45.0)

## 2020-01-07 LAB — COMPREHENSIVE METABOLIC PANEL
ALT: 19 U/L (ref 0–44)
AST: 32 U/L (ref 15–41)
Albumin: 3.1 g/dL — ABNORMAL LOW (ref 3.5–5.0)
Alkaline Phosphatase: 67 U/L (ref 38–126)
Anion gap: 14 (ref 5–15)
BUN: 16 mg/dL (ref 8–23)
CO2: 20 mmol/L — ABNORMAL LOW (ref 22–32)
Calcium: 8.6 mg/dL — ABNORMAL LOW (ref 8.9–10.3)
Chloride: 104 mmol/L (ref 98–111)
Creatinine, Ser: 1.82 mg/dL — ABNORMAL HIGH (ref 0.61–1.24)
GFR calc Af Amer: 41 mL/min — ABNORMAL LOW (ref >60)
GFR calc non Af Amer: 36 mL/min — ABNORMAL LOW (ref >60)
Glucose, Bld: 234 mg/dL — ABNORMAL HIGH (ref 70–99)
Potassium: 4.5 mmol/L (ref 3.5–5.1)
Sodium: 138 mmol/L (ref 135–145)
Total Bilirubin: 0.5 mg/dL (ref 0.3–1.2)
Total Protein: 7.9 g/dL (ref 6.5–8.1)

## 2020-01-07 LAB — RESPIRATORY PANEL BY RT PCR (FLU A&B, COVID)
Influenza A by PCR: NEGATIVE
Influenza B by PCR: NEGATIVE
SARS Coronavirus 2 by RT PCR: NEGATIVE

## 2020-01-07 LAB — CBC WITH DIFFERENTIAL/PLATELET
Band Neutrophils: 2 %
Basophils Absolute: 0.1 10*3/uL (ref 0.0–0.1)
Basophils Relative: 1 %
Eosinophils Absolute: 0.5 10*3/uL (ref 0.0–0.5)
Eosinophils Relative: 6 %
HCT: 36.3 % — ABNORMAL LOW (ref 39.0–52.0)
Hemoglobin: 11.1 g/dL — ABNORMAL LOW (ref 13.0–17.0)
Lymphocytes Relative: 67 %
Lymphs Abs: 5.2 10*3/uL — ABNORMAL HIGH (ref 0.7–4.0)
MCH: 29.3 pg (ref 26.0–34.0)
MCHC: 30.6 g/dL (ref 30.0–36.0)
MCV: 95.8 fL (ref 80.0–100.0)
Monocytes Absolute: 0.2 10*3/uL (ref 0.1–1.0)
Monocytes Relative: 3 %
Neutro Abs: 1.8 10*3/uL (ref 1.7–7.7)
Neutrophils Relative %: 21 %
Platelets: 207 10*3/uL (ref 150–400)
RBC: 3.79 MIL/uL — ABNORMAL LOW (ref 4.22–5.81)
RDW: 15.6 % — ABNORMAL HIGH (ref 11.5–15.5)
WBC Morphology: ABNORMAL
WBC: 7.7 10*3/uL (ref 4.0–10.5)
nRBC: 0 % (ref 0.0–0.2)

## 2020-01-07 LAB — ECHOCARDIOGRAM COMPLETE
Height: 66 in
Weight: 1920 oz

## 2020-01-07 LAB — TROPONIN I (HIGH SENSITIVITY)
Troponin I (High Sensitivity): 36 ng/L — ABNORMAL HIGH (ref <18)
Troponin I (High Sensitivity): 47 ng/L — ABNORMAL HIGH (ref <18)

## 2020-01-07 LAB — ETHANOL: Alcohol, Ethyl (B): <10 mg/dL (ref <10)

## 2020-01-07 LAB — BRAIN NATRIURETIC PEPTIDE: B Natriuretic Peptide: 1603 pg/mL — ABNORMAL HIGH (ref 0.0–100.0)

## 2020-01-07 MED ORDER — SODIUM CHLORIDE 0.9% FLUSH
3.0000 mL | Freq: Two times a day (BID) | INTRAVENOUS | Status: DC
  Administered 2020-01-07 – 2020-01-09 (×3): 3 mL via INTRAVENOUS

## 2020-01-07 MED ORDER — CARVEDILOL 12.5 MG PO TABS
12.5000 mg | ORAL_TABLET | Freq: Two times a day (BID) | ORAL | Status: DC
  Administered 2020-01-07 (×2): 12.5 mg via ORAL
  Filled 2020-01-07 (×2): qty 1

## 2020-01-07 MED ORDER — METHYLPREDNISOLONE SODIUM SUCC 40 MG IJ SOLR
40.0000 mg | Freq: Three times a day (TID) | INTRAMUSCULAR | Status: DC
  Administered 2020-01-07 – 2020-01-08 (×3): 40 mg via INTRAVENOUS
  Filled 2020-01-07 (×3): qty 1

## 2020-01-07 MED ORDER — SODIUM CHLORIDE 0.9 % IV SOLN
500.0000 mg | Freq: Once | INTRAVENOUS | Status: AC
  Administered 2020-01-07: 500 mg via INTRAVENOUS
  Filled 2020-01-07: qty 500

## 2020-01-07 MED ORDER — PANTOPRAZOLE SODIUM 40 MG PO TBEC
40.0000 mg | DELAYED_RELEASE_TABLET | Freq: Every day | ORAL | Status: DC
  Administered 2020-01-07 – 2020-01-09 (×3): 40 mg via ORAL
  Filled 2020-01-07 (×3): qty 1

## 2020-01-07 MED ORDER — GUAIFENESIN ER 600 MG PO TB12
600.0000 mg | ORAL_TABLET | Freq: Two times a day (BID) | ORAL | Status: DC
  Administered 2020-01-07 – 2020-01-09 (×4): 600 mg via ORAL
  Filled 2020-01-07 (×5): qty 1

## 2020-01-07 MED ORDER — COLCHICINE 0.6 MG PO TABS: 0.6000 mg | ORAL_TABLET | Freq: Two times a day (BID) | ORAL | Status: DC

## 2020-01-07 MED ORDER — SODIUM CHLORIDE 0.9 % IV SOLN
1.0000 g | INTRAVENOUS | Status: DC
  Administered 2020-01-07: 1 g via INTRAVENOUS
  Filled 2020-01-07 (×2): qty 10

## 2020-01-07 MED ORDER — IPRATROPIUM-ALBUTEROL 0.5-2.5 (3) MG/3ML IN SOLN
3.0000 mL | Freq: Four times a day (QID) | RESPIRATORY_TRACT | Status: DC
  Administered 2020-01-07 – 2020-01-08 (×7): 3 mL via RESPIRATORY_TRACT
  Filled 2020-01-07 (×7): qty 3

## 2020-01-07 MED ORDER — POLYETHYLENE GLYCOL 3350 17 G PO PACK: 17.0000 g | PACK | Freq: Every day | ORAL | Status: DC | PRN

## 2020-01-07 MED ORDER — TRAZODONE HCL 50 MG PO TABS: 50.0000 mg | ORAL_TABLET | Freq: Every evening | ORAL | Status: DC | PRN

## 2020-01-07 MED ORDER — HEPARIN SODIUM (PORCINE) 5000 UNIT/ML IJ SOLN
5000.0000 [IU] | Freq: Three times a day (TID) | INTRAMUSCULAR | Status: DC
  Administered 2020-01-07 – 2020-01-08 (×4): 5000 [IU] via SUBCUTANEOUS
  Filled 2020-01-07 (×6): qty 1

## 2020-01-07 MED ORDER — FOLIC ACID 1 MG PO TABS
1.0000 mg | ORAL_TABLET | Freq: Every day | ORAL | Status: DC
  Administered 2020-01-07 – 2020-01-09 (×3): 1 mg via ORAL
  Filled 2020-01-07 (×3): qty 1

## 2020-01-07 MED ORDER — VITAMIN B-12 100 MCG PO TABS
50.0000 ug | ORAL_TABLET | Freq: Every day | ORAL | Status: DC
  Administered 2020-01-07 – 2020-01-09 (×2): 50 ug via ORAL
  Filled 2020-01-07 (×7): qty 1

## 2020-01-07 MED ORDER — CARVEDILOL 3.125 MG PO TABS
3.1250 mg | ORAL_TABLET | Freq: Two times a day (BID) | ORAL | Status: DC
  Administered 2020-01-08 – 2020-01-09 (×3): 3.125 mg via ORAL
  Filled 2020-01-07 (×3): qty 1

## 2020-01-07 MED ORDER — SODIUM CHLORIDE 0.9 % IV SOLN
1.0000 g | Freq: Once | INTRAVENOUS | Status: AC
  Administered 2020-01-07: 1 g via INTRAVENOUS
  Filled 2020-01-07: qty 10

## 2020-01-07 MED ORDER — ONDANSETRON HCL 4 MG PO TABS: 4.0000 mg | ORAL_TABLET | Freq: Four times a day (QID) | ORAL | Status: DC | PRN

## 2020-01-07 MED ORDER — SODIUM CHLORIDE 0.9% FLUSH: 3.0000 mL | INTRAVENOUS | Status: DC | PRN

## 2020-01-07 MED ORDER — FUROSEMIDE 10 MG/ML IJ SOLN
60.0000 mg | Freq: Two times a day (BID) | INTRAMUSCULAR | Status: DC
  Administered 2020-01-07 – 2020-01-08 (×2): 60 mg via INTRAVENOUS
  Filled 2020-01-07 (×2): qty 6

## 2020-01-07 MED ORDER — ALLOPURINOL 100 MG PO TABS
100.0000 mg | ORAL_TABLET | Freq: Every day | ORAL | Status: DC
  Administered 2020-01-07 – 2020-01-09 (×3): 100 mg via ORAL
  Filled 2020-01-07 (×4): qty 1

## 2020-01-07 MED ORDER — MAGNESIUM OXIDE 400 (241.3 MG) MG PO TABS
400.0000 mg | ORAL_TABLET | Freq: Every day | ORAL | Status: DC
  Administered 2020-01-07 – 2020-01-09 (×3): 400 mg via ORAL
  Filled 2020-01-07 (×3): qty 1

## 2020-01-07 MED ORDER — CHLORHEXIDINE GLUCONATE CLOTH 2 % EX PADS
6.0000 | MEDICATED_PAD | Freq: Every day | CUTANEOUS | Status: DC
  Administered 2020-01-08: 6 via TOPICAL

## 2020-01-07 MED ORDER — ADULT MULTIVITAMIN W/MINERALS CH
1.0000 | ORAL_TABLET | Freq: Every day | ORAL | Status: DC
  Administered 2020-01-07 – 2020-01-09 (×3): 1 via ORAL
  Filled 2020-01-07 (×3): qty 1

## 2020-01-07 MED ORDER — NITROGLYCERIN 2 % TD OINT
1.0000 [in_us] | TOPICAL_OINTMENT | Freq: Once | TRANSDERMAL | Status: AC
  Administered 2020-01-07: 1 [in_us] via TOPICAL
  Filled 2020-01-07: qty 1

## 2020-01-07 MED ORDER — SODIUM CHLORIDE 0.9 % IV SOLN: 250.0000 mL | INTRAVENOUS | Status: DC | PRN

## 2020-01-07 MED ORDER — ALBUTEROL SULFATE (2.5 MG/3ML) 0.083% IN NEBU: 2.5000 mg | INHALATION_SOLUTION | RESPIRATORY_TRACT | Status: DC | PRN

## 2020-01-07 MED ORDER — FUROSEMIDE 10 MG/ML IJ SOLN
80.0000 mg | Freq: Once | INTRAMUSCULAR | Status: AC
  Administered 2020-01-07: 80 mg via INTRAVENOUS
  Filled 2020-01-07: qty 8

## 2020-01-07 MED ORDER — SODIUM CHLORIDE 0.9 % IV SOLN
500.0000 mg | INTRAVENOUS | Status: DC
  Administered 2020-01-07: 500 mg via INTRAVENOUS
  Filled 2020-01-07: qty 500

## 2020-01-07 MED ORDER — ATORVASTATIN CALCIUM 40 MG PO TABS
40.0000 mg | ORAL_TABLET | Freq: Every day | ORAL | Status: DC
  Administered 2020-01-07 – 2020-01-08 (×2): 40 mg via ORAL
  Filled 2020-01-07 (×2): qty 1

## 2020-01-07 MED ORDER — ORAL CARE MOUTH RINSE
15.0000 mL | Freq: Two times a day (BID) | OROMUCOSAL | Status: DC
  Administered 2020-01-07 – 2020-01-09 (×4): 15 mL via OROMUCOSAL

## 2020-01-07 MED ORDER — METHYLPREDNISOLONE SODIUM SUCC 125 MG IJ SOLR
125.0000 mg | Freq: Once | INTRAMUSCULAR | Status: AC
  Administered 2020-01-07: 125 mg via INTRAVENOUS
  Filled 2020-01-07: qty 2

## 2020-01-07 MED ORDER — LORAZEPAM 2 MG/ML IJ SOLN
0.5000 mg | Freq: Once | INTRAMUSCULAR | Status: AC
  Administered 2020-01-07: 0.5 mg via INTRAVENOUS
  Filled 2020-01-07: qty 1

## 2020-01-07 MED ORDER — THIAMINE HCL 100 MG PO TABS
100.0000 mg | ORAL_TABLET | Freq: Every day | ORAL | Status: DC
  Administered 2020-01-07 – 2020-01-09 (×3): 100 mg via ORAL
  Filled 2020-01-07 (×3): qty 1

## 2020-01-07 MED ORDER — ONDANSETRON HCL 4 MG/2ML IJ SOLN: 4.0000 mg | Freq: Four times a day (QID) | INTRAMUSCULAR | Status: DC | PRN

## 2020-01-07 NOTE — ED Notes (Signed)
ED TO INPATIENT HANDOFF REPORT  ED Nurse Name and Phone #:   S Name/Age/Gender [name] 74 y.o. male Room/Bed: APA01/APA01  Code Status   Code Status: Full Code  Home/SNF/Other Home Patient oriented to: self, place, time and situation Is this baseline? Yes   Triage Complete: Triage complete  Chief Complaint Respiratory failure with hypoxia ([place]) [J96.91]  Triage Note Pt C/O sudden onset of SOB upon waking. Initial O2 70% on RA.     Allergies No Known Allergies  Level of Care/Admitting Diagnosis ED Disposition    ED Disposition Condition [facility] [[id number]]  Level of Care: Stepdown [14]  Covid Evaluation: Asymptomatic Screening Protocol (No Symptoms)  Diagnosis: Respiratory failure with hypoxia [facility]) [[id number]]  Admitting Physician: Morrison Old  Attending Physician: Morrison Old  Estimated length of stay: 3 - 4 days  Certification:: I certify this patient will need inpatient services for at least 2 midnights  Bed request comments: stepdown       B Medical/Surgery History Past Medical History:  Diagnosis Date  . Arteriosclerotic cardiovascular disease (ASCVD)    IMI in 5/07 with CTO mid cx, 60% LAD, 99% RCA-> BMS; mod. impaired LV function; EF:35-40% 2/10  . Chronic kidney disease    STAGE 3-4-creatinine 2.5 in 2009; 1.7 in 10/2008  . Cirrhosis ([place])    History of excessive alcohol use; continuing social use  . Gastroesophageal reflux disease   . Hyperlipidemia   . Tobacco abuse    60 pack years; one pack per day   Past Surgical History:  Procedure Laterality Date  . BIOPSY  12/05/2018   Procedure: BIOPSY;  Surgeon: Rogene Houston, MD;  [facility];  Service: Endoscopy;;  gastric  . ESOPHAGOGASTRODUODENOSCOPY N/A 12/19/2016   Procedure: ESOPHAGOGASTRODUODENOSCOPY (EGD);  Surgeon: Rogene Houston, MD;  [facility];  Service: Endoscopy;  Laterality: N/A;  .  ESOPHAGOGASTRODUODENOSCOPY N/A 12/21/2016   Procedure: ESOPHAGOGASTRODUODENOSCOPY (EGD);  Surgeon: Danie Binder, MD;  [facility];  Service: Endoscopy;  Laterality: N/A;  . ESOPHAGOGASTRODUODENOSCOPY (EGD) WITH PROPOFOL N/A 12/22/2016   Procedure: ESOPHAGOGASTRODUODENOSCOPY (EGD) WITH PROPOFOL;  Surgeon: Mauri Pole, MD;  Location: [facility];  Service: Endoscopy;  Laterality: N/A;  . ESOPHAGOGASTRODUODENOSCOPY (EGD) WITH PROPOFOL N/A 12/05/2018   Procedure: ESOPHAGOGASTRODUODENOSCOPY (EGD) WITH PROPOFOL;  Surgeon: Rogene Houston, MD;  [facility];  Service: Endoscopy;  Laterality: N/A;  1:35     A IV Location/Drains/Wounds Patient Lines/Drains/Airways Status   Active Line/Drains/Airways    Name:   Placement date:   Placement time:   Site:   Days:   Peripheral IV 01/07/20 Right Antecubital   01/07/20    0603    Antecubital   less than 1   External Urinary Catheter   01/07/20    2536    -   less than 1          Intake/Output Last 24 hours  Intake/Output Summary (Last 24 hours) at 01/07/2020 1500 Last data filed at 01/07/2020 1400 Gross per 24 hour  Intake -  Output 1200 ml  Net -1200 ml    Labs/Imaging Results for orders placed or performed during the hospital encounter of 01/07/20 (from the past 48 hour(s))  Respiratory Panel by RT PCR (Flu A&B, Covid) - Nasopharyngeal Swab     Status: None   Collection Time: 01/07/20  5:59 AM   Specimen: Nasopharyngeal Swab  Result Value Ref  Range   SARS Coronavirus 2 by RT PCR NEGATIVE NEGATIVE    Comment: (NOTE) SARS-CoV-2 target nucleic acids are NOT DETECTED. The SARS-CoV-2 RNA is generally detectable in upper respiratoy specimens during the acute phase of infection. The lowest concentration of SARS-CoV-2 viral copies this assay can detect is 131 copies/mL. A negative result does not preclude SARS-Cov-2 infection and should not be used as the sole basis for treatment or other patient management decisions.  A negative result may occur with  improper specimen collection/handling, submission of specimen other than nasopharyngeal swab, presence of viral mutation(s) within the areas targeted by this assay, and inadequate number of viral copies (<131 copies/mL). A negative result must be combined with clinical observations, patient history, and epidemiological information. The expected result is Negative. Fact Sheet for Patients:  PinkCheek.be Fact Sheet for Healthcare Providers:  GravelBags.it This test is not yet ap proved or cleared by the [place] FDA and  has been authorized for detection and/or diagnosis of SARS-CoV-2 by FDA under an Emergency Use Authorization (EUA). This EUA will remain  in effect (meaning this test can be used) for the duration of the COVID-19 declaration under Section 564(b)(1) of the Act, 21 U.S.C. section 360bbb-3(b)(1), unless the authorization is terminated or revoked sooner.    Influenza A by PCR NEGATIVE NEGATIVE   Influenza B by PCR NEGATIVE NEGATIVE    Comment: (NOTE) The Xpert Xpress SARS-CoV-2/FLU/RSV assay is intended as an aid in  the diagnosis of influenza from Nasopharyngeal swab specimens and  should not be used as a sole basis for treatment. Nasal washings and  aspirates are unacceptable for Xpert Xpress SARS-CoV-2/FLU/RSV  testing. Fact Sheet for Patients: PinkCheek.be Fact Sheet for Healthcare Providers: GravelBags.it This test is not yet approved or cleared by the [place] FDA and  has been authorized for detection and/or diagnosis of SARS-CoV-2 by  FDA under an Emergency Use Authorization (EUA). This EUA will remain  in effect (meaning this test can be used) for the duration of the  Covid-19 declaration under Section 564(b)(1) of the Act, 21  U.S.C. section 360bbb-3(b)(1), unless the authorization is  terminated or  revoked. Performed at [facility], [address], [place], [place] [address]   Comprehensive metabolic panel     Status: Abnormal   Collection Time: 01/07/20  6:02 AM  Result Value Ref Range   Sodium 138 135 - 145 mmol/L   Potassium 4.5 3.5 - 5.1 mmol/L   Chloride 104 98 - 111 mmol/L   CO2 20 (L) 22 - 32 mmol/L   Glucose, Bld 234 (H) 70 - 99 mg/dL    Comment: Glucose reference range applies only to samples taken after fasting for at least 8 hours.   BUN 16 8 - 23 mg/dL   Creatinine, Ser 1.82 (H) 0.61 - 1.24 mg/dL   Calcium 8.6 (L) 8.9 - 10.3 mg/dL   Total Protein 7.9 6.5 - 8.1 g/dL   Albumin 3.1 (L) 3.5 - 5.0 g/dL   AST 32 15 - 41 U/L   ALT 19 0 - 44 U/L   Alkaline Phosphatase 67 38 - 126 U/L   Total Bilirubin 0.5 0.3 - 1.2 mg/dL   GFR calc non Af Amer 36 (L) >60 mL/min   GFR calc Af Amer 41 (L) >60 mL/min   Anion gap 14 5 - 15    Comment: Performed at [facility], [address] [place], [place] [address]  Brain natriuretic peptide     Status: Abnormal  Collection Time: 01/07/20  6:02 AM  Result Value Ref Range   B Natriuretic Peptide 1,603.0 (H) 0.0 - 100.0 pg/mL    Comment: Performed at [facility], [address] [place], [place] [address]  Troponin I (High Sensitivity)     Status: Abnormal   Collection Time: 01/07/20  6:02 AM  Result Value Ref Range   Troponin I (High Sensitivity) 36 (H) <18 ng/L    Comment: (NOTE) Elevated high sensitivity troponin I (hsTnI) values and significant  changes across serial measurements may suggest ACS but many other  chronic and acute conditions are known to elevate hsTnI results.  Refer to the "Links" section for chest pain algorithms and additional  guidance. Performed at [facility], [address] [place], [place] [address]   CBC with Differential     Status: Abnormal   Collection Time: 01/07/20  6:02 AM  Result Value Ref Range   WBC 7.7 4.0 - 10.5 K/uL   RBC 3.79 (L) 4.22 - 5.81 MIL/uL   Hemoglobin 11.1 (L) 13.0 - 17.0 g/dL    HCT 36.3 (L) 39.0 - 52.0 %   MCV 95.8 80.0 - 100.0 fL   MCH 29.3 26.0 - 34.0 pg   MCHC 30.6 30.0 - 36.0 g/dL   RDW 15.6 (H) 11.5 - 15.5 %   Platelets 207 150 - 400 K/uL   nRBC 0.0 0.0 - 0.2 %   Neutrophils Relative % 21 %   Neutro Abs 1.8 1.7 - 7.7 K/uL   Band Neutrophils 2 %   Lymphocytes Relative 67 %   Lymphs Abs 5.2 (H) 0.7 - 4.0 K/uL   Monocytes Relative 3 %   Monocytes Absolute 0.2 0.1 - 1.0 K/uL   Eosinophils Relative 6 %   Eosinophils Absolute 0.5 0.0 - 0.5 K/uL   Basophils Relative 1 %   Basophils Absolute 0.1 0.0 - 0.1 K/uL   WBC Morphology Abnormal lymphocytes present     Comment: SMUDGE CELLS   Smudge Cells PRESENT     Comment: Performed at [facility], [address], [place], [place] [address]  Ethanol     Status: None   Collection Time: 01/07/20  6:02 AM  Result Value Ref Range   Alcohol, Ethyl (B) <10 <10 mg/dL    Comment: (NOTE) Lowest detectable limit for serum alcohol is 10 mg/dL. For medical purposes only. Performed at [facility], [address], [place], [place] [address]   Culture, blood (routine x 2)     Status: None (Preliminary result)   Collection Time: 01/07/20  7:27 AM   Specimen: BLOOD RIGHT ARM  Result Value Ref Range   Specimen Description BLOOD RIGHT ARM    Special Requests      BOTTLES DRAWN AEROBIC AND ANAEROBIC Blood Culture adequate volume Performed at [facility], [address], [place], [place] [address]    Culture PENDING    Report Status PENDING   Blood gas, venous     Status: Abnormal   Collection Time: 01/07/20  7:27 AM  Result Value Ref Range   FIO2 100.00    pH, Ven 7.226 (L) 7.250 - 7.430   pCO2, Ven 59.1 44.0 - 60.0 mmHg   pO2, Ven <31.0 (LL) 32.0 - 45.0 mmHg    Comment: CRITICAL RESULT CALLED TO, READ BACK BY AND VERIFIED WITH: ELLIS,C AT 8:10AM ON 01/07/20 BY FESTERMAN,C    Bicarbonate 20.4 20.0 - 28.0 mmol/L   Acid-base deficit 2.9 (H) 0.0 - 2.0 mmol/L   O2 Saturation  37.0 %   Patient temperature 35.4      Comment: Performed at [facility], [address], [place], [place] [address]  Culture, blood (routine x 2)     Status: None (Preliminary result)   Collection Time: 01/07/20  7:38 AM   Specimen: BLOOD LEFT ARM  Result Value Ref Range   Specimen Description BLOOD LEFT ARM    Special Requests      BOTTLES DRAWN AEROBIC AND ANAEROBIC Blood Culture adequate volume Performed at [facility], [address] [place], [place] [address]    Culture PENDING    Report Status PENDING   Troponin I (High Sensitivity)     Status: Abnormal   Collection Time: 01/07/20  7:44 AM  Result Value Ref Range   Troponin I (High Sensitivity) 47 (H) <18 ng/L    Comment: (NOTE) Elevated high sensitivity troponin I (hsTnI) values and significant  changes across serial measurements may suggest ACS but many other  chronic and acute conditions are known to elevate hsTnI results.  Refer to the "Links" section for chest pain algorithms and additional  guidance. Performed at [facility], [address], [place], [place] [address]    DG Chest Port 1 View  Result Date: 01/07/2020 CLINICAL DATA:  Acute onset shortness of breath.  Hypoxia. EXAM: PORTABLE CHEST 1 VIEW COMPARISON:  One-view chest x-ray 12/06/2018 FINDINGS: Heart size is exaggerated by low lung volumes. Atherosclerotic calcifications are present at the arch. Mild diffuse edema is present. Right greater than left lower lobe airspace disease is present. Right effusion is noted. IMPRESSION: 1. Dense right lower lobe airspace disease compatible with pneumonia. 2. Slightly less prominent left lower lobe airspace disease is also concerning for infection. 3. Mild diffuse edema. 4. Right pleural effusion. 5. Atherosclerosis. Electronically Signed   By: San Morelle M.D.   On: 01/07/2020 06:28   ECHOCARDIOGRAM COMPLETE  Result Date: 01/07/2020    ECHOCARDIOGRAM REPORT   Patient Name:   [name] Date of Exam: 01/07/2020 Medical Rec #:  [id number]           Height:       66.0 in Accession #:    [id number]         Weight:       120.0 lb Date of Birth:  05/18/1946          BSA:          1.610 m Patient Age:    7 years           BP:           150/74 mmHg Patient Gender: M                  HR:           86 bpm. Exam Location:  [facility] Procedure: 2D Echo, Cardiac Doppler and Color Doppler Indications:    Dyspnea 786.09 / R06.00  History:        Patient has prior history of Echocardiogram examinations, most                 recent 12/19/2016. CHF, CAD; Risk Factors:Dyslipidemia. Alcohol                 dependence,Acute renal failure (ARF),Cirrhosis,Chronic systolic                 congestive heart failure,Tobacco abuse.  Sonographer:    Alvino Chapel RCS Referring Phys: [phone] COURAGE EMOKPAE IMPRESSIONS  1. Left ventricular  ejection fraction, by estimation, is 10-15%. The left ventricle has severely decreased function. The left ventricle demonstrates global hypokinesis. The left ventricular internal cavity size was mildly dilated. Left ventricular diastolic parameters are consistent with Grade I diastolic dysfunction (impaired relaxation). Elevated left ventricular end-diastolic pressure.  2. Right ventricular systolic function is mildly reduced. The right ventricular size is normal. There is mildly elevated pulmonary artery systolic pressure.  3. Left atrial size was severely dilated.  4. The mitral valve is grossly normal. Mild mitral valve regurgitation.  5. The aortic valve is tricuspid. Aortic valve regurgitation is trivial. No aortic stenosis is present.  6. The inferior vena cava is dilated in size with <50% respiratory variability, suggesting right atrial pressure of 15 mmHg. FINDINGS  Left Ventricle: Left ventricular ejection fraction, by estimation, is 10-15%. The left ventricle has severely decreased function. The left ventricle demonstrates global hypokinesis. The left ventricular internal cavity size was mildly dilated. There is no left ventricular  hypertrophy. Left ventricular diastolic parameters are consistent with Grade I diastolic dysfunction (impaired relaxation). Elevated left ventricular end-diastolic pressure.  LV Wall Scoring: The anterior septum is akinetic. Right Ventricle: The right ventricular size is normal. No increase in right ventricular wall thickness. Right ventricular systolic function is mildly reduced. There is mildly elevated pulmonary artery systolic pressure. The tricuspid regurgitant velocity  is 2.45 m/s, and with an assumed right atrial pressure of 15 mmHg, the estimated right ventricular systolic pressure is 16.0 mmHg. Left Atrium: Left atrial size was severely dilated. Right Atrium: Right atrial size was normal in size. Pericardium: There is no evidence of pericardial effusion. Mitral Valve: The mitral valve is grossly normal. Mild mitral valve regurgitation. Tricuspid Valve: The tricuspid valve is grossly normal. Tricuspid valve regurgitation is mild. Aortic Valve: The aortic valve is tricuspid. Aortic valve regurgitation is trivial. No aortic stenosis is present. Pulmonic Valve: The pulmonic valve was not well visualized. Pulmonic valve regurgitation is not visualized. Aorta: The aortic root is normal in size and structure. Venous: The inferior vena cava is dilated in size with less than 50% respiratory variability, suggesting right atrial pressure of 15 mmHg. IAS/Shunts: No atrial level shunt detected by color flow Doppler.  LEFT VENTRICLE PLAX 2D LVIDd:         5.39 cm      Diastology LVIDs:         5.24 cm      LV e' lateral:   7.07 cm/s LV PW:         0.92 cm      LV E/e' lateral: 12.6 LV IVS:        0.88 cm      LV e' medial:    3.92 cm/s LVOT diam:     2.00 cm      LV E/e' medial:  22.7 LV SV:         39 LV SV Index:   24 LVOT Area:     3.14 cm  LV Volumes (MOD) LV vol d, MOD A2C: 130.0 ml LV vol d, MOD A4C: 136.0 ml LV vol s, MOD A2C: 118.0 ml LV vol s, MOD A4C: 106.0 ml LV SV MOD A2C:     12.0 ml LV SV MOD A4C:      136.0 ml LV SV MOD BP:      18.1 ml RIGHT VENTRICLE RV S prime:     15.10 cm/s TAPSE (M-mode): 1.4 cm LEFT ATRIUM  Index LA diam:        3.20 cm 1.99 cm/m LA Vol (A2C):   65.0 ml 40.38 ml/m LA Vol (A4C):   66.5 ml 41.32 ml/m LA Biplane Vol: 67.4 ml 41.88 ml/m  AORTIC VALVE LVOT Vmax:   64.30 cm/s LVOT Vmean:  39.400 cm/s LVOT VTI:    0.124 m MITRAL VALVE               TRICUSPID VALVE MV Area (PHT): 5.32 cm    TR Peak grad:   24.0 mmHg MV Decel Time: 143 msec    TR Vmax:        245.00 cm/s MV E velocity: 89.10 cm/s MV A velocity: 89.80 cm/s  SHUNTS MV E/A ratio:  0.99        Systemic VTI:  0.12 m                            Systemic Diam: 2.00 cm Kate Sable MD Electronically signed by Kate Sable MD Signature Date/Time: 01/07/2020/2:47:16 PM    Final     Pending Labs Unresulted Labs (From admission, onward)    Start     Ordered   01/08/20 6237  Basic metabolic panel  Tomorrow morning,   R     01/07/20 0814   01/08/20 0500  CBC  Tomorrow morning,   R     01/07/20 0814   01/07/20 0602  Pathologist smear review  Once,   STAT     01/07/20 0602          Vitals/Pain Today's Vitals   01/07/20 1351 01/07/20 1403 01/07/20 1430 01/07/20 1437  BP:  137/75 134/80   Pulse:  77 78   Resp:  (!) 25 (!) 29   Temp:      TempSrc:      SpO2: 99% 97% 95%   Weight:      Height:      PainSc:    0-No pain    Isolation Precautions No active isolations  Medications Medications  ipratropium-albuterol (DUONEB) 0.5-2.5 (3) MG/3ML nebulizer solution 3 mL (3 mLs Nebulization Given 01/07/20 1333)  magnesium oxide (MAG-OX) tablet 400 mg (400 mg Oral Given 01/07/20 1104)  thiamine tablet 100 mg (100 mg Oral Given 01/07/20 1155)  multivitamin with minerals tablet 1 tablet (1 tablet Oral Given 01/07/20 1155)  vitamin B-12 (CYANOCOBALAMIN) tablet 50 mcg (has no administration in time range)  allopurinol (ZYLOPRIM) tablet 100 mg (100 mg Oral Given 01/07/20 1440)  pantoprazole (PROTONIX) EC  tablet 40 mg (40 mg Oral Given 01/07/20 1155)  atorvastatin (LIPITOR) tablet 40 mg (has no administration in time range)  carvedilol (COREG) tablet 12.5 mg (12.5 mg Oral Given 03/14/30 5176)  folic acid (FOLVITE) tablet 1 mg (1 mg Oral Given 01/07/20 1104)  sodium chloride flush (NS) 0.9 % injection 3 mL (3 mLs Intravenous Not Given 01/07/20 1207)  sodium chloride flush (NS) 0.9 % injection 3 mL (has no administration in time range)  0.9 %  sodium chloride infusion (has no administration in time range)  traZODone (DESYREL) tablet 50 mg (has no administration in time range)  polyethylene glycol (MIRALAX / GLYCOLAX) packet 17 g (has no administration in time range)  ondansetron (ZOFRAN) tablet 4 mg (has no administration in time range)    Or  ondansetron (ZOFRAN) injection 4 mg (has no administration in time range)  albuterol (PROVENTIL) (2.5 MG/3ML) 0.083% nebulizer solution 2.5 mg (has no administration in  time range)  heparin injection 5,000 Units (5,000 Units Subcutaneous Given 01/07/20 1135)  methylPREDNISolone sodium succinate (SOLU-MEDROL) 40 mg/mL injection 40 mg (has no administration in time range)  guaiFENesin (MUCINEX) 12 hr tablet 600 mg (600 mg Oral Not Given 01/07/20 0905)  furosemide (LASIX) injection 60 mg (has no administration in time range)  furosemide (LASIX) injection 80 mg (80 mg Intravenous Given 01/07/20 0604)  nitroGLYCERIN (NITROGLYN) 2 % ointment 1 inch (1 inch Topical Given 01/07/20 0603)  LORazepam (ATIVAN) injection 0.5 mg (0.5 mg Intravenous Given 01/07/20 0609)  cefTRIAXone (ROCEPHIN) 1 g in sodium chloride 0.9 % 100 mL IVPB (1 g Intravenous New Bag/Given 01/07/20 0749)  azithromycin (ZITHROMAX) 500 mg in sodium chloride 0.9 % 250 mL IVPB (500 mg Intravenous New Bag/Given 01/07/20 0840)  methylPREDNISolone sodium succinate (SOLU-MEDROL) 125 mg/2 mL injection 125 mg (125 mg Intravenous Given 01/07/20 0905)    Mobility non-ambulatory     Focused Assessments Cardiac  Assessment Handoff:    Lab Results  Component Value Date   TROPONINI 0.04 ([place]) 12/21/2016   No results found for: DDIMER Does the Patient currently have chest pain? No  , Pulmonary Assessment Handoff:  Lung sounds: Bilateral Breath Sounds: Other (Comment), Diminished(Coarse) L Breath Sounds: Rales R Breath Sounds: Rales O2 Device: Nasal Cannula O2 Flow Rate (L/min): 3 L/min      R Recommendations: See Admitting Provider Note  Report given to:   Additional Notes:

## 2020-01-07 NOTE — Progress Notes (Signed)
*  PRELIMINARY RESULTS* Echocardiogram 2D Echocardiogram has been performed.  [name] 01/07/2020, 1:52 PM

## 2020-01-07 NOTE — Progress Notes (Signed)
  F102 on Bipap down to 40 % on Bipap PEEP of 5 RR 20 Pressure 8CM H20 above PEEP  -Resting, talking and answers appropriately Voiding- Air movement is fair  Oxy sats 98 to 99% on F102 of 50%, down to 96 % on Fi102 of 40 % warming blanket on   Roxan Hockey, MD

## 2020-01-07 NOTE — Progress Notes (Signed)
Pt placed on 3L [place]. Bipap on standby

## 2020-01-07 NOTE — Progress Notes (Signed)
Bipap is a PRN order, no distress noted at this time.  Patient is currently on 2L with upper 90 sats.  Will continue to monitor.

## 2020-01-07 NOTE — ED Notes (Signed)
Unable to complete triage due to pt condition.

## 2020-01-07 NOTE — H&P (Signed)
Patient Demographics:    [name], is a 74 y.o. male  MRN: [id number]   DOB - 26-Sep-1945  Admit Date - 01/07/2020  Outpatient Primary MD for the patient is Celene Squibb, MD   Assessment & Plan:    Principal Problem:   Respiratory failure with hypoxia [facility]) Active Problems:   Coronary artery disease   Tobacco abuse    1) acute hypoxic respiratory failure secondary to systolic CHF exacerbation --Initially required BiPAP for prolonged periods of time -With IV diuresis patient has been weaned to nasal cannula at this time -BNP over 1600, chest x-ray with CHF findings -Continue IV diuresis -Troponin in the 40s  2)*CKD 4--- creatinine on admission 1.8 which is close to patient's recent baseline  , renally adjust medications, avoid nephrotoxic agents / dehydration  / hypotension  3) history of COPD and ongoing tobacco abuse--- suspect some degree of COPD exacerbation, treat empirically with Solu-Medrol mucolytics and bronchodilators as ordered  4)CAP--chest x-ray suggestive of possible concomitant pneumonia treat empirically with Rocephin and azithromycin as well as bronchodilators and mucolytics  5)CAD--currently chest pain-free, troponin noted, continue Lipitor, give aspirin, continue Coreg  6)HFrEF--acute on chronic combined diastolic and systolic dysfunction exacerbation treat as above #1 -Shows EF is down to 10 to 15% from prior EF of 15 to 20%, echo also showed grade 1 diastolic dysfunction -Continue IV diuresis as above #1  7) social/ethics-patient is a full code, given severe comorbidities we get palliative care consult  With History of - Reviewed by me  Past Medical History:  Diagnosis Date   Arteriosclerotic cardiovascular disease (ASCVD)    IMI in 5/07 with CTO mid cx, 60% LAD, 99%  RCA-> BMS; mod. impaired LV function; EF:35-40% 2/10   Chronic kidney disease    STAGE 3-4-creatinine 2.5 in 2009; 1.7 in 10/2008   Cirrhosis ([place])    History of excessive alcohol use; continuing social use   Gastroesophageal reflux disease    Hyperlipidemia    Tobacco abuse    60 pack years; one pack per day      Past Surgical History:  Procedure Laterality Date   BIOPSY  12/05/2018   Procedure: BIOPSY;  Surgeon: Rogene Houston, MD;  [facility];  Service: Endoscopy;;  gastric   ESOPHAGOGASTRODUODENOSCOPY N/A 12/19/2016   Procedure: ESOPHAGOGASTRODUODENOSCOPY (EGD);  Surgeon: Rogene Houston, MD;  Location[facility];  Service: Endoscopy;  Laterality: N/A;   ESOPHAGOGASTRODUODENOSCOPY N/A 12/21/2016   Procedure: ESOPHAGOGASTRODUODENOSCOPY (EGD);  Surgeon: Danie Binder, MD;  [facility];  Service: Endoscopy;  Laterality: N/A;   ESOPHAGOGASTRODUODENOSCOPY (EGD) WITH PROPOFOL N/A 12/22/2016   Procedure: ESOPHAGOGASTRODUODENOSCOPY (EGD) WITH PROPOFOL;  Surgeon: Mauri Pole, MD;  Location: [facility];  Service: Endoscopy;  Laterality: N/A;   ESOPHAGOGASTRODUODENOSCOPY (EGD) WITH PROPOFOL N/A 12/05/2018   Procedure: ESOPHAGOGASTRODUODENOSCOPY (EGD) WITH PROPOFOL;  Surgeon: Rogene Houston, MD;  [facility]: [facility];  Service: Endoscopy;  Laterality:  N/A;  1:35      Chief Complaint  Patient presents with   Shortness of Breath      HPI:    [name]  is a 74 y.o. male  with medical history significant for coronary artery disease with systolic CHF, cirrhosis, gastric ulcer, CKD IV -center with EMS with sudden respiratory distress and profound hypoxia with O2 sats in the 70s,  --Initially required BiPAP for prolonged periods of time -With IV diuresis patient has been weaned to nasal cannula at this time -BNP over 1600, chest x-ray with CHF findings -Troponin in the 40s -Denies chest pain specifically but patient was quite wheezy  initially -By EDP patient kept saying I can't breathe --Received Rocephin/azithromycin and Lasix in the ED with improvement on continuous BiPAP -Denies pleuritic symptoms     Review of systems:    In addition to the HPI above,   A full Review of  Systems was done, all other systems reviewed are negative except as noted above in HPI , .    Social History:  Reviewed by me    Social History   Tobacco Use   Smoking status: Current Every Day Smoker    Packs/day: 1.00    Years: 60.00    Pack years: 60.00   Smokeless tobacco: Never Used  Substance Use Topics   Alcohol use: Yes    Comment: per sister "he had one beer last week"       Family History :  Reviewed by me    Family History  Problem Relation Age of Onset   Other Brother      Home Medications:   Prior to Admission medications   Medication Sig Start Date End Date Taking? Authorizing Provider  allopurinol (ZYLOPRIM) 100 MG tablet Take 100 mg by mouth daily.   Yes [provider]  atorvastatin (LIPITOR) 40 MG tablet TAKE 1 TABLET BY MOUTH ONCE DAILY WITH SUPPER. Patient taking differently: Take 40 mg by mouth daily.  12/30/19  Yes Corum, Rex Kras, MD  carvedilol (COREG) 12.5 MG tablet TAKE 1 TABLET BY MOUTH TWICE DAILY. Patient taking differently: Take 12.5 mg by mouth 2 (two) times daily with a meal.  12/30/19  Yes Corum, Rex Kras, MD  folic acid (FOLVITE) 1 MG tablet TAKE 1 TABLET BY MOUTH ONCE A DAY. Patient taking differently: Take 1 mg by mouth daily.  12/30/19  Yes Corum, Rex Kras, MD  Magnesium Oxide 400 (240 Mg) MG TABS Take 1 tablet by mouth daily. 11/30/19  Yes [provider]  Multiple Vitamin (DAILY-VITE MULTIVITAMIN) TABS Take 1 tablet by mouth daily. 11/30/19  Yes [provider]  nitroGLYCERIN (NITROSTAT) 0.4 MG SL tablet Place 0.4 mg under the tongue every 5 (five) minutes as needed for chest pain.    Yes [provider]  pantoprazole (PROTONIX) 40 MG tablet TAKE 1  TABLET BY MOUTH ONCE DAILY BEFORE BREAKFAST. Patient taking differently: Take 40 mg by mouth daily.  11/30/19  Yes Rehman, Mechele Dawley, MD  thiamine 100 MG tablet Take 1 tablet (100 mg total) by mouth daily. Patient taking differently: Take 50 mg by mouth daily.  12/27/16  Yes Reyne Dumas, MD     Allergies:    No Known Allergies   Physical Exam:   Vitals  Blood pressure (!) 148/71, pulse 79, temperature (!) 97.4 F (36.3 C), temperature source Axillary, resp. rate (!) 23, height 5\' 6"  (1.676 m), weight 52.4 kg, SpO2 99 %.  Physical Examination: General appearance -  alert, chronically ill appearing, in severe respiratory distress Mental status - alert, oriented to person, place, and time,  Eyes - sclera anicteric Neck - supple, +ve JVD elevation , Chest -diminished in bases with scattered rales, few wheezes in upper lung fields heart - S1 and S2 normal, regular  Abdomen - soft, nontender, nondistended, no masses or organomegaly Neurological - screening mental status exam normal, neck supple without rigidity, cranial nerves II through XII intact, DTR's normal and symmetric Extremities - _+pedal edema noted, intact peripheral pulses  Skin - warm, dry     Data Review:    CBC Recent Labs  Lab 01/07/20 0602  WBC 7.7  HGB 11.1*  HCT 36.3*  PLT 207  MCV 95.8  MCH 29.3  MCHC 30.6  RDW 15.6*  LYMPHSABS 5.2*  MONOABS 0.2  EOSABS 0.5  BASOSABS 0.1   ------------------------------------------------------------------------------------------------------------------  Chemistries  Recent Labs  Lab 01/07/20 0602  NA 138  K 4.5  CL 104  CO2 20*  GLUCOSE 234*  BUN 16  CREATININE 1.82*  CALCIUM 8.6*  AST 32  ALT 19  ALKPHOS 67  BILITOT 0.5   ------------------------------------------------------------------------------------------------------------------ estimated creatinine clearance is 26 mL/min (A) (by C-G formula based on SCr of 1.82 mg/dL  (H)). ------------------------------------------------------------------------------------------------------------------ No results for input(s): TSH, T4TOTAL, T3FREE, THYROIDAB in the last 72 hours.  Invalid input(s): FREET3   Coagulation profile No results for input(s): INR, PROTIME in the last 168 hours. ------------------------------------------------------------------------------------------------------------------- No results for input(s): DDIMER in the last 72 hours. -------------------------------------------------------------------------------------------------------------------  Cardiac Enzymes No results for input(s): CKMB, TROPONINI, MYOGLOBIN in the last 168 hours.  Invalid input(s): CK ------------------------------------------------------------------------------------------------------------------    Component Value Date/Time   BNP 1,603.0 (H) 01/07/2020 0602     ---------------------------------------------------------------------------------------------------------------  Urinalysis    Component Value Date/Time   COLORURINE AMBER (A) 06/08/2019 1132   APPEARANCEUR CLOUDY (A) 06/08/2019 1132   LABSPEC 1.012 06/08/2019 1132   PHURINE 5.0 06/08/2019 1132   GLUCOSEU NEGATIVE 06/08/2019 1132   HGBUR NEGATIVE 06/08/2019 1132   BILIRUBINUR NEGATIVE 06/08/2019 1132   KETONESUR NEGATIVE 06/08/2019 1132   PROTEINUR NEGATIVE 06/08/2019 1132   NITRITE NEGATIVE 06/08/2019 1132   LEUKOCYTESUR LARGE (A) 06/08/2019 1132    ----------------------------------------------------------------------------------------------------------------   Imaging Results:    DG Chest Port 1 View  Result Date: 01/07/2020 CLINICAL DATA:  Acute onset shortness of breath.  Hypoxia. EXAM: PORTABLE CHEST 1 VIEW COMPARISON:  One-view chest x-ray 12/06/2018 FINDINGS: Heart size is exaggerated by low lung volumes. Atherosclerotic calcifications are present at the arch. Mild diffuse edema is  present. Right greater than left lower lobe airspace disease is present. Right effusion is noted. IMPRESSION: 1. Dense right lower lobe airspace disease compatible with pneumonia. 2. Slightly less prominent left lower lobe airspace disease is also concerning for infection. 3. Mild diffuse edema. 4. Right pleural effusion. 5. Atherosclerosis. Electronically Signed   By: San Morelle M.D.   On: 01/07/2020 06:28   ECHOCARDIOGRAM COMPLETE  Result Date: 01/07/2020    ECHOCARDIOGRAM REPORT   Patient Name:   QUINT CHESTNUT Date of Exam: 01/07/2020 Medical Rec #:  [id number]          Height:       66.0 in Accession #:    [id number]         Weight:       120.0 lb Date of Birth:  August 13, 1946          BSA:          1.610 m Patient  Age:    32 years           BP:           150/74 mmHg Patient Gender: M                  HR:           86 bpm. Exam Location:  [facility] Procedure: 2D Echo, Cardiac Doppler and Color Doppler Indications:    Dyspnea 786.09 / R06.00  History:        Patient has prior history of Echocardiogram examinations, most                 recent 12/19/2016. CHF, CAD; Risk Factors:Dyslipidemia. Alcohol                 dependence,Acute renal failure (ARF),Cirrhosis,Chronic systolic                 congestive heart failure,Tobacco abuse.  Sonographer:    Alvino Chapel RCS Referring Phys: [phone] Kevontae Burgoon IMPRESSIONS  1. Left ventricular ejection fraction, by estimation, is 10-15%. The left ventricle has severely decreased function. The left ventricle demonstrates global hypokinesis. The left ventricular internal cavity size was mildly dilated. Left ventricular diastolic parameters are consistent with Grade I diastolic dysfunction (impaired relaxation). Elevated left ventricular end-diastolic pressure.  2. Right ventricular systolic function is mildly reduced. The right ventricular size is normal. There is mildly elevated pulmonary artery systolic pressure.  3. Left atrial size was severely dilated.   4. The mitral valve is grossly normal. Mild mitral valve regurgitation.  5. The aortic valve is tricuspid. Aortic valve regurgitation is trivial. No aortic stenosis is present.  6. The inferior vena cava is dilated in size with <50% respiratory variability, suggesting right atrial pressure of 15 mmHg. FINDINGS  Left Ventricle: Left ventricular ejection fraction, by estimation, is 10-15%. The left ventricle has severely decreased function. The left ventricle demonstrates global hypokinesis. The left ventricular internal cavity size was mildly dilated. There is no left ventricular hypertrophy. Left ventricular diastolic parameters are consistent with Grade I diastolic dysfunction (impaired relaxation). Elevated left ventricular end-diastolic pressure.  LV Wall Scoring: The anterior septum is akinetic. Right Ventricle: The right ventricular size is normal. No increase in right ventricular wall thickness. Right ventricular systolic function is mildly reduced. There is mildly elevated pulmonary artery systolic pressure. The tricuspid regurgitant velocity  is 2.45 m/s, and with an assumed right atrial pressure of 15 mmHg, the estimated right ventricular systolic pressure is 04.5 mmHg. Left Atrium: Left atrial size was severely dilated. Right Atrium: Right atrial size was normal in size. Pericardium: There is no evidence of pericardial effusion. Mitral Valve: The mitral valve is grossly normal. Mild mitral valve regurgitation. Tricuspid Valve: The tricuspid valve is grossly normal. Tricuspid valve regurgitation is mild. Aortic Valve: The aortic valve is tricuspid. Aortic valve regurgitation is trivial. No aortic stenosis is present. Pulmonic Valve: The pulmonic valve was not well visualized. Pulmonic valve regurgitation is not visualized. Aorta: The aortic root is normal in size and structure. Venous: The inferior vena cava is dilated in size with less than 50% respiratory variability, suggesting right atrial pressure of  15 mmHg. IAS/Shunts: No atrial level shunt detected by color flow Doppler.  LEFT VENTRICLE PLAX 2D LVIDd:         5.39 cm      Diastology LVIDs:         5.24 cm      LV e' lateral:  7.07 cm/s LV PW:         0.92 cm      LV E/e' lateral: 12.6 LV IVS:        0.88 cm      LV e' medial:    3.92 cm/s LVOT diam:     2.00 cm      LV E/e' medial:  22.7 LV SV:         39 LV SV Index:   24 LVOT Area:     3.14 cm  LV Volumes (MOD) LV vol d, MOD A2C: 130.0 ml LV vol d, MOD A4C: 136.0 ml LV vol s, MOD A2C: 118.0 ml LV vol s, MOD A4C: 106.0 ml LV SV MOD A2C:     12.0 ml LV SV MOD A4C:     136.0 ml LV SV MOD BP:      18.1 ml RIGHT VENTRICLE RV S prime:     15.10 cm/s TAPSE (M-mode): 1.4 cm LEFT ATRIUM             Index LA diam:        3.20 cm 1.99 cm/m LA Vol (A2C):   65.0 ml 40.38 ml/m LA Vol (A4C):   66.5 ml 41.32 ml/m LA Biplane Vol: 67.4 ml 41.88 ml/m  AORTIC VALVE LVOT Vmax:   64.30 cm/s LVOT Vmean:  39.400 cm/s LVOT VTI:    0.124 m MITRAL VALVE               TRICUSPID VALVE MV Area (PHT): 5.32 cm    TR Peak grad:   24.0 mmHg MV Decel Time: 143 msec    TR Vmax:        245.00 cm/s MV E velocity: 89.10 cm/s MV A velocity: 89.80 cm/s  SHUNTS MV E/A ratio:  0.99        Systemic VTI:  0.12 m                            Systemic Diam: 2.00 cm Kate Sable MD Electronically signed by Kate Sable MD Signature Date/Time: 01/07/2020/2:47:16 PM    Final     Radiological Exams on Admission: DG Chest Port 1 View  Result Date: 01/07/2020 CLINICAL DATA:  Acute onset shortness of breath.  Hypoxia. EXAM: PORTABLE CHEST 1 VIEW COMPARISON:  One-view chest x-ray 12/06/2018 FINDINGS: Heart size is exaggerated by low lung volumes. Atherosclerotic calcifications are present at the arch. Mild diffuse edema is present. Right greater than left lower lobe airspace disease is present. Right effusion is noted. IMPRESSION: 1. Dense right lower lobe airspace disease compatible with pneumonia. 2. Slightly less prominent left lower lobe  airspace disease is also concerning for infection. 3. Mild diffuse edema. 4. Right pleural effusion. 5. Atherosclerosis. Electronically Signed   By: San Morelle M.D.   On: 01/07/2020 06:28   ECHOCARDIOGRAM COMPLETE  Result Date: 01/07/2020    ECHOCARDIOGRAM REPORT   Patient Name:   MOUSTAFA MOSSA Date of Exam: 01/07/2020 Medical Rec #:  [id number]          Height:       66.0 in Accession #:    [id number]         Weight:       120.0 lb Date of Birth:  1946/05/28          BSA:          1.610 m Patient Age:    33 years  BP:           150/74 mmHg Patient Gender: M                  HR:           86 bpm. Exam Location:  [facility] Procedure: 2D Echo, Cardiac Doppler and Color Doppler Indications:    Dyspnea 786.09 / R06.00  History:        Patient has prior history of Echocardiogram examinations, most                 recent 12/19/2016. CHF, CAD; Risk Factors:Dyslipidemia. Alcohol                 dependence,Acute renal failure (ARF),Cirrhosis,Chronic systolic                 congestive heart failure,Tobacco abuse.  Sonographer:    Alvino Chapel RCS Referring Phys: [phone] Sharanda Shinault IMPRESSIONS  1. Left ventricular ejection fraction, by estimation, is 10-15%. The left ventricle has severely decreased function. The left ventricle demonstrates global hypokinesis. The left ventricular internal cavity size was mildly dilated. Left ventricular diastolic parameters are consistent with Grade I diastolic dysfunction (impaired relaxation). Elevated left ventricular end-diastolic pressure.  2. Right ventricular systolic function is mildly reduced. The right ventricular size is normal. There is mildly elevated pulmonary artery systolic pressure.  3. Left atrial size was severely dilated.  4. The mitral valve is grossly normal. Mild mitral valve regurgitation.  5. The aortic valve is tricuspid. Aortic valve regurgitation is trivial. No aortic stenosis is present.  6. The inferior vena cava is dilated in size  with <50% respiratory variability, suggesting right atrial pressure of 15 mmHg. FINDINGS  Left Ventricle: Left ventricular ejection fraction, by estimation, is 10-15%. The left ventricle has severely decreased function. The left ventricle demonstrates global hypokinesis. The left ventricular internal cavity size was mildly dilated. There is no left ventricular hypertrophy. Left ventricular diastolic parameters are consistent with Grade I diastolic dysfunction (impaired relaxation). Elevated left ventricular end-diastolic pressure.  LV Wall Scoring: The anterior septum is akinetic. Right Ventricle: The right ventricular size is normal. No increase in right ventricular wall thickness. Right ventricular systolic function is mildly reduced. There is mildly elevated pulmonary artery systolic pressure. The tricuspid regurgitant velocity  is 2.45 m/s, and with an assumed right atrial pressure of 15 mmHg, the estimated right ventricular systolic pressure is 04.5 mmHg. Left Atrium: Left atrial size was severely dilated. Right Atrium: Right atrial size was normal in size. Pericardium: There is no evidence of pericardial effusion. Mitral Valve: The mitral valve is grossly normal. Mild mitral valve regurgitation. Tricuspid Valve: The tricuspid valve is grossly normal. Tricuspid valve regurgitation is mild. Aortic Valve: The aortic valve is tricuspid. Aortic valve regurgitation is trivial. No aortic stenosis is present. Pulmonic Valve: The pulmonic valve was not well visualized. Pulmonic valve regurgitation is not visualized. Aorta: The aortic root is normal in size and structure. Venous: The inferior vena cava is dilated in size with less than 50% respiratory variability, suggesting right atrial pressure of 15 mmHg. IAS/Shunts: No atrial level shunt detected by color flow Doppler.  LEFT VENTRICLE PLAX 2D LVIDd:         5.39 cm      Diastology LVIDs:         5.24 cm      LV e' lateral:   7.07 cm/s LV PW:         0.92 cm  LV  E/e' lateral: 12.6 LV IVS:        0.88 cm      LV e' medial:    3.92 cm/s LVOT diam:     2.00 cm      LV E/e' medial:  22.7 LV SV:         39 LV SV Index:   24 LVOT Area:     3.14 cm  LV Volumes (MOD) LV vol d, MOD A2C: 130.0 ml LV vol d, MOD A4C: 136.0 ml LV vol s, MOD A2C: 118.0 ml LV vol s, MOD A4C: 106.0 ml LV SV MOD A2C:     12.0 ml LV SV MOD A4C:     136.0 ml LV SV MOD BP:      18.1 ml RIGHT VENTRICLE RV S prime:     15.10 cm/s TAPSE (M-mode): 1.4 cm LEFT ATRIUM             Index LA diam:        3.20 cm 1.99 cm/m LA Vol (A2C):   65.0 ml 40.38 ml/m LA Vol (A4C):   66.5 ml 41.32 ml/m LA Biplane Vol: 67.4 ml 41.88 ml/m  AORTIC VALVE LVOT Vmax:   64.30 cm/s LVOT Vmean:  39.400 cm/s LVOT VTI:    0.124 m MITRAL VALVE               TRICUSPID VALVE MV Area (PHT): 5.32 cm    TR Peak grad:   24.0 mmHg MV Decel Time: 143 msec    TR Vmax:        245.00 cm/s MV E velocity: 89.10 cm/s MV A velocity: 89.80 cm/s  SHUNTS MV E/A ratio:  0.99        Systemic VTI:  0.12 m                            Systemic Diam: 2.00 cm Kate Sable MD Electronically signed by Kate Sable MD Signature Date/Time: 01/07/2020/2:47:16 PM    Final     DVT Prophylaxis -SCD  /heparin AM Labs Ordered, also please review Full Orders  Family Communication: Admission, patients condition and plan of care including tests being ordered have been discussed with the patient who indicate understanding and agree with the plan   Code Status - Full Code  Likely DC to  Home with [facility]  Condition   fair  Roxan Hockey M.D on 01/07/2020 at 7:14 PM Go to [email or web address] -  for contact info  [facility] - Office  [phone]

## 2020-01-07 NOTE — ED Notes (Signed)
Date and time results received: 01/07/20 8:09 AM   Test: venous blood gas- PO2 Critical Value: <31  Name of Provider Notified: Long, MD  Orders Received? Or Actions Taken?: na

## 2020-01-07 NOTE — ED Provider Notes (Signed)
[facility] EMERGENCY DEPARTMENT Provider Note   CSN: [id number] Arrival date & time: 01/07/20  0555   Time seen 5:52 AM (on arrival)  History Chief Complaint  Patient presents with  . Shortness of Breath   Level 5 caveat for respiratory distress  [name] is a 74 y.o. male.  HPI   EMS reports they were called for acute onset of shortness of breath.  They report patient's initial pulse ox was 70%.  They heard rales diffusely.  Patient had taken 1 nitroglycerin at home that was not his.  They tried to put patient on CPAP however he would not tolerate it.  They rolled him into the ED in a wheelchair.  Patient just keeps repetitively saying "I can't breathe".  PCP Celene Squibb, MD   Past Medical History:  Diagnosis Date  . Arteriosclerotic cardiovascular disease (ASCVD)    IMI in 5/07 with CTO mid cx, 60% LAD, 99% RCA-> BMS; mod. impaired LV function; EF:35-40% 2/10  . Chronic kidney disease    STAGE 3-4-creatinine 2.5 in 2009; 1.7 in 10/2008  . Cirrhosis ([place])    History of excessive alcohol use; continuing social use  . Gastroesophageal reflux disease   . Hyperlipidemia   . Tobacco abuse    60 pack years; one pack per day    Patient Active Problem List   Diagnosis Date Noted  . Acute gout of left hand 09/14/2019  . Cognitive impairment 06/11/2019  . Acute kidney injury ([place]) 06/09/2019  . Hyperkalemia 06/09/2019  . Gastric ulcer without hemorrhage or perforation 11/21/2018  . Hematochezia   . Chronic systolic congestive heart failure ([place])   . Hemorrhagic shock and encephalopathy syndrome ([place])   . Acute upper GI bleed   . Gastric ulcer 12/20/2016  . Acute blood loss anemia 12/20/2016  . Hypotension 12/18/2016  . Volume depletion 12/18/2016  . Ischemic cardiomyopathy 12/18/2016  . Anemia 12/18/2016  . Heme + stool 12/18/2016  . Dehydration   . Thrombocytopenia ([place])   . Malnutrition of moderate degree 12/15/2016  . AKI (acute kidney injury) ([place])  12/13/2016  . Unintentional weight loss 12/13/2016  . Alcohol dependence ([place]) 12/13/2016  . Hyperglycemia 12/13/2016  . Elevated troponin 12/13/2016  . Anemia, normocytic normochromic 03/28/2012  . Coronary artery disease   . Gastroesophageal reflux disease   . Chronic kidney disease   . Cirrhosis ([place])   . Tobacco abuse     Past Surgical History:  Procedure Laterality Date  . BIOPSY  12/05/2018   Procedure: BIOPSY;  Surgeon: Rogene Houston, MD;  [facility];  Service: Endoscopy;;  gastric  . ESOPHAGOGASTRODUODENOSCOPY N/A 12/19/2016   Procedure: ESOPHAGOGASTRODUODENOSCOPY (EGD);  Surgeon: Rogene Houston, MD;  Location: [facility];  Service: Endoscopy;  Laterality: N/A;  . ESOPHAGOGASTRODUODENOSCOPY N/A 12/21/2016   Procedure: ESOPHAGOGASTRODUODENOSCOPY (EGD);  Surgeon: Danie Binder, MD;  Location: [facility];  Service: Endoscopy;  Laterality: N/A;  . ESOPHAGOGASTRODUODENOSCOPY (EGD) WITH PROPOFOL N/A 12/22/2016   Procedure: ESOPHAGOGASTRODUODENOSCOPY (EGD) WITH PROPOFOL;  Surgeon: Mauri Pole, MD;  Location: [facility];  Service: Endoscopy;  Laterality: N/A;  . ESOPHAGOGASTRODUODENOSCOPY (EGD) WITH PROPOFOL N/A 12/05/2018   Procedure: ESOPHAGOGASTRODUODENOSCOPY (EGD) WITH PROPOFOL;  Surgeon: Rogene Houston, MD;  Location: [facility];  Service: Endoscopy;  Laterality: N/A;  1:35       Family History  Problem Relation Age of Onset  . Other Brother     Social History   Tobacco Use  .  Smoking status: Current Every Day Smoker    Packs/day: 1.00    Years: 60.00    Pack years: 60.00  . Smokeless tobacco: Never Used  Substance Use Topics  . Alcohol use: Yes    Comment: per sister "he had one beer last week"  . Drug use: No  Lives at home  Home Medications Prior to Admission medications   Medication Sig Start Date End Date Taking? Authorizing Provider  allopurinol (ZYLOPRIM) 100 MG tablet Take 100 mg by mouth daily.    [provider]    atorvastatin (LIPITOR) 40 MG tablet TAKE 1 TABLET BY MOUTH ONCE DAILY WITH SUPPER. 12/30/19   Corum, Rex Kras, MD  carvedilol (COREG) 12.5 MG tablet TAKE 1 TABLET BY MOUTH TWICE DAILY. 12/30/19   Corum, Rex Kras, MD  colchicine 0.6 MG tablet Take 1 tablet (0.6 mg total) by mouth 2 (two) times daily. 12/26/16 11/27/18  Reyne Dumas, MD  folic acid (FOLVITE) 1 MG tablet TAKE 1 TABLET BY MOUTH ONCE A DAY. 12/30/19   Corum, Rex Kras, MD  magnesium oxide (MAG-OX) 400 (241.3 Mg) MG tablet Take 1 tablet (400 mg total) by mouth daily. 12/27/16   Reyne Dumas, MD  methylPREDNISolone (MEDROL DOSEPAK) 4 MG TBPK tablet Take 4 mg by mouth as directed.  06/04/19   [provider]  Multiple Vitamins-Minerals (MULTIVITAMIN WITH MINERALS) tablet Take 1 tablet by mouth daily.    [provider]  nitroGLYCERIN (NITROSTAT) 0.4 MG SL tablet Place 0.4 mg under the tongue as needed.      [provider]  pantoprazole (PROTONIX) 40 MG tablet TAKE 1 TABLET BY MOUTH ONCE DAILY BEFORE BREAKFAST. 11/30/19   Rogene Houston, MD  thiamine 100 MG tablet Take 1 tablet (100 mg total) by mouth daily. Patient taking differently: Take 50 mg by mouth daily.  12/27/16   Reyne Dumas, MD  vitamin B-12 (CYANOCOBALAMIN) 50 MCG tablet Take 50 mcg by mouth daily.    [provider]    Allergies    Patient has no known allergies.  Review of Systems   Review of Systems  Unable to perform ROS: Severe respiratory distress    Physical Exam Updated Vital Signs BP (!) 164/96   Pulse (!) 108   Resp (!) 31   Ht 5\' 6"  (1.676 m)   Wt 54.4 kg   SpO2 93%   BMI 19.37 kg/m   Vital signs normal except for hypertension and tachycardia and tachypnea   Physical Exam Vitals and nursing note reviewed.  Constitutional:      Comments: Thin frail elderly man in acute distress  HENT:     Head: Normocephalic and atraumatic.     Nose: Nose normal.     Mouth/Throat:     Comments: Edentulous Eyes:     Extraocular  Movements: Extraocular movements intact.     Conjunctiva/sclera: Conjunctivae normal.     Pupils: Pupils are equal, round, and reactive to light.  Cardiovascular:     Rate and Rhythm: Regular rhythm. Tachycardia present.     Pulses: Normal pulses.     Heart sounds: Normal heart sounds.  Pulmonary:     Effort: Tachypnea, accessory muscle usage, prolonged expiration and respiratory distress present.     Breath sounds: Decreased air movement present. Rales present.  Abdominal:     General: Abdomen is flat. Bowel sounds are normal. There is no distension.     Palpations: Abdomen is soft.  Musculoskeletal:  General: No swelling or deformity.     Cervical back: Normal range of motion.     Right lower leg: No edema.     Left lower leg: No edema.  Skin:    General: Skin is dry.  Neurological:     General: No focal deficit present.     Cranial Nerves: No cranial nerve deficit.  Psychiatric:        Mood and Affect: Mood is anxious.        Speech: Speech is rapid and pressured.        Behavior: Behavior is uncooperative.     ED Results / Procedures / Treatments   Labs (all labs ordered are listed, but only abnormal results are displayed) Results for orders placed or performed during the hospital encounter of 01/07/20  Respiratory Panel by RT PCR (Flu A&B, Covid) - Nasopharyngeal Swab   Specimen: Nasopharyngeal Swab  Result Value Ref Range   SARS Coronavirus 2 by RT PCR NEGATIVE NEGATIVE   Influenza A by PCR NEGATIVE NEGATIVE   Influenza B by PCR NEGATIVE NEGATIVE  Comprehensive metabolic panel  Result Value Ref Range   Sodium 138 135 - 145 mmol/L   Potassium 4.5 3.5 - 5.1 mmol/L   Chloride 104 98 - 111 mmol/L   CO2 20 (L) 22 - 32 mmol/L   Glucose, Bld 234 (H) 70 - 99 mg/dL   BUN 16 8 - 23 mg/dL   Creatinine, Ser 1.82 (H) 0.61 - 1.24 mg/dL   Calcium 8.6 (L) 8.9 - 10.3 mg/dL   Total Protein 7.9 6.5 - 8.1 g/dL   Albumin 3.1 (L) 3.5 - 5.0 g/dL   AST 32 15 - 41 U/L   ALT 19  0 - 44 U/L   Alkaline Phosphatase 67 38 - 126 U/L   Total Bilirubin 0.5 0.3 - 1.2 mg/dL   GFR calc non Af Amer 36 (L) >60 mL/min   GFR calc Af Amer 41 (L) >60 mL/min   Anion gap 14 5 - 15  Brain natriuretic peptide  Result Value Ref Range   B Natriuretic Peptide 1,603.0 (H) 0.0 - 100.0 pg/mL  CBC with Differential  Result Value Ref Range   WBC 7.7 4.0 - 10.5 K/uL   RBC 3.79 (L) 4.22 - 5.81 MIL/uL   Hemoglobin 11.1 (L) 13.0 - 17.0 g/dL   HCT 36.3 (L) 39.0 - 52.0 %   MCV 95.8 80.0 - 100.0 fL   MCH 29.3 26.0 - 34.0 pg   MCHC 30.6 30.0 - 36.0 g/dL   RDW 15.6 (H) 11.5 - 15.5 %   Platelets 207 150 - 400 K/uL   nRBC 0.0 0.0 - 0.2 %   Neutrophils Relative % 21 %   Neutro Abs 1.8 1.7 - 7.7 K/uL   Band Neutrophils 2 %   Lymphocytes Relative 67 %   Lymphs Abs 5.2 (H) 0.7 - 4.0 K/uL   Monocytes Relative 3 %   Monocytes Absolute 0.2 0.1 - 1.0 K/uL   Eosinophils Relative 6 %   Eosinophils Absolute 0.5 0.0 - 0.5 K/uL   Basophils Relative 1 %   Basophils Absolute 0.1 0.0 - 0.1 K/uL   WBC Morphology Abnormal lymphocytes present    Smudge Cells PRESENT   Ethanol  Result Value Ref Range   Alcohol, Ethyl (B) <10 <10 mg/dL  Troponin I (High Sensitivity)  Result Value Ref Range   Troponin I (High Sensitivity) 36 (H) <18 ng/L   Laboratory interpretation all normal except mild  anemia, renal insufficiency, hyperglycemia, malnutrition, marked elevation of BNP, chronically elevated troponin in the 30s    EKG EKG Interpretation  Date/Time:  Thursday January 07 2020 06:04:25 EDT Ventricular Rate:  105 PR Interval:    QRS Duration: 141 QT Interval:  368 QTC Calculation: 487 R Axis:   76 Text Interpretation: Sinus tachycardia LVH with secondary repolarization abnormality Anterior Q waves, possibly due to LVH Since last tracing rate faster 08 Jun 2019 Confirmed by Rolland Porter [phone]) on 01/07/2020 6:06:26 AM   Radiology DG Chest Port 1 View  Result Date: 01/07/2020 CLINICAL DATA:  Acute  onset shortness of breath.  Hypoxia. EXAM: PORTABLE CHEST 1 VIEW COMPARISON:  One-view chest x-ray 12/06/2018 FINDINGS: Heart size is exaggerated by low lung volumes. Atherosclerotic calcifications are present at the arch. Mild diffuse edema is present. Right greater than left lower lobe airspace disease is present. Right effusion is noted. IMPRESSION: 1. Dense right lower lobe airspace disease compatible with pneumonia. 2. Slightly less prominent left lower lobe airspace disease is also concerning for infection. 3. Mild diffuse edema. 4. Right pleural effusion. 5. Atherosclerosis. Electronically Signed   By: San Morelle M.D.   On: 01/07/2020 06:28    Procedures .Critical Care Performed by: Rolland Porter, MD Authorized by: Rolland Porter, MD   Critical care provider statement:    Critical care time (minutes):  42   Critical care was necessary to treat or prevent imminent or life-threatening deterioration of the following conditions:  Circulatory failure and respiratory failure   Critical care was time spent personally by me on the following activities:  Discussions with consultants, examination of patient, obtaining history from patient or surrogate, ordering and review of laboratory studies, ordering and review of radiographic studies, pulse oximetry and re-evaluation of patient's condition   (including critical care time)  Medications Ordered in ED Medications  cefTRIAXone (ROCEPHIN) 1 g in sodium chloride 0.9 % 100 mL IVPB (has no administration in time range)  azithromycin (ZITHROMAX) 500 mg in sodium chloride 0.9 % 250 mL IVPB (has no administration in time range)  furosemide (LASIX) injection 80 mg (80 mg Intravenous Given 01/07/20 0604)  nitroGLYCERIN (NITROGLYN) 2 % ointment 1 inch (1 inch Topical Given 01/07/20 0603)  LORazepam (ATIVAN) injection 0.5 mg (0.5 mg Intravenous Given 01/07/20 9767)    ED Course  I have reviewed the triage vital signs and the nursing notes.  Pertinent  labs & imaging results that were available during my care of the patient were reviewed by me and considered in my medical decision making (see chart for details).    MDM Rules/Calculators/A&P                     Patient is in severe respiratory distress, he keeps repetitively saying "I cannot breathe".  He keeps pulling at the nonrebreather mask.  We put him on BiPAP and had to hold his hand so he would not pull it off.  Patient is still very agitated and complaining of not being able to breathe and that the machine is not working.  He was given Ativan 0.5 mg IV.  He will also was given Lasix and nitroglycerin because on my exam I felt like he was having acute pulmonary edema.  I did not hear any wheezing.  When I look at his old chart, September 14, 2019 his BUN was 10 and his creatinine was 0.5.  Therefore I gave him 80 mg of Lasix IV.  6:27 AM Dr. Jobe Igo,  radiologist called his chest x-ray report, he feels the patient has a pneumonia and chest x-ray consistent with congestive heart failure or Covid.  6:30 AM patient is resting on the BiPAP.  The Ativan has made him sleepy so he is not fighting the BiPAP anymore.  Recheck at 6:55 AM patient rectal temp was done to make sure he was not septic.  His rectal temp was noted to be low at 95.7.  Bear hugger was applied.  Wife arrived shortly after that was done and states he had his first [place] vaccine in the past 10 days and he is scheduled to have his second 1.  When I listen to the patient now he is laying on his side and he is tolerating the BiPAP.  He has improved air movement diffusely and he now only has rales at the bases.  He has not had a diuresis from the Lasix yet.  7:07 AM Dr. Roxan Hockey, hospitalist will admit.  Final Clinical Impression(s) / ED Diagnoses Final diagnoses:  Acute congestive heart failure, unspecified heart failure type ([place])  Hypoxia  Renal insufficiency    Rx / DC Orders  Plan admission  Rolland Porter, MD,  Barbette Or, MD 01/07/20 [phone]

## 2020-01-07 NOTE — ED Triage Notes (Signed)
Pt C/O sudden onset of SOB upon waking. Initial O2 70% on RA.

## 2020-01-08 ENCOUNTER — Inpatient Hospital Stay (HOSPITAL_COMMUNITY): Payer: Medicare Other

## 2020-01-08 ENCOUNTER — Encounter (HOSPITAL_COMMUNITY): Payer: Self-pay | Admitting: Family Medicine

## 2020-01-08 DIAGNOSIS — I5043 Acute on chronic combined systolic (congestive) and diastolic (congestive) heart failure: Principal | ICD-10-CM

## 2020-01-08 DIAGNOSIS — Z515 Encounter for palliative care: Secondary | ICD-10-CM

## 2020-01-08 DIAGNOSIS — J9601 Acute respiratory failure with hypoxia: Secondary | ICD-10-CM

## 2020-01-08 DIAGNOSIS — I5023 Acute on chronic systolic (congestive) heart failure: Secondary | ICD-10-CM | POA: Diagnosis present

## 2020-01-08 DIAGNOSIS — Z7189 Other specified counseling: Secondary | ICD-10-CM

## 2020-01-08 LAB — CBC
HCT: 33.4 % — ABNORMAL LOW (ref 39.0–52.0)
Hemoglobin: 10.8 g/dL — ABNORMAL LOW (ref 13.0–17.0)
MCH: 28.8 pg (ref 26.0–34.0)
MCHC: 32.3 g/dL (ref 30.0–36.0)
MCV: 89.1 fL (ref 80.0–100.0)
Platelets: 179 10*3/uL (ref 150–400)
RBC: 3.75 MIL/uL — ABNORMAL LOW (ref 4.22–5.81)
RDW: 15.2 % (ref 11.5–15.5)
WBC: 8.3 10*3/uL (ref 4.0–10.5)
nRBC: 0 % (ref 0.0–0.2)

## 2020-01-08 LAB — PATHOLOGIST SMEAR REVIEW

## 2020-01-08 LAB — BASIC METABOLIC PANEL
Anion gap: 13 (ref 5–15)
BUN: 29 mg/dL — ABNORMAL HIGH (ref 8–23)
CO2: 23 mmol/L (ref 22–32)
Calcium: 8.5 mg/dL — ABNORMAL LOW (ref 8.9–10.3)
Chloride: 103 mmol/L (ref 98–111)
Creatinine, Ser: 2.03 mg/dL — ABNORMAL HIGH (ref 0.61–1.24)
GFR calc Af Amer: 36 mL/min — ABNORMAL LOW (ref >60)
GFR calc non Af Amer: 31 mL/min — ABNORMAL LOW (ref >60)
Glucose, Bld: 132 mg/dL — ABNORMAL HIGH (ref 70–99)
Potassium: 3.9 mmol/L (ref 3.5–5.1)
Sodium: 139 mmol/L (ref 135–145)

## 2020-01-08 LAB — MRSA PCR SCREENING: MRSA by PCR: NEGATIVE

## 2020-01-08 MED ORDER — DOCUSATE SODIUM 100 MG PO CAPS
100.0000 mg | ORAL_CAPSULE | Freq: Two times a day (BID) | ORAL | Status: DC
  Administered 2020-01-08 – 2020-01-09 (×3): 100 mg via ORAL
  Filled 2020-01-08 (×3): qty 1

## 2020-01-08 MED ORDER — METHYLPREDNISOLONE SODIUM SUCC 40 MG IJ SOLR
40.0000 mg | Freq: Two times a day (BID) | INTRAMUSCULAR | Status: DC
  Administered 2020-01-09: 40 mg via INTRAVENOUS
  Filled 2020-01-08: qty 1

## 2020-01-08 MED ORDER — FUROSEMIDE 10 MG/ML IJ SOLN
60.0000 mg | Freq: Every day | INTRAMUSCULAR | Status: DC
  Administered 2020-01-09: 60 mg via INTRAVENOUS
  Filled 2020-01-08: qty 6

## 2020-01-08 NOTE — Progress Notes (Signed)
Patient to be transferred to a telemetry bed and in stable condition. Patient and sister, [name] made aware of transfer and agreeable. Report given to accepting RN, Apolonio Schneiders. Patient transferred by staff to room 316.  Celestia Khat, RN

## 2020-01-08 NOTE — Consult Note (Signed)
Consultation Note Date: 01/08/2020   Patient Name: [name]  DOB: [birth year]  MRN: [id number]  Age / Sex: 74 y.o., male  PCP: Celene Squibb, MD Referring Physician: Roxan Hockey, MD  Reason for Consultation: Establishing goals of care and Psychosocial/spiritual support  HPI/Patient Profile: 73 y.o. male  with past medical history of arteriosclerotic cardiovascular disease MI 0383, systolic heart failure with last EF 10 to 15%, CKD 3, cirrhosis with history of excessive alcohol use, GERD, HLD, tobacco use current smoker pack per day, EGD 2018 and 2020 admitted on 01/07/2020 with acute hypoxic respiratory failure secondary to systolic heart failure exacerbation, CKD 4.   Clinical Assessment and Goals of Care: I have reviewed medical records including EPIC notes, labs and imaging, received report from bedside nursing staff, examined the patient and met at bedside with sister Vira Agar to discuss diagnosis prognosis, [place], EOL wishes, disposition and options.  Mr. Haug is quite frail and thin.  He is alert and oriented x3, able to make his basic needs known.  He will make an somewhat keep eye contact.  We talked for a few minutes and then his older sister, Vira Agar, arrives.  I introduced Palliative Medicine as specialized medical care for people living with serious illness. It focuses on providing relief from the symptoms and stress of a serious illness.   We discussed a brief life review of the patient.  Mr. Skeens is a widower of about 5 years.  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  He has 1 adult daughter, Vaughan Basta who lives in his home.  His sister Blanch Media and his sister Hulda Marin who is bedbound, also live in the family home.   As far as functional and nutritional status, although he looks quite frail and thin,  family states he is put on 4 to 5 pounds in the last 1 month.  We discussed current illness and what it means in the larger context of on-going co-morbidities.  Natural disease trajectory and expectations at EOL were discussed.  We talked in detail about heart failure and heart disease.  We talked in detail about normal EF of 65% and his EF is 10 to 15%.  I shared that this qualifies him for at home "treat the treatable" hospice care.  His Sister Vira Agar shares that she believes hospice care is a good idea, but would like for him to have home health first if he qualifies.  I attempted to elicit values and goals of care important to the patient.  Mr. Rondon states that it is important for him that he return to his own home, he would never want to live in a nursing home.  The difference between aggressive medical intervention and comfort care was considered in light of the patient's goals of care.   Advanced directives, concepts specific to code status, artifical feeding and hydration, and rehospitalization were considered and discussed.  We talked in detail about "treat the  treatable, but allowing natural death".  We talked about the realities of CPR and life support.  Mr. Ellenwood and his sister agree for DNR status  Hospice and Palliative Care services outpatient were explained and offered, and although they feel this is a good idea, they want to wait for hospice services.  Questions and concerns were addressed.  The family was encouraged to call with questions or concerns.   Conference with attending, bedside nursing staff, transition of care team related to patient condition, needs, goals of care, disposition.  HCPOA   NEXT OF KIN -Mr. Bowen is a widower, he has 1 child, Vaughan Basta, and 3 sisters.   He names his daughter Vaughan Basta and his sister Blanch Media as his Warehouse manager.   SUMMARY OF RECOMMENDATIONS   Continue to treat the treatable but no CPR or intubation Home with home health  of qualified, declines SNF rehab Agreeable to hospice care in the future after home health  Code Status/Advance Care Planning:  DNR - discussed with patient and sister, Vira Agar.  Both agree that "treat the treatable", but allow a natural death is right for Earnest.   Symptom Management:   Added bowel regimen/colace  Palliative Prophylaxis:   Bowel Regimen and Oral Care  Additional Recommendations (Limitations, Scope, Preferences):  Treat the treatable but no CPR or intubation.  Psycho-social/Spiritual:   Desire for further Chaplaincy support:no  Additional Recommendations: Caregiving  Support/Resources and Education on Hospice  Prognosis:   Unable to determine, based on outcomes.  6 months or less would not be surprising based on decreasing functional status, EF of 10 to 15%, chronic illness burden.  Discharge Planning: Anticipate home with home health and later hospice      Primary Diagnoses: Present on Admission: . Respiratory failure with hypoxia ([place]) . Coronary artery disease . Tobacco abuse   I have reviewed the medical record, interviewed the patient and family, and examined the patient. The following aspects are pertinent.  Past Medical History:  Diagnosis Date  . Arteriosclerotic cardiovascular disease (ASCVD)    IMI in 5/07 with CTO mid cx, 60% LAD, 99% RCA-> BMS; mod. impaired LV function; EF:35-40% 2/10  . Chronic kidney disease    STAGE 3-4-creatinine 2.5 in 2009; 1.7 in 10/2008  . Cirrhosis ([place])    History of excessive alcohol use; continuing social use  . Gastroesophageal reflux disease   . Hyperlipidemia   . Tobacco abuse    60 pack years; one pack per day   Social History   Socioeconomic History  . Marital status: Single    Spouse name: Not on file  . Number of children: Not on file  . Years of education: Not on file  . Highest education level: Not on file  Occupational History  . Occupation: Retired  Tobacco Use  . Smoking status:  Current Every Day Smoker    Packs/day: 1.00    Years: 60.00    Pack years: 60.00  . Smokeless tobacco: Never Used  Substance and Sexual Activity  . Alcohol use: Yes    Comment: per sister "he had one beer last week"  . Drug use: No  . Sexual activity: Not on file  Other Topics Concern  . Not on file  Social History Narrative   Resides in supervised living situation   No regular exercise   Social Determinants of Health   Financial Resource Strain:   . Difficulty of Paying Living Expenses:   Food Insecurity:   . Worried About [employer] of  Food in the Last Year:   . [place] in the Last Year:   Transportation Needs:   . Film/video editor (Medical):   [name] Lack of Transportation (Non-Medical):   Physical Activity:   . Days of Exercise per Week:   . Minutes of Exercise per Session:   Stress:   . Feeling of Stress :   Social Connections:   . Frequency of Communication with Friends and Family:   . Frequency of Social Gatherings with Friends and Family:   . Attends Religious Services:   . Active Member of Clubs or Organizations:   . Attends Archivist Meetings:   [name] Marital Status:    Family History  Problem Relation Age of Onset  . Other Brother    Scheduled Meds: . allopurinol  100 mg Oral Daily  . atorvastatin  40 mg Oral Daily  . carvedilol  3.125 mg Oral BID WC  . Chlorhexidine Gluconate Cloth  6 each Topical Q0600  . folic acid  1 mg Oral Daily  . furosemide  60 mg Intravenous Q12H  . guaiFENesin  600 mg Oral BID  . heparin  5,000 Units Subcutaneous Q8H  . ipratropium-albuterol  3 mL Nebulization Q6H  . magnesium oxide  400 mg Oral Daily  . mouth rinse  15 mL Mouth Rinse BID  . methylPREDNISolone (SOLU-MEDROL) injection  40 mg Intravenous Q8H  . multivitamin with minerals  1 tablet Oral Daily  . pantoprazole  40 mg Oral Daily  . sodium chloride flush  3 mL Intravenous Q12H  . thiamine  100 mg Oral Daily  . vitamin B-12  50 mcg Oral Daily     Continuous Infusions: . sodium chloride    . azithromycin 500 mg (01/07/20 2135)  . cefTRIAXone (ROCEPHIN)  IV 1 g (01/07/20 2032)   PRN Meds:.sodium chloride, albuterol, ondansetron **OR** ondansetron (ZOFRAN) IV, polyethylene glycol, sodium chloride flush, traZODone Medications Prior to Admission:  Prior to Admission medications   Medication Sig Start Date End Date Taking? Authorizing Provider  allopurinol (ZYLOPRIM) 100 MG tablet Take 100 mg by mouth daily.   Yes [provider]  atorvastatin (LIPITOR) 40 MG tablet TAKE 1 TABLET BY MOUTH ONCE DAILY WITH SUPPER. Patient taking differently: Take 40 mg by mouth daily.  12/30/19  Yes Corum, Rex Kras, MD  carvedilol (COREG) 12.5 MG tablet TAKE 1 TABLET BY MOUTH TWICE DAILY. Patient taking differently: Take 12.5 mg by mouth 2 (two) times daily with a meal.  12/30/19  Yes Corum, Rex Kras, MD  folic acid (FOLVITE) 1 MG tablet TAKE 1 TABLET BY MOUTH ONCE A DAY. Patient taking differently: Take 1 mg by mouth daily.  12/30/19  Yes Corum, Rex Kras, MD  Magnesium Oxide 400 (240 Mg) MG TABS Take 1 tablet by mouth daily. 11/30/19  Yes [provider]  Multiple Vitamin (DAILY-VITE MULTIVITAMIN) TABS Take 1 tablet by mouth daily. 11/30/19  Yes [provider]  nitroGLYCERIN (NITROSTAT) 0.4 MG SL tablet Place 0.4 mg under the tongue every 5 (five) minutes as needed for chest pain.    Yes [provider]  pantoprazole (PROTONIX) 40 MG tablet TAKE 1 TABLET BY MOUTH ONCE DAILY BEFORE BREAKFAST. Patient taking differently: Take 40 mg by mouth daily.  11/30/19  Yes Rehman, Mechele Dawley, MD  thiamine 100 MG tablet Take 1 tablet (100 mg total) by mouth daily. Patient taking differently: Take 50 mg by mouth daily.  12/27/16  Yes Reyne Dumas, MD  No Known Allergies Review of Systems  Unable to perform ROS: Other    Physical Exam Vitals and nursing note reviewed.  Constitutional:      General: He is not in acute distress. HENT:      Head: Normocephalic and atraumatic.  Cardiovascular:     Rate and Rhythm: Normal rate.  Pulmonary:     Effort: Pulmonary effort is normal. No tachypnea or accessory muscle usage.  Abdominal:     Palpations: Abdomen is soft.  Musculoskeletal:     Right lower leg: No edema.     Left lower leg: No edema.     Comments: Thin   Skin:    General: Skin is warm and dry.  Neurological:     General: No focal deficit present.     Mental Status: He is alert and oriented to person, place, and time.  Psychiatric:        Mood and Affect: Mood normal. Mood is not anxious.        Behavior: Behavior normal. Behavior is not agitated.     Vital Signs: BP (!) 112/59   Pulse 74   Temp 97.7 F (36.5 C) (Oral)   Resp (!) 24   Ht '5\' 6"'  (1.676 m)   Wt 52.8 kg   SpO2 96%   BMI 18.79 kg/m  Pain Scale: 0-10   Pain Score: 0-No pain   SpO2: SpO2: 96 % O2 Device:SpO2: 96 % O2 Flow Rate: .O2 Flow Rate (L/min): 3 L/min  IO: Intake/output summary:   Intake/Output Summary (Last 24 hours) at 01/08/2020 1208 Last data filed at 01/08/2020 3557 Gross per 24 hour  Intake 240 ml  Output 2650 ml  Net -2410 ml    LBM: Last BM Date: (PTA) Baseline Weight: Weight: 54.4 kg Most recent weight: Weight: 52.8 kg     Palliative Assessment/Data:   Flowsheet Rows     Most Recent Value  Intake Tab  Referral Department  Hospitalist  Unit at Time of Referral  Intermediate Care Unit  Palliative Care Primary Diagnosis  Cardiac  Date Notified  01/07/20  Palliative Care Type  New Palliative care  Reason for referral  Clarify Goals of Care  Date of Admission  01/07/20  Date first seen by Palliative Care  01/08/20  # of days Palliative referral response time  1 Day(s)  # of days IP prior to Palliative referral  0  Clinical Assessment  Palliative Performance Scale Score  30%  Pain Max last 24 hours  Not able to report  Pain Min Last 24 hours  Not able to report  Dyspnea Max Last 24 Hours  Not able to report    Dyspnea Min Last 24 hours  Not able to report  Psychosocial & Spiritual Assessment  Palliative Care Outcomes      Time In: 0930 Time Out: 1040 Time Total: 70 minutes  Greater than 50%  of this time was spent counseling and coordinating care related to the above assessment and plan.  Signed by: Drue Novel, NP   Please contact Palliative Medicine Team phone at [phone] for questions and concerns.  For individual provider: See Shea Evans

## 2020-01-08 NOTE — Progress Notes (Signed)
Patient Demographics:    [name], is a 74 y.o. male, DOB - February 16, 1946, KCL:[id number]  Admit date - 01/07/2020   Admitting Physician Haylin Camilli Denton Brick, MD  Outpatient Primary MD for the patient is Celene Squibb, MD  LOS - 1   Chief Complaint  Patient presents with  . Shortness of Breath        Subjective:    [name] today has no fevers, no emesis,  No chest pain,   --Cough and shortness of breath is better -Voiding well  Assessment  & Plan :    Principal Problem:   HFrEF/Acute on chronic combined systolic and diastolic CHF (congestive heart failure) (HCC)--EF 10 to 15% April 2021 Active Problems:   Respiratory failure with hypoxia (HCC)   Acute on chronic HFrEF (heart failure with reduced ejection fraction) EF 10 to 15% April 2021   Encounter for hospice care discussion   Coronary artery disease   Chronic kidney disease   Tobacco abuse   Goals of care, counseling/discussion   Palliative care by specialist   DNR (do not resuscitate) discussion  Brief Summary: 74 y.o. male with medical history significant forcoronary artery disease with systolic CHF, cirrhosis, gastric ulcer, CKD IV -presented with EMS with sudden respiratory distress and profound hypoxia with O2 sats in the 70s----admitted 01/07/2020 for acute hypoxic respiratory failure secondary to CHF exacerbation  A/p 1)HFrEF/acute on chronic combined systolic and diastolic dysfunction CHF--- EF on 01/07/2019 2110 to 15%, echo also shows diastolic dysfunction--- on admission patient was profoundly hypoxic requiring continuous BiPAP for over 8 hours or so and aggressive IV diuresis -Hypoxia much improved -Dyspnea at rest improving still has significant dyspnea with minimal exertion -Weight and fluid balance chart not reliable as patient spent considerable amount of time in the ED prior to being moved to stepdown--- -overall  though fluid balance is negative -We will change IV Lasix to 60 mg daily  2)Acute COPD exacerbation in current smoker----patient not interested in smoking cessation -Continue Solu-Medrol along with antibiotics  3)Community-acquired Pneumonia--suspicion of pneumonia on admission, will repeat chest x-ray today after diuresis -Continue Rocephin/azithromycin with bronchodilators and mucolytics at this time  4)Acute Hypoxic Respiratory Failure--- secondary to #1, #2 #3 above --Treat as above  5)CKD 4--- creatinine on admission 1.8 which is close to patient's recent baseline creatinine up to 2.0 due to diuresis  , renally adjust medications, avoid nephrotoxic agents / dehydration  / hypotension  6)CAD--patient remains chest pain-free, troponin noted, continue Lipitor, aspirin, and Coreg  7)social/ethics-patient is DNR/DNI,,  patient with severe multiple comorbidities , palliative care consult appreciated  Disposition/Need for in-Hospital Stay- patient unable to be discharged at this time due to --- CHF flareup with hypoxia requiring IV diuresis* -Patient From: home D/C Place: home with [facility] Barriers: Not Clinically Stable- --- hypoxic respiratory failure requiring further IV diuresis  Code Status : DNR  Family Communication:   NA (patient is alert, awake and coherent)  Consults  :  Palliative care  DVT Prophylaxis  :   - Heparin - SCDs   Lab Results  Component Value Date   PLT 179 01/08/2020    Inpatient Medications  Scheduled Meds: . allopurinol  100 mg Oral Daily  . atorvastatin  40 mg Oral  Daily  . carvedilol  3.125 mg Oral BID WC  . Chlorhexidine Gluconate Cloth  6 each Topical Q0600  . docusate sodium  100 mg Oral BID  . folic acid  1 mg Oral Daily  . [START ON 01/09/2020] furosemide  60 mg Intravenous Daily  . guaiFENesin  600 mg Oral BID  . heparin  5,000 Units Subcutaneous Q8H  . ipratropium-albuterol  3 mL Nebulization Q6H  . magnesium oxide  400 mg Oral Daily  .  mouth rinse  15 mL Mouth Rinse BID  . methylPREDNISolone (SOLU-MEDROL) injection  40 mg Intravenous Q12H  . multivitamin with minerals  1 tablet Oral Daily  . pantoprazole  40 mg Oral Daily  . sodium chloride flush  3 mL Intravenous Q12H  . thiamine  100 mg Oral Daily  . vitamin B-12  50 mcg Oral Daily   Continuous Infusions: . sodium chloride    . azithromycin 500 mg (01/07/20 2135)  . cefTRIAXone (ROCEPHIN)  IV 1 g (01/07/20 2032)   PRN Meds:.sodium chloride, albuterol, ondansetron **OR** ondansetron (ZOFRAN) IV, polyethylene glycol, sodium chloride flush, traZODone    Anti-infectives (From admission, onward)   Start     Dose/Rate Route Frequency Ordered Stop   01/07/20 2000  azithromycin (ZITHROMAX) 500 mg in sodium chloride 0.9 % 250 mL IVPB     500 mg 250 mL/hr over 60 Minutes Intravenous Every 24 hours 01/07/20 1926     01/07/20 1930  cefTRIAXone (ROCEPHIN) 1 g in sodium chloride 0.9 % 100 mL IVPB     1 g 200 mL/hr over 30 Minutes Intravenous Every 24 hours 01/07/20 1926     01/07/20 0645  cefTRIAXone (ROCEPHIN) 1 g in sodium chloride 0.9 % 100 mL IVPB     1 g 200 mL/hr over 30 Minutes Intravenous  Once 01/07/20 0642 01/07/20 0819   01/07/20 0645  azithromycin (ZITHROMAX) 500 mg in sodium chloride 0.9 % 250 mL IVPB     500 mg 250 mL/hr over 60 Minutes Intravenous  Once 01/07/20 0642 01/07/20 0940        Objective:   Vitals:   01/08/20 1000 01/08/20 1139 01/08/20 1314 01/08/20 1449  BP: (!) 112/59  (!) 109/58   Pulse: 74  62   Resp: (!) 24  (!) 22   Temp:  97.7 F (36.5 C)    TempSrc:  Oral    SpO2: 96%  97% 96%  Weight:      Height:        Wt Readings from Last 3 Encounters:  01/08/20 52.8 kg  09/14/19 50 kg  08/21/19 61.7 kg     Intake/Output Summary (Last 24 hours) at 01/08/2020 1535 Last data filed at 01/08/2020 7106 Gross per 24 hour  Intake 240 ml  Output 950 ml  Net -710 ml   Physical Exam  Gen:- Awake Alert, able to speak in short  sentences HEENT:- [place].AT, No sclera icterus Nose- 3L/min Neck-Supple Neck,No JVD,.  Lungs-diminished breath sounds with faint bibasilar rales  CV- S1, S2 normal, regular  Abd-  +ve B.Sounds, Abd Soft, No tenderness,    Extremity/Skin:-1+  Pitting edema, pedal pulses present  Psych-affect is appropriate, oriented x3 Neuro-generalized weakness no new focal deficits, no tremors   Data Review:   Micro Results Recent Results (from the past 240 hour(s))  Respiratory Panel by RT PCR (Flu A&B, Covid) - Nasopharyngeal Swab     Status: None   Collection Time: 01/07/20  5:59 AM   Specimen: Nasopharyngeal  Swab  Result Value Ref Range Status   SARS Coronavirus 2 by RT PCR NEGATIVE NEGATIVE Final    Comment: (NOTE) SARS-CoV-2 target nucleic acids are NOT DETECTED. The SARS-CoV-2 RNA is generally detectable in upper respiratoy specimens during the acute phase of infection. The lowest concentration of SARS-CoV-2 viral copies this assay can detect is 131 copies/mL. A negative result does not preclude SARS-Cov-2 infection and should not be used as the sole basis for treatment or other patient management decisions. A negative result may occur with  improper specimen collection/handling, submission of specimen other than nasopharyngeal swab, presence of viral mutation(s) within the areas targeted by this assay, and inadequate number of viral copies (<131 copies/mL). A negative result must be combined with clinical observations, patient history, and epidemiological information. The expected result is Negative. Fact Sheet for Patients:  PinkCheek.be Fact Sheet for Healthcare Providers:  GravelBags.it This test is not yet ap proved or cleared by the [place] FDA and  has been authorized for detection and/or diagnosis of SARS-CoV-2 by FDA under an Emergency Use Authorization (EUA). This EUA will remain  in effect (meaning this test can be  used) for the duration of the COVID-19 declaration under Section 564(b)(1) of the Act, 21 U.S.C. section 360bbb-3(b)(1), unless the authorization is terminated or revoked sooner.    Influenza A by PCR NEGATIVE NEGATIVE Final   Influenza B by PCR NEGATIVE NEGATIVE Final    Comment: (NOTE) The Xpert Xpress SARS-CoV-2/FLU/RSV assay is intended as an aid in  the diagnosis of influenza from Nasopharyngeal swab specimens and  should not be used as a sole basis for treatment. Nasal washings and  aspirates are unacceptable for Xpert Xpress SARS-CoV-2/FLU/RSV  testing. Fact Sheet for Patients: PinkCheek.be Fact Sheet for Healthcare Providers: GravelBags.it This test is not yet approved or cleared by the [place] FDA and  has been authorized for detection and/or diagnosis of SARS-CoV-2 by  FDA under an Emergency Use Authorization (EUA). This EUA will remain  in effect (meaning this test can be used) for the duration of the  Covid-19 declaration under Section 564(b)(1) of the Act, 21  U.S.C. section 360bbb-3(b)(1), unless the authorization is  terminated or revoked. Performed at [facility], [address] [place], [place] [address]   Culture, blood (routine x 2)     Status: None (Preliminary result)   Collection Time: 01/07/20  7:27 AM   Specimen: BLOOD RIGHT ARM  Result Value Ref Range Status   Specimen Description BLOOD RIGHT ARM  Final   Special Requests   Final    BOTTLES DRAWN AEROBIC AND ANAEROBIC Blood Culture adequate volume   Culture   Final    NO GROWTH < 24 HOURS Performed at [facility] [address], [place], [place] [address]    Report Status PENDING  Incomplete  Culture, blood (routine x 2)     Status: None (Preliminary result)   Collection Time: 01/07/20  7:38 AM   Specimen: BLOOD LEFT ARM  Result Value Ref Range Status   Specimen Description BLOOD LEFT ARM  Final   Special Requests   Final    BOTTLES  DRAWN AEROBIC AND ANAEROBIC Blood Culture adequate volume   Culture   Final    NO GROWTH < 24 HOURS Performed at [facility], [address], [place],  [address]    Report Status PENDING  Incomplete  MRSA PCR Screening     Status: None   Collection Time: 01/07/20  4:27 PM  Specimen: Nasal Mucosa; Nasopharyngeal  Result Value Ref Range Status   MRSA by PCR NEGATIVE NEGATIVE Final    Comment:        The GeneXpert MRSA Assay (FDA approved for NASAL specimens only), is one component of a comprehensive MRSA colonization surveillance program. It is not intended to diagnose MRSA infection nor to guide or monitor treatment for MRSA infections. Performed at [facility], [address], [place], [place] [address]     Radiology Reports DG Chest [place] 1 View  Result Date: 01/07/2020 CLINICAL DATA:  Acute onset shortness of breath.  Hypoxia. EXAM: PORTABLE CHEST 1 VIEW COMPARISON:  One-view chest x-ray 12/06/2018 FINDINGS: Heart size is exaggerated by low lung volumes. Atherosclerotic calcifications are present at the arch. Mild diffuse edema is present. Right greater than left lower lobe airspace disease is present. Right effusion is noted. IMPRESSION: 1. Dense right lower lobe airspace disease compatible with pneumonia. 2. Slightly less prominent left lower lobe airspace disease is also concerning for infection. 3. Mild diffuse edema. 4. Right pleural effusion. 5. Atherosclerosis. Electronically Signed   By: San Morelle M.D.   On: 01/07/2020 06:28   ECHOCARDIOGRAM COMPLETE  Result Date: 01/07/2020    ECHOCARDIOGRAM REPORT   Patient Name:   KINGSLY KLOEPFER Date of Exam: 01/07/2020 Medical Rec #:  [id number]          Height:       66.0 in Accession #:    [id number]         Weight:       120.0 lb Date of Birth:  Dec 16, 1945          BSA:          1.610 m Patient Age:    22 years           BP:           150/74 mmHg Patient Gender: M                  HR:           86 bpm. Exam  Location:  [facility] Procedure: 2D Echo, Cardiac Doppler and Color Doppler Indications:    Dyspnea 786.09 / R06.00  History:        Patient has prior history of Echocardiogram examinations, most                 recent 12/19/2016. CHF, CAD; Risk Factors:Dyslipidemia. Alcohol                 dependence,Acute renal failure (ARF),Cirrhosis,Chronic systolic                 congestive heart failure,Tobacco abuse.  Sonographer:    Alvino Chapel RCS Referring Phys: [phone] Merlyn Conley IMPRESSIONS  1. Left ventricular ejection fraction, by estimation, is 10-15%. The left ventricle has severely decreased function. The left ventricle demonstrates global hypokinesis. The left ventricular internal cavity size was mildly dilated. Left ventricular diastolic parameters are consistent with Grade I diastolic dysfunction (impaired relaxation). Elevated left ventricular end-diastolic pressure.  2. Right ventricular systolic function is mildly reduced. The right ventricular size is normal. There is mildly elevated pulmonary artery systolic pressure.  3. Left atrial size was severely dilated.  4. The mitral valve is grossly normal. Mild mitral valve regurgitation.  5. The aortic valve is tricuspid. Aortic valve regurgitation is trivial. No aortic stenosis is present.  6. The inferior vena cava is dilated in size with <50% respiratory variability, suggesting  right atrial pressure of 15 mmHg. FINDINGS  Left Ventricle: Left ventricular ejection fraction, by estimation, is 10-15%. The left ventricle has severely decreased function. The left ventricle demonstrates global hypokinesis. The left ventricular internal cavity size was mildly dilated. There is no left ventricular hypertrophy. Left ventricular diastolic parameters are consistent with Grade I diastolic dysfunction (impaired relaxation). Elevated left ventricular end-diastolic pressure.  LV Wall Scoring: The anterior septum is akinetic. Right Ventricle: The right ventricular size is  normal. No increase in right ventricular wall thickness. Right ventricular systolic function is mildly reduced. There is mildly elevated pulmonary artery systolic pressure. The tricuspid regurgitant velocity  is 2.45 m/s, and with an assumed right atrial pressure of 15 mmHg, the estimated right ventricular systolic pressure is 16.1 mmHg. Left Atrium: Left atrial size was severely dilated. Right Atrium: Right atrial size was normal in size. Pericardium: There is no evidence of pericardial effusion. Mitral Valve: The mitral valve is grossly normal. Mild mitral valve regurgitation. Tricuspid Valve: The tricuspid valve is grossly normal. Tricuspid valve regurgitation is mild. Aortic Valve: The aortic valve is tricuspid. Aortic valve regurgitation is trivial. No aortic stenosis is present. Pulmonic Valve: The pulmonic valve was not well visualized. Pulmonic valve regurgitation is not visualized. Aorta: The aortic root is normal in size and structure. Venous: The inferior vena cava is dilated in size with less than 50% respiratory variability, suggesting right atrial pressure of 15 mmHg. IAS/Shunts: No atrial level shunt detected by color flow Doppler.  LEFT VENTRICLE PLAX 2D LVIDd:         5.39 cm      Diastology LVIDs:         5.24 cm      LV e' lateral:   7.07 cm/s LV PW:         0.92 cm      LV E/e' lateral: 12.6 LV IVS:        0.88 cm      LV e' medial:    3.92 cm/s LVOT diam:     2.00 cm      LV E/e' medial:  22.7 LV SV:         39 LV SV Index:   24 LVOT Area:     3.14 cm  LV Volumes (MOD) LV vol d, MOD A2C: 130.0 ml LV vol d, MOD A4C: 136.0 ml LV vol s, MOD A2C: 118.0 ml LV vol s, MOD A4C: 106.0 ml LV SV MOD A2C:     12.0 ml LV SV MOD A4C:     136.0 ml LV SV MOD BP:      18.1 ml RIGHT VENTRICLE RV S prime:     15.10 cm/s TAPSE (M-mode): 1.4 cm LEFT ATRIUM             Index LA diam:        3.20 cm 1.99 cm/m LA Vol (A2C):   65.0 ml 40.38 ml/m LA Vol (A4C):   66.5 ml 41.32 ml/m LA Biplane Vol: 67.4 ml 41.88 ml/m   AORTIC VALVE LVOT Vmax:   64.30 cm/s LVOT Vmean:  39.400 cm/s LVOT VTI:    0.124 m MITRAL VALVE               TRICUSPID VALVE MV Area (PHT): 5.32 cm    TR Peak grad:   24.0 mmHg MV Decel Time: 143 msec    TR Vmax:        245.00 cm/s MV E velocity: 89.10 cm/s MV A  velocity: 89.80 cm/s  SHUNTS MV E/A ratio:  0.99        Systemic VTI:  0.12 m                            Systemic Diam: 2.00 cm Kate Sable MD Electronically signed by Kate Sable MD Signature Date/Time: 01/07/2020/2:47:16 PM    Final      CBC Recent Labs  Lab 01/07/20 0602 01/08/20 0504  WBC 7.7 8.3  HGB 11.1* 10.8*  HCT 36.3* 33.4*  PLT 207 179  MCV 95.8 89.1  MCH 29.3 28.8  MCHC 30.6 32.3  RDW 15.6* 15.2  LYMPHSABS 5.2*  --   MONOABS 0.2  --   EOSABS 0.5  --   BASOSABS 0.1  --     Chemistries  Recent Labs  Lab 01/07/20 0602 01/08/20 0504  NA 138 139  K 4.5 3.9  CL 104 103  CO2 20* 23  GLUCOSE 234* 132*  BUN 16 29*  CREATININE 1.82* 2.03*  CALCIUM 8.6* 8.5*  AST 32  --   ALT 19  --   ALKPHOS 67  --   BILITOT 0.5  --    ------------------------------------------------------------------------------------------------------------------ No results for input(s): CHOL, HDL, LDLCALC, TRIG, CHOLHDL, LDLDIRECT in the last 72 hours.  Lab Results  Component Value Date   HGBA1C 5.9 (H) 12/13/2016   ------------------------------------------------------------------------------------------------------------------ No results for input(s): TSH, T4TOTAL, T3FREE, THYROIDAB in the last 72 hours.  Invalid input(s): FREET3 ------------------------------------------------------------------------------------------------------------------ No results for input(s): VITAMINB12, FOLATE, FERRITIN, TIBC, IRON, RETICCTPCT in the last 72 hours.  Coagulation profile No results for input(s): INR, PROTIME in the last 168 hours.  No results for input(s): DDIMER in the last 72 hours.  Cardiac Enzymes No results for  input(s): CKMB, TROPONINI, MYOGLOBIN in the last 168 hours.  Invalid input(s): CK ------------------------------------------------------------------------------------------------------------------    Component Value Date/Time   BNP 1,603.0 (H) 01/07/2020 3903   Roxan Hockey M.D on 01/08/2020 at 3:35 PM  Go to [email or web address] - for contact info  [facility] - Office  [phone]

## 2020-01-09 LAB — BASIC METABOLIC PANEL
Anion gap: 10 (ref 5–15)
BUN: 43 mg/dL — ABNORMAL HIGH (ref 8–23)
CO2: 23 mmol/L (ref 22–32)
Calcium: 8.2 mg/dL — ABNORMAL LOW (ref 8.9–10.3)
Chloride: 103 mmol/L (ref 98–111)
Creatinine, Ser: 1.83 mg/dL — ABNORMAL HIGH (ref 0.61–1.24)
GFR calc Af Amer: 41 mL/min — ABNORMAL LOW (ref >60)
GFR calc non Af Amer: 35 mL/min — ABNORMAL LOW (ref >60)
Glucose, Bld: 114 mg/dL — ABNORMAL HIGH (ref 70–99)
Potassium: 3.7 mmol/L (ref 3.5–5.1)
Sodium: 136 mmol/L (ref 135–145)

## 2020-01-09 MED ORDER — ISOSORBIDE DINITRATE 10 MG PO TABS: 10.0000 mg | ORAL_TABLET | Freq: Two times a day (BID) | ORAL | 5 refills | Status: DC

## 2020-01-09 MED ORDER — ATORVASTATIN CALCIUM 40 MG PO TABS: ORAL_TABLET | ORAL | 3 refills | Status: DC

## 2020-01-09 MED ORDER — VITAMIN B-12 50 MCG PO TABS: 50.0000 ug | ORAL_TABLET | Freq: Every day | ORAL | 3 refills | Status: DC

## 2020-01-09 MED ORDER — FUROSEMIDE 40 MG PO TABS
40.0000 mg | ORAL_TABLET | Freq: Every day | ORAL | 11 refills | Status: DC
Start: 2020-01-09 — End: 2021-12-14

## 2020-01-09 MED ORDER — IPRATROPIUM-ALBUTEROL 0.5-2.5 (3) MG/3ML IN SOLN
3.0000 mL | Freq: Four times a day (QID) | RESPIRATORY_TRACT | Status: DC
  Administered 2020-01-09: 3 mL via RESPIRATORY_TRACT
  Filled 2020-01-09: qty 3

## 2020-01-09 MED ORDER — GUAIFENESIN ER 600 MG PO TB12: 600.0000 mg | ORAL_TABLET | Freq: Two times a day (BID) | ORAL | 0 refills | Status: DC

## 2020-01-09 MED ORDER — CARVEDILOL 6.25 MG PO TABS: 6.2500 mg | ORAL_TABLET | Freq: Two times a day (BID) | ORAL | 3 refills | Status: DC

## 2020-01-09 MED ORDER — SPIRIVA HANDIHALER 18 MCG IN CAPS
18.0000 ug | ORAL_CAPSULE | Freq: Every day | RESPIRATORY_TRACT | 5 refills | Status: DC
Start: 2020-01-09 — End: 2021-12-14

## 2020-01-09 MED ORDER — PREDNISONE 20 MG PO TABS
50.0000 mg | ORAL_TABLET | Freq: Once | ORAL | Status: AC
  Administered 2020-01-09: 50 mg via ORAL
  Filled 2020-01-09: qty 1

## 2020-01-09 MED ORDER — ALBUTEROL SULFATE HFA 108 (90 BASE) MCG/ACT IN AERS
2.0000 | INHALATION_SPRAY | RESPIRATORY_TRACT | 2 refills | Status: DC | PRN
Start: 2020-01-09 — End: 2021-04-13

## 2020-01-09 MED ORDER — MULTI-VITAMIN/MINERALS PO TABS: 1.0000 | ORAL_TABLET | Freq: Every day | ORAL | 3 refills | Status: AC

## 2020-01-09 MED ORDER — FOLIC ACID 1 MG PO TABS: 1.0000 mg | ORAL_TABLET | Freq: Every day | ORAL | 3 refills | Status: AC

## 2020-01-09 MED ORDER — HYDRALAZINE HCL 10 MG PO TABS
10.0000 mg | ORAL_TABLET | Freq: Two times a day (BID) | ORAL | 5 refills | Status: DC
Start: 2020-01-09 — End: 2021-12-14

## 2020-01-09 MED ORDER — DOXYCYCLINE HYCLATE 100 MG PO TABS: 100.0000 mg | ORAL_TABLET | Freq: Two times a day (BID) | ORAL | 0 refills | Status: AC

## 2020-01-09 MED ORDER — MAGNESIUM OXIDE -MG SUPPLEMENT 400 (240 MG) MG PO TABS: 1.0000 | ORAL_TABLET | Freq: Every day | ORAL | 3 refills | Status: AC

## 2020-01-09 MED ORDER — POTASSIUM CHLORIDE CRYS ER 10 MEQ PO TBCR: 10.0000 meq | EXTENDED_RELEASE_TABLET | Freq: Every day | ORAL | 3 refills | Status: DC

## 2020-01-09 MED ORDER — SPIRONOLACTONE 25 MG PO TABS
12.5000 mg | ORAL_TABLET | Freq: Every day | ORAL | 5 refills | Status: DC
Start: 2020-01-09 — End: 2021-12-14

## 2020-01-09 MED ORDER — CEFDINIR 300 MG PO CAPS: 300.0000 mg | ORAL_CAPSULE | Freq: Two times a day (BID) | ORAL | 0 refills | Status: AC

## 2020-01-09 NOTE — Progress Notes (Signed)
Order for midline IV. Pt with adequate peripheral IV access and possible discharge today per Dr Denton Brick. Order discontinued at this time.

## 2020-01-09 NOTE — Discharge Instructions (Signed)
1)Very low-salt diet advised 2)Weigh yourself daily, call if you gain more than 3 pounds in 1 day or more than 5 pounds in 1 week as your diuretic medications may need to be adjusted 3)Limit your Fluid  intake to no more than 60 ounces (1.8 Liters) per day 4) please note that there has been several changes to your medications--- 5) repeat CBC and BMP blood test with the primary care physician in about a week advised 6)Smoking  cessation strongly advised

## 2020-01-09 NOTE — TOC Transition Note (Signed)
Transition of Care [facility]) - CM/SW Discharge Note  Patient Details  Name: [name] MRN: [id number] Date of Birth: 1946/07/27  [facility] of [facility]) CM/SW Contact:  Sherie Don, LCSW Phone Number: 01/09/2020, 1:23 PM  Clinical Narrative: TOC notified patient will need bedside commode and rolling walker for patient as well as HHPT. CSW made referral to [facility] with [employer]. [employer] accepted referral and Juliann Pulse informed CSW the DME will be shipped to patient's home. CSW called [facility] to make [facility] referral as patient is agreeable to [facility] before possibly pursuing home hospice services. [facility] accepted referral. CSW called patient and patient's sister, [name], to inform them of DME and HH. Patient and sister agreeable. TOC signing off.  Final next level of care: [place] Barriers to Discharge: Barriers Resolved  Patient Goals and CMS Choice Patient states their goals for this hospitalization and ongoing recovery are:: Discharge home CMS Medicare.gov Compare Post Acute Care list provided to:: Patient Choice offered to / list presented to : Patient  Discharge Plan and Services         DME Arranged: Bedside commode, Walker rolling DME Agency: [employer] Date DME Agency Contacted: 01/09/20 Time DME Agency Contacted: [phone] Representative spoke with at DME Agency: [place]: PT [place]: [place] Date [place]: 01/09/20 Time [place]: 1316 Representative spoke with at [place]: [place]  Readmission Risk Interventions Readmission Risk Prevention Plan 01/09/2020 06/11/2019  Transportation Screening Complete Complete  PCP or Specialist Appt within 3-5 Days Not Complete Complete  Not Complete comments Patient discharging on weekend -  [place] or [place] Complete Complete  Social Work Consult for [place] Planning/Counseling Complete Not Complete  Palliative Care Screening Complete -   Medication Review Press photographer) Complete Complete  Some recent data might be hidden

## 2020-01-09 NOTE — Progress Notes (Signed)
Discharge instructions given to both pt and sister. Both verbalized understanding. Pt discharged in stable condition via wheelchair to awaiting vehicle.

## 2020-01-09 NOTE — Discharge Summary (Signed)
[name], is a 74 y.o. male  DOB 1945/12/02  MRN [id number].  Admission date:  01/07/2020  Admitting Physician  Roxan Hockey, MD  Discharge Date:  01/09/2020   Primary MD  Celene Squibb, MD  Recommendations for primary care physician for things to follow:   1)Very low-salt diet advised 2)Weigh yourself daily, call if you gain more than 3 pounds in 1 day or more than 5 pounds in 1 week as your diuretic medications may need to be adjusted 3)Limit your Fluid  intake to no more than 60 ounces (1.8 Liters) per day 4) please note that there has been several changes to your medications--- 5) repeat CBC and BMP blood test with the primary care physician in about a week advised 6)Smoking  cessation strongly advised  Admission Diagnosis  Renal insufficiency [N28.9] Hypoxia [R09.02] Respiratory failure with hypoxia ([place]) [J96.91] Acute congestive heart failure, unspecified heart failure type [facility]) [I50.9]   Discharge Diagnosis  Renal insufficiency [N28.9] Hypoxia [R09.02] Respiratory failure with hypoxia ([place]) [J96.91] Acute congestive heart failure, unspecified heart failure type ([place]) [I50.9]    Principal Problem:   HFrEF/Acute on chronic combined systolic and diastolic CHF (congestive heart failure) (HCC)--EF 10 to 15% April 2021 Active Problems:   Respiratory failure with hypoxia ([place])   Acute on chronic HFrEF (heart failure with reduced ejection fraction) EF 10 to 15% April 2021   Encounter for hospice care discussion   Coronary artery disease   Chronic kidney disease   Tobacco abuse   Goals of care, counseling/discussion   Palliative care by specialist   DNR (do not resuscitate) discussion      Past Medical History:  Diagnosis Date  . Arteriosclerotic cardiovascular disease (ASCVD)    IMI in 5/07 with CTO mid cx, 60% LAD, 99% RCA-> BMS; mod. impaired LV function; EF:35-40% 2/10  .  Chronic kidney disease    STAGE 3-4-creatinine 2.5 in 2009; 1.7 in 10/2008  . Cirrhosis ([place])    History of excessive alcohol use; continuing social use  . Gastroesophageal reflux disease   . Hyperlipidemia   . Tobacco abuse    60 pack years; one pack per day    Past Surgical History:  Procedure Laterality Date  . BIOPSY  12/05/2018   Procedure: BIOPSY;  Surgeon: Rogene Houston, MD;  [facility];  Service: Endoscopy;;  gastric  . ESOPHAGOGASTRODUODENOSCOPY N/A 12/19/2016   Procedure: ESOPHAGOGASTRODUODENOSCOPY (EGD);  Surgeon: Rogene Houston, MD;  [facility];  Service: Endoscopy;  Laterality: N/A;  . ESOPHAGOGASTRODUODENOSCOPY N/A 12/21/2016   Procedure: ESOPHAGOGASTRODUODENOSCOPY (EGD);  Surgeon: Danie Binder, MD;  [facility];  Service: Endoscopy;  Laterality: N/A;  . ESOPHAGOGASTRODUODENOSCOPY (EGD) WITH PROPOFOL N/A 12/22/2016   Procedure: ESOPHAGOGASTRODUODENOSCOPY (EGD) WITH PROPOFOL;  Surgeon: Mauri Pole, MD;  Location: [facility];  Service: Endoscopy;  Laterality: N/A;  . ESOPHAGOGASTRODUODENOSCOPY (EGD) WITH PROPOFOL N/A 12/05/2018   Procedure: ESOPHAGOGASTRODUODENOSCOPY (EGD) WITH PROPOFOL;  Surgeon: Rogene Houston, MD;  [facility];  Service: Endoscopy;  Laterality: N/A;  1:35       HPI  from the history and physical done on the day of admission:    [name]  is a 74 y.o. male with medical history significant forcoronary artery disease with systolic CHF, cirrhosis, gastric ulcer, CKD IV -center with EMS with sudden respiratory distress and profound hypoxia with O2 sats in the 70s,  --Initially required BiPAP for prolonged periods of time -With IV diuresis patient has been weaned to nasal cannula at this time -BNP over 1600, chest x-ray with CHF findings -Troponin in the 40s -Denies chest pain specifically but patient was quite wheezy initially -By EDP patient kept saying I can't breathe --Received  Rocephin/azithromycin and Lasix in the ED with improvement on continuous BiPAP -Denies pleuritic symptoms   Hospital Course:    -Brief Summary: 74 y.o.malewith medical history significant forcoronary artery disease with systolic CHF, cirrhosis, gastric ulcer, CKDIV -presented with EMS with sudden respiratory distress and profound hypoxia with O2 sats in the 70s----admitted 01/07/2020 for acute hypoxic respiratory failure secondary to CHF exacerbation  A/p 1)HFrEF/acute on chronic combined systolic and diastolic dysfunction CHF--- EF on 01/07/2020 is 10 to 15%, echo also shows diastolic dysfunction--- on admission patient was profoundly hypoxic requiring continuous BiPAP for over 8 hours or so and aggressive IV diuresis -Hypoxia has resolved, post ambulation O2 sats 98% on room today -Patient diuresed very well with IV Lasix -Discharge home on Lasix 40 mg daily p.o., Aldactone 12.5 mg daily, potassium 10 mEq daily, give hydralazine 10 mg twice daily/isosorbide dinitrate 10 mg twice daily combo, Coreg as ordered --Need for very very low-salt diet emphasized -  2)Acute COPD exacerbation in current smoker----patient not interested in smoking cessation Improved with steroids and antibiotics -Discharge on Spiriva and as needed albuterol  3)Community-acquired Pneumonia--suspicion of pneumonia on admission,  -Overall much improved with antibiotics, bronchodilators and mucolytics -Okay to discharge on p.o. doxycycline and Omnicef along with bronchodilators and mucolytics -Hypoxia is resolved  4)Acute Hypoxic Respiratory Failure--- secondary to #1, #2 #3 above --Treated as above --Hypoxia has resolved, post ambulation O2 sats 98% on room today  5)CKD 4---creatinine  1.8 which is close to patient's recent baseline  , renally adjust medications, avoid nephrotoxic agents / dehydration / hypotension -Repeat BMP with PCP within a week advised  6)CAD--patient remains chest pain-free,  troponin noted, continue Lipitor, aspirin, and Coreg  7)social/ethics-patient is DNR/DNI,, patient with severe multiple comorbidities , palliative care consult appreciated  Disposition--Home with sister -Patient From: home D/C Place: home with [facility] PT and DME -Hypoxia has resolved, post ambulation O2 sats 98% on room today  Code Status : DNR  Family Communication:   Sister at bedside Consults  :  Palliative care  Discharge Condition: stable  Follow UP  Follow-up Information    Celene Squibb, MD. Schedule an appointment as soon as possible for a visit in 1 week(s).   Specialty: Internal Medicine Why: For repeat CBC and BMP test Contact information: [place] [address] [phone]           Diet and Activity recommendation:  As advised  Discharge Instructions    Discharge Instructions    Call MD for:  difficulty breathing, headache or visual disturbances   Complete by: As directed    Call MD for:  persistant dizziness or light-headedness   Complete by: As directed    Call MD for:  persistant nausea and vomiting   Complete by: As  directed    Call MD for:  severe uncontrolled pain   Complete by: As directed    Call MD for:  temperature >100.4   Complete by: As directed    Diet - low sodium heart healthy   Complete by: As directed    Discharge instructions   Complete by: As directed    1)Very low-salt diet advised 2)Weigh yourself daily, call if you gain more than 3 pounds in 1 day or more than 5 pounds in 1 week as your diuretic medications may need to be adjusted 3)Limit your Fluid  intake to no more than 60 ounces (1.8 Liters) per day 4) please note that there has been several changes to your medications--- 5) repeat CBC and BMP blood test with the primary care physician in about a week advised 6)Smoking  cessation strongly advised   Increase activity slowly   Complete by: As directed         Discharge Medications     Allergies  as of 01/09/2020   No Known Allergies     Medication List    TAKE these medications   albuterol 108 (90 Base) MCG/ACT inhaler Commonly known as: VENTOLIN HFA Inhale 2 puffs into the lungs every 4 (four) hours as needed for wheezing or shortness of breath.   allopurinol 100 MG tablet Commonly known as: ZYLOPRIM Take 100 mg by mouth daily.   atorvastatin 40 MG tablet Commonly known as: LIPITOR TAKE 1 TABLET BY MOUTH ONCE DAILY WITH SUPPER. What changed: See the new instructions.   carvedilol 6.25 MG tablet Commonly known as: COREG Take 1 tablet (6.25 mg total) by mouth 2 (two) times daily with a meal. What changed:   medication strength  how much to take  when to take this   cefdinir 300 MG capsule Commonly known as: OMNICEF Take 1 capsule (300 mg total) by mouth 2 (two) times daily for 5 days.   Daily-Vite Multivitamin Tabs Take 1 tablet by mouth daily.   doxycycline 100 MG tablet Commonly known as: VIBRA-TABS Take 1 tablet (100 mg total) by mouth 2 (two) times daily for 5 days.   folic acid 1 MG tablet Commonly known as: FOLVITE Take 1 tablet (1 mg total) by mouth daily.   furosemide 40 MG tablet Commonly known as: Lasix Take 1 tablet (40 mg total) by mouth daily.   guaiFENesin 600 MG 12 hr tablet Commonly known as: MUCINEX Take 1 tablet (600 mg total) by mouth 2 (two) times daily.   hydrALAZINE 10 MG tablet Commonly known as: APRESOLINE Take 1 tablet (10 mg total) by mouth 2 (two) times daily.   isosorbide dinitrate 10 MG tablet Commonly known as: ISORDIL Take 1 tablet (10 mg total) by mouth 2 (two) times daily.   Magnesium Oxide 400 (240 Mg) MG Tabs Take 1 tablet (400 mg total) by mouth daily.   multivitamin with minerals tablet Take 1 tablet by mouth daily.   nitroGLYCERIN 0.4 MG SL tablet Commonly known as: NITROSTAT Place 0.4 mg under the tongue every 5 (five) minutes as needed for chest pain.   pantoprazole 40 MG tablet Commonly known as:  PROTONIX TAKE 1 TABLET BY MOUTH ONCE DAILY BEFORE BREAKFAST. What changed: See the new instructions.   potassium chloride 10 MEQ tablet Commonly known as: KLOR-CON Take 1 tablet (10 mEq total) by mouth daily.   Spiriva HandiHaler 18 MCG inhalation capsule Generic drug: tiotropium Place 1 capsule (18 mcg total) into inhaler and inhale daily.  spironolactone 25 MG tablet Commonly known as: Aldactone Take 0.5 tablets (12.5 mg total) by mouth daily.   thiamine 100 MG tablet Take 1 tablet (100 mg total) by mouth daily. What changed: how much to take   vitamin B-12 50 MCG tablet Commonly known as: CYANOCOBALAMIN Take 1 tablet (50 mcg total) by mouth daily.            Durable Medical Equipment  (From admission, onward)         Start     Ordered   01/09/20 1053  For home use only DME 4 wheeled rolling walker with seat  Once    Comments: congestive heart failure-generalized weakness and deconditioning  Question:  Patient needs a walker to treat with the following condition  Answer:  Dyspnea and respiratory abnormalities   01/09/20 1053   01/09/20 1053  For home use only DME Bedside commode  Once    Comments: congestive heart failure-generalized weakness and deconditioning  Question:  Patient needs a bedside commode to treat with the following condition  Answer:  Dyspnea and respiratory abnormalities   01/09/20 1053          Major procedures and Radiology Reports - PLEASE review detailed and final reports for all details, in brief -    DG Chest 2 View  Result Date: 01/08/2020 CLINICAL DATA:  Dyspnea and respiratory abnormalities. EXAM: CHEST - 2 VIEW COMPARISON:  Radiograph yesterday. CT 11/05/2018 FINDINGS: Upper normal heart size with improvement from prior exam. Aortic atherosclerosis. Significantly improved pulmonary edema since yesterday. Small residual pleural effusions, right greater than left. Improving opacity at the right lung base. No pneumothorax. No acute  osseous abnormalities are seen. IMPRESSION: 1. Improving pulmonary edema since yesterday. 2. Improving pleural effusions, small residual right greater than left. 3. Improving right basilar opacity. Aortic Atherosclerosis (ICD10-I70.0). Electronically Signed   By: Keith Rake M.D.   On: 01/08/2020 15:57   DG Chest Port 1 View  Result Date: 01/07/2020 CLINICAL DATA:  Acute onset shortness of breath.  Hypoxia. EXAM: PORTABLE CHEST 1 VIEW COMPARISON:  One-view chest x-ray 12/06/2018 FINDINGS: Heart size is exaggerated by low lung volumes. Atherosclerotic calcifications are present at the arch. Mild diffuse edema is present. Right greater than left lower lobe airspace disease is present. Right effusion is noted. IMPRESSION: 1. Dense right lower lobe airspace disease compatible with pneumonia. 2. Slightly less prominent left lower lobe airspace disease is also concerning for infection. 3. Mild diffuse edema. 4. Right pleural effusion. 5. Atherosclerosis. Electronically Signed   By: San Morelle M.D.   On: 01/07/2020 06:28   ECHOCARDIOGRAM COMPLETE  Result Date: 01/07/2020    ECHOCARDIOGRAM REPORT   Patient Name:   KHYLEN RIOLO Date of Exam: 01/07/2020 Medical Rec #:  [id number]          Height:       66.0 in Accession #:    [id number]         Weight:       120.0 lb Date of Birth:  10/23/1945          BSA:          1.610 m Patient Age:    16 years           BP:           150/74 mmHg Patient Gender: M                  HR:  86 bpm. Exam Location:  [facility] Procedure: 2D Echo, Cardiac Doppler and Color Doppler Indications:    Dyspnea 786.09 / R06.00  History:        Patient has prior history of Echocardiogram examinations, most                 recent 12/19/2016. CHF, CAD; Risk Factors:Dyslipidemia. Alcohol                 dependence,Acute renal failure (ARF),Cirrhosis,Chronic systolic                 congestive heart failure,Tobacco abuse.  Sonographer:    Alvino Chapel RCS Referring Phys:  [phone] Valinda Fedie IMPRESSIONS  1. Left ventricular ejection fraction, by estimation, is 10-15%. The left ventricle has severely decreased function. The left ventricle demonstrates global hypokinesis. The left ventricular internal cavity size was mildly dilated. Left ventricular diastolic parameters are consistent with Grade I diastolic dysfunction (impaired relaxation). Elevated left ventricular end-diastolic pressure.  2. Right ventricular systolic function is mildly reduced. The right ventricular size is normal. There is mildly elevated pulmonary artery systolic pressure.  3. Left atrial size was severely dilated.  4. The mitral valve is grossly normal. Mild mitral valve regurgitation.  5. The aortic valve is tricuspid. Aortic valve regurgitation is trivial. No aortic stenosis is present.  6. The inferior vena cava is dilated in size with <50% respiratory variability, suggesting right atrial pressure of 15 mmHg. FINDINGS  Left Ventricle: Left ventricular ejection fraction, by estimation, is 10-15%. The left ventricle has severely decreased function. The left ventricle demonstrates global hypokinesis. The left ventricular internal cavity size was mildly dilated. There is no left ventricular hypertrophy. Left ventricular diastolic parameters are consistent with Grade I diastolic dysfunction (impaired relaxation). Elevated left ventricular end-diastolic pressure.  LV Wall Scoring: The anterior septum is akinetic. Right Ventricle: The right ventricular size is normal. No increase in right ventricular wall thickness. Right ventricular systolic function is mildly reduced. There is mildly elevated pulmonary artery systolic pressure. The tricuspid regurgitant velocity  is 2.45 m/s, and with an assumed right atrial pressure of 15 mmHg, the estimated right ventricular systolic pressure is 64.4 mmHg. Left Atrium: Left atrial size was severely dilated. Right Atrium: Right atrial size was normal in size. Pericardium:  There is no evidence of pericardial effusion. Mitral Valve: The mitral valve is grossly normal. Mild mitral valve regurgitation. Tricuspid Valve: The tricuspid valve is grossly normal. Tricuspid valve regurgitation is mild. Aortic Valve: The aortic valve is tricuspid. Aortic valve regurgitation is trivial. No aortic stenosis is present. Pulmonic Valve: The pulmonic valve was not well visualized. Pulmonic valve regurgitation is not visualized. Aorta: The aortic root is normal in size and structure. Venous: The inferior vena cava is dilated in size with less than 50% respiratory variability, suggesting right atrial pressure of 15 mmHg. IAS/Shunts: No atrial level shunt detected by color flow Doppler.  LEFT VENTRICLE PLAX 2D LVIDd:         5.39 cm      Diastology LVIDs:         5.24 cm      LV e' lateral:   7.07 cm/s LV PW:         0.92 cm      LV E/e' lateral: 12.6 LV IVS:        0.88 cm      LV e' medial:    3.92 cm/s LVOT diam:     2.00 cm  LV E/e' medial:  22.7 LV SV:         39 LV SV Index:   24 LVOT Area:     3.14 cm  LV Volumes (MOD) LV vol d, MOD A2C: 130.0 ml LV vol d, MOD A4C: 136.0 ml LV vol s, MOD A2C: 118.0 ml LV vol s, MOD A4C: 106.0 ml LV SV MOD A2C:     12.0 ml LV SV MOD A4C:     136.0 ml LV SV MOD BP:      18.1 ml RIGHT VENTRICLE RV S prime:     15.10 cm/s TAPSE (M-mode): 1.4 cm LEFT ATRIUM             Index LA diam:        3.20 cm 1.99 cm/m LA Vol (A2C):   65.0 ml 40.38 ml/m LA Vol (A4C):   66.5 ml 41.32 ml/m LA Biplane Vol: 67.4 ml 41.88 ml/m  AORTIC VALVE LVOT Vmax:   64.30 cm/s LVOT Vmean:  39.400 cm/s LVOT VTI:    0.124 m MITRAL VALVE               TRICUSPID VALVE MV Area (PHT): 5.32 cm    TR Peak grad:   24.0 mmHg MV Decel Time: 143 msec    TR Vmax:        245.00 cm/s MV E velocity: 89.10 cm/s MV A velocity: 89.80 cm/s  SHUNTS MV E/A ratio:  0.99        Systemic VTI:  0.12 m                            Systemic Diam: 2.00 cm Kate Sable MD Electronically signed by Kate Sable  MD Signature Date/Time: 01/07/2020/2:47:16 PM    Final     Micro Results   Recent Results (from the past 240 hour(s))  Respiratory Panel by RT PCR (Flu A&B, Covid) - Nasopharyngeal Swab     Status: None   Collection Time: 01/07/20  5:59 AM   Specimen: Nasopharyngeal Swab  Result Value Ref Range Status   SARS Coronavirus 2 by RT PCR NEGATIVE NEGATIVE Final    Comment: (NOTE) SARS-CoV-2 target nucleic acids are NOT DETECTED. The SARS-CoV-2 RNA is generally detectable in upper respiratoy specimens during the acute phase of infection. The lowest concentration of SARS-CoV-2 viral copies this assay can detect is 131 copies/mL. A negative result does not preclude SARS-Cov-2 infection and should not be used as the sole basis for treatment or other patient management decisions. A negative result may occur with  improper specimen collection/handling, submission of specimen other than nasopharyngeal swab, presence of viral mutation(s) within the areas targeted by this assay, and inadequate number of viral copies (<131 copies/mL). A negative result must be combined with clinical observations, patient history, and epidemiological information. The expected result is Negative. Fact Sheet for Patients:  PinkCheek.be Fact Sheet for Healthcare Providers:  GravelBags.it This test is not yet ap proved or cleared by the [place] FDA and  has been authorized for detection and/or diagnosis of SARS-CoV-2 by FDA under an Emergency Use Authorization (EUA). This EUA will remain  in effect (meaning this test can be used) for the duration of the COVID-19 declaration under Section 564(b)(1) of the Act, 21 U.S.C. section 360bbb-3(b)(1), unless the authorization is terminated or revoked sooner.    Influenza A by PCR NEGATIVE NEGATIVE Final   Influenza B by PCR NEGATIVE NEGATIVE Final  Comment: (NOTE) The Xpert Xpress SARS-CoV-2/FLU/RSV assay is  intended as an aid in  the diagnosis of influenza from Nasopharyngeal swab specimens and  should not be used as a sole basis for treatment. Nasal washings and  aspirates are unacceptable for Xpert Xpress SARS-CoV-2/FLU/RSV  testing. Fact Sheet for Patients: PinkCheek.be Fact Sheet for Healthcare Providers: GravelBags.it This test is not yet approved or cleared by the [place] FDA and  has been authorized for detection and/or diagnosis of SARS-CoV-2 by  FDA under an Emergency Use Authorization (EUA). This EUA will remain  in effect (meaning this test can be used) for the duration of the  Covid-19 declaration under Section 564(b)(1) of the Act, 21  U.S.C. section 360bbb-3(b)(1), unless the authorization is  terminated or revoked. Performed at [facility], [address] [place], [place] [address]   Culture, blood (routine x 2)     Status: None (Preliminary result)   Collection Time: 01/07/20  7:27 AM   Specimen: BLOOD RIGHT ARM  Result Value Ref Range Status   Specimen Description BLOOD RIGHT ARM  Final   Special Requests   Final    BOTTLES DRAWN AEROBIC AND ANAEROBIC Blood Culture adequate volume   Culture   Final    NO GROWTH 2 DAYS Performed at [facility], [address], [place], [place] [address]    Report Status PENDING  Incomplete  Culture, blood (routine x 2)     Status: None (Preliminary result)   Collection Time: 01/07/20  7:38 AM   Specimen: BLOOD LEFT ARM  Result Value Ref Range Status   Specimen Description BLOOD LEFT ARM  Final   Special Requests   Final    BOTTLES DRAWN AEROBIC AND ANAEROBIC Blood Culture adequate volume   Culture   Final    NO GROWTH 2 DAYS Performed at [facility], [address], [place], [place] [address]    Report Status PENDING  Incomplete  MRSA PCR Screening     Status: None   Collection Time: 01/07/20  4:27 PM   Specimen: Nasal Mucosa; Nasopharyngeal  Result Value Ref  Range Status   MRSA by PCR NEGATIVE NEGATIVE Final    Comment:        The GeneXpert MRSA Assay (FDA approved for NASAL specimens only), is one component of a comprehensive MRSA colonization surveillance program. It is not intended to diagnose MRSA infection nor to guide or monitor treatment for MRSA infections. Performed at [facility], [address] [place], [place] [address]        Today   Subjective    Sims Laday today has no new complaints --Hypoxia has resolved, post ambulation O2 sats 98% on room today -Sister at bedside, and discharge plan reviewed -        No chest pains no palpitations no dizziness no shortness of breath at rest  Patient has been seen and examined prior to discharge   Objective   Blood pressure 131/62, pulse 77, temperature 97.8 F (36.6 C), temperature source Oral, resp. rate 19, height 5\' 6"  (1.676 m), weight 54.5 kg, SpO2 97 %.   Intake/Output Summary (Last 24 hours) at 01/09/2020 1120 Last data filed at 01/08/2020 2113 Gross per 24 hour  Intake 350 ml  Output 800 ml  Net -450 ml    Exam Gen:- Awake Alert, no acute distress , speaking in complete sentences HEENT:- [place].AT, No sclera icterus Neck-Supple Neck,No JVD,.  Lungs-much improved air movement, no wheezing  CV- S1, S2 normal, regular  Abd-  +ve B.Sounds, Abd Soft, No tenderness,    Extremity/Skin:- No  edema,   good pulses Psych-affect is appropriate, oriented x3 Neuro-no new focal deficits, no tremors    Data Review   CBC w Diff:  Lab Results  Component Value Date   WBC 8.3 01/08/2020   HGB 10.8 (L) 01/08/2020   HGB 11.5 (L) 09/14/2019   HCT 33.4 (L) 01/08/2020   HCT 34.3 (L) 09/14/2019   PLT 179 01/08/2020   PLT 158 09/14/2019   LYMPHOPCT 67 01/07/2020   BANDSPCT 2 01/07/2020   MONOPCT 3 01/07/2020   EOSPCT 6 01/07/2020   BASOPCT 1 01/07/2020    CMP:  Lab Results  Component Value Date   NA 136 01/09/2020   NA 139 09/14/2019   K 3.7 01/09/2020   CL  103 01/09/2020   CO2 23 01/09/2020   BUN 43 (H) 01/09/2020   BUN 10 09/14/2019   CREATININE 1.83 (H) 01/09/2020   CREATININE 1.48 (H) 03/28/2012   PROT 7.9 01/07/2020   PROT 7.9 09/14/2019   ALBUMIN 3.1 (L) 01/07/2020   ALBUMIN 2.9 (L) 09/14/2019   BILITOT 0.5 01/07/2020   BILITOT 0.4 09/14/2019   ALKPHOS 67 01/07/2020   AST 32 01/07/2020   ALT 19 01/07/2020  .   Total Discharge time is about 33 minutes  Roxan Hockey M.D on 01/09/2020 at 11:20 AM  Go to [email or web address] -  for contact info  [facility] - Office  [phone]

## 2020-01-11 ENCOUNTER — Other Ambulatory Visit: Payer: Self-pay | Admitting: Family Medicine

## 2020-01-11 DIAGNOSIS — I509 Heart failure, unspecified: Secondary | ICD-10-CM

## 2020-01-11 DIAGNOSIS — M6281 Muscle weakness (generalized): Secondary | ICD-10-CM | POA: Diagnosis not present

## 2020-01-12 DIAGNOSIS — Z0001 Encounter for general adult medical examination with abnormal findings: Secondary | ICD-10-CM | POA: Diagnosis not present

## 2020-01-12 LAB — CULTURE, BLOOD (ROUTINE X 2)
Culture: NO GROWTH
Culture: NO GROWTH
Special Requests: ADEQUATE
Special Requests: ADEQUATE

## 2020-01-15 DIAGNOSIS — E782 Mixed hyperlipidemia: Secondary | ICD-10-CM | POA: Diagnosis not present

## 2020-01-15 DIAGNOSIS — K2100 Gastro-esophageal reflux disease with esophagitis, without bleeding: Secondary | ICD-10-CM | POA: Diagnosis not present

## 2020-01-15 DIAGNOSIS — I509 Heart failure, unspecified: Secondary | ICD-10-CM | POA: Diagnosis not present

## 2020-01-15 DIAGNOSIS — I10 Essential (primary) hypertension: Secondary | ICD-10-CM | POA: Diagnosis not present

## 2020-01-15 DIAGNOSIS — J9601 Acute respiratory failure with hypoxia: Secondary | ICD-10-CM | POA: Diagnosis not present

## 2020-01-15 DIAGNOSIS — J9691 Respiratory failure, unspecified with hypoxia: Secondary | ICD-10-CM | POA: Diagnosis not present

## 2020-01-15 DIAGNOSIS — I5023 Acute on chronic systolic (congestive) heart failure: Secondary | ICD-10-CM | POA: Diagnosis not present

## 2020-02-08 DIAGNOSIS — I5084 End stage heart failure: Secondary | ICD-10-CM | POA: Diagnosis not present

## 2020-03-17 ENCOUNTER — Encounter: Payer: Self-pay | Admitting: *Deleted

## 2020-03-17 ENCOUNTER — Encounter: Payer: Self-pay | Admitting: Cardiology

## 2020-03-17 ENCOUNTER — Other Ambulatory Visit: Payer: Self-pay

## 2020-03-17 ENCOUNTER — Ambulatory Visit (INDEPENDENT_AMBULATORY_CARE_PROVIDER_SITE_OTHER): Admitting: Cardiology

## 2020-03-17 VITALS — BP 112/64 | HR 72 | Ht 64.0 in | Wt 118.0 lb

## 2020-03-17 DIAGNOSIS — I5022 Chronic systolic (congestive) heart failure: Secondary | ICD-10-CM | POA: Diagnosis not present

## 2020-03-17 DIAGNOSIS — I25119 Atherosclerotic heart disease of native coronary artery with unspecified angina pectoris: Secondary | ICD-10-CM | POA: Diagnosis not present

## 2020-03-17 DIAGNOSIS — I255 Ischemic cardiomyopathy: Secondary | ICD-10-CM

## 2020-03-17 DIAGNOSIS — N184 Chronic kidney disease, stage 4 (severe): Secondary | ICD-10-CM

## 2020-03-17 NOTE — Patient Instructions (Addendum)
Your physician recommends that you schedule a follow-up appointment in: [place], NP  Your physician recommends that you continue on your current medications as directed. Please refer to the Current Medication list given to you today.  Thank you for choosing [facility]!!

## 2020-03-17 NOTE — Progress Notes (Signed)
Cardiology Office Note  Date: 03/17/2020   ID: [name], DOB May 19, 1946, MRN [id number]  PCP:  Celene Squibb, MD  Cardiologist:  Rozann Lesches, MD Electrophysiologist:  None   Chief Complaint  Patient presents with  . End-stage cardiomyopathy    History of Present Illness: [name] is a 74 y.o. male former patient of Dr. Lattie Haw, referred to the office for cardiology consultation by Dr. [name] for the evaluation of end-stage cardiomyopathy and heart failure.  Recent records from Dr. Juel Burrow office reviewed.  Records indicate hospitalization in April at [facility], managed on the hospitalist service for hypoxic respiratory failure in the setting of heart failure exacerbation and renal insufficiency.  He was made DNR per discussion and seen by the Palliative Care team at that point.  Echocardiogram at that time revealed LVEF in the range of 10 to 15%.  He currently has home hospice nursing in place.  He is here today with his wife.  Based on discussion today, he has been reasonably stable despite the situation.  Appetite has been fair, improved after stopping Chantix.  His weight has stabilized just under 120.  He does not describe any orthopnea or PND, has occasional mild ankle swelling.  No obvious angina, palpitations, or syncope.  I reviewed his cardiac regimen which is outlined below and reasonable in terms of combination.  He is hemodynamically stable today and report recent lab work per Dr. [name] which we are requesting for review.  Past Medical History:  Diagnosis Date  . CAD (coronary artery disease)    IMI in 5/07 with CTO mid cx, 60% LAD, 99% RCA-> BMS; mod. impaired LV function; EF:35-40% 2/10  . Cardiomyopathy ([place])   . Cirrhosis ([place])    History of excessive alcohol use  . CKD (chronic kidney disease) stage 4, GFR 15-29 ml/min (HCC)   . Dementia ([place])   . Gastroesophageal reflux disease   . Hyperlipidemia     Past Surgical History:  Procedure  Laterality Date  . BIOPSY  12/05/2018   Procedure: BIOPSY;  Surgeon: Rogene Houston, MD;  [facility];  Service: Endoscopy;;  gastric  . ESOPHAGOGASTRODUODENOSCOPY N/A 12/19/2016   Procedure: ESOPHAGOGASTRODUODENOSCOPY (EGD);  Surgeon: Rogene Houston, MD;  [facility];  Service: Endoscopy;  Laterality: N/A;  . ESOPHAGOGASTRODUODENOSCOPY N/A 12/21/2016   Procedure: ESOPHAGOGASTRODUODENOSCOPY (EGD);  Surgeon: Danie Binder, MD;  [facility];  Service: Endoscopy;  Laterality: N/A;  . ESOPHAGOGASTRODUODENOSCOPY (EGD) WITH PROPOFOL N/A 12/22/2016   Procedure: ESOPHAGOGASTRODUODENOSCOPY (EGD) WITH PROPOFOL;  Surgeon: Mauri Pole, MD;  Location: [facility];  Service: Endoscopy;  Laterality: N/A;  . ESOPHAGOGASTRODUODENOSCOPY (EGD) WITH PROPOFOL N/A 12/05/2018   Procedure: ESOPHAGOGASTRODUODENOSCOPY (EGD) WITH PROPOFOL;  Surgeon: Rogene Houston, MD;  [facility];  Service: Endoscopy;  Laterality: N/A;  1:35    Current Outpatient Medications  Medication Sig Dispense Refill  . albuterol (VENTOLIN HFA) 108 (90 Base) MCG/ACT inhaler Inhale 2 puffs into the lungs every 4 (four) hours as needed for wheezing or shortness of breath. 18 g 2  . allopurinol (ZYLOPRIM) 100 MG tablet Take 100 mg by mouth daily.    [name] atorvastatin (LIPITOR) 40 MG tablet TAKE 1 TABLET BY MOUTH ONCE DAILY WITH SUPPER. 30 tablet 3  . carvedilol (COREG) 6.25 MG tablet Take 1 tablet (6.25 mg total) by mouth 2 (two) times daily with a meal. 60 tablet 3  . folic acid (FOLVITE) 1 MG tablet Take  1 tablet (1 mg total) by mouth daily. 30 tablet 3  . furosemide (LASIX) 40 MG tablet Take 1 tablet (40 mg total) by mouth daily. 30 tablet 11  . guaiFENesin (MUCINEX) 600 MG 12 hr tablet Take 1 tablet (600 mg total) by mouth 2 (two) times daily. 20 tablet 0  . hydrALAZINE (APRESOLINE) 10 MG tablet Take 1 tablet (10 mg total) by mouth 2 (two) times daily. 60 tablet 5  . isosorbide dinitrate  (ISORDIL) 10 MG tablet Take 1 tablet (10 mg total) by mouth 2 (two) times daily. 60 tablet 5  . Magnesium Oxide 400 (240 Mg) MG TABS Take 1 tablet (400 mg total) by mouth daily. 30 tablet 3  . Multiple Vitamin (DAILY-VITE MULTIVITAMIN) TABS Take 1 tablet by mouth daily.    . Multiple Vitamins-Minerals (MULTIVITAMIN WITH MINERALS) tablet Take 1 tablet by mouth daily. 30 tablet 3  . nitroGLYCERIN (NITROSTAT) 0.4 MG SL tablet Place 0.4 mg under the tongue every 5 (five) minutes as needed for chest pain.     . pantoprazole (PROTONIX) 40 MG tablet TAKE 1 TABLET BY MOUTH ONCE DAILY BEFORE BREAKFAST. (Patient taking differently: Take 40 mg by mouth daily. ) 30 tablet 5  . potassium chloride (KLOR-CON) 10 MEQ tablet Take 1 tablet (10 mEq total) by mouth daily. 30 tablet 3  . spironolactone (ALDACTONE) 25 MG tablet Take 0.5 tablets (12.5 mg total) by mouth daily. 15 tablet 5  . thiamine 100 MG tablet Take 1 tablet (100 mg total) by mouth daily. (Patient taking differently: Take 50 mg by mouth daily. ) 30 tablet 2  . tiotropium (SPIRIVA HANDIHALER) 18 MCG inhalation capsule Place 1 capsule (18 mcg total) into inhaler and inhale daily. 30 capsule 5  . vitamin B-12 (CYANOCOBALAMIN) 50 MCG tablet Take 1 tablet (50 mcg total) by mouth daily. 30 tablet 3   No current facility-administered medications for this visit.   Allergies:  Patient has no known allergies.   Social History: The patient  reports that he has been smoking. He has a 60.00 pack-year smoking history. He has never used smokeless tobacco. He reports current alcohol use. He reports that he does not use drugs.   Family History: The patient's family history includes Other in his brother.   ROS:   Memory loss.  Intermittent gout flares.  Physical Exam: VS:  BP 112/64   Pulse 72   Ht 5\' 4"  (1.626 m)   Wt 118 lb (53.5 kg)   SpO2 97%   BMI 20.25 kg/m , BMI Body mass index is 20.25 kg/m.  Wt Readings from Last 3 Encounters:  03/17/20 118 lb  (53.5 kg)  01/09/20 120 lb 2.4 oz (54.5 kg)  09/14/19 110 lb 3.2 oz (50 kg)    General: Thin, chronically ill-appearing male, no distress. HEENT: Conjunctiva and lids normal, wearing a mask. Neck: Supple, no elevated JVP or carotid bruits. Lungs: Decreased breath sounds, nonlabored breathing at rest. Cardiac: Indistinct PMI with distant RRR, no obvious gallop or significant murmur. Abdomen: Soft, nontender, bowel sounds present. Extremities: Trace ankle edema, distal pulses 2+. Skin: Warm and dry. Musculoskeletal: Muscle wasting evident.  No kyphosis. Neuropsychiatric: Alert and oriented x3, affect grossly appropriate.  ECG:  An ECG dated 01/07/2020 was personally reviewed today and demonstrated:  Sinus tachycardia with left bundle branch block.  Recent Labwork: 06/08/2019: Magnesium 3.0 01/07/2020: ALT 19; AST 32; B Natriuretic Peptide 1,603.0 01/08/2020: Hemoglobin 10.8; Platelets 179 01/09/2020: BUN 43; Creatinine, Ser 1.83; Potassium 3.7; Sodium 136  Component Value Date/Time   CHOL 102 12/02/2010 1953   TRIG 80 12/02/2010 1953   HDL 34 (L) 12/02/2010 1953   CHOLHDL 3.0 Ratio 12/02/2010 1953   VLDL 16 12/02/2010 1953   LDLCALC 52 12/02/2010 1953    Other Studies Reviewed Today:  Echocardiogram 01/07/2020: 1. Left ventricular ejection fraction, by estimation, is 10-15%. The left  ventricle has severely decreased function. The left ventricle demonstrates  global hypokinesis. The left ventricular internal cavity size was mildly  dilated. Left ventricular  diastolic parameters are consistent with Grade I diastolic dysfunction  (impaired relaxation). Elevated left ventricular end-diastolic pressure.  2. Right ventricular systolic function is mildly reduced. The right  ventricular size is normal. There is mildly elevated pulmonary artery  systolic pressure.  3. Left atrial size was severely dilated.  4. The mitral valve is grossly normal. Mild mitral valve regurgitation.    5. The aortic valve is tricuspid. Aortic valve regurgitation is trivial.  No aortic stenosis is present.  6. The inferior vena cava is dilated in size with <50% respiratory  variability, suggesting right atrial pressure of 15 mmHg.   Assessment and Plan:  1.  End-stage cardiomyopathy with LVEF 10 to 15% by echocardiogram in April, noted that the setting of CAD and presumably ischemic in etiology, although certainly could be a mixed picture.  He is currently DNR and has home hospice nursing in place, reasonably stable on evaluation today.  Plan is to continue medical therapy, no further invasive or advanced heart failure treatment options will be pursued.  Current regimen includes Coreg, hydralazine, Isordil, Aldactone, Lasix, and potassium supplements.  Requesting recent lab work from Dr. [name].  2.  CKD stage III-IV, creatinine 1.83 and potassium 3.7 as of April.  Plan to review interval lab work as he may need adjustments in his diuretic regimen.  3.  CAD with reported history of old inferior infarct with chronic occlusion of the mid circumflex, BMS to the RCA in 2007, and moderate LAD disease.  No obvious angina reported although progressive CAD is very likely since that time.  Do not plan to pursue follow-up invasive testing/cardiac catheterization.  Continue medical therapy as noted above, also on Lipitor.  Medication Adjustments/Labs and Tests Ordered: Current medicines are reviewed at length with the patient today.  Concerns regarding medicines are outlined above.   Tests Ordered: No orders of the defined types were placed in this encounter.   Medication Changes: No orders of the defined types were placed in this encounter.   Disposition:  Follow up 2 months in the [place] office.  Signed, Satira Sark, MD, [facility] 03/17/2020 11:00 AM    [facility] [place], [place],  [address] Phone: [phone]; Fax: [phone]

## 2020-04-04 DIAGNOSIS — Y92538 Other ambulatory health services establishments as the place of occurrence of the external cause: Secondary | ICD-10-CM | POA: Diagnosis not present

## 2020-04-04 DIAGNOSIS — M1A00X0 Idiopathic chronic gout, unspecified site, without tophus (tophi): Secondary | ICD-10-CM | POA: Diagnosis not present

## 2020-04-04 DIAGNOSIS — Z720 Tobacco use: Secondary | ICD-10-CM | POA: Diagnosis not present

## 2020-04-04 DIAGNOSIS — E782 Mixed hyperlipidemia: Secondary | ICD-10-CM | POA: Diagnosis not present

## 2020-04-04 DIAGNOSIS — I10 Essential (primary) hypertension: Secondary | ICD-10-CM | POA: Diagnosis not present

## 2020-04-12 ENCOUNTER — Ambulatory Visit (INDEPENDENT_AMBULATORY_CARE_PROVIDER_SITE_OTHER): Payer: Medicare Other | Admitting: Internal Medicine

## 2020-04-14 ENCOUNTER — Encounter (INDEPENDENT_AMBULATORY_CARE_PROVIDER_SITE_OTHER): Payer: Self-pay | Admitting: Gastroenterology

## 2020-04-14 ENCOUNTER — Other Ambulatory Visit: Payer: Self-pay

## 2020-04-14 ENCOUNTER — Ambulatory Visit (INDEPENDENT_AMBULATORY_CARE_PROVIDER_SITE_OTHER): Admitting: Gastroenterology

## 2020-04-14 VITALS — BP 103/64 | HR 80 | Temp 98.0°F | Ht 64.0 in | Wt 120.0 lb

## 2020-04-14 DIAGNOSIS — D638 Anemia in other chronic diseases classified elsewhere: Secondary | ICD-10-CM | POA: Diagnosis not present

## 2020-04-14 DIAGNOSIS — K219 Gastro-esophageal reflux disease without esophagitis: Secondary | ICD-10-CM | POA: Diagnosis not present

## 2020-04-14 MED ORDER — PANTOPRAZOLE SODIUM 40 MG PO TBEC: DELAYED_RELEASE_TABLET | ORAL | 11 refills | Status: DC

## 2020-04-14 NOTE — Progress Notes (Signed)
Patient profile: [name] is a 74 y.o. male seen for follow up. Last seen in clinic 11/2018. Since last OV he was admitted in 12/2019 (acute respiratory failure) and 05/2019 (acute on chronic kidney injury)  History of Present Illness: [name] is seen today for follow up. He denies any GERD, n/v, dysphagia. He states appetite is good and no pain w/ eating. Sister who helps w/ his care endorses he is cleaning his plate. Takes protonix 40mg  before supper daily.   He is having a BM 1-2x/day. Denies any abdominal pain, constipation/diarrhea, blood in stool. Feels well today w/o complaints. Tolerating magnesium w/o diarrhea.   He has decreased to 5 cig/day. He is no longer drinking alcohol. No NSAIds.  Wt Readings from Last 3 Encounters:  04/14/20 120 lb (54.4 kg)  03/17/20 118 lb (53.5 kg)  01/09/20 120 lb 2.4 oz (54.5 kg)   11/2018 weight - 121lbs   Last Colonoscopy: none prior, declines today.    Last Endoscopy: 11/2018  - Normal esophagus. - Z-line regular, 38 cm from the incisors. - 2 cm hiatal hernia. - Scar in the gastric antrum. - Discolored and nodular mucosa in the gastric body. Biopsied. - Normal duodenal bulb and second portion of the duodenum.  2018-- Normal esophagus. - Non-obstructing non-bleeding gastric ulcer with a visible vessel. NSAID induced etiology. Treated with bipolar cautery. Clip (MR conditional) was placed adjacent to the ulcer for location marking to facilitate visulization on angio or CTA. - Normal examined duodenum. - No specimens collected.   Past Medical History:  Past Medical History:  Diagnosis Date  . CAD (coronary artery disease)    IMI in 5/07 with CTO mid cx, 60% LAD, 99% RCA-> BMS; mod. impaired LV function; EF:35-40% 2/10  . Cardiomyopathy ([place])   . Cirrhosis ([place])    History of excessive alcohol use  . CKD (chronic kidney disease) stage 4, GFR 15-29 ml/min (HCC)   . Dementia ([place])   . Gastroesophageal reflux disease    . Hyperlipidemia     Problem List: Patient Active Problem List   Diagnosis Date Noted  . Acute on chronic HFrEF (heart failure with reduced ejection fraction) EF 10 to 15% April 2021 01/08/2020  . HFrEF/Acute on chronic combined systolic and diastolic CHF (congestive heart failure) (HCC)--EF 10 to 15% April 2021 01/08/2020  . Goals of care, counseling/discussion   . Palliative care by specialist   . DNR (do not resuscitate) discussion   . Encounter for hospice care discussion   . Respiratory failure with hypoxia ([place]) 01/07/2020  . Acute gout of left hand 09/14/2019  . Cognitive impairment 06/11/2019  . Acute kidney injury ([place]) 06/09/2019  . Hyperkalemia 06/09/2019  . Gastric ulcer without hemorrhage or perforation 11/21/2018  . Hematochezia   . Chronic systolic congestive heart failure ([place])   . Hemorrhagic shock and encephalopathy syndrome ([place])   . Acute upper GI bleed   . Gastric ulcer 12/20/2016  . Acute blood loss anemia 12/20/2016  . Hypotension 12/18/2016  . Volume depletion 12/18/2016  . Ischemic cardiomyopathy 12/18/2016  . Anemia 12/18/2016  . Heme + stool 12/18/2016  . Dehydration   . Thrombocytopenia ([place])   . Malnutrition of moderate degree 12/15/2016  . AKI (acute kidney injury) ([place]) 12/13/2016  . Unintentional weight loss 12/13/2016  . Alcohol dependence ([place]) 12/13/2016  . Hyperglycemia 12/13/2016  . Elevated troponin 12/13/2016  . Anemia, normocytic normochromic 03/28/2012  . Coronary artery disease   . Gastroesophageal reflux disease   .  Chronic kidney disease   . Cirrhosis ([place])   . Tobacco abuse     Past Surgical History: Past Surgical History:  Procedure Laterality Date  . BIOPSY  12/05/2018   Procedure: BIOPSY;  Surgeon: Rogene Houston, MD;  [facility];  Service: Endoscopy;;  gastric  . ESOPHAGOGASTRODUODENOSCOPY N/A 12/19/2016   Procedure: ESOPHAGOGASTRODUODENOSCOPY (EGD);  Surgeon: Rogene Houston, MD;  [facility];   Service: Endoscopy;  Laterality: N/A;  . ESOPHAGOGASTRODUODENOSCOPY N/A 12/21/2016   Procedure: ESOPHAGOGASTRODUODENOSCOPY (EGD);  Surgeon: Danie Binder, MD;  [facility];  Service: Endoscopy;  Laterality: N/A;  . ESOPHAGOGASTRODUODENOSCOPY (EGD) WITH PROPOFOL N/A 12/22/2016   Procedure: ESOPHAGOGASTRODUODENOSCOPY (EGD) WITH PROPOFOL;  Surgeon: Mauri Pole, MD;  Location: [facility];  Service: Endoscopy;  Laterality: N/A;  . ESOPHAGOGASTRODUODENOSCOPY (EGD) WITH PROPOFOL N/A 12/05/2018   Procedure: ESOPHAGOGASTRODUODENOSCOPY (EGD) WITH PROPOFOL;  Surgeon: Rogene Houston, MD;  [facility];  Service: Endoscopy;  Laterality: N/A;  1:35    Allergies: No Known Allergies    Home Medications:  Current Outpatient Medications:  .  albuterol (VENTOLIN HFA) 108 (90 Base) MCG/ACT inhaler, Inhale 2 puffs into the lungs every 4 (four) hours as needed for wheezing or shortness of breath., Disp: 18 g, Rfl: 2 .  allopurinol (ZYLOPRIM) 100 MG tablet, Take 100 mg by mouth daily., Disp: , Rfl:  .  atorvastatin (LIPITOR) 40 MG tablet, TAKE 1 TABLET BY MOUTH ONCE DAILY WITH SUPPER., Disp: 30 tablet, Rfl: 3 .  carvedilol (COREG) 6.25 MG tablet, Take 1 tablet (6.25 mg total) by mouth 2 (two) times daily with a meal., Disp: 60 tablet, Rfl: 3 .  folic acid (FOLVITE) 1 MG tablet, Take 1 tablet (1 mg total) by mouth daily., Disp: 30 tablet, Rfl: 3 .  furosemide (LASIX) 40 MG tablet, Take 1 tablet (40 mg total) by mouth daily., Disp: 30 tablet, Rfl: 11 .  guaiFENesin (MUCINEX) 600 MG 12 hr tablet, Take 1 tablet (600 mg total) by mouth 2 (two) times daily., Disp: 20 tablet, Rfl: 0 .  hydrALAZINE (APRESOLINE) 10 MG tablet, Take 1 tablet (10 mg total) by mouth 2 (two) times daily., Disp: 60 tablet, Rfl: 5 .  isosorbide dinitrate (ISORDIL) 10 MG tablet, Take 1 tablet (10 mg total) by mouth 2 (two) times daily., Disp: 60 tablet, Rfl: 5 .  Magnesium Oxide 400 (240 Mg) MG TABS, Take 1 tablet  (400 mg total) by mouth daily., Disp: 30 tablet, Rfl: 3 .  Multiple Vitamin (DAILY-VITE MULTIVITAMIN) TABS, Take 1 tablet by mouth daily., Disp: , Rfl:  .  Multiple Vitamins-Minerals (MULTIVITAMIN WITH MINERALS) tablet, Take 1 tablet by mouth daily., Disp: 30 tablet, Rfl: 3 .  pantoprazole (PROTONIX) 40 MG tablet, TAKE 1 TABLET BY MOUTH ONCE DAILY BEFORE BREAKFAST., Disp: 30 tablet, Rfl: 11 .  potassium chloride (KLOR-CON) 10 MEQ tablet, Take 1 tablet (10 mEq total) by mouth daily., Disp: 30 tablet, Rfl: 3 .  spironolactone (ALDACTONE) 25 MG tablet, Take 0.5 tablets (12.5 mg total) by mouth daily., Disp: 15 tablet, Rfl: 5 .  thiamine 100 MG tablet, Take 1 tablet (100 mg total) by mouth daily. (Patient taking differently: Take 50 mg by mouth daily. ), Disp: 30 tablet, Rfl: 2 .  tiotropium (SPIRIVA HANDIHALER) 18 MCG inhalation capsule, Place 1 capsule (18 mcg total) into inhaler and inhale daily., Disp: 30 capsule, Rfl: 5 .  vitamin B-12 (CYANOCOBALAMIN) 50 MCG tablet, Take 1 tablet (50 mcg total) by mouth  daily., Disp: 30 tablet, Rfl: 3 .  nitroGLYCERIN (NITROSTAT) 0.4 MG SL tablet, Place 0.4 mg under the tongue every 5 (five) minutes as needed for chest pain.  (Patient not taking: Reported on 04/14/2020), Disp: , Rfl:    Family History: family history includes Other in his brother.    Social History:   reports that he has been smoking. He has a 15.00 pack-year smoking history. He has never used smokeless tobacco. He reports previous alcohol use. He reports that he does not use drugs.   Review of Systems: Constitutional: overall wt stable Eyes: No changes in vision. ENT: No oral lesions, sore throat.  GI: see HPI.  Heme/Lymph: No easy bruising.  CV: No chest pain.  GU: No hematuria.  Integumentary: No rashes.  Neuro: No headaches.  Psych: No depression/anxiety.  Endocrine: No heat/cold intolerance.  Allergic/Immunologic: No urticaria.  Resp: No cough, SOB.  Musculoskeletal: No joint  swelling.    Physical Examination: BP (!) 103/64 (BP Location: Right Arm, Patient Position: Sitting, Cuff Size: Normal)   Pulse 80   Temp 98 F (36.7 C) (Oral)   Ht 5\' 4"  (1.626 m)   Wt 120 lb (54.4 kg)   BMI 20.60 kg/m  Gen: NAD, alert and oriented x 4, thin  HEENT: PEERLA, EOMI, Neck: supple, no JVD Chest: CTA bilaterally, no wheezes, crackles, or other adventitious sounds CV: RRR, no m/g/c/r Abd: soft, NT, ND, +BS in all four quadrants; no HSM, guarding, ridigity, or rebound tenderness Ext: no edema, well perfused with 2+ pulses, Skin: no rash or lesions noted on observed skin Lymph: no noted LAD  Data Reviewed:  Will request discharge labs from Dr. [name] done in July 2021    Assessment/Plan: [name] is a 74 y.o. male    Diagnoses and all orders for this visit:  Chronic GERD  Anemia of chronic disease  Other orders -     pantoprazole (PROTONIX) 40 MG tablet; TAKE 1 TABLET BY MOUTH ONCE DAILY BEFORE BREAKFAST.   1.  GERD with history of gastric ulcer-He is on Protonix daily.  He is no longer using NSAIDs.  He is asymptomatic.  Weight is stable.  He will continue PPI.  Congratulated him on avoiding alcohol and decreasing tobacco.  To contact us with any symptoms in interim, otherwise follow-up in 1 year  2.  Anemia of chronic disease-Baseline hemoglobin appears around 11, will request his most recent labs earlier this month from PCP. He denies any dark stools.   Recommendations: Follow-up 1 year.  Call sooner with any issues.   I personally performed the service, non-incident to. (WP)  Laurine Blazer, [facility]

## 2020-04-14 NOTE — Patient Instructions (Addendum)
We are continuing Protonix 40 mg once a day.  Please call with any questions or new symptoms.

## 2020-05-05 ENCOUNTER — Other Ambulatory Visit (INDEPENDENT_AMBULATORY_CARE_PROVIDER_SITE_OTHER): Payer: Self-pay | Admitting: Internal Medicine

## 2020-05-16 DIAGNOSIS — M109 Gout, unspecified: Secondary | ICD-10-CM | POA: Diagnosis not present

## 2020-05-16 DIAGNOSIS — I10 Essential (primary) hypertension: Secondary | ICD-10-CM | POA: Diagnosis not present

## 2020-05-16 DIAGNOSIS — K219 Gastro-esophageal reflux disease without esophagitis: Secondary | ICD-10-CM | POA: Diagnosis not present

## 2020-05-16 DIAGNOSIS — E782 Mixed hyperlipidemia: Secondary | ICD-10-CM | POA: Diagnosis not present

## 2020-05-16 DIAGNOSIS — I5023 Acute on chronic systolic (congestive) heart failure: Secondary | ICD-10-CM | POA: Diagnosis not present

## 2020-05-19 ENCOUNTER — Ambulatory Visit: Payer: Medicare Other | Admitting: Family Medicine

## 2020-05-30 ENCOUNTER — Other Ambulatory Visit: Payer: Self-pay

## 2020-05-30 ENCOUNTER — Encounter: Payer: Self-pay | Admitting: Family Medicine

## 2020-05-30 ENCOUNTER — Ambulatory Visit (INDEPENDENT_AMBULATORY_CARE_PROVIDER_SITE_OTHER): Admitting: Family Medicine

## 2020-05-30 VITALS — BP 112/64 | HR 84 | Ht 66.0 in | Wt 129.0 lb

## 2020-05-30 DIAGNOSIS — I255 Ischemic cardiomyopathy: Secondary | ICD-10-CM

## 2020-05-30 DIAGNOSIS — I2510 Atherosclerotic heart disease of native coronary artery without angina pectoris: Secondary | ICD-10-CM

## 2020-05-30 DIAGNOSIS — I5022 Chronic systolic (congestive) heart failure: Secondary | ICD-10-CM

## 2020-05-30 DIAGNOSIS — F17200 Nicotine dependence, unspecified, uncomplicated: Secondary | ICD-10-CM | POA: Diagnosis not present

## 2020-05-30 DIAGNOSIS — N184 Chronic kidney disease, stage 4 (severe): Secondary | ICD-10-CM | POA: Diagnosis not present

## 2020-05-30 NOTE — Progress Notes (Signed)
Cardiology Office Note  Date: 05/30/2020   ID: [name], DOB June 18, 1946, MRN [id number]  PCP:  Celene Squibb, MD  Cardiologist:  Rozann Lesches, MD Electrophysiologist:  None   Chief Complaint: Ischemic cardiomyopathy  History of Present Illness: [name] is a 74 y.o. male with a history of Ischemic cardiomyopathy, hypoxic respiratory failure, renal insufficiency.  Admission April 2021 [facility] for hypoxic respiratory failure in setting of heart failure exacerbation and renal insufficiency.He was made DNR and seen by palliative care team. Echo at that time EF 10-15%. Had home hospice nursing in place. His heart failure was reasonably stable. Plan was to continue medical therapy and no further invasive or advance heart failure options would be pursued. He was continuing Coreg, hydralazine, Isordil, aldactone, Lasix and potassium supplements. CKD stage III-IV Crt 1.83 and K 3.7 in April 2021. CAD: old inferior infarct with chronic occlusion of mLcx , BMS to RCA 2007 with moderate LAD disease. No anginal symptoms. No plans for further cardiac testing or invasive interventions.  Last saw Dr. Domenic [name] on 03/17/2020.  He has been doing reasonably well in spite of the situation.  He had no orthopnea, PND, DOE, palpitations, syncope, angina.   He presents today for 54-month follow-up.  States he has been doing well.  He denies any recent anginal or exertional symptoms, palpitations or arrhythmias, orthostatic symptoms, CVA or TIA-like symptoms, PND, orthopnea, lightheadedness, dizziness, presyncope or syncopal episodes.  Denies any bleeding issues.  No claudication-like symptoms, DVT or PE-like symptoms, or lower extremity edema.  Blood pressures well controlled.  He is adherent with his medication regimen.  His sister who is with him states the hospice nurse comes out to see him during the week.  He is walking with a cane today with no difficulties.   Past Medical History:  Diagnosis  Date  . CAD (coronary artery disease)    IMI in 5/07 with CTO mid cx, 60% LAD, 99% RCA-> BMS; mod. impaired LV function; EF:35-40% 2/10  . Cardiomyopathy ([place])   . Cirrhosis ([place])    History of excessive alcohol use  . CKD (chronic kidney disease) stage 4, GFR 15-29 ml/min (HCC)   . Dementia ([place])   . Gastroesophageal reflux disease   . Hyperlipidemia     Past Surgical History:  Procedure Laterality Date  . BIOPSY  12/05/2018   Procedure: BIOPSY;  Surgeon: Rogene Houston, MD;  [facility];  Service: Endoscopy;;  gastric  . ESOPHAGOGASTRODUODENOSCOPY N/A 12/19/2016   Procedure: ESOPHAGOGASTRODUODENOSCOPY (EGD);  Surgeon: Rogene Houston, MD;  [facility];  Service: Endoscopy;  Laterality: N/A;  . ESOPHAGOGASTRODUODENOSCOPY N/A 12/21/2016   Procedure: ESOPHAGOGASTRODUODENOSCOPY (EGD);  Surgeon: Danie Binder, MD;  [facility];  Service: Endoscopy;  Laterality: N/A;  . ESOPHAGOGASTRODUODENOSCOPY (EGD) WITH PROPOFOL N/A 12/22/2016   Procedure: ESOPHAGOGASTRODUODENOSCOPY (EGD) WITH PROPOFOL;  Surgeon: Mauri Pole, MD;  Location: [facility];  Service: Endoscopy;  Laterality: N/A;  . ESOPHAGOGASTRODUODENOSCOPY (EGD) WITH PROPOFOL N/A 12/05/2018   Procedure: ESOPHAGOGASTRODUODENOSCOPY (EGD) WITH PROPOFOL;  Surgeon: Rogene Houston, MD;  [facility];  Service: Endoscopy;  Laterality: N/A;  1:35    Current Outpatient Medications  Medication Sig Dispense Refill  . albuterol (VENTOLIN HFA) 108 (90 Base) MCG/ACT inhaler Inhale 2 puffs into the lungs every 4 (four) hours as needed for wheezing or shortness of breath. 18 g 2  . allopurinol (ZYLOPRIM) 100 MG tablet Take 100 mg by mouth daily.    [name]  atorvastatin (LIPITOR) 40 MG tablet TAKE 1 TABLET BY MOUTH ONCE DAILY WITH SUPPER. 30 tablet 3  . carvedilol (COREG) 6.25 MG tablet Take 1 tablet (6.25 mg total) by mouth 2 (two) times daily with a meal. 60 tablet 3  . folic acid (FOLVITE) 1 MG tablet  Take 1 tablet (1 mg total) by mouth daily. 30 tablet 3  . furosemide (LASIX) 40 MG tablet Take 1 tablet (40 mg total) by mouth daily. 30 tablet 11  . guaiFENesin (MUCINEX) 600 MG 12 hr tablet Take 1 tablet (600 mg total) by mouth 2 (two) times daily. 20 tablet 0  . hydrALAZINE (APRESOLINE) 10 MG tablet Take 1 tablet (10 mg total) by mouth 2 (two) times daily. 60 tablet 5  . isosorbide dinitrate (ISORDIL) 10 MG tablet Take 1 tablet (10 mg total) by mouth 2 (two) times daily. 60 tablet 5  . Magnesium Oxide 400 (240 Mg) MG TABS Take 1 tablet (400 mg total) by mouth daily. 30 tablet 3  . Multiple Vitamin (DAILY-VITE MULTIVITAMIN) TABS Take 1 tablet by mouth daily.    . Multiple Vitamins-Minerals (MULTIVITAMIN WITH MINERALS) tablet Take 1 tablet by mouth daily. 30 tablet 3  . nitroGLYCERIN (NITROSTAT) 0.4 MG SL tablet Place 0.4 mg under the tongue every 5 (five) minutes as needed for chest pain.      . pantoprazole (PROTONIX) 40 MG tablet TAKE 1 TABLET BY MOUTH ONCE DAILY BEFORE BREAKFAST. 90 tablet 1  . potassium chloride (KLOR-CON) 10 MEQ tablet Take 1 tablet (10 mEq total) by mouth daily. 30 tablet 3  . spironolactone (ALDACTONE) 25 MG tablet Take 0.5 tablets (12.5 mg total) by mouth daily. 15 tablet 5  . thiamine 100 MG tablet Take 1 tablet (100 mg total) by mouth daily. (Patient taking differently: Take 50 mg by mouth daily. ) 30 tablet 2  . tiotropium (SPIRIVA HANDIHALER) 18 MCG inhalation capsule Place 1 capsule (18 mcg total) into inhaler and inhale daily. 30 capsule 5  . vitamin B-12 (CYANOCOBALAMIN) 50 MCG tablet Take 1 tablet (50 mcg total) by mouth daily. 30 tablet 3   No current facility-administered medications for this visit.   Allergies:  Patient has no known allergies.   Social History: The patient  reports that he has been smoking. He has a 15.00 pack-year smoking history. He has never used smokeless tobacco. He reports previous alcohol use. He reports that he does not use drugs.    Family History: The patient's family history includes Other in his brother.   ROS:  Please see the history of present illness. Otherwise, complete review of systems is positive for none.  All other systems are reviewed and negative.   Physical Exam: VS:  BP 112/64   Pulse 84   Ht 5\' 6"  (1.676 m)   Wt 129 lb (58.5 kg)   SpO2 97%   BMI 20.82 kg/m , BMI Body mass index is 20.82 kg/m.  Wt Readings from Last 3 Encounters:  05/30/20 129 lb (58.5 kg)  04/14/20 120 lb (54.4 kg)  03/17/20 118 lb (53.5 kg)    General: Patient appears comfortable at rest. Neck: Supple, no elevated JVP or carotid bruits, no thyromegaly. Lungs: Clear to auscultation, nonlabored breathing at rest. Cardiac: Regular rate and rhythm, no S3 or significant systolic murmur, no pericardial rub. Extremities: No pitting edema, distal pulses 2+. Skin: Warm and dry. Musculoskeletal: No kyphosis. Neuropsychiatric: Alert and oriented x3, affect grossly appropriate.  ECG:    Recent Labwork: 06/08/2019: Magnesium 3.0  01/07/2020: ALT 19; AST 32; B Natriuretic Peptide 1,603.0 01/08/2020: Hemoglobin 10.8; Platelets 179 01/09/2020: BUN 43; Creatinine, Ser 1.83; Potassium 3.7; Sodium 136     Component Value Date/Time   CHOL 102 12/02/2010 1953   TRIG 80 12/02/2010 1953   HDL 34 (L) 12/02/2010 1953   CHOLHDL 3.0 Ratio 12/02/2010 1953   VLDL 16 12/02/2010 1953   [place] 52 12/02/2010 1953    Other Studies Reviewed Today:   Echocardiogram 01/07/2020: 1. Left ventricular ejection fraction, by estimation, is 10-15%. The left  ventricle has severely decreased function. The left ventricle demonstrates  global hypokinesis. The left ventricular internal cavity size was mildly  dilated. Left ventricular  diastolic parameters are consistent with Grade I diastolic dysfunction  (impaired relaxation). Elevated left ventricular end-diastolic pressure.  2. Right ventricular systolic function is mildly reduced. The right   ventricular size is normal. There is mildly elevated pulmonary artery  systolic pressure.  3. Left atrial size was severely dilated.  4. The mitral valve is grossly normal. Mild mitral valve regurgitation.  5. The aortic valve is tricuspid. Aortic valve regurgitation is trivial.  No aortic stenosis is present.  6. The inferior vena cava is dilated in size with <50% respiratory  variability, suggesting right atrial pressure of 15 mmHg.   Assessment and Plan:  1. Ischemic cardiomyopathy   2. CAD in native artery   3. Chronic systolic congestive heart failure ([place])   4. CKD (chronic kidney disease) stage 4, GFR 15-29 ml/min (HCC)    1. Ischemic cardiomyopathy Recent echocardiogram on 01/07/2020 showed EF 10 to 15%, mildly dilated LV, global hypokinesis, G1 DD, LA severely dilated mild MR trivial AR.  2. CAD in native artery No anginal symptoms.  Continue isosorbide dinitrate 10 mg p.o. twice daily.  Continue carvedilol 6.25 mg p.o. twice daily.  Continue sublingual nitroglycerin as needed for chest pain.  Continue atorvastatin 40 mg daily.  3. Chronic systolic congestive heart failure (HCC) No recent dyspnea or shortness of breath, no recent lower extremity edema.  He appears euvolemic.  Continue Coreg 6.25 mg p.o. twice daily.  Continue Lasix 40 mg daily.  Continue hydralazine 10 mg p.o. twice daily.  Continue spironolactone 12.5 mg p.o. daily  4. CKD (chronic kidney disease) stage 4, GFR 15-29 ml/min (HCC) Recent lab work from Dr. Juel Burrow office shows an improvement in his renal function.  5.  Smoking Patient's sister states he is only smoking around 3 cigarettes/day  Medication Adjustments/Labs and Tests Ordered: Current medicines are reviewed at length with the patient today.  Concerns regarding medicines are outlined above.   Disposition: Follow-up with Dr. Domenic [name] or [facility] 6 months. Signed, Levell July, NP 05/30/2020 2:38 PM    [facility] at  [address], [place], [place] [address] Phone: [phone]; Fax: [phone]

## 2020-05-30 NOTE — Patient Instructions (Signed)
Medication Instructions:  Continue all current medications.   Labwork: none  Testing/Procedures: none  Follow-Up: 6 months   Any Other Special Instructions Will Be Listed Below (If Applicable).   If you need a refill on your cardiac medications before your next appointment, please call your pharmacy.  

## 2020-08-25 DIAGNOSIS — N184 Chronic kidney disease, stage 4 (severe): Secondary | ICD-10-CM | POA: Diagnosis not present

## 2020-08-25 DIAGNOSIS — K219 Gastro-esophageal reflux disease without esophagitis: Secondary | ICD-10-CM | POA: Diagnosis not present

## 2020-08-25 DIAGNOSIS — I10 Essential (primary) hypertension: Secondary | ICD-10-CM | POA: Diagnosis not present

## 2020-08-25 DIAGNOSIS — M109 Gout, unspecified: Secondary | ICD-10-CM | POA: Diagnosis not present

## 2020-08-25 DIAGNOSIS — I5023 Acute on chronic systolic (congestive) heart failure: Secondary | ICD-10-CM | POA: Diagnosis not present

## 2020-08-25 DIAGNOSIS — Z720 Tobacco use: Secondary | ICD-10-CM | POA: Diagnosis not present

## 2020-08-25 DIAGNOSIS — Z0001 Encounter for general adult medical examination with abnormal findings: Secondary | ICD-10-CM | POA: Diagnosis not present

## 2020-11-30 ENCOUNTER — Ambulatory Visit: Payer: Medicare Other | Admitting: Cardiology

## 2020-11-30 ENCOUNTER — Encounter: Payer: Self-pay | Admitting: Cardiology

## 2020-11-30 VITALS — BP 108/68 | HR 74 | Ht 62.0 in | Wt 127.0 lb

## 2020-11-30 DIAGNOSIS — N1832 Chronic kidney disease, stage 3b: Secondary | ICD-10-CM | POA: Diagnosis not present

## 2020-11-30 DIAGNOSIS — N183 Chronic kidney disease, stage 3 unspecified: Secondary | ICD-10-CM

## 2020-11-30 DIAGNOSIS — I5022 Chronic systolic (congestive) heart failure: Secondary | ICD-10-CM | POA: Diagnosis not present

## 2020-11-30 DIAGNOSIS — I130 Hypertensive heart and chronic kidney disease with heart failure and stage 1 through stage 4 chronic kidney disease, or unspecified chronic kidney disease: Secondary | ICD-10-CM

## 2020-11-30 DIAGNOSIS — I25119 Atherosclerotic heart disease of native coronary artery with unspecified angina pectoris: Secondary | ICD-10-CM | POA: Diagnosis not present

## 2020-11-30 NOTE — Progress Notes (Signed)
Cardiology Office Note  Date: 11/30/2020   ID: [name], DOB 31-Jul-1946, MRN [id number]  PCP:  Celene Squibb, MD  Cardiologist:  Rozann Lesches, MD Electrophysiologist:  None   Chief Complaint  Patient presents with  . Cardiac follow-up    History of Present Illness: [name] is a 75 y.o. male last seen in September 2021 by Mr. Leonides Sake NP.  He is here today for a routine visit.  Still has hospice nursing in place, visits occur on Thursdays.  He has remained remarkably stable however, no obvious chest pain, no orthopnea or leg swelling, weight has been stable as well.  He reports reasonable appetite.  Visit scheduled with Dr. [name] tomorrow.   I reviewed his medications which are outlined below.  No significant changes in cardiac regimen.  ECG today shows sinus rhythm with IVCD and repolarization abnormalities.  Heart rate and blood pressure are well controlled.   Past Medical History:  Diagnosis Date  . CAD (coronary artery disease)    IMI in 5/07 with CTO mid cx, 60% LAD, 99% RCA-> BMS; mod. impaired LV function; EF:35-40% 2/10  . Cardiomyopathy ([place])   . Cirrhosis ([place])    History of excessive alcohol use  . CKD (chronic kidney disease) stage 4, GFR 15-29 ml/min (HCC)   . Dementia ([place])   . Gastroesophageal reflux disease   . Hyperlipidemia     Past Surgical History:  Procedure Laterality Date  . BIOPSY  12/05/2018   Procedure: BIOPSY;  Surgeon: Rogene Houston, MD;  [facility];  Service: Endoscopy;;  gastric  . ESOPHAGOGASTRODUODENOSCOPY N/A 12/19/2016   Procedure: ESOPHAGOGASTRODUODENOSCOPY (EGD);  Surgeon: Rogene Houston, MD;  [facility];  Service: Endoscopy;  Laterality: N/A;  . ESOPHAGOGASTRODUODENOSCOPY N/A 12/21/2016   Procedure: ESOPHAGOGASTRODUODENOSCOPY (EGD);  Surgeon: Danie Binder, MD;  [facility];  Service: Endoscopy;  Laterality: N/A;  . ESOPHAGOGASTRODUODENOSCOPY (EGD) WITH PROPOFOL N/A 12/22/2016    Procedure: ESOPHAGOGASTRODUODENOSCOPY (EGD) WITH PROPOFOL;  Surgeon: Mauri Pole, MD;  Location: [facility];  Service: Endoscopy;  Laterality: N/A;  . ESOPHAGOGASTRODUODENOSCOPY (EGD) WITH PROPOFOL N/A 12/05/2018   Procedure: ESOPHAGOGASTRODUODENOSCOPY (EGD) WITH PROPOFOL;  Surgeon: Rogene Houston, MD;  [facility];  Service: Endoscopy;  Laterality: N/A;  1:35    Current Outpatient Medications  Medication Sig Dispense Refill  . albuterol (VENTOLIN HFA) 108 (90 Base) MCG/ACT inhaler Inhale 2 puffs into the lungs every 4 (four) hours as needed for wheezing or shortness of breath. 18 g 2  . allopurinol (ZYLOPRIM) 100 MG tablet Take 100 mg by mouth daily.    [name] atorvastatin (LIPITOR) 40 MG tablet TAKE 1 TABLET BY MOUTH ONCE DAILY WITH SUPPER. 30 tablet 3  . carvedilol (COREG) 6.25 MG tablet Take 1 tablet (6.25 mg total) by mouth 2 (two) times daily with a meal. 60 tablet 3  . folic acid (FOLVITE) 1 MG tablet Take 1 tablet (1 mg total) by mouth daily. 30 tablet 3  . furosemide (LASIX) 40 MG tablet Take 1 tablet (40 mg total) by mouth daily. 30 tablet 11  . guaiFENesin (MUCINEX) 600 MG 12 hr tablet Take 1 tablet (600 mg total) by mouth 2 (two) times daily. 20 tablet 0  . hydrALAZINE (APRESOLINE) 10 MG tablet Take 1 tablet (10 mg total) by mouth 2 (two) times daily. 60 tablet 5  . isosorbide dinitrate (ISORDIL) 10 MG tablet Take 1 tablet (10 mg total) by mouth 2 (two)  times daily. 60 tablet 5  . Magnesium Oxide 400 (240 Mg) MG TABS Take 1 tablet (400 mg total) by mouth daily. 30 tablet 3  . Multiple Vitamin (DAILY-VITE MULTIVITAMIN) TABS Take 1 tablet by mouth daily.    . Multiple Vitamins-Minerals (MULTIVITAMIN WITH MINERALS) tablet Take 1 tablet by mouth daily. 30 tablet 3  . nitroGLYCERIN (NITROSTAT) 0.4 MG SL tablet Place 0.4 mg under the tongue every 5 (five) minutes as needed for chest pain.      . pantoprazole (PROTONIX) 40 MG tablet TAKE 1 TABLET BY MOUTH ONCE DAILY BEFORE  BREAKFAST. 90 tablet 1  . potassium chloride (KLOR-CON) 10 MEQ tablet Take 1 tablet (10 mEq total) by mouth daily. 30 tablet 3  . spironolactone (ALDACTONE) 25 MG tablet Take 0.5 tablets (12.5 mg total) by mouth daily. 15 tablet 5  . thiamine 100 MG tablet Take 1 tablet (100 mg total) by mouth daily. (Patient taking differently: Take 50 mg by mouth daily.) 30 tablet 2  . tiotropium (SPIRIVA HANDIHALER) 18 MCG inhalation capsule Place 1 capsule (18 mcg total) into inhaler and inhale daily. 30 capsule 5  . vitamin B-12 (CYANOCOBALAMIN) 50 MCG tablet Take 1 tablet (50 mcg total) by mouth daily. 30 tablet 3   No current facility-administered medications for this visit.   Allergies:  Patient has no known allergies.   ROS: No palpitations or unexplained syncope.  Physical Exam: VS:  BP 108/68   Pulse 74   Ht 5\' 2"  (1.575 m)   Wt 127 lb (57.6 kg)   SpO2 98%   BMI 23.23 kg/m , BMI Body mass index is 23.23 kg/m.  Wt Readings from Last 3 Encounters:  11/30/20 127 lb (57.6 kg)  05/30/20 129 lb (58.5 kg)  04/14/20 120 lb (54.4 kg)    General: Elderly male, appears comfortable at rest. HEENT: Conjunctiva and lids normal, wearing a mask. Neck: Supple, no elevated JVP or carotid bruits. Lungs: Clear to auscultation, nonlabored breathing at rest. Cardiac: Regular rate and rhythm, no S3 or significant systolic murmur, no pericardial rub. Extremities: No pitting edema.  ECG:  An ECG dated 01/07/2020 was personally reviewed today and demonstrated:  Sinus tachycardia with left bundle branch block.  Recent Labwork: 01/07/2020: ALT 19; AST 32; B Natriuretic Peptide 1,603.0 01/08/2020: Hemoglobin 10.8; Platelets 179 01/09/2020: BUN 43; Creatinine, Ser 1.83; Potassium 3.7; Sodium 136     Component Value Date/Time   CHOL 102 12/02/2010 1953   TRIG 80 12/02/2010 1953   HDL 34 (L) 12/02/2010 1953   CHOLHDL 3.0 Ratio 12/02/2010 1953   VLDL 16 12/02/2010 1953   LDLCALC 52 12/02/2010 1953  July 2021:  Hemoglobin 11.1, platelets 162, BUN 17, creatinine 1.73, potassium 4.8, AST 26, ALT 17, cholesterol 89, triglycerides 65, HDL 42, LDL 32  Other Studies Reviewed Today:  Echocardiogram 01/07/2020: 1. Left ventricular ejection fraction, by estimation, is 10-15%. The left  ventricle has severely decreased function. The left ventricle demonstrates  global hypokinesis. The left ventricular internal cavity size was mildly  dilated. Left ventricular  diastolic parameters are consistent with Grade I diastolic dysfunction  (impaired relaxation). Elevated left ventricular end-diastolic pressure.  2. Right ventricular systolic function is mildly reduced. The right  ventricular size is normal. There is mildly elevated pulmonary artery  systolic pressure.  3. Left atrial size was severely dilated.  4. The mitral valve is grossly normal. Mild mitral valve regurgitation.  5. The aortic valve is tricuspid. Aortic valve regurgitation is trivial.  No aortic  stenosis is present.  6. The inferior vena cava is dilated in size with <50% respiratory  variability, suggesting right atrial pressure of 15 mmHg.   Assessment and Plan:  1.  Probable mixed cardiomyopathy with LVEF 10 to 15% by assessment last year.  Managed conservatively at the at this time, home hospice nursing still in place although he has remained clinically stable in terms of chronic systolic heart failure symptoms.  No change in weight, reports stable appetite, no orthopnea or leg swelling.  ECG reviewed and stable.  Continue Coreg, hydralazine, Isordil, Aldactone, Lasix and potassium supplement.  Follow-up lab work with Dr. [name] as scheduled.  2.  CKD stage IIIb-IV.  Creatinine was 1.73 with normal potassium by lab work last year.  3.  CAD with old inferior infarct, known occlusion of the mid circumflex, BMS to the RCA in 2007, and moderate residual LAD disease.  No active angina reported on current medications. Continue medical therapy  including statin.  Medication Adjustments/Labs and Tests Ordered: Current medicines are reviewed at length with the patient today.  Concerns regarding medicines are outlined above.   Tests Ordered: Orders Placed This Encounter  Procedures  . EKG 12-Lead    Medication Changes: No orders of the defined types were placed in this encounter.   Disposition:  Follow up 6 months in the [place] office.  Signed, Satira Sark, MD, [facility] 11/30/2020 3:35 PM    [facility] at [place], [place], [place] [address] Phone: [phone]; Fax: [phone]

## 2020-11-30 NOTE — Patient Instructions (Addendum)

## 2020-12-01 DIAGNOSIS — N184 Chronic kidney disease, stage 4 (severe): Secondary | ICD-10-CM | POA: Diagnosis not present

## 2020-12-01 DIAGNOSIS — E782 Mixed hyperlipidemia: Secondary | ICD-10-CM | POA: Diagnosis not present

## 2020-12-01 DIAGNOSIS — M109 Gout, unspecified: Secondary | ICD-10-CM | POA: Diagnosis not present

## 2020-12-01 DIAGNOSIS — K219 Gastro-esophageal reflux disease without esophagitis: Secondary | ICD-10-CM | POA: Diagnosis not present

## 2020-12-01 DIAGNOSIS — I10 Essential (primary) hypertension: Secondary | ICD-10-CM | POA: Diagnosis not present

## 2020-12-01 DIAGNOSIS — I5023 Acute on chronic systolic (congestive) heart failure: Secondary | ICD-10-CM | POA: Diagnosis not present

## 2020-12-01 DIAGNOSIS — Z720 Tobacco use: Secondary | ICD-10-CM | POA: Diagnosis not present

## 2020-12-02 DIAGNOSIS — N184 Chronic kidney disease, stage 4 (severe): Secondary | ICD-10-CM | POA: Diagnosis not present

## 2020-12-02 DIAGNOSIS — R0902 Hypoxemia: Secondary | ICD-10-CM | POA: Diagnosis not present

## 2020-12-02 DIAGNOSIS — I10 Essential (primary) hypertension: Secondary | ICD-10-CM | POA: Diagnosis not present

## 2020-12-02 DIAGNOSIS — E782 Mixed hyperlipidemia: Secondary | ICD-10-CM | POA: Diagnosis not present

## 2020-12-02 DIAGNOSIS — I5023 Acute on chronic systolic (congestive) heart failure: Secondary | ICD-10-CM | POA: Diagnosis not present

## 2020-12-08 DIAGNOSIS — L110 Acquired keratosis follicularis: Secondary | ICD-10-CM | POA: Diagnosis not present

## 2020-12-08 DIAGNOSIS — K219 Gastro-esophageal reflux disease without esophagitis: Secondary | ICD-10-CM | POA: Diagnosis not present

## 2020-12-08 DIAGNOSIS — M79674 Pain in right toe(s): Secondary | ICD-10-CM | POA: Diagnosis not present

## 2020-12-08 DIAGNOSIS — E782 Mixed hyperlipidemia: Secondary | ICD-10-CM | POA: Diagnosis not present

## 2020-12-08 DIAGNOSIS — Z720 Tobacco use: Secondary | ICD-10-CM | POA: Diagnosis not present

## 2020-12-08 DIAGNOSIS — M79672 Pain in left foot: Secondary | ICD-10-CM | POA: Diagnosis not present

## 2020-12-08 DIAGNOSIS — N184 Chronic kidney disease, stage 4 (severe): Secondary | ICD-10-CM | POA: Diagnosis not present

## 2020-12-08 DIAGNOSIS — M79671 Pain in right foot: Secondary | ICD-10-CM | POA: Diagnosis not present

## 2020-12-08 DIAGNOSIS — M79675 Pain in left toe(s): Secondary | ICD-10-CM | POA: Diagnosis not present

## 2020-12-08 DIAGNOSIS — R799 Abnormal finding of blood chemistry, unspecified: Secondary | ICD-10-CM | POA: Diagnosis not present

## 2020-12-08 DIAGNOSIS — M109 Gout, unspecified: Secondary | ICD-10-CM | POA: Diagnosis not present

## 2020-12-08 DIAGNOSIS — I10 Essential (primary) hypertension: Secondary | ICD-10-CM | POA: Diagnosis not present

## 2020-12-08 DIAGNOSIS — I739 Peripheral vascular disease, unspecified: Secondary | ICD-10-CM | POA: Diagnosis not present

## 2020-12-08 DIAGNOSIS — I5023 Acute on chronic systolic (congestive) heart failure: Secondary | ICD-10-CM | POA: Diagnosis not present

## 2021-02-17 DIAGNOSIS — I10 Essential (primary) hypertension: Secondary | ICD-10-CM | POA: Diagnosis not present

## 2021-02-28 DIAGNOSIS — I5022 Chronic systolic (congestive) heart failure: Secondary | ICD-10-CM | POA: Diagnosis not present

## 2021-02-28 DIAGNOSIS — K219 Gastro-esophageal reflux disease without esophagitis: Secondary | ICD-10-CM | POA: Diagnosis not present

## 2021-02-28 DIAGNOSIS — N184 Chronic kidney disease, stage 4 (severe): Secondary | ICD-10-CM | POA: Diagnosis not present

## 2021-02-28 DIAGNOSIS — E782 Mixed hyperlipidemia: Secondary | ICD-10-CM | POA: Diagnosis not present

## 2021-02-28 DIAGNOSIS — Z720 Tobacco use: Secondary | ICD-10-CM | POA: Diagnosis not present

## 2021-02-28 DIAGNOSIS — I10 Essential (primary) hypertension: Secondary | ICD-10-CM | POA: Diagnosis not present

## 2021-02-28 DIAGNOSIS — M109 Gout, unspecified: Secondary | ICD-10-CM | POA: Diagnosis not present

## 2021-04-13 ENCOUNTER — Other Ambulatory Visit: Payer: Self-pay

## 2021-04-13 ENCOUNTER — Ambulatory Visit (INDEPENDENT_AMBULATORY_CARE_PROVIDER_SITE_OTHER): Payer: Medicare Other | Admitting: Gastroenterology

## 2021-04-13 ENCOUNTER — Encounter (INDEPENDENT_AMBULATORY_CARE_PROVIDER_SITE_OTHER): Payer: Self-pay | Admitting: Gastroenterology

## 2021-04-13 VITALS — BP 117/70 | HR 80 | Temp 97.9°F | Ht 62.0 in | Wt 125.0 lb

## 2021-04-13 DIAGNOSIS — K7030 Alcoholic cirrhosis of liver without ascites: Secondary | ICD-10-CM | POA: Diagnosis not present

## 2021-04-13 DIAGNOSIS — K219 Gastro-esophageal reflux disease without esophagitis: Secondary | ICD-10-CM | POA: Diagnosis not present

## 2021-04-13 MED ORDER — PANTOPRAZOLE SODIUM 40 MG PO TBEC: 40.0000 mg | DELAYED_RELEASE_TABLET | Freq: Every day | ORAL | 1 refills | Status: DC

## 2021-04-13 NOTE — Progress Notes (Signed)
[name], M.D. Gastroenterology & Hepatology [facility] [address] [place], [place] [address]  Primary Care Physician: [place] [id number]  I will communicate my assessment and recommendations to the referring MD via EMR.  Problems: GERD Questionable liver cirrhosis  History of Present Illness: [name] is a 75 y.o. male with past medical history of coronary artery disease, cardiomyopathy, questionable alcoholic cirrhosis, CKD, dementia, hyperlipidemia, GERD, who presents for follow up of GERD.  The patient was last seen on 04/14/2020. At that time, the patient was kept on pantoprazole 40 mg every day.  Patient states he is asymptomatic.  Denies having any complaints.  States feeling well reported he has been compliant with his medications.  Denies having any heartburn, odynophagia, dysphagia, any nausea, vomiting, fever, chills, hematochezia, melena, hematemesis, abdominal distention, abdominal pain, diarrhea, jaundice, pruritus or weight loss.  Last Colonoscopy: none prior, declines today.     Last Endoscopy: 11/2018  - Normal esophagus. - Z-line regular, 38 cm from the incisors. - 2 cm hiatal hernia. - Scar in the gastric antrum. - Discolored and nodular mucosa in the gastric body. Biopsied. - Normal duodenal bulb and second portion of the duodenum.   2018-- Normal esophagus. - Non-obstructing non-bleeding gastric ulcer with a visible vessel. NSAID induced etiology. Treated with bipolar cautery. Clip (MR conditional) was placed adjacent to the ulcer for location marking to facilitate visulization on angio or CTA. - Normal examined duodenum. - No specimens collected.  Past Medical History: Past Medical History:  Diagnosis Date   CAD (coronary artery disease)    IMI in 5/07 with CTO mid cx, 60% LAD, 99% RCA-> BMS; mod. impaired LV function; EF:35-40% 2/10    Cardiomyopathy (HCC)    Cirrhosis ([place])    History of excessive alcohol use   CKD (chronic kidney disease) stage 4, GFR 15-29 ml/min (HCC)    Dementia (HCC)    Gastroesophageal reflux disease    Hyperlipidemia     Past Surgical History: Past Surgical History:  Procedure Laterality Date   BIOPSY  12/05/2018   Procedure: BIOPSY;  Surgeon: Rogene Houston, MD;  [facility];  Service: Endoscopy;;  gastric   ESOPHAGOGASTRODUODENOSCOPY N/A 12/19/2016   Procedure: ESOPHAGOGASTRODUODENOSCOPY (EGD);  Surgeon: Rogene Houston, MD;  [facility];  Service: Endoscopy;  Laterality: N/A;   ESOPHAGOGASTRODUODENOSCOPY N/A 12/21/2016   Procedure: ESOPHAGOGASTRODUODENOSCOPY (EGD);  Surgeon: Danie Binder, MD;  [facility];  Service: Endoscopy;  Laterality: N/A;   ESOPHAGOGASTRODUODENOSCOPY (EGD) WITH PROPOFOL N/A 12/22/2016   Procedure: ESOPHAGOGASTRODUODENOSCOPY (EGD) WITH PROPOFOL;  Surgeon: Mauri Pole, MD;  Location: [facility];  Service: Endoscopy;  Laterality: N/A;   ESOPHAGOGASTRODUODENOSCOPY (EGD) WITH PROPOFOL N/A 12/05/2018   Procedure: ESOPHAGOGASTRODUODENOSCOPY (EGD) WITH PROPOFOL;  Surgeon: Rogene Houston, MD;  [facility];  Service: Endoscopy;  Laterality: N/A;  1:35    Family History: Family History  Problem Relation Age of Onset   Other Brother     Social History: Social History   Tobacco Use  Smoking Status Every Day   Packs/day: 0.50   Years: 60.00   Pack years: 30.00   Types: Cigarettes  Smokeless Tobacco Never   Social History   Substance and Sexual Activity  Alcohol Use Not Currently   Comment: per sister "he had one beer last week"   Social History   Substance and Sexual Activity  Drug Use  No    Allergies: No Known Allergies  Medications: Current Outpatient Medications  Medication Sig Dispense Refill   albuterol (VENTOLIN HFA) 108 (90 Base) MCG/ACT inhaler Inhale 2 puffs into the lungs every 4 (four)  hours as needed for wheezing or shortness of breath. 18 g 2   allopurinol (ZYLOPRIM) 100 MG tablet Take 100 mg by mouth daily.     atorvastatin (LIPITOR) 40 MG tablet TAKE 1 TABLET BY MOUTH ONCE DAILY WITH SUPPER. 30 tablet 3   carvedilol (COREG) 6.25 MG tablet Take 1 tablet (6.25 mg total) by mouth 2 (two) times daily with a meal. 60 tablet 3   folic acid (FOLVITE) 1 MG tablet Take 1 tablet (1 mg total) by mouth daily. 30 tablet 3   furosemide (LASIX) 40 MG tablet Take 1 tablet (40 mg total) by mouth daily. 30 tablet 11   guaiFENesin (MUCINEX) 600 MG 12 hr tablet Take 1 tablet (600 mg total) by mouth 2 (two) times daily. 20 tablet 0   hydrALAZINE (APRESOLINE) 10 MG tablet Take 1 tablet (10 mg total) by mouth 2 (two) times daily. 60 tablet 5   isosorbide dinitrate (ISORDIL) 10 MG tablet Take 1 tablet (10 mg total) by mouth 2 (two) times daily. 60 tablet 5   Magnesium Oxide 400 (240 Mg) MG TABS Take 1 tablet (400 mg total) by mouth daily. 30 tablet 3   Multiple Vitamin (DAILY-VITE MULTIVITAMIN) TABS Take 1 tablet by mouth daily.     Multiple Vitamins-Minerals (MULTIVITAMIN WITH MINERALS) tablet Take 1 tablet by mouth daily. 30 tablet 3   nitroGLYCERIN (NITROSTAT) 0.4 MG SL tablet Place 0.4 mg under the tongue every 5 (five) minutes as needed for chest pain.       pantoprazole (PROTONIX) 40 MG tablet TAKE 1 TABLET BY MOUTH ONCE DAILY BEFORE BREAKFAST. 90 tablet 1   potassium chloride (KLOR-CON) 10 MEQ tablet Take 1 tablet (10 mEq total) by mouth daily. 30 tablet 3   spironolactone (ALDACTONE) 25 MG tablet Take 0.5 tablets (12.5 mg total) by mouth daily. 15 tablet 5   thiamine 100 MG tablet Take 1 tablet (100 mg total) by mouth daily. (Patient taking differently: Take 50 mg by mouth daily.) 30 tablet 2   tiotropium (SPIRIVA HANDIHALER) 18 MCG inhalation capsule Place 1 capsule (18 mcg total) into inhaler and inhale daily. 30 capsule 5   vitamin B-12 (CYANOCOBALAMIN) 50 MCG tablet Take 1 tablet (50 mcg  total) by mouth daily. 30 tablet 3   No current facility-administered medications for this visit.    Review of Systems: GENERAL: negative for malaise, night sweats HEENT: No changes in hearing or vision, no nose bleeds or other nasal problems. NECK: Negative for lumps, goiter, pain and significant neck swelling RESPIRATORY: Negative for cough, wheezing CARDIOVASCULAR: Negative for chest pain, leg swelling, palpitations, orthopnea GI: SEE HPI MUSCULOSKELETAL: Negative for joint pain or swelling, back pain, and muscle pain. SKIN: Negative for lesions, rash PSYCH: Negative for sleep disturbance, mood disorder and recent psychosocial stressors. HEMATOLOGY Negative for prolonged bleeding, bruising easily, and swollen nodes. ENDOCRINE: Negative for cold or heat intolerance, polyuria, polydipsia and goiter. NEURO: negative for tremor, gait imbalance, syncope and seizures. The remainder of the review of systems is noncontributory.   Physical Exam: BP 117/70 (BP Location: Right Arm, Patient Position: Sitting, Cuff Size: Small)   Pulse 80   Temp 97.9 F (36.6 C) (Oral)   Ht 5\' 2"  (1.575 m)   Wt 125 lb (56.7 kg)   BMI  22.86 kg/m  GENERAL: The patient is AO x3, in no acute distress. HEENT: Head is normocephalic and atraumatic. EOMI are intact. Mouth is well hydrated and without lesions. NECK: Supple. No masses LUNGS: Clear to auscultation. No presence of rhonchi/wheezing/rales. Adequate chest expansion HEART: RRR, normal s1 and s2. ABDOMEN: Soft, nontender, no guarding, no peritoneal signs, and nondistended. BS +. No masses. EXTREMITIES: Without any cyanosis, clubbing, rash, lesions or edema. NEUROLOGIC: AOx3, no focal motor deficit. SKIN: no jaundice, no rashes  Imaging/Labs: as above  I personally reviewed and interpreted the available labs, imaging and endoscopic files.  Impression and Plan: [name] is a 75 y.o. male with past medical history of coronary artery  disease, cardiomyopathy, questionable alcoholic cirrhosis, CKD, dementia, hyperlipidemia, GERD, who presents for follow up of GERD.  The patient has presented adequate control of his reflux episodes with the use of pantoprazole 40 mg every day.  We will continue for 1 more year with the same dosage and will consider decreasing the dose next year if his symptoms are adequately controlled.  There is a question in his medical chart whether the patient has liver cirrhosis, which is unclear to me.  We will obtain MELD labs and AFP today and obtain a liver elastography to clarify this further.  The patient has not been drinking alcohol anymore and I congratulated him for this.  Finally, I extensively counseled the patient about colorectal cancer screening but he adamantly refused to undergo any sort of testing after thorough discussion.  - Decrease pantoprazole to 40 mg qday - Check MELD labs and AFP  - Schedule liver elastography  All questions were answered.      Harvel Quale, MD Gastroenterology and Hepatology [facility]

## 2021-04-13 NOTE — H&P (View-Only) (Signed)
[name], M.D. Gastroenterology & Hepatology [facility] [address] [place], [place] [address]  Primary Care Physician: Celene Squibb, [place] [id number]  I will communicate my assessment and recommendations to the referring MD via EMR.  Problems: GERD Questionable liver cirrhosis  History of Present Illness: [name] is a 75 y.o. male with past medical history of coronary artery disease, cardiomyopathy, questionable alcoholic cirrhosis, CKD, dementia, hyperlipidemia, GERD, who presents for follow up of GERD.  The patient was last seen on 04/14/2020. At that time, the patient was kept on pantoprazole 40 mg every day.  Patient states he is asymptomatic.  Denies having any complaints.  States feeling well reported he has been compliant with his medications.  Denies having any heartburn, odynophagia, dysphagia, any nausea, vomiting, fever, chills, hematochezia, melena, hematemesis, abdominal distention, abdominal pain, diarrhea, jaundice, pruritus or weight loss.  Last Colonoscopy: none prior, declines today.     Last Endoscopy: 11/2018  - Normal esophagus. - Z-line regular, 38 cm from the incisors. - 2 cm hiatal hernia. - Scar in the gastric antrum. - Discolored and nodular mucosa in the gastric body. Biopsied. - Normal duodenal bulb and second portion of the duodenum.   2018-- Normal esophagus. - Non-obstructing non-bleeding gastric ulcer with a visible vessel. NSAID induced etiology. Treated with bipolar cautery. Clip (MR conditional) was placed adjacent to the ulcer for location marking to facilitate visulization on angio or CTA. - Normal examined duodenum. - No specimens collected.  Past Medical History: Past Medical History:  Diagnosis Date   CAD (coronary artery disease)    IMI in 5/07 with CTO mid cx, 60% LAD, 99% RCA-> BMS; mod. impaired LV function; EF:35-40% 2/10    Cardiomyopathy (HCC)    Cirrhosis ([place])    History of excessive alcohol use   CKD (chronic kidney disease) stage 4, GFR 15-29 ml/min (HCC)    Dementia (HCC)    Gastroesophageal reflux disease    Hyperlipidemia     Past Surgical History: Past Surgical History:  Procedure Laterality Date   BIOPSY  12/05/2018   Procedure: BIOPSY;  Surgeon: Rogene Houston, MD;  [facility];  Service: Endoscopy;;  gastric   ESOPHAGOGASTRODUODENOSCOPY N/A 12/19/2016   Procedure: ESOPHAGOGASTRODUODENOSCOPY (EGD);  Surgeon: Rogene Houston, MD;  [facility];  Service: Endoscopy;  Laterality: N/A;   ESOPHAGOGASTRODUODENOSCOPY N/A 12/21/2016   Procedure: ESOPHAGOGASTRODUODENOSCOPY (EGD);  Surgeon: Danie Binder, MD;  [facility];  Service: Endoscopy;  Laterality: N/A;   ESOPHAGOGASTRODUODENOSCOPY (EGD) WITH PROPOFOL N/A 12/22/2016   Procedure: ESOPHAGOGASTRODUODENOSCOPY (EGD) WITH PROPOFOL;  Surgeon: Mauri Pole, MD;  Location: [facility];  Service: Endoscopy;  Laterality: N/A;   ESOPHAGOGASTRODUODENOSCOPY (EGD) WITH PROPOFOL N/A 12/05/2018   Procedure: ESOPHAGOGASTRODUODENOSCOPY (EGD) WITH PROPOFOL;  Surgeon: Rogene Houston, MD;  [facility];  Service: Endoscopy;  Laterality: N/A;  1:35    Family History: Family History  Problem Relation Age of Onset   Other Brother     Social History: Social History   Tobacco Use  Smoking Status Every Day   Packs/day: 0.50   Years: 60.00   Pack years: 30.00   Types: Cigarettes  Smokeless Tobacco Never   Social History   Substance and Sexual Activity  Alcohol Use Not Currently   Comment: per sister "he had one beer last week"   Social History   Substance and Sexual Activity  Drug Use  No    Allergies: No Known Allergies  Medications: Current Outpatient Medications  Medication Sig Dispense Refill   albuterol (VENTOLIN HFA) 108 (90 Base) MCG/ACT inhaler Inhale 2 puffs into the lungs every 4 (four)  hours as needed for wheezing or shortness of breath. 18 g 2   allopurinol (ZYLOPRIM) 100 MG tablet Take 100 mg by mouth daily.     atorvastatin (LIPITOR) 40 MG tablet TAKE 1 TABLET BY MOUTH ONCE DAILY WITH SUPPER. 30 tablet 3   carvedilol (COREG) 6.25 MG tablet Take 1 tablet (6.25 mg total) by mouth 2 (two) times daily with a meal. 60 tablet 3   folic acid (FOLVITE) 1 MG tablet Take 1 tablet (1 mg total) by mouth daily. 30 tablet 3   furosemide (LASIX) 40 MG tablet Take 1 tablet (40 mg total) by mouth daily. 30 tablet 11   guaiFENesin (MUCINEX) 600 MG 12 hr tablet Take 1 tablet (600 mg total) by mouth 2 (two) times daily. 20 tablet 0   hydrALAZINE (APRESOLINE) 10 MG tablet Take 1 tablet (10 mg total) by mouth 2 (two) times daily. 60 tablet 5   isosorbide dinitrate (ISORDIL) 10 MG tablet Take 1 tablet (10 mg total) by mouth 2 (two) times daily. 60 tablet 5   Magnesium Oxide 400 (240 Mg) MG TABS Take 1 tablet (400 mg total) by mouth daily. 30 tablet 3   Multiple Vitamin (DAILY-VITE MULTIVITAMIN) TABS Take 1 tablet by mouth daily.     Multiple Vitamins-Minerals (MULTIVITAMIN WITH MINERALS) tablet Take 1 tablet by mouth daily. 30 tablet 3   nitroGLYCERIN (NITROSTAT) 0.4 MG SL tablet Place 0.4 mg under the tongue every 5 (five) minutes as needed for chest pain.       pantoprazole (PROTONIX) 40 MG tablet TAKE 1 TABLET BY MOUTH ONCE DAILY BEFORE BREAKFAST. 90 tablet 1   potassium chloride (KLOR-CON) 10 MEQ tablet Take 1 tablet (10 mEq total) by mouth daily. 30 tablet 3   spironolactone (ALDACTONE) 25 MG tablet Take 0.5 tablets (12.5 mg total) by mouth daily. 15 tablet 5   thiamine 100 MG tablet Take 1 tablet (100 mg total) by mouth daily. (Patient taking differently: Take 50 mg by mouth daily.) 30 tablet 2   tiotropium (SPIRIVA HANDIHALER) 18 MCG inhalation capsule Place 1 capsule (18 mcg total) into inhaler and inhale daily. 30 capsule 5   vitamin B-12 (CYANOCOBALAMIN) 50 MCG tablet Take 1 tablet (50 mcg  total) by mouth daily. 30 tablet 3   No current facility-administered medications for this visit.    Review of Systems: GENERAL: negative for malaise, night sweats HEENT: No changes in hearing or vision, no nose bleeds or other nasal problems. NECK: Negative for lumps, goiter, pain and significant neck swelling RESPIRATORY: Negative for cough, wheezing CARDIOVASCULAR: Negative for chest pain, leg swelling, palpitations, orthopnea GI: SEE HPI MUSCULOSKELETAL: Negative for joint pain or swelling, back pain, and muscle pain. SKIN: Negative for lesions, rash PSYCH: Negative for sleep disturbance, mood disorder and recent psychosocial stressors. HEMATOLOGY Negative for prolonged bleeding, bruising easily, and swollen nodes. ENDOCRINE: Negative for cold or heat intolerance, polyuria, polydipsia and goiter. NEURO: negative for tremor, gait imbalance, syncope and seizures. The remainder of the review of systems is noncontributory.   Physical Exam: BP 117/70 (BP Location: Right Arm, Patient Position: Sitting, Cuff Size: Small)   Pulse 80   Temp 97.9 F (36.6 C) (Oral)   Ht 5\' 2"  (1.575 m)   Wt 125 lb (56.7 kg)   BMI  22.86 kg/m  GENERAL: The patient is AO x3, in no acute distress. HEENT: Head is normocephalic and atraumatic. EOMI are intact. Mouth is well hydrated and without lesions. NECK: Supple. No masses LUNGS: Clear to auscultation. No presence of rhonchi/wheezing/rales. Adequate chest expansion HEART: RRR, normal s1 and s2. ABDOMEN: Soft, nontender, no guarding, no peritoneal signs, and nondistended. BS +. No masses. EXTREMITIES: Without any cyanosis, clubbing, rash, lesions or edema. NEUROLOGIC: AOx3, no focal motor deficit. SKIN: no jaundice, no rashes  Imaging/Labs: as above  I personally reviewed and interpreted the available labs, imaging and endoscopic files.  Impression and Plan: [name] is a 75 y.o. male with past medical history of coronary artery  disease, cardiomyopathy, questionable alcoholic cirrhosis, CKD, dementia, hyperlipidemia, GERD, who presents for follow up of GERD.  The patient has presented adequate control of his reflux episodes with the use of pantoprazole 40 mg every day.  We will continue for 1 more year with the same dosage and will consider decreasing the dose next year if his symptoms are adequately controlled.  There is a question in his medical chart whether the patient has liver cirrhosis, which is unclear to me.  We will obtain MELD labs and AFP today and obtain a liver elastography to clarify this further.  The patient has not been drinking alcohol anymore and I congratulated him for this.  Finally, I extensively counseled the patient about colorectal cancer screening but he adamantly refused to undergo any sort of testing after thorough discussion.  - Decrease pantoprazole to 40 mg qday - Check MELD labs and AFP  - Schedule liver elastography  All questions were answered.      Harvel Quale, MD Gastroenterology and Hepatology [facility]

## 2021-04-13 NOTE — Patient Instructions (Addendum)
Decrease pantoprazole to 40 mg qday Perform blood workup Schedule liver elastography

## 2021-04-19 LAB — COMPREHENSIVE METABOLIC PANEL
AG Ratio: 0.7 (calc) — ABNORMAL LOW (ref 1.0–2.5)
ALT: 13 U/L (ref 9–46)
AST: 21 U/L (ref 10–35)
Albumin: 3.6 g/dL (ref 3.6–5.1)
Alkaline phosphatase (APISO): 64 U/L (ref 35–144)
BUN/Creatinine Ratio: 10 (calc) (ref 6–22)
BUN: 18 mg/dL (ref 7–25)
CO2: 26 mmol/L (ref 20–32)
Calcium: 9.5 mg/dL (ref 8.6–10.3)
Chloride: 102 mmol/L (ref 98–110)
Creat: 1.81 mg/dL — ABNORMAL HIGH (ref 0.70–1.28)
Globulin: 5.4 g/dL (calc) — ABNORMAL HIGH (ref 1.9–3.7)
Glucose, Bld: 93 mg/dL (ref 65–99)
Potassium: 4.2 mmol/L (ref 3.5–5.3)
Sodium: 137 mmol/L (ref 135–146)
Total Bilirubin: 0.4 mg/dL (ref 0.2–1.2)
Total Protein: 9 g/dL — ABNORMAL HIGH (ref 6.1–8.1)

## 2021-04-19 LAB — CBC WITH DIFFERENTIAL/PLATELET
Absolute Monocytes: 381 cells/uL (ref 200–950)
Basophils Absolute: 39 cells/uL (ref 0–200)
Basophils Relative: 0.7 %
Eosinophils Absolute: 594 cells/uL — ABNORMAL HIGH (ref 15–500)
Eosinophils Relative: 10.6 %
HCT: 32.8 % — ABNORMAL LOW (ref 38.5–50.0)
Hemoglobin: 11.5 g/dL — ABNORMAL LOW (ref 13.2–17.1)
Lymphs Abs: 2878 cells/uL (ref 850–3900)
MCH: 31.9 pg (ref 27.0–33.0)
MCHC: 35.1 g/dL (ref 32.0–36.0)
MCV: 91.1 fL (ref 80.0–100.0)
MPV: 11.6 fL (ref 7.5–12.5)
Monocytes Relative: 6.8 %
Neutro Abs: 1708 cells/uL (ref 1500–7800)
Neutrophils Relative %: 30.5 %
Platelets: 166 10*3/uL (ref 140–400)
RBC: 3.6 10*6/uL — ABNORMAL LOW (ref 4.20–5.80)
RDW: 14.3 % (ref 11.0–15.0)
Total Lymphocyte: 51.4 %
WBC: 5.6 10*3/uL (ref 3.8–10.8)

## 2021-04-19 LAB — PROTIME-INR
INR: 1
Prothrombin Time: 10.6 s (ref 9.0–11.5)

## 2021-04-19 LAB — AFP TUMOR MARKER: AFP-Tumor Marker: 1.7 ng/mL (ref <6.1)

## 2021-04-24 ENCOUNTER — Ambulatory Visit (HOSPITAL_COMMUNITY)
Admission: RE | Admit: 2021-04-24 | Discharge: 2021-04-24 | Disposition: A | Discharge status: Home / Self Care | Payer: Medicare Other | Source: Ambulatory Visit | Attending: Gastroenterology | Admitting: Gastroenterology

## 2021-04-24 ENCOUNTER — Other Ambulatory Visit: Payer: Self-pay

## 2021-04-24 DIAGNOSIS — K7030 Alcoholic cirrhosis of liver without ascites: Secondary | ICD-10-CM | POA: Diagnosis present

## 2021-04-24 DIAGNOSIS — K7689 Other specified diseases of liver: Secondary | ICD-10-CM | POA: Diagnosis not present

## 2021-04-27 ENCOUNTER — Telehealth (INDEPENDENT_AMBULATORY_CARE_PROVIDER_SITE_OTHER): Payer: Self-pay | Admitting: *Deleted

## 2021-04-27 NOTE — Telephone Encounter (Signed)
Patient call to schedule TCS/EGD per Dr Lurline Del result note from 04/25/21

## 2021-05-03 ENCOUNTER — Encounter (INDEPENDENT_AMBULATORY_CARE_PROVIDER_SITE_OTHER): Payer: Self-pay

## 2021-05-03 ENCOUNTER — Telehealth (INDEPENDENT_AMBULATORY_CARE_PROVIDER_SITE_OTHER): Payer: Self-pay

## 2021-05-03 ENCOUNTER — Other Ambulatory Visit (INDEPENDENT_AMBULATORY_CARE_PROVIDER_SITE_OTHER): Payer: Self-pay

## 2021-05-03 DIAGNOSIS — K7030 Alcoholic cirrhosis of liver without ascites: Secondary | ICD-10-CM

## 2021-05-03 DIAGNOSIS — K219 Gastro-esophageal reflux disease without esophagitis: Secondary | ICD-10-CM

## 2021-05-03 MED ORDER — PEG 3350-KCL-NA BICARB-NACL 420 G PO SOLR: 4000.0000 mL | ORAL | 0 refills | Status: DC

## 2021-05-03 NOTE — Telephone Encounter (Signed)
[name], CMA  

## 2021-05-03 NOTE — Telephone Encounter (Signed)
Thanks

## 2021-05-04 NOTE — Patient Instructions (Signed)
[name]  05/04/2021     @PREFPERIOPPHARMACY @   Your procedure is scheduled on  05/09/2021.   Report to [facility] at  1030 A.M.   Call this number if you have problems the morning of surgery:  [phone]   Remember:  Follow the diet and prep instructions given to you by the office.    Take these medicines the morning of surgery with A SIP OF WATER           allopurinol, carvedilol, isosorbide, protonix.     Do not wear jewelry, make-up or nail polish.  Do not wear lotions, powders, or perfumes, or deodorant.  Do not shave 48 hours prior to surgery.  Men may shave face and neck.  Do not bring valuables to the hospital.  [facility] is not responsible for any belongings or valuables.  Contacts, dentures or bridgework may not be worn into surgery.  Leave your suitcase in the car.  After surgery it may be brought to your room.  For patients admitted to the hospital, discharge time will be determined by your treatment team.  Patients discharged the day of surgery will not be allowed to drive home and must have someone with them for 24 hours.    Special instructions:    DO NOT smoke tobacco or vape for 24 hours before your procedure.  Please read over the following fact sheets that you were given. Anesthesia Post-op Instructions and Care and Recovery After Surgery      Upper Endoscopy, Adult, Care After This sheet gives you information about how to care for yourself after your procedure. Your health care provider may also give you more specific instructions. If you have problems or questions, contact your health careprovider. What can I expect after the procedure? After the procedure, it is common to have: A sore throat. Mild stomach pain or discomfort. Bloating. Nausea. Follow these instructions at home:  Follow instructions from your health care provider about what to eat or drink after your procedure. Return to your normal activities as told  by your health care provider. Ask your health care provider what activities are safe for you. Take over-the-counter and prescription medicines only as told by your health care provider. If you were given a sedative during the procedure, it can affect you for several hours. Do not drive or operate machinery until your health care provider says that it is safe. Keep all follow-up visits as told by your health care provider. This is important. Contact a health care provider if you have: A sore throat that lasts longer than one day. Trouble swallowing. Get help right away if: You vomit blood or your vomit looks like coffee grounds. You have: A fever. Bloody, black, or tarry stools. A severe sore throat or you cannot swallow. Difficulty breathing. Severe pain in your chest or abdomen. Summary After the procedure, it is common to have a sore throat, mild stomach discomfort, bloating, and nausea. If you were given a sedative during the procedure, it can affect you for several hours. Do not drive or operate machinery until your health care provider says that it is safe. Follow instructions from your health care provider about what to eat or drink after your procedure. Return to your normal activities as told by your health care provider. This information is not intended to replace advice given to you by your health care provider. Make sure you discuss any questions you have with  your healthcare provider. Document Revised: 09/01/2019 Document Reviewed: 02/03/2018 Elsevier Patient Education  2022 [place]. Colonoscopy, Adult, Care After This sheet gives you information about how to care for yourself after your procedure. Your health care provider may also give you more specific instructions. If you have problems or questions, contact your health careprovider. What can I expect after the procedure? After the procedure, it is common to have: A small amount of blood in your stool for 24 hours  after the procedure. Some gas. Mild cramping or bloating of your abdomen. Follow these instructions at home: Eating and drinking  Drink enough fluid to keep your urine pale yellow. Follow instructions from your health care provider about eating or drinking restrictions. Resume your normal diet as instructed by your health care provider. Avoid heavy or fried foods that are hard to digest.  Activity Rest as told by your health care provider. Avoid sitting for a long time without moving. Get up to take short walks every 1-2 hours. This is important to improve blood flow and breathing. Ask for help if you feel weak or unsteady. Return to your normal activities as told by your health care provider. Ask your health care provider what activities are safe for you. Managing cramping and bloating  Try walking around when you have cramps or feel bloated. Apply heat to your abdomen as told by your health care provider. Use the heat source that your health care provider recommends, such as a moist heat pack or a heating pad. Place a towel between your skin and the heat source. Leave the heat on for 20-30 minutes. Remove the heat if your skin turns bright red. This is especially important if you are unable to feel pain, heat, or cold. You may have a greater risk of getting burned.  General instructions If you were given a sedative during the procedure, it can affect you for several hours. Do not drive or operate machinery until your health care provider says that it is safe. For the first 24 hours after the procedure: Do not sign important documents. Do not drink alcohol. Do your regular daily activities at a slower pace than normal. Eat soft foods that are easy to digest. Take over-the-counter and prescription medicines only as told by your health care provider. Keep all follow-up visits as told by your health care provider. This is important. Contact a health care provider if: You have blood in  your stool 2-3 days after the procedure. Get help right away if you have: More than a small spotting of blood in your stool. Large blood clots in your stool. Swelling of your abdomen. Nausea or vomiting. A fever. Increasing pain in your abdomen that is not relieved with medicine. Summary After the procedure, it is common to have a small amount of blood in your stool. You may also have mild cramping and bloating of your abdomen. If you were given a sedative during the procedure, it can affect you for several hours. Do not drive or operate machinery until your health care provider says that it is safe. Get help right away if you have a lot of blood in your stool, nausea or vomiting, a fever, or increased pain in your abdomen. This information is not intended to replace advice given to you by your health care provider. Make sure you discuss any questions you have with your healthcare provider. Document Revised: 08/28/2019 Document Reviewed: 03/30/2019 Elsevier Patient Education  [place] After This  sheet gives you information about how to care for yourself after your procedure. Your health care provider may also give you more specific instructions. If you have problems or questions, contact your health careprovider. What can I expect after the procedure? After the procedure, it is common to have: Tiredness. Forgetfulness about what happened after the procedure. Impaired judgment for important decisions. Nausea or vomiting. Some difficulty with balance. Follow these instructions at home: For the time period you were told by your health care provider:     Rest as needed. Do not participate in activities where you could fall or become injured. Do not drive or use machinery. Do not drink alcohol. Do not take sleeping pills or medicines that cause drowsiness. Do not make important decisions or sign legal documents. Do not take care of children on  your own. Eating and drinking Follow the diet that is recommended by your health care provider. Drink enough fluid to keep your urine pale yellow. If you vomit: Drink water, juice, or soup when you can drink without vomiting. Make sure you have little or no nausea before eating solid foods. General instructions Have a responsible adult stay with you for the time you are told. It is important to have someone help care for you until you are awake and alert. Take over-the-counter and prescription medicines only as told by your health care provider. If you have sleep apnea, surgery and certain medicines can increase your risk for breathing problems. Follow instructions from your health care provider about wearing your sleep device: Anytime you are sleeping, including during daytime naps. While taking prescription pain medicines, sleeping medicines, or medicines that make you drowsy. Avoid smoking. Keep all follow-up visits as told by your health care provider. This is important. Contact a health care provider if: You keep feeling nauseous or you keep vomiting. You feel light-headed. You are still sleepy or having trouble with balance after 24 hours. You develop a rash. You have a fever. You have redness or swelling around the IV site. Get help right away if: You have trouble breathing. You have new-onset confusion at home. Summary For several hours after your procedure, you may feel tired. You may also be forgetful and have poor judgment. Have a responsible adult stay with you for the time you are told. It is important to have someone help care for you until you are awake and alert. Rest as told. Do not drive or operate machinery. Do not drink alcohol or take sleeping pills. Get help right away if you have trouble breathing, or if you suddenly become confused. This information is not intended to replace advice given to you by your health care provider. Make sure you discuss any questions  you have with your healthcare provider. Document Revised: 05/19/2020 Document Reviewed: 08/06/2019 Elsevier Patient Education  2022 [employer].

## 2021-05-05 ENCOUNTER — Other Ambulatory Visit: Payer: Self-pay

## 2021-05-05 ENCOUNTER — Encounter (HOSPITAL_COMMUNITY): Payer: Self-pay

## 2021-05-05 ENCOUNTER — Encounter (HOSPITAL_COMMUNITY)
Admission: RE | Admit: 2021-05-05 | Discharge: 2021-05-05 | Disposition: A | Discharge status: Home / Self Care | Payer: Medicare Other | Source: Ambulatory Visit | Attending: Gastroenterology | Admitting: Gastroenterology

## 2021-05-05 DIAGNOSIS — Z01812 Encounter for preprocedural laboratory examination: Secondary | ICD-10-CM | POA: Insufficient documentation

## 2021-05-05 DIAGNOSIS — K7030 Alcoholic cirrhosis of liver without ascites: Secondary | ICD-10-CM | POA: Diagnosis not present

## 2021-05-05 DIAGNOSIS — K219 Gastro-esophageal reflux disease without esophagitis: Secondary | ICD-10-CM | POA: Diagnosis not present

## 2021-05-05 LAB — BASIC METABOLIC PANEL
Anion gap: 8 (ref 5–15)
BUN: 17 mg/dL (ref 8–23)
CO2: 26 mmol/L (ref 22–32)
Calcium: 9.1 mg/dL (ref 8.9–10.3)
Chloride: 101 mmol/L (ref 98–111)
Creatinine, Ser: 1.82 mg/dL — ABNORMAL HIGH (ref 0.61–1.24)
GFR, Estimated: 38 mL/min — ABNORMAL LOW (ref >60)
Glucose, Bld: 120 mg/dL — ABNORMAL HIGH (ref 70–99)
Potassium: 3.9 mmol/L (ref 3.5–5.1)
Sodium: 135 mmol/L (ref 135–145)

## 2021-05-09 ENCOUNTER — Ambulatory Visit (HOSPITAL_COMMUNITY): Payer: Medicare Other | Admitting: Anesthesiology

## 2021-05-09 ENCOUNTER — Other Ambulatory Visit: Payer: Self-pay

## 2021-05-09 ENCOUNTER — Ambulatory Visit (HOSPITAL_COMMUNITY)
Admission: RE | Admit: 2021-05-09 | Discharge: 2021-05-09 | Disposition: A | Discharge status: Home / Self Care | Payer: Medicare Other | Attending: Gastroenterology | Admitting: Gastroenterology

## 2021-05-09 ENCOUNTER — Encounter (HOSPITAL_COMMUNITY): Payer: Self-pay | Admitting: Gastroenterology

## 2021-05-09 ENCOUNTER — Encounter (HOSPITAL_COMMUNITY)
Admission: RE | Disposition: A | Discharge status: Home / Self Care | Payer: Self-pay | Source: Home / Self Care | Attending: Gastroenterology

## 2021-05-09 DIAGNOSIS — F0390 Unspecified dementia without behavioral disturbance: Secondary | ICD-10-CM | POA: Insufficient documentation

## 2021-05-09 DIAGNOSIS — K7030 Alcoholic cirrhosis of liver without ascites: Secondary | ICD-10-CM

## 2021-05-09 DIAGNOSIS — K648 Other hemorrhoids: Secondary | ICD-10-CM | POA: Diagnosis not present

## 2021-05-09 DIAGNOSIS — I429 Cardiomyopathy, unspecified: Secondary | ICD-10-CM | POA: Diagnosis not present

## 2021-05-09 DIAGNOSIS — F17210 Nicotine dependence, cigarettes, uncomplicated: Secondary | ICD-10-CM | POA: Diagnosis not present

## 2021-05-09 DIAGNOSIS — D123 Benign neoplasm of transverse colon: Secondary | ICD-10-CM | POA: Diagnosis not present

## 2021-05-09 DIAGNOSIS — Z79899 Other long term (current) drug therapy: Secondary | ICD-10-CM | POA: Insufficient documentation

## 2021-05-09 DIAGNOSIS — I509 Heart failure, unspecified: Secondary | ICD-10-CM | POA: Insufficient documentation

## 2021-05-09 DIAGNOSIS — K449 Diaphragmatic hernia without obstruction or gangrene: Secondary | ICD-10-CM | POA: Diagnosis not present

## 2021-05-09 DIAGNOSIS — N184 Chronic kidney disease, stage 4 (severe): Secondary | ICD-10-CM | POA: Diagnosis not present

## 2021-05-09 DIAGNOSIS — I2510 Atherosclerotic heart disease of native coronary artery without angina pectoris: Secondary | ICD-10-CM | POA: Insufficient documentation

## 2021-05-09 DIAGNOSIS — K219 Gastro-esophageal reflux disease without esophagitis: Secondary | ICD-10-CM | POA: Diagnosis not present

## 2021-05-09 DIAGNOSIS — D649 Anemia, unspecified: Secondary | ICD-10-CM

## 2021-05-09 DIAGNOSIS — D120 Benign neoplasm of cecum: Secondary | ICD-10-CM

## 2021-05-09 DIAGNOSIS — Z955 Presence of coronary angioplasty implant and graft: Secondary | ICD-10-CM | POA: Insufficient documentation

## 2021-05-09 HISTORY — PX: ESOPHAGOGASTRODUODENOSCOPY (EGD) WITH PROPOFOL: SHX5813

## 2021-05-09 HISTORY — PX: POLYPECTOMY: SHX5525

## 2021-05-09 HISTORY — PX: BIOPSY: SHX5522

## 2021-05-09 HISTORY — PX: COLONOSCOPY WITH PROPOFOL: SHX5780

## 2021-05-09 LAB — HM COLONOSCOPY

## 2021-05-09 LAB — IRON AND TIBC
Iron: 52 ug/dL (ref 45–182)
Saturation Ratios: 24 % (ref 17.9–39.5)
TIBC: 218 ug/dL — ABNORMAL LOW (ref 250–450)
UIBC: 166 ug/dL

## 2021-05-09 LAB — FERRITIN: Ferritin: 38 ng/mL (ref 24–336)

## 2021-05-09 SURGERY — COLONOSCOPY WITH PROPOFOL
Anesthesia: General

## 2021-05-09 MED ORDER — PROPOFOL 500 MG/50ML IV EMUL
INTRAVENOUS | Status: DC | PRN
  Administered 2021-05-09: 50 ug/kg/min via INTRAVENOUS

## 2021-05-09 MED ORDER — EPHEDRINE 5 MG/ML INJ
INTRAVENOUS | Status: AC
  Filled 2021-05-09: qty 5

## 2021-05-09 MED ORDER — EPHEDRINE SULFATE 50 MG/ML IJ SOLN
INTRAMUSCULAR | Status: DC | PRN
  Administered 2021-05-09 (×3): 5 mg via INTRAVENOUS

## 2021-05-09 MED ORDER — PROPOFOL 10 MG/ML IV BOLUS
INTRAVENOUS | Status: DC | PRN
  Administered 2021-05-09: 20 mg via INTRAVENOUS
  Administered 2021-05-09 (×3): 30 mg via INTRAVENOUS

## 2021-05-09 MED ORDER — KETAMINE HCL 50 MG/5ML IJ SOSY
PREFILLED_SYRINGE | INTRAMUSCULAR | Status: AC
  Filled 2021-05-09: qty 5

## 2021-05-09 MED ORDER — PHENYLEPHRINE 40 MCG/ML (10ML) SYRINGE FOR IV PUSH (FOR BLOOD PRESSURE SUPPORT)
PREFILLED_SYRINGE | INTRAVENOUS | Status: AC
  Filled 2021-05-09: qty 10

## 2021-05-09 MED ORDER — PHENYLEPHRINE 40 MCG/ML (10ML) SYRINGE FOR IV PUSH (FOR BLOOD PRESSURE SUPPORT)
PREFILLED_SYRINGE | INTRAVENOUS | Status: DC | PRN
  Administered 2021-05-09: 120 ug via INTRAVENOUS
  Administered 2021-05-09 (×2): 80 ug via INTRAVENOUS
  Administered 2021-05-09: 120 ug via INTRAVENOUS

## 2021-05-09 MED ORDER — STERILE WATER FOR IRRIGATION IR SOLN
Status: DC | PRN
  Administered 2021-05-09: 300 mL

## 2021-05-09 MED ORDER — LIDOCAINE HCL (CARDIAC) PF 100 MG/5ML IV SOSY
PREFILLED_SYRINGE | INTRAVENOUS | Status: DC | PRN
  Administered 2021-05-09: 50 mg via INTRAVENOUS

## 2021-05-09 MED ORDER — VASOPRESSIN 20 UNIT/ML IV SOLN
INTRAVENOUS | Status: AC
  Filled 2021-05-09: qty 1

## 2021-05-09 MED ORDER — LACTATED RINGERS IV SOLN: INTRAVENOUS | Status: DC

## 2021-05-09 NOTE — Op Note (Signed)
[facility] Patient Name: [name] Procedure Date: 05/09/2021 11:12 AM MRN: [id number] Date of Birth: [birth year] Attending MD: [name] ,  CSN: [id number] Age: 75 Admit Type: Outpatient Procedure:                Upper GI endoscopy Indications:              Anemia Providers:                [name], Lambert Mody, Kristine L.                            Risa Grill, Technician Referring MD:              Medicines:                Monitored Anesthesia Care Complications:            No immediate complications. Estimated Blood Loss:     Estimated blood loss: none. Procedure:                Pre-Anesthesia Assessment:                           - Prior to the procedure, a History and Physical                            was performed, and patient medications, allergies                            and sensitivities were reviewed. The patient's                            tolerance of previous anesthesia was reviewed.                           - The risks and benefits of the procedure and the                            sedation options and risks were discussed with the                            patient. All questions were answered and informed                            consent was obtained.                           - ASA Grade Assessment: III - A patient with severe                            systemic disease.                           After obtaining informed consent, the endoscope was                            passed under direct vision. Throughout the  procedure, the patient's blood pressure, pulse, and                            oxygen saturations were monitored continuously. The                            GIF-H190 ([id number]) scope was introduced through the                            mouth, and advanced to the second part of duodenum.                            The upper GI endoscopy was accomplished without                             difficulty. The patient tolerated the procedure                            well. Scope In: 11:30:49 AM Scope Out: 11:34:37 AM Total Procedure Duration: 0 hours 3 minutes 48 seconds  Findings:      A 2 cm hiatal hernia was present.      The entire examined stomach was normal.      The examined duodenum was normal. Biopsies were taken with a cold       forceps for histology. Impression:               - 2 cm hiatal hernia.                           - Normal stomach.                           - Normal examined duodenum. Biopsied. Moderate Sedation:      Per Anesthesia Care Recommendation:           - Discharge patient to home (ambulatory).                           - Resume previous diet.                           - Await pathology results. Procedure Code(s):        --- Professional ---                           [phone], Esophagogastroduodenoscopy, flexible,                            transoral; with biopsy, single or multiple Diagnosis Code(s):        --- Professional ---                           K44.9, Diaphragmatic hernia without obstruction or                            gangrene  D64.9, Anemia, unspecified CPT copyright 2019 American Medical Association. All rights reserved. The codes documented in this report are preliminary and upon coder review may  be revised to meet current compliance requirements. [name], MD [name],  05/09/2021 11:37:22 AM This report has been signed electronically. Number of Addenda: 0

## 2021-05-09 NOTE — Anesthesia Postprocedure Evaluation (Signed)
Anesthesia Post Note  Patient: [name]  Procedure(s) Performed: COLONOSCOPY WITH PROPOFOL ESOPHAGOGASTRODUODENOSCOPY (EGD) WITH PROPOFOL BIOPSY POLYPECTOMY  Patient location during evaluation: Phase II Anesthesia Type: General Level of consciousness: awake and alert and oriented Pain management: pain level controlled Vital Signs Assessment: post-procedure vital signs reviewed and stable Respiratory status: spontaneous breathing and respiratory function stable Cardiovascular status: blood pressure returned to baseline and stable Postop Assessment: no apparent nausea or vomiting Anesthetic complications: no   No notable events documented.   Last Vitals:  Vitals:   05/09/21 1117 05/09/21 1216  BP: 105/61 (!) 99/44  Pulse:  69  Resp:  17  Temp:  36.7 C  SpO2: 99% 98%    Last Pain:  Vitals:   05/09/21 1220  TempSrc:   PainSc: 0-No pain                 Shermar Friedland C Rose-Marie Hickling

## 2021-05-09 NOTE — Transfer of Care (Signed)
Immediate Anesthesia Transfer of Care Note  Patient: [name]  Procedure(s) Performed: COLONOSCOPY WITH PROPOFOL ESOPHAGOGASTRODUODENOSCOPY (EGD) WITH PROPOFOL BIOPSY POLYPECTOMY  Patient Location: Short Stay  Anesthesia Type:General  Level of Consciousness: awake  Airway & Oxygen Therapy: Patient Spontanous Breathing  Post-op Assessment: Report given to RN and Post -op Vital signs reviewed and stable  Post vital signs: Reviewed and stable  Last Vitals:  Vitals Value Taken Time  BP 99/44 05/09/21 1216  Temp 36.7 C 05/09/21 1216  Pulse 69 05/09/21 1216  Resp 17 05/09/21 1216  SpO2 98 % 05/09/21 1216    Last Pain:  Vitals:   05/09/21 1216  TempSrc: Axillary  PainSc: 0-No pain      Patients Stated Pain Goal: 3 (37/34/28 7681)  Complications: No notable events documented.

## 2021-05-09 NOTE — Anesthesia Preprocedure Evaluation (Signed)
Anesthesia Evaluation  Patient identified by MRN, date of birth, ID band Patient awake    Reviewed: Allergy & Precautions, NPO status , Patient's Chart, lab work & pertinent test results  Airway Mallampati: II  TM Distance: >3 FB     Dental  (+) Dental Advisory Given, Missing, Chipped, Edentulous Upper   Pulmonary Current Smoker,    Pulmonary exam normal breath sounds clear to auscultation       Cardiovascular Exercise Tolerance: Good + CAD, + Cardiac Stents and +CHF   Rhythm:Irregular Rate:Normal - Systolic murmurs, - Diastolic murmurs, - Friction Rub, - Carotid Bruit, - Peripheral Edema and - Systolic Click 1. Left ventricular ejection fraction, by estimation, is 10-15%. The left ventricle has severely decreased function. The left ventricle demonstrates  global hypokinesis. The left ventricular internal cavity size was mildly dilated. Left ventricular  diastolic parameters are consistent with Grade I diastolic dysfunction (impaired relaxation). Elevated left ventricular end-diastolic pressure.  2. Right ventricular systolic function is mildly reduced. The right  ventricular size is normal. There is mildly elevated pulmonary artery  systolic pressure.  3. Left atrial size was severely dilated.  4. The mitral valve is grossly normal. Mild mitral valve regurgitation.  5. The aortic valve is tricuspid. Aortic valve regurgitation is trivial. No aortic stenosis is present.  6. The inferior vena cava is dilated in size with <50% respiratory variability, suggesting right atrial pressure of 15 mmHg.    Neuro/Psych PSYCHIATRIC DISORDERS Dementia    GI/Hepatic PUD, GERD  ,  Endo/Other    Renal/GU Renal InsufficiencyRenal disease     Musculoskeletal  (+) Arthritis ,   Abdominal   Peds  Hematology  (+) anemia ,   Anesthesia Other Findings 07-Jan-2020 06:04:25 [facility]-ER ROUTINE RECORD Sinus  tachycardia LVH with secondary repolarization abnormality Anterior Q waves, possibly due to LVH Since last tracing rate faster 08 Jun 2019 Confirmed by Rolland Porter [phone]) on 01/07/2020 6:06:26 AM  Reproductive/Obstetrics                            Anesthesia Physical Anesthesia Plan  ASA: 4  Anesthesia Plan: General   Post-op Pain Management:    Induction: Intravenous  PONV Risk Score and Plan: TIVA  Airway Management Planned: Nasal Cannula and Natural Airway  Additional Equipment:   Intra-op Plan:   Post-operative Plan:   Informed Consent: I have reviewed the patients History and Physical, chart, labs and discussed the procedure including the risks, benefits and alternatives for the proposed anesthesia with the patient or authorized representative who has indicated his/her understanding and acceptance.     Dental advisory given  Plan Discussed with: CRNA and Surgeon  Anesthesia Plan Comments:        Anesthesia Quick Evaluation

## 2021-05-09 NOTE — Discharge Instructions (Addendum)
You are being discharged to home.  Resume your previous diet.  We are waiting for your pathology results.  Your physician has indicated that a repeat colonoscopy is not recommended due to your current age (60 years or older) for screening purposes.

## 2021-05-09 NOTE — Interval H&P Note (Signed)
History and Physical Interval Note:  05/09/2021 11:17 AM [name] is a 75 y.o. male with past medical history of coronary artery disease, cardiomyopathy, questionable alcoholic cirrhosis, CKD, dementia, hyperlipidemia, GERD, who presents for evaluation of iron deficiency anemia.  Patient denies having any complaints.  States that he has not noticed any melena, hematochezia, abdominal pain, distention, fever, chills, nausea or vomiting. During his blood work-up he was found to have anemia at least since 2021 which has fluctuated, his most recent hemoglobin was 11.5 on 04/18/2021.  BP 105/61   Pulse 76   Temp 98.5 F (36.9 C) (Oral)   Resp 19   Ht 5\' 7"  (1.702 m)   Wt 56.7 kg   SpO2 99%   BMI 19.58 kg/m  GENERAL: The patient is AO x3, in no acute distress. HEENT: Head is normocephalic and atraumatic. EOMI are intact. Mouth is well hydrated and without lesions. NECK: Supple. No masses LUNGS: Clear to auscultation. No presence of rhonchi/wheezing/rales. Adequate chest expansion HEART: RRR, normal s1 and s2. ABDOMEN: Soft, nontender, no guarding, no peritoneal signs, and nondistended. BS +. No masses. EXTREMITIES: Without any cyanosis, clubbing, rash, lesions or edema. NEUROLOGIC: AOx3, no focal motor deficit. SKIN: no jaundice, no rashes  [name]  has presented today for surgery, with the diagnosis of Alcholic Cirrhosis Chronic GERD.  The various methods of treatment have been discussed with the patient and family. After consideration of risks, benefits and other options for treatment, the patient has consented to  Procedure(s) with comments: COLONOSCOPY WITH PROPOFOL (N/A) - 12:30 ESOPHAGOGASTRODUODENOSCOPY (EGD) WITH PROPOFOL (N/A) as a surgical intervention.  The patient's history has been reviewed, patient examined, no change in status, stable for surgery.  I have reviewed the patient's chart and labs.  Questions were answered to the patient's satisfaction.      [name]

## 2021-05-09 NOTE — Anesthesia Procedure Notes (Signed)
Date/Time: 05/09/2021 11:58 AM Performed by: Orlie Dakin, CRNA Pre-anesthesia Checklist: Patient identified, Emergency Drugs available, Suction available and Patient being monitored Patient Re-evaluated:Patient Re-evaluated prior to induction Oxygen Delivery Method: Nasal cannula Induction Type: IV induction Placement Confirmation: positive ETCO2

## 2021-05-09 NOTE — Op Note (Signed)
[facility] Patient Name: [name] Procedure Date: 05/09/2021 11:36 AM MRN: [id number] Date of Birth: 1945/11/09 Attending MD: [name] ,  CSN: [id number] Age: 75 Admit Type: Outpatient Procedure:                Colonoscopy Indications:              Anemia Providers:                [name], Lambert Mody, Vista Mink, Technician Referring MD:              Medicines:                Monitored Anesthesia Care Complications:            No immediate complications. Estimated Blood Loss:     Estimated blood loss: none. Procedure:                Pre-Anesthesia Assessment:                           - Prior to the procedure, a History and Physical                            was performed, and patient medications, allergies                            and sensitivities were reviewed. The patient's                            tolerance of previous anesthesia was reviewed.                           - The risks and benefits of the procedure and the                            sedation options and risks were discussed with the                            patient. All questions were answered and informed                            consent was obtained.                           - ASA Grade Assessment: III - A patient with severe                            systemic disease.                           After obtaining informed consent, the colonoscope                            was passed under direct vision. Throughout the  procedure, the patient's blood pressure, pulse, and                            oxygen saturations were monitored continuously. The                            [id number]-HQ190L ([id number]) scope was introduced through                            the anus and advanced to the the cecum, identified                            by appendiceal orifice and ileocecal valve. The                             colonoscopy was performed without difficulty. The                            patient tolerated the procedure well. The quality                            of the bowel preparation was good. Scope In: 11:38:55 AM Scope Out: 12:11:15 PM Scope Withdrawal Time: 0 hours 23 minutes 2 seconds  Total Procedure Duration: 0 hours 32 minutes 20 seconds  Findings:      The perianal and digital rectal examinations were normal.      A 5 mm polyp was found in the cecum. The polyp was sessile. The polyp       was removed with a cold snare. Resection and retrieval were complete.      Five sessile polyps were found in the transverse colon. The polyps were       3 to 6 mm in size. These polyps were removed with a cold snare.       Resection and retrieval were complete.      Non-bleeding internal hemorrhoids were found during retroflexion. The       hemorrhoids were small. Impression:               - One 5 mm polyp in the cecum, removed with a cold                            snare. Resected and retrieved.                           - Five 3 to 6 mm polyps in the transverse colon,                            removed with a cold snare. Resected and retrieved.                           - Non-bleeding internal hemorrhoids. Moderate Sedation:      Per Anesthesia Care Recommendation:           - Discharge patient to home (ambulatory).                           -  Resume previous diet.                           - Await pathology results.                           - Check iron stores today.                           - Repeat colonoscopy is not recommended due to                            current age (47 years or older) for screening                            purposes. Procedure Code(s):        --- Professional ---                           [id number], Colonoscopy, flexible; with removal of                            tumor(s), polyp(s), or other lesion(s) by snare                            technique Diagnosis  Code(s):        --- Professional ---                           K63.5, Polyp of colon                           K64.8, Other hemorrhoids                           D64.9, Anemia, unspecified CPT copyright 2019 American Medical Association. All rights reserved. The codes documented in this report are preliminary and upon coder review may  be revised to meet current compliance requirements. [name], MD [name],  05/09/2021 12:15:49 PM This report has been signed electronically. Number of Addenda: 0

## 2021-05-10 ENCOUNTER — Encounter (INDEPENDENT_AMBULATORY_CARE_PROVIDER_SITE_OTHER): Payer: Self-pay | Admitting: *Deleted

## 2021-05-10 LAB — SURGICAL PATHOLOGY

## 2021-05-17 ENCOUNTER — Encounter (HOSPITAL_COMMUNITY): Payer: Self-pay | Admitting: Gastroenterology

## 2021-05-26 DIAGNOSIS — I10 Essential (primary) hypertension: Secondary | ICD-10-CM | POA: Diagnosis not present

## 2021-05-31 DIAGNOSIS — K219 Gastro-esophageal reflux disease without esophagitis: Secondary | ICD-10-CM | POA: Diagnosis not present

## 2021-05-31 DIAGNOSIS — E782 Mixed hyperlipidemia: Secondary | ICD-10-CM | POA: Diagnosis not present

## 2021-05-31 DIAGNOSIS — Z720 Tobacco use: Secondary | ICD-10-CM | POA: Diagnosis not present

## 2021-05-31 DIAGNOSIS — M109 Gout, unspecified: Secondary | ICD-10-CM | POA: Diagnosis not present

## 2021-05-31 DIAGNOSIS — D631 Anemia in chronic kidney disease: Secondary | ICD-10-CM | POA: Diagnosis not present

## 2021-05-31 DIAGNOSIS — Z0001 Encounter for general adult medical examination with abnormal findings: Secondary | ICD-10-CM | POA: Diagnosis not present

## 2021-05-31 DIAGNOSIS — I10 Essential (primary) hypertension: Secondary | ICD-10-CM | POA: Diagnosis not present

## 2021-05-31 DIAGNOSIS — I5022 Chronic systolic (congestive) heart failure: Secondary | ICD-10-CM | POA: Diagnosis not present

## 2021-05-31 DIAGNOSIS — N184 Chronic kidney disease, stage 4 (severe): Secondary | ICD-10-CM | POA: Diagnosis not present

## 2021-06-05 ENCOUNTER — Other Ambulatory Visit: Payer: Self-pay

## 2021-06-05 ENCOUNTER — Encounter: Payer: Self-pay | Admitting: Cardiology

## 2021-06-05 ENCOUNTER — Ambulatory Visit (INDEPENDENT_AMBULATORY_CARE_PROVIDER_SITE_OTHER): Payer: Medicare Other | Admitting: Cardiology

## 2021-06-05 VITALS — BP 112/58 | HR 80 | Ht 66.0 in | Wt 128.0 lb

## 2021-06-05 DIAGNOSIS — N1832 Chronic kidney disease, stage 3b: Secondary | ICD-10-CM

## 2021-06-05 DIAGNOSIS — I5022 Chronic systolic (congestive) heart failure: Secondary | ICD-10-CM

## 2021-06-05 DIAGNOSIS — I25119 Atherosclerotic heart disease of native coronary artery with unspecified angina pectoris: Secondary | ICD-10-CM | POA: Diagnosis not present

## 2021-06-05 NOTE — Patient Instructions (Signed)
Medication Instructions:  Continue all current medications.   Labwork: none  Testing/Procedures: none  Follow-Up: 6 months   Any Other Special Instructions Will Be Listed Below (If Applicable).   If you need a refill on your cardiac medications before your next appointment, please call your pharmacy.  

## 2021-06-05 NOTE — Progress Notes (Signed)
Cardiology Office Note  Date: 06/05/2021   ID: [name], DOB 10-Nov-1945, MRN [id number]  PCP:  Celene Squibb, MD  Cardiologist:  Rozann Lesches, MD Electrophysiologist:  None   Chief Complaint  Patient presents with   Cardiac follow-up    History of Present Illness: [name] is a 75 y.o. male last seen in March.  He is here today with his sister for a follow-up visit.  He remained stable from a cardiac perspective, no reported angina symptoms, no dizziness or syncope, no leg swelling or unusual weight gain.  He reports good appetite.  I reviewed his medications which are stable from a cardiac perspective as outlined below.  He did have follow-up lab work in August at which point creatinine was 1.82 with potassium 3.9. We continue conservative management of his cardiomyopathy.  Past Medical History:  Diagnosis Date   CAD (coronary artery disease)    IMI in 5/07 with CTO mid cx, 60% LAD, 99% RCA-> BMS; mod. impaired LV function; EF:35-40% 2/10   Cardiomyopathy ([place])    Cirrhosis ([place])    History of excessive alcohol use   CKD (chronic kidney disease) stage 4, GFR 15-29 ml/min (HCC)    Dementia ([place])    Gastroesophageal reflux disease    Hyperlipidemia     Past Surgical History:  Procedure Laterality Date   BIOPSY  12/05/2018   Procedure: BIOPSY;  Surgeon: Rogene Houston, MD;  [facility];  Service: Endoscopy;;  gastric   BIOPSY  05/09/2021   Procedure: BIOPSY;  Surgeon: Harvel Quale, MD;  [facility];  Service: Gastroenterology;;   COLONOSCOPY WITH PROPOFOL N/A 05/09/2021   Procedure: COLONOSCOPY WITH PROPOFOL;  Surgeon: Harvel Quale, MD;  [facility];  Service: Gastroenterology;  Laterality: N/A;  12:30   ESOPHAGOGASTRODUODENOSCOPY N/A 12/19/2016   Procedure: ESOPHAGOGASTRODUODENOSCOPY (EGD);  Surgeon: Rogene Houston, MD;  [facility];  Service: Endoscopy;  Laterality: N/A;    ESOPHAGOGASTRODUODENOSCOPY N/A 12/21/2016   Procedure: ESOPHAGOGASTRODUODENOSCOPY (EGD);  Surgeon: Danie Binder, MD;  [facility];  Service: Endoscopy;  Laterality: N/A;   ESOPHAGOGASTRODUODENOSCOPY (EGD) WITH PROPOFOL N/A 12/22/2016   Procedure: ESOPHAGOGASTRODUODENOSCOPY (EGD) WITH PROPOFOL;  Surgeon: Mauri Pole, MD;  Location: [facility];  Service: Endoscopy;  Laterality: N/A;   ESOPHAGOGASTRODUODENOSCOPY (EGD) WITH PROPOFOL N/A 12/05/2018   Procedure: ESOPHAGOGASTRODUODENOSCOPY (EGD) WITH PROPOFOL;  Surgeon: Rogene Houston, MD;  [facility];  Service: Endoscopy;  Laterality: N/A;  1:35   ESOPHAGOGASTRODUODENOSCOPY (EGD) WITH PROPOFOL N/A 05/09/2021   Procedure: ESOPHAGOGASTRODUODENOSCOPY (EGD) WITH PROPOFOL;  Surgeon: Harvel Quale, MD;  [facility];  Service: Gastroenterology;  Laterality: N/A;   POLYPECTOMY  05/09/2021   Procedure: POLYPECTOMY;  Surgeon: Montez Morita, Quillian Quince, MD;  [facility];  Service: Gastroenterology;;    Current Outpatient Medications  Medication Sig Dispense Refill   allopurinol (ZYLOPRIM) 100 MG tablet Take 100 mg by mouth daily.     atorvastatin (LIPITOR) 40 MG tablet TAKE 1 TABLET BY MOUTH ONCE DAILY WITH SUPPER. 30 tablet 3   carvedilol (COREG) 6.25 MG tablet Take 1 tablet (6.25 mg total) by mouth 2 (two) times daily with a meal. 60 tablet 3   folic acid (FOLVITE) 1 MG tablet Take 1 tablet (1 mg total) by mouth daily. 30 tablet 3   furosemide (LASIX) 40 MG tablet Take 1 tablet (40 mg total) by mouth daily. 30 tablet 11  hydrALAZINE (APRESOLINE) 10 MG tablet Take 1 tablet (10 mg total) by mouth 2 (two) times daily. 60 tablet 5   isosorbide dinitrate (ISORDIL) 10 MG tablet Take 1 tablet (10 mg total) by mouth 2 (two) times daily. 60 tablet 5   Magnesium Oxide 400 (240 Mg) MG TABS Take 1 tablet (400 mg total) by mouth daily. 30 tablet 3   Multiple Vitamin (DAILY-VITE MULTIVITAMIN) TABS Take 1  tablet by mouth daily.     Multiple Vitamins-Minerals (MULTIVITAMIN WITH MINERALS) tablet Take 1 tablet by mouth daily. 30 tablet 3   nitroGLYCERIN (NITROSTAT) 0.4 MG SL tablet Place 0.4 mg under the tongue every 5 (five) minutes as needed for chest pain.       pantoprazole (PROTONIX) 40 MG tablet Take 1 tablet (40 mg total) by mouth daily before breakfast. 90 tablet 1   potassium chloride (KLOR-CON) 10 MEQ tablet Take 1 tablet (10 mEq total) by mouth daily. 30 tablet 3   potassium chloride (KLOR-CON) 10 MEQ tablet Take 10 mEq by mouth daily.     senna-docusate (SENOKOT-S) 8.6-50 MG tablet Take 1 tablet by mouth 2 (two) times daily.     spironolactone (ALDACTONE) 25 MG tablet Take 0.5 tablets (12.5 mg total) by mouth daily. 15 tablet 5   tiotropium (SPIRIVA HANDIHALER) 18 MCG inhalation capsule Place 1 capsule (18 mcg total) into inhaler and inhale daily. 30 capsule 5   vitamin B-12 (CYANOCOBALAMIN) 100 MCG tablet Take by mouth.     No current facility-administered medications for this visit.   Allergies:  Patient has no known allergies.   ROS: Uses a cane.  No syncope.  Physical Exam: VS:  BP (!) 112/58 (BP Location: Left Arm, Patient Position: Sitting, Cuff Size: Normal)   Pulse 80   Ht 5\' 6"  (1.676 m)   Wt 128 lb (58.1 kg)   SpO2 98%   BMI 20.66 kg/m , BMI Body mass index is 20.66 kg/m.  Wt Readings from Last 3 Encounters:  06/05/21 128 lb (58.1 kg)  05/09/21 125 lb (56.7 kg)  04/13/21 125 lb (56.7 kg)    General: Elderly male, appears comfortable at rest. HEENT: Conjunctiva and lids normal, wearing a mask. Neck: Supple, no elevated JVP or carotid bruits, no thyromegaly. Lungs: Clear to auscultation, nonlabored breathing at rest. Cardiac: Regular rate and rhythm, no S3 or significant systolic murmur. Extremities: No pitting edema.  ECG:  An ECG dated 11/30/2020 was personally reviewed today and demonstrated:  Sinus rhythm with IVCD and repolarization abnormalities.  Recent  Labwork: 04/18/2021: ALT 13; AST 21; Hemoglobin 11.5; Platelets 166 05/05/2021: BUN 17; Creatinine, Ser 1.82; Potassium 3.9; Sodium 135  July 2021: Cholesterol 89, triglycerides 65, HDL 42, LDL 32  Other Studies Reviewed Today:  Echocardiogram 01/07/2020:  1. Left ventricular ejection fraction, by estimation, is 10-15%. The left  ventricle has severely decreased function. The left ventricle demonstrates  global hypokinesis. The left ventricular internal cavity size was mildly  dilated. Left ventricular  diastolic parameters are consistent with Grade I diastolic dysfunction  (impaired relaxation). Elevated left ventricular end-diastolic pressure.   2. Right ventricular systolic function is mildly reduced. The right  ventricular size is normal. There is mildly elevated pulmonary artery  systolic pressure.   3. Left atrial size was severely dilated.   4. The mitral valve is grossly normal. Mild mitral valve regurgitation.   5. The aortic valve is tricuspid. Aortic valve regurgitation is trivial.  No aortic stenosis is present.   6. The inferior  vena cava is dilated in size with <50% respiratory  variability, suggesting right atrial pressure of 15 mmHg.   Assessment and Plan:  1.  Mixed cardiomyopathy with LVEF 10 to 15% with prior history of CAD and inferior infarct as well as alcohol abuse.  He has been remarkably stable from a clinical perspective, was in hospice program initially.  He reports NYHA class II dyspnea, does not appear fluid overloaded.  He remains on Coreg, hydralazine, Isordil, Aldactone, and Lasix with potassium supplement.  We will continue conservative management at this point.  2.  CKD stage IIIb, creatinine 1.82.  3.  CAD with known occlusion of the mid circumflex, BMS to the RCA in 2007, and moderate residual LAD disease.  He does not describe any active angina at this time.  Medication Adjustments/Labs and Tests Ordered: Current medicines are reviewed at length with  the patient today.  Concerns regarding medicines are outlined above.   Tests Ordered: No orders of the defined types were placed in this encounter.   Medication Changes: No orders of the defined types were placed in this encounter.   Disposition:  Follow up 6 months.  Signed, Satira Sark, MD, [facility] 06/05/2021 4:07 PM    [facility], [place], [place] [address] Phone: [phone]; Fax: [phone]

## 2021-08-31 DIAGNOSIS — I10 Essential (primary) hypertension: Secondary | ICD-10-CM | POA: Diagnosis not present

## 2021-09-04 ENCOUNTER — Other Ambulatory Visit (HOSPITAL_COMMUNITY): Payer: Self-pay | Admitting: Family Medicine

## 2021-09-04 ENCOUNTER — Other Ambulatory Visit: Payer: Self-pay | Admitting: Family Medicine

## 2021-09-04 DIAGNOSIS — N184 Chronic kidney disease, stage 4 (severe): Secondary | ICD-10-CM | POA: Diagnosis not present

## 2021-09-04 DIAGNOSIS — R809 Proteinuria, unspecified: Secondary | ICD-10-CM | POA: Diagnosis not present

## 2021-09-04 DIAGNOSIS — R771 Abnormality of globulin: Secondary | ICD-10-CM | POA: Diagnosis not present

## 2021-09-04 DIAGNOSIS — M109 Gout, unspecified: Secondary | ICD-10-CM | POA: Diagnosis not present

## 2021-09-04 DIAGNOSIS — I10 Essential (primary) hypertension: Secondary | ICD-10-CM | POA: Diagnosis not present

## 2021-09-04 DIAGNOSIS — D631 Anemia in chronic kidney disease: Secondary | ICD-10-CM | POA: Diagnosis not present

## 2021-09-04 DIAGNOSIS — I5022 Chronic systolic (congestive) heart failure: Secondary | ICD-10-CM | POA: Diagnosis not present

## 2021-09-04 DIAGNOSIS — Z720 Tobacco use: Secondary | ICD-10-CM | POA: Diagnosis not present

## 2021-09-04 DIAGNOSIS — D696 Thrombocytopenia, unspecified: Secondary | ICD-10-CM | POA: Diagnosis not present

## 2021-09-04 DIAGNOSIS — E782 Mixed hyperlipidemia: Secondary | ICD-10-CM | POA: Diagnosis not present

## 2021-09-04 DIAGNOSIS — Z122 Encounter for screening for malignant neoplasm of respiratory organs: Secondary | ICD-10-CM

## 2021-09-04 DIAGNOSIS — K219 Gastro-esophageal reflux disease without esophagitis: Secondary | ICD-10-CM | POA: Diagnosis not present

## 2021-09-05 ENCOUNTER — Other Ambulatory Visit: Payer: Self-pay | Admitting: Family Medicine

## 2021-09-05 ENCOUNTER — Other Ambulatory Visit (HOSPITAL_COMMUNITY): Payer: Self-pay | Admitting: Family Medicine

## 2021-09-05 DIAGNOSIS — F17200 Nicotine dependence, unspecified, uncomplicated: Secondary | ICD-10-CM

## 2021-09-05 DIAGNOSIS — Z720 Tobacco use: Secondary | ICD-10-CM

## 2021-09-06 ENCOUNTER — Other Ambulatory Visit: Payer: Self-pay | Admitting: Family Medicine

## 2021-09-06 ENCOUNTER — Other Ambulatory Visit (HOSPITAL_COMMUNITY): Payer: Self-pay | Admitting: Family Medicine

## 2021-09-06 DIAGNOSIS — Z87891 Personal history of nicotine dependence: Secondary | ICD-10-CM

## 2021-09-06 DIAGNOSIS — Z122 Encounter for screening for malignant neoplasm of respiratory organs: Secondary | ICD-10-CM

## 2021-10-13 DIAGNOSIS — R771 Abnormality of globulin: Secondary | ICD-10-CM | POA: Diagnosis not present

## 2021-10-19 ENCOUNTER — Ambulatory Visit (HOSPITAL_COMMUNITY): Admission: RE | Admit: 2021-10-19 | Payer: Medicare Other | Source: Ambulatory Visit

## 2021-10-19 ENCOUNTER — Encounter (HOSPITAL_COMMUNITY): Payer: Self-pay

## 2021-10-20 ENCOUNTER — Ambulatory Visit (HOSPITAL_COMMUNITY)
Admission: RE | Admit: 2021-10-20 | Discharge: 2021-10-20 | Disposition: A | Discharge status: Home / Self Care | Payer: Medicare Other | Source: Ambulatory Visit | Attending: Family Medicine | Admitting: Family Medicine

## 2021-10-20 ENCOUNTER — Other Ambulatory Visit: Payer: Self-pay

## 2021-10-20 DIAGNOSIS — Z122 Encounter for screening for malignant neoplasm of respiratory organs: Secondary | ICD-10-CM | POA: Diagnosis not present

## 2021-10-20 DIAGNOSIS — F17210 Nicotine dependence, cigarettes, uncomplicated: Secondary | ICD-10-CM | POA: Diagnosis not present

## 2021-10-20 DIAGNOSIS — Z87891 Personal history of nicotine dependence: Secondary | ICD-10-CM | POA: Diagnosis not present

## 2021-11-01 ENCOUNTER — Ambulatory Visit (INDEPENDENT_AMBULATORY_CARE_PROVIDER_SITE_OTHER): Payer: Medicare Other | Admitting: Internal Medicine

## 2021-11-01 ENCOUNTER — Encounter: Payer: Self-pay | Admitting: Internal Medicine

## 2021-11-01 ENCOUNTER — Other Ambulatory Visit: Payer: Self-pay

## 2021-11-01 DIAGNOSIS — F17210 Nicotine dependence, cigarettes, uncomplicated: Secondary | ICD-10-CM | POA: Diagnosis not present

## 2021-11-01 DIAGNOSIS — R911 Solitary pulmonary nodule: Secondary | ICD-10-CM | POA: Insufficient documentation

## 2021-11-01 DIAGNOSIS — R0609 Other forms of dyspnea: Secondary | ICD-10-CM | POA: Insufficient documentation

## 2021-11-01 NOTE — Patient Instructions (Addendum)
The key is to stop smoking completely before smoking completely stops you!  We will walk you to see how well you do   We will schedule a PET scan and PFT next available and call you with the results  and go from there.

## 2021-11-01 NOTE — Assessment & Plan Note (Addendum)
CT 10/20/21 with emphysema - 11/01/2021   Walked on RA  x  3  lap(s) =  approx 450  ft  @ mod/fast pace, stopped due to end of study with sob on last lap with lowest 02 sats 98%  - PFT's 11/01/2021  Ordered  - Echo  11/01/2021  Ordered   He did surprisingly well on his walk but lesion is in medial LL and suspect he would need L Lobectomy for surgical cure so will complete w/u above and decide then   Each maintenance medication was reviewed in detail including emphasizing most importantly the difference between maintenance and prns and under what circumstances the prns are to be triggered using an action plan format where appropriate.  Total time for H and P, chart review, counseling,  directly observing portions of ambulatory 02 saturation study/ and generating customized AVS unique to this office visit / same day charting = 50  min

## 2021-11-01 NOTE — Assessment & Plan Note (Signed)
Active smoker LDSCT 2/3//23 vs  11/05/2018  New spiculated 22.2 mm medial left lower lobe nodule. 2. Basilar predominant subpleural fibrosis may be due to usual interstitial pneumonitis. Findings have progressed from 11/05/2018. 3. Bilateral renal stones. 4. Aortic atherosclerosis (ICD10-I70.0). Coronary artery calcification. 5.  Emphysema (ICD10-J43.9). - PET 11/01/2021 >>>   High likelihood this is lung ca in inoperable setting copd /chf/dementia but could be considered for SRT if this is a solitary/ primary lung ca so rec  PET Then FOB for super CD if pfts/ repeat echo allow vs empirical RT if PET pos but not a candidate for navigational bx based on lung or heart w/u.  Discussed in detail all the  indications, usual  risks and alternatives  relative to the benefits with patient /sister (CNA) who agree  to proceed with w/u as outlined.     [name]

## 2021-11-01 NOTE — Assessment & Plan Note (Signed)
4-5 min discussion re active cigarette smoking in addition to office E&M  Ask about tobacco use:   ongoing Advise quitting   I took an extended  opportunity with this patient to outline the consequences of continued cigarette use  in airway disorders based on all the data we have from the multiple national lung health studies (perfomed over decades at millions of dollars in cost)  indicating that smoking cessation, not choice of inhalers or physicians, is the most important aspect of his care.   Assess willingness:  Not interested  at this point Assist in quit attempt:  Per PCP when ready Arrange follow up:   Follow up per Primary Care planned

## 2021-11-01 NOTE — Progress Notes (Signed)
[name] [name], male    DOB: Jun 25, 1946,    MRN: [id number]   Brief patient profile:  1  yobm  active smoker with severe systolic chf/mild dementia   referred to pulmonary clinic in [place]  11/01/2021 by Dr [name] [name]  for MPN    History of Present Illness  11/01/2021  Pulmonary/ 1st office eval/ [name] [name] / [name] [name] [facility]  Chief Complaint  Patient presents with   Consult    Ref by Dr. [place] [name] for nodules on lung    Dyspnea:  denies limitation but very sedentary  Cough: variable "spit " not bloody or purulent  Sleep: no noct resp/ does fine flat one pillow  SABA use: none now and not clear what he's tried/failed or why placed on inhalers if not sob  No obvious day to day or daytime variability or assoc excess/ purulent sputum or mucus plugs or hemoptysis or cp or chest tightness, subjective wheeze or overt sinus or hb symptoms.   Sleeping  without nocturnal  or early am exacerbation  of respiratory  c/o's or need for noct saba. Also denies any obvious fluctuation of symptoms with weather or environmental changes or other aggravating or alleviating factors except as outlined above   No unusual exposure hx or h/o childhood pna/ asthma or knowledge of premature birth.  Current Allergies, Complete Past Medical History, Past Surgical History, Family History, and Social History were reviewed in [employer] record.  ROS  The following are not active complaints unless bolded Hoarseness, sore throat, dysphagia, dental problems, itching, sneezing,  nasal congestion or discharge of excess mucus or purulent secretions, ear ache,   fever, chills, sweats, unintended wt loss or wt gain, classically pleuritic or exertional cp,  orthopnea pnd or arm/hand swelling  or leg swelling, presyncope, palpitations, abdominal pain, anorexia, nausea, vomiting, diarrhea  or change in bowel habits or change in bladder habits, change in stools or change in urine, dysuria, hematuria,  rash,  arthralgias, visual complaints, headache, numbness, weakness or ataxia or problems with walking or coordination,  change in mood or  memory.           Past Medical History:  Diagnosis Date   CAD (coronary artery disease)    IMI in 5/07 with CTO mid cx, 60% LAD, 99% RCA-> BMS; mod. impaired LV function; EF:35-40% 2/10   Cardiomyopathy ([place])    Cirrhosis ([place])    History of excessive alcohol use   CKD (chronic kidney disease) stage 4, GFR 15-29 ml/min (HCC)    Dementia (HCC)    Gastroesophageal reflux disease    Hyperlipidemia     Outpatient Medications Prior to Visit-  - NOTE:   Unable to verify as accurately reflecting what pt takes    Medication Sig Dispense Refill   allopurinol (ZYLOPRIM) 100 MG tablet Take 100 mg by mouth daily.     atorvastatin (LIPITOR) 20 MG tablet Take 20 mg by mouth daily.     carvedilol (COREG) 6.25 MG tablet Take 1 tablet (6.25 mg total) by mouth 2 (two) times daily with a meal. 60 tablet 3   folic acid (FOLVITE) 1 MG tablet Take 1 tablet (1 mg total) by mouth daily. 30 tablet 3   isosorbide dinitrate (ISORDIL) 10 MG tablet Take 1 tablet (10 mg total) by mouth 2 (two) times daily. 60 tablet 5   Magnesium Oxide 400 (240 Mg) MG TABS Take 1 tablet (400 mg total) by mouth daily. 30 tablet 3   megestrol (MEGACE)  40 MG tablet megestrol 40 mg tablet     Multiple Vitamin (DAILY-VITE MULTIVITAMIN) TABS Take 1 tablet by mouth daily.     Multiple Vitamins-Minerals (MULTIVITAMIN WITH MINERALS) tablet Take 1 tablet by mouth daily. 30 tablet 3   nitroGLYCERIN (NITROSTAT) 0.4 MG SL tablet Place 0.4 mg under the tongue every 5 (five) minutes as needed for chest pain.       pantoprazole (PROTONIX) 40 MG tablet Take 1 tablet (40 mg total) by mouth daily before breakfast. 90 tablet 1   potassium chloride (KLOR-CON) 10 MEQ tablet Take 1 tablet (10 mEq total) by mouth daily. 30 tablet 3   potassium chloride (KLOR-CON) 10 MEQ tablet Take 10 mEq by mouth daily.     senna-docusate  (SENOKOT-S) 8.6-50 MG tablet Take 1 tablet by mouth 2 (two) times daily.     vitamin B-12 (CYANOCOBALAMIN) 100 MCG tablet Take by mouth.     furosemide (LASIX) 40 MG tablet Take 1 tablet (40 mg total) by mouth daily. 30 tablet 11   hydrALAZINE (APRESOLINE) 10 MG tablet Take 1 tablet (10 mg total) by mouth 2 (two) times daily. 60 tablet 5   spironolactone (ALDACTONE) 25 MG tablet Take 0.5 tablets (12.5 mg total) by mouth daily. 15 tablet 5       5   atorvastatin (LIPITOR) 40 MG tablet TAKE 1 TABLET BY MOUTH ONCE DAILY WITH SUPPER. 30 tablet 3       Objective:     BP 134/82 (BP Location: Left Arm, Patient Position: Sitting)    Pulse 62    Temp 98.4 F (36.9 C) (Temporal)    Ht 5\' 6"  (1.676 m)    Wt 120 lb 1.9 oz (54.5 kg)    SpO2 100% Comment: ra   BMI 19.39 kg/m    SpO2: 100 % (ra)  Elderly thin black male prefers to let his sister answer the questions   HEENT : pt wearing mask not removed for exam due to covid - 19 concerns.   NECK :  without JVD/Nodes/TM/ nl carotid upstrokes bilaterally   LUNGS: no acc muscle use,  Min barrel  contour chest wall with bilateral  slightly decreased bs s audible wheeze and  without cough on insp or exp maneuvers and min  Hyperresonant  to  percussion bilaterally     CV:  RRR  no s3 or murmur or increase in P2, and no edema   ABD:  soft and nontender with pos end  insp Hoover's  in the supine position. No bruits or organomegaly appreciated, bowel sounds nl  MS:   Nl gait/  ext warm without deformities, calf tenderness, cyanosis or clubbing No obvious joint restrictions   SKIN: warm and dry without lesions    NEURO:  alert,  nl sensorium with  no motor or cerebellar deficits apparent.  Oriented to person, present address, does not know yesterday was valentine's day or remember Superbowl 4 days prior to OV  but "big football fan of [employer] and [place]"       LDSCT 2/3//23 vs  11/05/2018  New spiculated 22.2 mm medial left lower lobe  nodule. 2. Basilar predominant subpleural fibrosis may be due to usual interstitial pneumonitis. Findings have progressed from 11/05/2018. 3. Bilateral renal stones. 4. Aortic atherosclerosis (ICD10-I70.0). Coronary artery calcification. 5.  Emphysema (ICD10-J43.9).    Assessment   Solitary pulmonary nodule on lung CT Active smoker LDSCT 2/3//23 vs  11/05/2018  New spiculated 22.2 mm medial left lower lobe nodule.  2. Basilar predominant subpleural fibrosis may be due to usual interstitial pneumonitis. Findings have progressed from 11/05/2018. 3. Bilateral renal stones. 4. Aortic atherosclerosis (ICD10-I70.0). Coronary artery calcification. 5.  Emphysema (ICD10-J43.9). - PET 11/01/2021 >>>   High likelihood this is lung ca in inoperable setting copd /chf/dementia but could be considered for SRT if this is a solitary/ primary lung ca so rec  PET Then FOB for super CD if pfts/ repeat echo allow vs empirical RT if PET pos but not a candidate for navigational bx based on lung or heart w/u.  Discussed in detail all the  indications, usual  risks and alternatives  relative to the benefits with patient /sister (CNA) who agree  to proceed with w/u as outlined.     .    DOE (dyspnea on exertion) CT 10/20/21 with emphysema - 11/01/2021   Walked on RA  x  3  lap(s) =  approx 450  ft  @ mod/fast pace, stopped due to end of study with sob on last lap with lowest 02 sats 98%  - PFT's 11/01/2021  Ordered  - Echo  11/01/2021  Ordered   He did surprisingly well on his walk but lesion is in medial LL and suspect he would need L Lobectomy for surgical cure so will complete w/u above and decide then   Each maintenance medication was reviewed in detail including emphasizing most importantly the difference between maintenance and prns and under what circumstances the prns are to be triggered using an action plan format where appropriate.  Total time for H and P, chart review, counseling,  directly  observing portions of ambulatory 02 saturation study/ and generating customized AVS unique to this office visit / same day charting = 50  min                Cigarette smoker 4-5 min discussion re active cigarette smoking in addition to office E&M  Ask about tobacco use:   ongoing Advise quitting   I took an extended  opportunity with this patient to outline the consequences of continued cigarette use  in airway disorders based on all the data we have from the multiple national lung health studies (perfomed over decades at millions of dollars in cost)  indicating that smoking cessation, not choice of inhalers or physicians, is the most important aspect of his care.   Assess willingness:  Not interested  at this point Assist in quit attempt:  Per PCP when ready Arrange follow up:   Follow up per Primary Care planned            Christinia Gully, MD 11/01/2021

## 2021-11-07 DIAGNOSIS — M79675 Pain in left toe(s): Secondary | ICD-10-CM | POA: Diagnosis not present

## 2021-11-07 DIAGNOSIS — M79674 Pain in right toe(s): Secondary | ICD-10-CM | POA: Diagnosis not present

## 2021-11-07 DIAGNOSIS — M79672 Pain in left foot: Secondary | ICD-10-CM | POA: Diagnosis not present

## 2021-11-07 DIAGNOSIS — M79671 Pain in right foot: Secondary | ICD-10-CM | POA: Diagnosis not present

## 2021-11-07 DIAGNOSIS — L110 Acquired keratosis follicularis: Secondary | ICD-10-CM | POA: Diagnosis not present

## 2021-11-07 DIAGNOSIS — I739 Peripheral vascular disease, unspecified: Secondary | ICD-10-CM | POA: Diagnosis not present

## 2021-11-09 ENCOUNTER — Encounter (HOSPITAL_COMMUNITY)
Admission: RE | Admit: 2021-11-09 | Discharge: 2021-11-09 | Disposition: A | Discharge status: Home / Self Care | Payer: Medicare Other | Source: Ambulatory Visit | Attending: Internal Medicine | Admitting: Internal Medicine

## 2021-11-09 ENCOUNTER — Other Ambulatory Visit (INDEPENDENT_AMBULATORY_CARE_PROVIDER_SITE_OTHER): Payer: Self-pay | Admitting: Gastroenterology

## 2021-11-09 ENCOUNTER — Other Ambulatory Visit: Payer: Self-pay

## 2021-11-09 DIAGNOSIS — R911 Solitary pulmonary nodule: Secondary | ICD-10-CM | POA: Insufficient documentation

## 2021-11-09 DIAGNOSIS — I6523 Occlusion and stenosis of bilateral carotid arteries: Secondary | ICD-10-CM | POA: Diagnosis not present

## 2021-11-09 DIAGNOSIS — J439 Emphysema, unspecified: Secondary | ICD-10-CM | POA: Diagnosis not present

## 2021-11-09 DIAGNOSIS — K219 Gastro-esophageal reflux disease without esophagitis: Secondary | ICD-10-CM

## 2021-11-09 DIAGNOSIS — I2510 Atherosclerotic heart disease of native coronary artery without angina pectoris: Secondary | ICD-10-CM | POA: Diagnosis not present

## 2021-11-09 DIAGNOSIS — I739 Peripheral vascular disease, unspecified: Secondary | ICD-10-CM | POA: Diagnosis not present

## 2021-11-09 MED ORDER — FLUDEOXYGLUCOSE F - 18 (FDG) INJECTION
6.0300 | Freq: Once | INTRAVENOUS | Status: AC | PRN
  Administered 2021-11-09: 6.03 via INTRAVENOUS

## 2021-11-09 NOTE — Telephone Encounter (Signed)
Will need office visit for further refills.

## 2021-11-23 ENCOUNTER — Ambulatory Visit (HOSPITAL_COMMUNITY)
Admission: RE | Admit: 2021-11-23 | Discharge: 2021-11-23 | Disposition: A | Discharge status: Home / Self Care | Payer: Medicare Other | Source: Ambulatory Visit | Attending: Internal Medicine | Admitting: Internal Medicine

## 2021-11-23 DIAGNOSIS — R0609 Other forms of dyspnea: Secondary | ICD-10-CM | POA: Insufficient documentation

## 2021-11-23 LAB — ECHOCARDIOGRAM COMPLETE
Area-P 1/2: 1.77 cm2
Calc EF: 22.8 %
MV M vel: 3.63 m/s
MV Peak grad: 52.7 mmHg
S' Lateral: 5.5 cm
Single Plane A2C EF: 29.2 %
Single Plane A4C EF: 17.4 %

## 2021-11-23 NOTE — Progress Notes (Signed)
*  PRELIMINARY RESULTS* ?Echocardiogram ?2D Echocardiogram has been performed. ? ?[name] ?11/23/2021, 2:33 PM ?

## 2021-11-27 NOTE — Progress Notes (Signed)
Called pt and there was no answer-LMTCB °

## 2021-12-01 ENCOUNTER — Other Ambulatory Visit: Payer: Self-pay

## 2021-12-01 ENCOUNTER — Encounter: Payer: Self-pay | Admitting: Pulmonary Disease

## 2021-12-01 ENCOUNTER — Ambulatory Visit (INDEPENDENT_AMBULATORY_CARE_PROVIDER_SITE_OTHER): Payer: Medicare Other | Admitting: Pulmonary Disease

## 2021-12-01 ENCOUNTER — Institutional Professional Consult (permissible substitution): Payer: Medicare Other | Admitting: Internal Medicine

## 2021-12-01 VITALS — BP 106/60 | HR 85 | Temp 97.5°F | Ht 62.0 in | Wt 122.0 lb

## 2021-12-01 DIAGNOSIS — Z720 Tobacco use: Secondary | ICD-10-CM | POA: Diagnosis not present

## 2021-12-01 DIAGNOSIS — I5043 Acute on chronic combined systolic (congestive) and diastolic (congestive) heart failure: Secondary | ICD-10-CM

## 2021-12-01 DIAGNOSIS — R911 Solitary pulmonary nodule: Secondary | ICD-10-CM

## 2021-12-01 DIAGNOSIS — I255 Ischemic cardiomyopathy: Secondary | ICD-10-CM | POA: Diagnosis not present

## 2021-12-01 NOTE — H&P (View-Only) (Signed)
? ?Synopsis: Referred in March 2023 for left lower lobe pulmonary nodule by Celene Squibb, MD ? ?Subjective:  ? ?PATIENT ID: [name] GENDER: male DOB: Dec 07, 1945, MRN: [id number] ? ?Chief Complaint  ?Patient presents with  ? Other  ?  Lung nodule, discuss bronchoscopy   ? ? ?This is a 76 year old gentleman, past medical history of coronary disease, ischemic cardiomyopathy, longstanding history of alcohol use, tobacco use, cirrhosis, hyperlipidemia.  Patient had a lung cancer screening CT that showed a new left lower lobe pulmonary nodule followed by nuclear medicine PET scan.Patient's nuclear medicine PET scan was on 11/09/2021 which revealed a 2 cm left lower lobe hypermetabolic lung nodule concerning for a primary lung lesion/primary bronchogenic carcinoma.  Patient unfortunately is still smoking approximately 9 to 10 cigarettes/day.  He was referred by Dr. Melvyn Novas for evaluation and consideration for navigational bronchoscopy tissue sampling, fiducial placement and then subsequent referral to radiation oncology for SBRT treatments.  Felt not to be a surgical candidate due to his ischemic cardiomyopathy and other multiple medical comorbidities. ? ? ? ?Past Medical History:  ?Diagnosis Date  ? CAD (coronary artery disease)   ? IMI in 5/07 with CTO mid cx, 60% LAD, 99% RCA-> BMS; mod. impaired LV function; EF:35-40% 2/10  ? Cardiomyopathy ([place])   ? Cirrhosis ([place])   ? History of excessive alcohol use  ? CKD (chronic kidney disease) stage 4, GFR 15-29 ml/min (HCC)   ? Dementia ([place])   ? Gastroesophageal reflux disease   ? Hyperlipidemia   ?  ? ?Family History  ?Problem Relation Age of Onset  ? Other Brother   ?  ? ?Past Surgical History:  ?Procedure Laterality Date  ? BIOPSY  12/05/2018  ? Procedure: BIOPSY;  Surgeon: Rogene Houston, MD;  [facility];  Service: Endoscopy;;  gastric  ? BIOPSY  05/09/2021  ? Procedure: BIOPSY;  Surgeon: Montez Morita, Quillian Quince, MD;  [facility];  Service:  Gastroenterology;;  ? COLONOSCOPY WITH PROPOFOL N/A 05/09/2021  ? Procedure: COLONOSCOPY WITH PROPOFOL;  Surgeon: Harvel Quale, MD;  [facility];  Service: Gastroenterology;  Laterality: N/A;  12:30  ? ESOPHAGOGASTRODUODENOSCOPY N/A 12/19/2016  ? Procedure: ESOPHAGOGASTRODUODENOSCOPY (EGD);  Surgeon: Rogene Houston, MD;  [facility];  Service: Endoscopy;  Laterality: N/A;  ? ESOPHAGOGASTRODUODENOSCOPY N/A 12/21/2016  ? Procedure: ESOPHAGOGASTRODUODENOSCOPY (EGD);  Surgeon: Danie Binder, MD;  [facility];  Service: Endoscopy;  Laterality: N/A;  ? ESOPHAGOGASTRODUODENOSCOPY (EGD) WITH PROPOFOL N/A 12/22/2016  ? Procedure: ESOPHAGOGASTRODUODENOSCOPY (EGD) WITH PROPOFOL;  Surgeon: Mauri Pole, MD;  Location: [facility];  Service: Endoscopy;  Laterality: N/A;  ? ESOPHAGOGASTRODUODENOSCOPY (EGD) WITH PROPOFOL N/A 12/05/2018  ? Procedure: ESOPHAGOGASTRODUODENOSCOPY (EGD) WITH PROPOFOL;  Surgeon: Rogene Houston, MD;  [facility];  Service: Endoscopy;  Laterality: N/A;  1:35  ? ESOPHAGOGASTRODUODENOSCOPY (EGD) WITH PROPOFOL N/A 05/09/2021  ? Procedure: ESOPHAGOGASTRODUODENOSCOPY (EGD) WITH PROPOFOL;  Surgeon: Harvel Quale, MD;  [facility];  Service: Gastroenterology;  Laterality: N/A;  ? POLYPECTOMY  05/09/2021  ? Procedure: POLYPECTOMY;  Surgeon: Harvel Quale, MD;  [facility];  Service: Gastroenterology;;  ? ? ?Social History  ? ?Socioeconomic History  ? Marital status: Single  ?  Spouse name: Not on file  ? Number of children: Not on file  ? Years of education: Not on file  ? Highest education level: Not on file  ?Occupational History  ? Occupation: Retired  ?Tobacco  Use  ? Smoking status: Every Day  ?  Packs/day: 0.50  ?  Years: 60.00  ?  Pack years: 30.00  ?  Types: Cigarettes  ? Smokeless tobacco: Never  ?Vaping Use  ? Vaping Use: Never used  ?Substance and Sexual Activity  ? Alcohol use: Not Currently  ?   Comment: per sister "he had one beer last week"  ? Drug use: No  ? Sexual activity: Not on file  ?Other Topics Concern  ? Not on file  ?Social History Narrative  ? Resides in supervised living situation  ? No regular exercise  ? ?Social Determinants of Health  ? ?Financial Resource Strain: Not on file  ?Food Insecurity: Not on file  ?Transportation Needs: Not on file  ?Physical Activity: Not on file  ?Stress: Not on file  ?Social Connections: Not on file  ?Intimate Partner Violence: Not on file  ?  ? ?No Known Allergies  ? ?Outpatient Medications Prior to Visit  ?Medication Sig Dispense Refill  ? atorvastatin (LIPITOR) 20 MG tablet Take 20 mg by mouth daily.    ? carvedilol (COREG) 6.25 MG tablet Take 1 tablet (6.25 mg total) by mouth 2 (two) times daily with a meal. 60 tablet 3  ? isosorbide dinitrate (ISORDIL) 10 MG tablet Take 1 tablet (10 mg total) by mouth 2 (two) times daily. 60 tablet 5  ? nitroGLYCERIN (NITROSTAT) 0.4 MG SL tablet Place 0.4 mg under the tongue every 5 (five) minutes as needed for chest pain.      ? allopurinol (ZYLOPRIM) 100 MG tablet Take 100 mg by mouth daily. (Patient not taking: Reported on 12/01/2021)    ? folic acid (FOLVITE) 1 MG tablet Take 1 tablet (1 mg total) by mouth daily. (Patient not taking: Reported on 12/01/2021) 30 tablet 3  ? furosemide (LASIX) 40 MG tablet Take 1 tablet (40 mg total) by mouth daily. 30 tablet 11  ? hydrALAZINE (APRESOLINE) 10 MG tablet Take 1 tablet (10 mg total) by mouth 2 (two) times daily. 60 tablet 5  ? Magnesium Oxide 400 (240 Mg) MG TABS Take 1 tablet (400 mg total) by mouth daily. (Patient not taking: Reported on 12/01/2021) 30 tablet 3  ? megestrol (MEGACE) 40 MG tablet megestrol 40 mg tablet (Patient not taking: Reported on 12/01/2021)    ? Multiple Vitamin (DAILY-VITE MULTIVITAMIN) TABS Take 1 tablet by mouth daily. (Patient not taking: Reported on 12/01/2021)    ? Multiple Vitamins-Minerals (MULTIVITAMIN WITH MINERALS) tablet Take 1 tablet by mouth  daily. (Patient not taking: Reported on 12/01/2021) 30 tablet 3  ? pantoprazole (PROTONIX) 40 MG tablet TAKE 1 TABLET BY MOUTH ONCE DAILY BEFORE BREAKFAST. (Patient not taking: Reported on 12/01/2021) 90 tablet 0  ? potassium chloride (KLOR-CON) 10 MEQ tablet Take 1 tablet (10 mEq total) by mouth daily. (Patient not taking: Reported on 12/01/2021) 30 tablet 3  ? potassium chloride (KLOR-CON) 10 MEQ tablet Take 10 mEq by mouth daily. (Patient not taking: Reported on 12/01/2021)    ? senna-docusate (SENOKOT-S) 8.6-50 MG tablet Take 1 tablet by mouth 2 (two) times daily. (Patient not taking: Reported on 12/01/2021)    ? spironolactone (ALDACTONE) 25 MG tablet Take 0.5 tablets (12.5 mg total) by mouth daily. 15 tablet 5  ? tiotropium (SPIRIVA HANDIHALER) 18 MCG inhalation capsule Place 1 capsule (18 mcg total) into inhaler and inhale daily. 30 capsule 5  ? vitamin B-12 (CYANOCOBALAMIN) 100 MCG tablet Take by mouth. (Patient not taking: Reported on 12/01/2021)    ? ?  No facility-administered medications prior to visit.  ? ? ?Review of Systems  ?Constitutional:  Negative for chills, fever, malaise/fatigue and weight loss.  ?HENT:  Negative for hearing loss, sore throat and tinnitus.   ?Eyes:  Negative for blurred vision and double vision.  ?Respiratory:  Negative for cough, hemoptysis, sputum production, shortness of breath, wheezing and stridor.   ?Cardiovascular:  Negative for chest pain, palpitations, orthopnea, leg swelling and PND.  ?Gastrointestinal:  Negative for abdominal pain, constipation, diarrhea, heartburn, nausea and vomiting.  ?Genitourinary:  Negative for dysuria, hematuria and urgency.  ?Musculoskeletal:  Negative for joint pain and myalgias.  ?Skin:  Negative for itching and rash.  ?Neurological:  Negative for dizziness, tingling, weakness and headaches.  ?Endo/Heme/Allergies:  Negative for environmental allergies. Does not bruise/bleed easily.  ?Psychiatric/Behavioral:  Negative for depression. The patient is  not nervous/anxious and does not have insomnia.   ?All other systems reviewed and are negative. ? ? ?Objective:  ?Physical Exam ?Vitals reviewed.  ?Constitutional:   ?   General: He is not in acute

## 2021-12-01 NOTE — Patient Instructions (Signed)
Thank you for visiting Dr. Valeta Harms at [facility]. ?Today we recommend the following: ? ?Orders Placed This Encounter  ?Procedures  ? Procedural/ Surgical Case Request: ROBOTIC ASSISTED NAVIGATIONAL BRONCHOSCOPY  ? Ambulatory referral to Pulmonology  ? ?Bronchoscopy 12/19/2021 ? ?Return in about 25 days (around 12/26/2021) for with Eric Form, NP. ? ? ? ?Please do your part to reduce the spread of COVID-19.  ? ?

## 2021-12-01 NOTE — Progress Notes (Signed)
? ?Synopsis: Referred in March 2023 for left lower lobe pulmonary nodule by Celene Squibb, MD ? ?Subjective:  ? ?PATIENT ID: [name] GENDER: male DOB: 07/07/1946, MRN: [id number] ? ?Chief Complaint  ?Patient presents with  ? Other  ?  Lung nodule, discuss bronchoscopy   ? ? ?This is a 76 year old gentleman, past medical history of coronary disease, ischemic cardiomyopathy, longstanding history of alcohol use, tobacco use, cirrhosis, hyperlipidemia.  Patient had a lung cancer screening CT that showed a new left lower lobe pulmonary nodule followed by nuclear medicine PET scan.Patient's nuclear medicine PET scan was on 11/09/2021 which revealed a 2 cm left lower lobe hypermetabolic lung nodule concerning for a primary lung lesion/primary bronchogenic carcinoma.  Patient unfortunately is still smoking approximately 9 to 10 cigarettes/day.  He was referred by Dr. Melvyn Novas for evaluation and consideration for navigational bronchoscopy tissue sampling, fiducial placement and then subsequent referral to radiation oncology for SBRT treatments.  Felt not to be a surgical candidate due to his ischemic cardiomyopathy and other multiple medical comorbidities. ? ? ? ?Past Medical History:  ?Diagnosis Date  ? CAD (coronary artery disease)   ? IMI in 5/07 with CTO mid cx, 60% LAD, 99% RCA-> BMS; mod. impaired LV function; EF:35-40% 2/10  ? Cardiomyopathy ([place])   ? Cirrhosis ([place])   ? History of excessive alcohol use  ? CKD (chronic kidney disease) stage 4, GFR 15-29 ml/min (HCC)   ? Dementia ([place])   ? Gastroesophageal reflux disease   ? Hyperlipidemia   ?  ? ?Family History  ?Problem Relation Age of Onset  ? Other Brother   ?  ? ?Past Surgical History:  ?Procedure Laterality Date  ? BIOPSY  12/05/2018  ? Procedure: BIOPSY;  Surgeon: Rogene Houston, MD;  [facility];  Service: Endoscopy;;  gastric  ? BIOPSY  05/09/2021  ? Procedure: BIOPSY;  Surgeon: Montez Morita, Quillian Quince, MD;  [facility];  Service:  Gastroenterology;;  ? COLONOSCOPY WITH PROPOFOL N/A 05/09/2021  ? Procedure: COLONOSCOPY WITH PROPOFOL;  Surgeon: Harvel Quale, MD;  [facility];  Service: Gastroenterology;  Laterality: N/A;  12:30  ? ESOPHAGOGASTRODUODENOSCOPY N/A 12/19/2016  ? Procedure: ESOPHAGOGASTRODUODENOSCOPY (EGD);  Surgeon: Rogene Houston, MD;  [facility];  Service: Endoscopy;  Laterality: N/A;  ? ESOPHAGOGASTRODUODENOSCOPY N/A 12/21/2016  ? Procedure: ESOPHAGOGASTRODUODENOSCOPY (EGD);  Surgeon: Danie Binder, MD;  [facility];  Service: Endoscopy;  Laterality: N/A;  ? ESOPHAGOGASTRODUODENOSCOPY (EGD) WITH PROPOFOL N/A 12/22/2016  ? Procedure: ESOPHAGOGASTRODUODENOSCOPY (EGD) WITH PROPOFOL;  Surgeon: Mauri Pole, MD;  Location: [facility];  Service: Endoscopy;  Laterality: N/A;  ? ESOPHAGOGASTRODUODENOSCOPY (EGD) WITH PROPOFOL N/A 12/05/2018  ? Procedure: ESOPHAGOGASTRODUODENOSCOPY (EGD) WITH PROPOFOL;  Surgeon: Rogene Houston, MD;  [facility];  Service: Endoscopy;  Laterality: N/A;  1:35  ? ESOPHAGOGASTRODUODENOSCOPY (EGD) WITH PROPOFOL N/A 05/09/2021  ? Procedure: ESOPHAGOGASTRODUODENOSCOPY (EGD) WITH PROPOFOL;  Surgeon: Harvel Quale, MD;  [facility];  Service: Gastroenterology;  Laterality: N/A;  ? POLYPECTOMY  05/09/2021  ? Procedure: POLYPECTOMY;  Surgeon: Harvel Quale, MD;  [facility];  Service: Gastroenterology;;  ? ? ?Social History  ? ?Socioeconomic History  ? Marital status: Single  ?  Spouse name: Not on file  ? Number of children: Not on file  ? Years of education: Not on file  ? Highest education level: Not on file  ?Occupational History  ? Occupation: Retired  ?Tobacco  Use  ? Smoking status: Every Day  ?  Packs/day: 0.50  ?  Years: 60.00  ?  Pack years: 30.00  ?  Types: Cigarettes  ? Smokeless tobacco: Never  ?Vaping Use  ? Vaping Use: Never used  ?Substance and Sexual Activity  ? Alcohol use: Not Currently  ?   Comment: per sister "he had one beer last week"  ? Drug use: No  ? Sexual activity: Not on file  ?Other Topics Concern  ? Not on file  ?Social History Narrative  ? Resides in supervised living situation  ? No regular exercise  ? ?Social Determinants of Health  ? ?Financial Resource Strain: Not on file  ?Food Insecurity: Not on file  ?Transportation Needs: Not on file  ?Physical Activity: Not on file  ?Stress: Not on file  ?Social Connections: Not on file  ?Intimate Partner Violence: Not on file  ?  ? ?No Known Allergies  ? ?Outpatient Medications Prior to Visit  ?Medication Sig Dispense Refill  ? atorvastatin (LIPITOR) 20 MG tablet Take 20 mg by mouth daily.    ? carvedilol (COREG) 6.25 MG tablet Take 1 tablet (6.25 mg total) by mouth 2 (two) times daily with a meal. 60 tablet 3  ? isosorbide dinitrate (ISORDIL) 10 MG tablet Take 1 tablet (10 mg total) by mouth 2 (two) times daily. 60 tablet 5  ? nitroGLYCERIN (NITROSTAT) 0.4 MG SL tablet Place 0.4 mg under the tongue every 5 (five) minutes as needed for chest pain.      ? allopurinol (ZYLOPRIM) 100 MG tablet Take 100 mg by mouth daily. (Patient not taking: Reported on 12/01/2021)    ? folic acid (FOLVITE) 1 MG tablet Take 1 tablet (1 mg total) by mouth daily. (Patient not taking: Reported on 12/01/2021) 30 tablet 3  ? furosemide (LASIX) 40 MG tablet Take 1 tablet (40 mg total) by mouth daily. 30 tablet 11  ? hydrALAZINE (APRESOLINE) 10 MG tablet Take 1 tablet (10 mg total) by mouth 2 (two) times daily. 60 tablet 5  ? Magnesium Oxide 400 (240 Mg) MG TABS Take 1 tablet (400 mg total) by mouth daily. (Patient not taking: Reported on 12/01/2021) 30 tablet 3  ? megestrol (MEGACE) 40 MG tablet megestrol 40 mg tablet (Patient not taking: Reported on 12/01/2021)    ? Multiple Vitamin (DAILY-VITE MULTIVITAMIN) TABS Take 1 tablet by mouth daily. (Patient not taking: Reported on 12/01/2021)    ? Multiple Vitamins-Minerals (MULTIVITAMIN WITH MINERALS) tablet Take 1 tablet by mouth  daily. (Patient not taking: Reported on 12/01/2021) 30 tablet 3  ? pantoprazole (PROTONIX) 40 MG tablet TAKE 1 TABLET BY MOUTH ONCE DAILY BEFORE BREAKFAST. (Patient not taking: Reported on 12/01/2021) 90 tablet 0  ? potassium chloride (KLOR-CON) 10 MEQ tablet Take 1 tablet (10 mEq total) by mouth daily. (Patient not taking: Reported on 12/01/2021) 30 tablet 3  ? potassium chloride (KLOR-CON) 10 MEQ tablet Take 10 mEq by mouth daily. (Patient not taking: Reported on 12/01/2021)    ? senna-docusate (SENOKOT-S) 8.6-50 MG tablet Take 1 tablet by mouth 2 (two) times daily. (Patient not taking: Reported on 12/01/2021)    ? spironolactone (ALDACTONE) 25 MG tablet Take 0.5 tablets (12.5 mg total) by mouth daily. 15 tablet 5  ? tiotropium (SPIRIVA HANDIHALER) 18 MCG inhalation capsule Place 1 capsule (18 mcg total) into inhaler and inhale daily. 30 capsule 5  ? vitamin B-12 (CYANOCOBALAMIN) 100 MCG tablet Take by mouth. (Patient not taking: Reported on 12/01/2021)    ? ?  No facility-administered medications prior to visit.  ? ? ?Review of Systems  ?Constitutional:  Negative for chills, fever, malaise/fatigue and weight loss.  ?HENT:  Negative for hearing loss, sore throat and tinnitus.   ?Eyes:  Negative for blurred vision and double vision.  ?Respiratory:  Negative for cough, hemoptysis, sputum production, shortness of breath, wheezing and stridor.   ?Cardiovascular:  Negative for chest pain, palpitations, orthopnea, leg swelling and PND.  ?Gastrointestinal:  Negative for abdominal pain, constipation, diarrhea, heartburn, nausea and vomiting.  ?Genitourinary:  Negative for dysuria, hematuria and urgency.  ?Musculoskeletal:  Negative for joint pain and myalgias.  ?Skin:  Negative for itching and rash.  ?Neurological:  Negative for dizziness, tingling, weakness and headaches.  ?Endo/Heme/Allergies:  Negative for environmental allergies. Does not bruise/bleed easily.  ?Psychiatric/Behavioral:  Negative for depression. The patient is  not nervous/anxious and does not have insomnia.   ?All other systems reviewed and are negative. ? ? ?Objective:  ?Physical Exam ?Vitals reviewed.  ?Constitutional:   ?   General: He is not in acute

## 2021-12-06 ENCOUNTER — Telehealth: Payer: Self-pay | Admitting: Pulmonary Disease

## 2021-12-06 DIAGNOSIS — R911 Solitary pulmonary nodule: Secondary | ICD-10-CM

## 2021-12-06 NOTE — Telephone Encounter (Signed)
-----   Message from Darral Dash, RN sent at 12/06/2021  1:02 PM EDT ----- ?Regarding: pt needs new CT ?Hey, [name] needs a superD ordered.  His previous CT was only a low dose.  Thanks! ? ?Larene Beach  ? ?

## 2021-12-06 NOTE — Telephone Encounter (Signed)
Orders for super D CT placed. ? ?Garner Nash, DO ?[facility] ?12/06/2021 1:13 PM   ? ?

## 2021-12-13 NOTE — Progress Notes (Signed)
Tried calling the pt and there was no answer and no option to leave msg.

## 2021-12-15 ENCOUNTER — Encounter (HOSPITAL_COMMUNITY): Payer: Self-pay

## 2021-12-15 ENCOUNTER — Ambulatory Visit (HOSPITAL_COMMUNITY)
Admission: RE | Admit: 2021-12-15 | Discharge: 2021-12-15 | Disposition: A | Discharge status: Home / Self Care | Payer: Medicare Other | Source: Ambulatory Visit | Attending: Pulmonary Disease | Admitting: Pulmonary Disease

## 2021-12-15 ENCOUNTER — Other Ambulatory Visit: Payer: Self-pay | Admitting: Pulmonary Disease

## 2021-12-15 ENCOUNTER — Ambulatory Visit: Payer: Medicare Other | Admitting: Cardiology

## 2021-12-15 DIAGNOSIS — R911 Solitary pulmonary nodule: Secondary | ICD-10-CM | POA: Diagnosis not present

## 2021-12-15 DIAGNOSIS — J90 Pleural effusion, not elsewhere classified: Secondary | ICD-10-CM | POA: Diagnosis not present

## 2021-12-15 DIAGNOSIS — J439 Emphysema, unspecified: Secondary | ICD-10-CM | POA: Diagnosis not present

## 2021-12-15 DIAGNOSIS — J479 Bronchiectasis, uncomplicated: Secondary | ICD-10-CM | POA: Diagnosis not present

## 2021-12-15 LAB — SARS CORONAVIRUS 2 (TAT 6-24 HRS): SARS Coronavirus 2: NEGATIVE

## 2021-12-18 ENCOUNTER — Other Ambulatory Visit: Payer: Self-pay

## 2021-12-18 ENCOUNTER — Encounter (HOSPITAL_COMMUNITY): Payer: Self-pay | Admitting: Pulmonary Disease

## 2021-12-18 NOTE — Pre-Procedure Instructions (Signed)
?[facility] [place], [place] ?[address] ?[place] [address] ?Phone: [phone] Fax: [phone] ? ? ?PCP - Celene Squibb, MD ?Cardiologist - Satira Sark, MD ? ?[facility] - NPO ? ?COVID TEST- Y Neg 12/15/21 ? ?Anesthesia review: Y ? ?Patient verbally denies any shortness of breath, fever, cough and chest pain during phone call ? ? ?-------------  SDW INSTRUCTIONS given: ? ?Your procedure is scheduled on 12/19/21. ? Report to [facility] Main Entrance "A" at 1030 A.M., and check in at the Admitting office. ? Call this number if you have problems the morning of surgery: ? [phone] ? ? Remember: ? Do not eat or drink after midnight the night before your surgery ? ?  ? Take these medicines the morning of surgery with A SIP OF WATER  ?allopurinol (ZYLOPRIM)  ?carvedilol (COREG)  ?hydrALAZINE (APRESOLINE) ? ?As of today, STOP taking any Aspirin (unless otherwise instructed by your surgeon) Aleve, Naproxen, Ibuprofen, Motrin, Advil, Goody's, BC's, all herbal medications, fish oil, and all vitamins. ? ?         ?           Do not wear jewelry, make up, or nail polish ?           Do not wear lotions, powders, perfumes/colognes, or deodorant. ?           Do not shave 48 hours prior to surgery.  Men may shave face and neck. ?           Do not bring valuables to the hospital. ?           [facility] is not responsible for any belongings or valuables. ? ?Do NOT Smoke (Tobacco/Vaping) 24 hours prior to your procedure ?If you use a CPAP at night, you may bring all equipment for your overnight stay. ?  ?Contacts, glasses, dentures or bridgework may not be worn into surgery.    ?  ?For patients admitted to the hospital, discharge time will be determined by your treatment team. ?  ?Patients discharged the day of surgery will not be allowed to drive home, and someone needs to stay with them for 24 hours. ? ? ? ?Special instructions:   ?[facility] [place]- Preparing For Surgery ? ?Before surgery, you can  play an important role. Because skin is not sterile, your skin needs to be as free of germs as possible. You can reduce the number of germs on your skin by washing with CHG (chlorahexidine gluconate) Soap before surgery.  CHG is an antiseptic cleaner which kills germs and bonds with the skin to continue killing germs even after washing.   ? ?Oral Hygiene is also important to reduce your risk of infection.  Remember - BRUSH YOUR TEETH THE MORNING OF SURGERY WITH YOUR REGULAR TOOTHPASTE ? ?Please do not use if you have an allergy to CHG or antibacterial soaps. If your skin becomes reddened/irritated stop using the CHG.  ?Do not shave (including legs and underarms) for at least 48 hours prior to first CHG shower. It is OK to shave your face. ? ?Please follow these instructions carefully. ?  ?Shower the NIGHT BEFORE SURGERY and the MORNING OF SURGERY with DIAL Soap.  ? ?Pat yourself dry with a CLEAN TOWEL. ? ?Wear CLEAN PAJAMAS to bed the night before surgery ? ?Place CLEAN SHEETS on your bed the night of your first shower and DO NOT SLEEP WITH PETS. ? ? ?Day of Surgery: ?Please shower morning  of surgery  ?Wear Clean/Comfortable clothing the morning of surgery ?Do not apply any deodorants/lotions.   ?Remember to brush your teeth WITH YOUR REGULAR TOOTHPASTE. ?  ?Questions were answered. Patient verbalized understanding of instructions.  ? ? ?   ? ?

## 2021-12-18 NOTE — Progress Notes (Signed)
Anesthesia Chart Review: ? ?Pt is a same day work up ? ? Case: [id number] Date/Time: 12/19/21 1300  ? Procedure: ROBOTIC ASSISTED NAVIGATIONAL BRONCHOSCOPY (Left) - ION w/CIOS  ? Anesthesia type: General  ? Diagnosis: Nodule of left lung [R91.1]  ? Pre-op diagnosis: lung nodule  ? [facility] / [facility]  ? Surgeons: Garner Nash, DO  ? ?  ? ? ?DISCUSSION: ?Pt is 76 years old with hx CAD (BMS to RCA and CTO Cx 5170), chronic systolic HF, cardiomyopathy (EF <20%), CKD, cirrhosis, alcohol abuse, dementia. Current smoker.  ? ?From Dr. Juline Patch notes, pt is not a surgical candidate due to comorbidities but plan is to have radiation treatments if malignancy is found.  ? ?Dr. Domenic Polite with cardiology notes on 06/05/21 "He has been remarkably stable from a clinical perspective, was in hospice program initially." ? ? ?PROVIDERS: ?- PCP is Celene Squibb, MD ?- Cardiologist is Rozann Lesches, MD. Last office visit 06/05/21 ? ? ?LABS: Will be obtained day of surgery  ?- Prior labs last summer show baseline Cr ~1.8 and normal liver function & normal coagulation ? ?IMAGES: ?CT super D chest 12/15/21:  ?1. Unchanged, spiculated subpleural nodule of the medial left lower lobe measuring 2.2 x 1.8 cm, previously hypermetabolic and consistent with primary lung malignancy.  ?2. No evidence of lymphadenopathy or metastatic disease in the chest. ?3. Mild pulmonary fibrosis in a pattern with apical to basal gradient, featuring irregular peripheral interstitial opacity, septal thickening, as well as traction bronchiectasis and areas of subpleural bronchiolectasis at the lung bases. Findings are categorized as probable UIP per consensus guidelines ?4. Background of extensive fine centrilobular nodularity, concentrated in the lung apices, most commonly seen in smoking-related respiratory bronchiolitis.  ?5. Emphysema. ?6. Coronary artery disease. ? ? ?EKG: Will be obtained day of surgery  ? ? ?CV: ?Echo 11/23/21:  ?1.  No significant change in EF since 01/07/20.  ?2. Left ventricular ejection fraction, by estimation, is <20%. The left ventricle has severely decreased function. The left ventricle demonstrates global hypokinesis. The left ventricular internal cavity size was severely dilated. Left ventricular diastolic parameters are consistent with Grade I diastolic dysfunction (impaired relaxation).  ?3. Right ventricular systolic function is normal. The right ventricular size is normal.  ?4. The mitral valve is abnormal. Mild mitral valve regurgitation. No evidence of mitral stenosis.  ?5. The aortic valve is tricuspid. Aortic valve regurgitation is not visualized. No aortic stenosis is present.  ?6. The inferior vena cava is normal in size with greater than 50% respiratory variability, suggesting right atrial pressure of 3 mmHg.  ? ? ?Past Medical History:  ?Diagnosis Date  ? CAD (coronary artery disease)   ? IMI in 5/07 with CTO mid cx, 60% LAD, 99% RCA-> BMS; mod. impaired LV function; EF:35-40% 2/10  ? Cardiomyopathy ([place])   ? Cirrhosis ([place])   ? History of excessive alcohol use  ? CKD (chronic kidney disease) stage 4, GFR 15-29 ml/min (HCC)   ? Dementia ([place])   ? Gastroesophageal reflux disease   ? Hyperlipidemia   ? ? ?Past Surgical History:  ?Procedure Laterality Date  ? BIOPSY  12/05/2018  ? Procedure: BIOPSY;  Surgeon: Rogene Houston, MD;  [facility];  Service: Endoscopy;;  gastric  ? BIOPSY  05/09/2021  ? Procedure: BIOPSY;  Surgeon: Montez Morita, Quillian Quince, MD;  [facility];  Service: Gastroenterology;;  ? COLONOSCOPY WITH PROPOFOL N/A 05/09/2021  ? Procedure: COLONOSCOPY WITH  PROPOFOL;  Surgeon: Montez Morita, Quillian Quince, MD;  [facility];  Service: Gastroenterology;  Laterality: N/A;  12:30  ? ESOPHAGOGASTRODUODENOSCOPY N/A 12/19/2016  ? Procedure: ESOPHAGOGASTRODUODENOSCOPY (EGD);  Surgeon: Rogene Houston, MD;  [facility];  Service: Endoscopy;  Laterality: N/A;  ?  ESOPHAGOGASTRODUODENOSCOPY N/A 12/21/2016  ? Procedure: ESOPHAGOGASTRODUODENOSCOPY (EGD);  Surgeon: Danie Binder, MD;  [facility];  Service: Endoscopy;  Laterality: N/A;  ? ESOPHAGOGASTRODUODENOSCOPY (EGD) WITH PROPOFOL N/A 12/22/2016  ? Procedure: ESOPHAGOGASTRODUODENOSCOPY (EGD) WITH PROPOFOL;  Surgeon: Mauri Pole, MD;  Location: [facility];  Service: Endoscopy;  Laterality: N/A;  ? ESOPHAGOGASTRODUODENOSCOPY (EGD) WITH PROPOFOL N/A 12/05/2018  ? Procedure: ESOPHAGOGASTRODUODENOSCOPY (EGD) WITH PROPOFOL;  Surgeon: Rogene Houston, MD;  [facility];  Service: Endoscopy;  Laterality: N/A;  1:35  ? ESOPHAGOGASTRODUODENOSCOPY (EGD) WITH PROPOFOL N/A 05/09/2021  ? Procedure: ESOPHAGOGASTRODUODENOSCOPY (EGD) WITH PROPOFOL;  Surgeon: Harvel Quale, MD;  [facility];  Service: Gastroenterology;  Laterality: N/A;  ? POLYPECTOMY  05/09/2021  ? Procedure: POLYPECTOMY;  Surgeon: Montez Morita, Quillian Quince, MD;  [facility];  Service: Gastroenterology;;  ? ? ?MEDICATIONS: ?No current facility-administered medications for this encounter.  ? ? allopurinol (ZYLOPRIM) 100 MG tablet  ? atorvastatin (LIPITOR) 20 MG tablet  ? carvedilol (COREG) 7.47 MG tablet  ? folic acid (FOLVITE) 1 MG tablet  ? furosemide (LASIX) 40 MG tablet  ? hydrALAZINE (APRESOLINE) 10 MG tablet  ? isosorbide dinitrate (ISORDIL) 10 MG tablet  ? Magnesium Oxide 400 (240 Mg) MG TABS  ? Multiple Vitamins-Minerals (MULTIVITAMIN WITH MINERALS) tablet  ? nitroGLYCERIN (NITROSTAT) 0.4 MG SL tablet  ? pantoprazole (PROTONIX) 40 MG tablet  ? potassium chloride (KLOR-CON) 10 MEQ tablet  ? senna-docusate (SENOKOT-S) 8.6-50 MG tablet  ? spironolactone (ALDACTONE) 25 MG tablet  ? vitamin B-12 (CYANOCOBALAMIN) 100 MCG tablet  ? ? ?Pt will need further evaluation by assigned anesthesiologist day of surgery. If no acute changes, I anticipate pt can proceed with surgery as scheduled.  ? ?Willeen Cass, PhD,  FNP-BC ?[facility]/Anesthesiology ?Phone: [phone] ?12/18/2021 10:22 AM ? ? ? ? ? ? ? ?

## 2021-12-18 NOTE — Anesthesia Preprocedure Evaluation (Addendum)
Anesthesia Evaluation  ?Patient identified by MRN, date of birth, ID band ?Patient awake ? ? ? ?Reviewed: ?Allergy & Precautions, H&P , NPO status , Patient's Chart, lab work & pertinent test results, reviewed documented beta blocker date and time  ? ?Airway ?Mallampati: I ? ?TM Distance: >3 FB ?Neck ROM: full ? ? ? Dental ?no notable dental hx. ?(+) Poor Dentition, Chipped, Missing, Loose,  ?  ?Pulmonary ?neg pulmonary ROS, Current Smoker and Patient abstained from smoking.,  ?  ?Pulmonary exam normal ?breath sounds clear to auscultation ? ? ? ? ? ? Cardiovascular ?Exercise Tolerance: Good ?+ CAD, +CHF and + DOE  ? ?Rhythm:regular Rate:Normal ? ?Echo 11/23/21:  ?1. No significant change in EF since 01/07/20.  ?2. Left ventricular ejection fraction, by estimation, is <20%. The left ventricle has severely decreased function. The left ventricle demonstrates global hypokinesis. The left ventricular internal cavity size was severely dilated. Left ventricular diastolic parameters are consistent with Grade I diastolic dysfunction (impaired relaxation).  ?3. Right ventricular systolic function is normal. The right ventricular size is normal.  ?4. The mitral valve is abnormal. Mild mitral valve regurgitation. No evidence of mitral stenosis.  ?5. The aortic valve is tricuspid. Aortic valve regurgitation is not visualized. No aortic stenosis is present.  ?6. The inferior vena cava is normal in size with greater than 50% respiratory variability, suggesting right atrial pressure of 3 mmHg.  ?  ?Neuro/Psych ?PSYCHIATRIC DISORDERS Dementia negative neurological ROS ?   ? GI/Hepatic ?negative GI ROS, (+) Cirrhosis  ?  ? substance abuse ? alcohol use,   ?Endo/Other  ?negative endocrine ROS ? Renal/GU ?CRFRenal disease  ?negative genitourinary ?  ?Musculoskeletal ? ?(+) Arthritis , Osteoarthritis,   ? Abdominal ?  ?Peds ? Hematology ? ?(+) Blood dyscrasia, anemia ,   ?Anesthesia Other Findings ? ?  Reproductive/Obstetrics ?negative OB ROS ? ?  ? ? ? ? ? ? ? ? ? ? ? ? ? ?  ?  ? ? ? ? ? ? ? ?Anesthesia Physical ?Anesthesia Plan ? ?ASA: 4 ? ?Anesthesia Plan: General  ? ?Post-op Pain Management: Minimal or no pain anticipated  ? ?Induction: Intravenous ? ?PONV Risk Score and Plan: 2 and Ondansetron ? ?Airway Management Planned: Oral ETT ? ?Additional Equipment: None ? ?Intra-op Plan:  ? ?Post-operative Plan:  ? ?Informed Consent: I have reviewed the patients History and Physical, chart, labs and discussed the procedure including the risks, benefits and alternatives for the proposed anesthesia with the patient or authorized representative who has indicated his/her understanding and acceptance.  ? ? ? ?Dental Advisory Given ? ?Plan Discussed with: CRNA and Anesthesiologist ? ?Anesthesia Plan Comments: (See APP note by Durel Salts, FNP. EF <20%.  ?Pt is 76 years old with hx CAD (BMS to RCA and CTO Cx 2751), chronic systolic HF, cardiomyopathy (EF <20%), CKD, cirrhosis, alcohol abuse, dementia. Current smoker.  ?? ?From Dr. Juline Patch notes, pt is not a surgical candidate due to comorbidities but plan is to have radiation treatments if malignancy is found. )  ? ? ? ? ?Anesthesia Quick Evaluation ? ?

## 2021-12-19 ENCOUNTER — Encounter (HOSPITAL_COMMUNITY): Payer: Self-pay | Admitting: Pulmonary Disease

## 2021-12-19 ENCOUNTER — Ambulatory Visit (HOSPITAL_BASED_OUTPATIENT_CLINIC_OR_DEPARTMENT_OTHER): Payer: Medicare Other | Admitting: Emergency Medicine

## 2021-12-19 ENCOUNTER — Encounter (HOSPITAL_COMMUNITY)
Admission: RE | Disposition: A | Discharge status: Home / Self Care | Payer: Self-pay | Source: Home / Self Care | Attending: Pulmonary Disease

## 2021-12-19 ENCOUNTER — Ambulatory Visit (HOSPITAL_COMMUNITY)
Admission: RE | Admit: 2021-12-19 | Discharge: 2021-12-19 | Disposition: A | Discharge status: Home / Self Care | Payer: Medicare Other | Attending: Pulmonary Disease | Admitting: Pulmonary Disease

## 2021-12-19 ENCOUNTER — Ambulatory Visit (HOSPITAL_COMMUNITY): Payer: Medicare Other

## 2021-12-19 ENCOUNTER — Ambulatory Visit (HOSPITAL_COMMUNITY): Payer: Medicare Other | Admitting: Emergency Medicine

## 2021-12-19 DIAGNOSIS — R911 Solitary pulmonary nodule: Secondary | ICD-10-CM

## 2021-12-19 DIAGNOSIS — C3432 Malignant neoplasm of lower lobe, left bronchus or lung: Secondary | ICD-10-CM | POA: Diagnosis not present

## 2021-12-19 DIAGNOSIS — I5043 Acute on chronic combined systolic (congestive) and diastolic (congestive) heart failure: Secondary | ICD-10-CM | POA: Diagnosis not present

## 2021-12-19 DIAGNOSIS — E785 Hyperlipidemia, unspecified: Secondary | ICD-10-CM | POA: Insufficient documentation

## 2021-12-19 DIAGNOSIS — K7460 Unspecified cirrhosis of liver: Secondary | ICD-10-CM | POA: Diagnosis not present

## 2021-12-19 DIAGNOSIS — R846 Abnormal cytological findings in specimens from respiratory organs and thorax: Secondary | ICD-10-CM | POA: Diagnosis not present

## 2021-12-19 DIAGNOSIS — I5042 Chronic combined systolic (congestive) and diastolic (congestive) heart failure: Secondary | ICD-10-CM

## 2021-12-19 DIAGNOSIS — N184 Chronic kidney disease, stage 4 (severe): Secondary | ICD-10-CM | POA: Diagnosis not present

## 2021-12-19 DIAGNOSIS — F17210 Nicotine dependence, cigarettes, uncomplicated: Secondary | ICD-10-CM | POA: Diagnosis not present

## 2021-12-19 DIAGNOSIS — I2510 Atherosclerotic heart disease of native coronary artery without angina pectoris: Secondary | ICD-10-CM | POA: Insufficient documentation

## 2021-12-19 DIAGNOSIS — I255 Ischemic cardiomyopathy: Secondary | ICD-10-CM | POA: Diagnosis not present

## 2021-12-19 DIAGNOSIS — Z01818 Encounter for other preprocedural examination: Secondary | ICD-10-CM | POA: Diagnosis not present

## 2021-12-19 HISTORY — PX: BRONCHIAL NEEDLE ASPIRATION BIOPSY: SHX5106

## 2021-12-19 HISTORY — PX: BRONCHIAL BIOPSY: SHX5109

## 2021-12-19 HISTORY — PX: BRONCHIAL BRUSHINGS: SHX5108

## 2021-12-19 HISTORY — PX: VIDEO BRONCHOSCOPY WITH RADIAL ENDOBRONCHIAL ULTRASOUND: SHX6849

## 2021-12-19 HISTORY — PX: FIDUCIAL MARKER PLACEMENT: SHX6858

## 2021-12-19 LAB — COMPREHENSIVE METABOLIC PANEL
ALT: 13 U/L (ref 0–44)
AST: 44 U/L — ABNORMAL HIGH (ref 15–41)
Albumin: 2.8 g/dL — ABNORMAL LOW (ref 3.5–5.0)
Alkaline Phosphatase: 50 U/L (ref 38–126)
Anion gap: 10 (ref 5–15)
BUN: 15 mg/dL (ref 8–23)
CO2: 20 mmol/L — ABNORMAL LOW (ref 22–32)
Calcium: 8.6 mg/dL — ABNORMAL LOW (ref 8.9–10.3)
Chloride: 104 mmol/L (ref 98–111)
Creatinine, Ser: 1.82 mg/dL — ABNORMAL HIGH (ref 0.61–1.24)
GFR, Estimated: 38 mL/min — ABNORMAL LOW (ref >60)
Glucose, Bld: 93 mg/dL (ref 70–99)
Potassium: 4.8 mmol/L (ref 3.5–5.1)
Sodium: 134 mmol/L — ABNORMAL LOW (ref 135–145)
Total Bilirubin: 1.1 mg/dL (ref 0.3–1.2)
Total Protein: 9 g/dL — ABNORMAL HIGH (ref 6.5–8.1)

## 2021-12-19 LAB — CBC
HCT: 25 % — ABNORMAL LOW (ref 39.0–52.0)
Hemoglobin: 9.2 g/dL — ABNORMAL LOW (ref 13.0–17.0)
MCH: 35.8 pg — ABNORMAL HIGH (ref 26.0–34.0)
MCHC: 36.8 g/dL — ABNORMAL HIGH (ref 30.0–36.0)
MCV: 97.3 fL (ref 80.0–100.0)
Platelets: 142 10*3/uL — ABNORMAL LOW (ref 150–400)
RBC: 2.57 MIL/uL — ABNORMAL LOW (ref 4.22–5.81)
RDW: 15.5 % (ref 11.5–15.5)
WBC: 4.7 10*3/uL (ref 4.0–10.5)
nRBC: 0 % (ref 0.0–0.2)

## 2021-12-19 SURGERY — BRONCHOSCOPY, WITH BIOPSY USING ELECTROMAGNETIC NAVIGATION
Anesthesia: General | Laterality: Left

## 2021-12-19 MED ORDER — FENTANYL CITRATE (PF) 100 MCG/2ML IJ SOLN: 25.0000 ug | INTRAMUSCULAR | Status: DC | PRN

## 2021-12-19 MED ORDER — OXYCODONE HCL 5 MG PO TABS: 5.0000 mg | ORAL_TABLET | Freq: Once | ORAL | Status: DC | PRN

## 2021-12-19 MED ORDER — ACETAMINOPHEN 325 MG PO TABS: 325.0000 mg | ORAL_TABLET | ORAL | Status: DC | PRN

## 2021-12-19 MED ORDER — PHENYLEPHRINE HCL-NACL 20-0.9 MG/250ML-% IV SOLN
INTRAVENOUS | Status: DC | PRN
  Administered 2021-12-19: 40 ug/min via INTRAVENOUS

## 2021-12-19 MED ORDER — DEXAMETHASONE SODIUM PHOSPHATE 10 MG/ML IJ SOLN
INTRAMUSCULAR | Status: DC | PRN
  Administered 2021-12-19: 8 mg via INTRAVENOUS

## 2021-12-19 MED ORDER — LACTATED RINGERS IV SOLN: INTRAVENOUS | Status: DC

## 2021-12-19 MED ORDER — MEPERIDINE HCL 25 MG/ML IJ SOLN: 6.2500 mg | INTRAMUSCULAR | Status: DC | PRN

## 2021-12-19 MED ORDER — SUGAMMADEX SODIUM 200 MG/2ML IV SOLN
INTRAVENOUS | Status: DC | PRN
  Administered 2021-12-19: 200 mg via INTRAVENOUS

## 2021-12-19 MED ORDER — ONDANSETRON HCL 4 MG/2ML IJ SOLN: 4.0000 mg | Freq: Once | INTRAMUSCULAR | Status: DC | PRN

## 2021-12-19 MED ORDER — ACETAMINOPHEN 160 MG/5ML PO SOLN: 325.0000 mg | ORAL | Status: DC | PRN

## 2021-12-19 MED ORDER — ROCURONIUM BROMIDE 10 MG/ML (PF) SYRINGE
PREFILLED_SYRINGE | INTRAVENOUS | Status: DC | PRN
Start: 2021-12-19 — End: 2021-12-19
  Administered 2021-12-19: 40 mg via INTRAVENOUS
  Administered 2021-12-19: 10 mg via INTRAVENOUS

## 2021-12-19 MED ORDER — PROPOFOL 10 MG/ML IV BOLUS
INTRAVENOUS | Status: DC | PRN
  Administered 2021-12-19: 60 mg via INTRAVENOUS

## 2021-12-19 MED ORDER — LIDOCAINE 2% (20 MG/ML) 5 ML SYRINGE
INTRAMUSCULAR | Status: DC | PRN
Start: 2021-12-19 — End: 2021-12-19
  Administered 2021-12-19: 60 mg via INTRAVENOUS

## 2021-12-19 MED ORDER — ONDANSETRON HCL 4 MG/2ML IJ SOLN
INTRAMUSCULAR | Status: DC | PRN
  Administered 2021-12-19: 4 mg via INTRAVENOUS

## 2021-12-19 MED ORDER — FENTANYL CITRATE (PF) 250 MCG/5ML IJ SOLN: INTRAMUSCULAR | Status: DC | PRN

## 2021-12-19 MED ORDER — OXYCODONE HCL 5 MG/5ML PO SOLN: 5.0000 mg | Freq: Once | ORAL | Status: DC | PRN

## 2021-12-19 MED ORDER — FENTANYL CITRATE (PF) 100 MCG/2ML IJ SOLN
INTRAMUSCULAR | Status: DC | PRN
  Administered 2021-12-19: 50 ug via INTRAVENOUS
  Administered 2021-12-19 (×2): 25 ug via INTRAVENOUS

## 2021-12-19 MED ORDER — CHLORHEXIDINE GLUCONATE 0.12 % MT SOLN
OROMUCOSAL | Status: AC
  Filled 2021-12-19: qty 15

## 2021-12-19 SURGICAL SUPPLY — 1 items: Superlock fiducial marker ×1 IMPLANT

## 2021-12-19 NOTE — Anesthesia Postprocedure Evaluation (Signed)
Anesthesia Post Note ? ?Patient: [name] ? ?Procedure(s) Performed: ROBOTIC ASSISTED NAVIGATIONAL BRONCHOSCOPY (Left) ?RADIAL ENDOBRONCHIAL ULTRASOUND ?BRONCHIAL NEEDLE ASPIRATION BIOPSIES ?BRONCHIAL BRUSHINGS ?BRONCHIAL BIOPSIES ?FIDUCIAL MARKER PLACEMENT ? ?  ? ?Patient location during evaluation: PACU ?Anesthesia Type: General ?Level of consciousness: awake and alert ?Pain management: pain level controlled ?Vital Signs Assessment: post-procedure vital signs reviewed and stable ?Respiratory status: spontaneous breathing, nonlabored ventilation and respiratory function stable ?Cardiovascular status: stable and blood pressure returned to baseline ?Anesthetic complications: no ? ? ?No notable events documented. ? ?Last Vitals:  ?Vitals:  ? 12/19/21 1405 12/19/21 1420  ?BP: 109/62 (!) 105/56  ?Pulse: 79 75  ?Resp: 15 13  ?Temp:  36.7 ?C  ?SpO2: 95% 98%  ?  ?Last Pain:  ?Vitals:  ? 12/19/21 1045  ?TempSrc:   ?PainSc: 0-No pain  ? ? ?  ?  ?  ?  ?  ?  ? ?Audry Pili ? ? ? ? ?

## 2021-12-19 NOTE — Op Note (Signed)
Video Bronchoscopy with Robotic Assisted Bronchoscopic Navigation  ? ?Date of Operation: 12/19/2021  ? ?Pre-op Diagnosis: Lung nodule, LLL ? ?Post-op Diagnosis: Lung nodule, LLL ? ?Surgeon: Garner Nash, DO ? ?Assistants: None  ? ?Anesthesia: General endotracheal anesthesia ? ?Operation: Flexible video fiberoptic bronchoscopy with robotic assistance and biopsies. ? ?Estimated Blood Loss: Minimal ? ?Complications: None ? ?Indications and History: ?[name] is a 76 y.o. male with history of lung nodule. The risks, benefits, complications, treatment options and expected outcomes were discussed with the patient.  The possibilities of pneumothorax, pneumonia, reaction to medication, pulmonary aspiration, perforation of a viscus, bleeding, failure to diagnose a condition and creating a complication requiring transfusion or operation were discussed with the patient who freely signed the consent.   ? ?Description of Procedure: ?The patient was seen in the Preoperative Area, was examined and was deemed appropriate to proceed.  The patient was taken to [facility] endoscopy room 3, identified as [name] and the procedure verified as Flexible Video Fiberoptic Bronchoscopy.  A Time Out was held and the above information confirmed.  ? ?Prior to the date of the procedure a high-resolution CT scan of the chest was performed. Utilizing ION software program a virtual tracheobronchial tree was generated to allow the creation of distinct navigation pathways to the patient's parenchymal abnormalities. After being taken to the operating room general anesthesia was initiated and the patient  was orally intubated. The video fiberoptic bronchoscope was introduced via the endotracheal tube and a general inspection was performed which showed normal right and left lung anatomy, aspiration of the bilateral mainstems was completed to remove any remaining secretions. Robotic catheter inserted into patient's endotracheal tube.   ? ?Target #1 LLL: ?The distinct navigation pathways prepared prior to this procedure were then utilized to navigate to patient's lesion identified on CT scan. The robotic catheter was secured into place and the vision probe was withdrawn.  Lesion location was approximated using fluoroscopy and radial endobronchial ultrasound for peripheral targeting. Under fluoroscopic guidance transbronchial needle brushings, transbronchial needle biopsies, and transbronchial forceps biopsies were performed to be sent for cytology and pathology. Following tissue biopsy a single fiducial was placed under fluoroscopic guidance.  ? ?At the end of the procedure a general airway inspection was performed and there was no evidence of active bleeding. The bronchoscope was removed.  The patient tolerated the procedure well. There was no significant blood loss and there were no obvious complications. A post-procedural chest x-ray is pending. ? ?Samples Target #1: ?1. Transbronchial needle brushings from LLL ?2. Transbronchial Wang needle biopsies from LLL ?3. Transbronchial forceps biopsies from LLL ? ?Plans:  ?The patient will be discharged from the PACU to home when recovered from anesthesia and after chest x-ray is reviewed. We will review the cytology, pathology results with the patient when they become available. Outpatient followup will be with Garner Nash, DO. ? ? ?Garner Nash, DO ?[facility] ?12/19/2021 2:05 PM    ? ?

## 2021-12-19 NOTE — Interval H&P Note (Signed)
History and Physical Interval Note: ? ?12/19/2021 ?12:29 PM ? ?[name]  has presented today for surgery, with the diagnosis of lung nodule.  The various methods of treatment have been discussed with the patient and family. After consideration of risks, benefits and other options for treatment, the patient has consented to  Procedure(s) with comments: ?ROBOTIC ASSISTED NAVIGATIONAL BRONCHOSCOPY (Left) - ION w/CIOS as a surgical intervention.  The patient's history has been reviewed, patient examined, no change in status, stable for surgery.  I have reviewed the patient's chart and labs.  Questions were answered to the patient's satisfaction.   ? ? ?[name] ? ? ?

## 2021-12-19 NOTE — Anesthesia Procedure Notes (Signed)
Procedure Name: Intubation ?Date/Time: 12/19/2021 12:47 PM ?Performed by: Mariea Clonts, CRNA ?Pre-anesthesia Checklist: Patient identified, Emergency Drugs available, Suction available and Patient being monitored ?Patient Re-evaluated:Patient Re-evaluated prior to induction ?Oxygen Delivery Method: Circle System Utilized ?Preoxygenation: Pre-oxygenation with 100% oxygen ?Induction Type: IV induction ?Ventilation: Mask ventilation without difficulty ?Laryngoscope Size: 2 ?Grade View: Grade I ?Tube type: Oral ?Tube size: 8.5 mm ?Number of attempts: 1 ?Airway Equipment and Method: Stylet and Oral airway ?Placement Confirmation: ETT inserted through vocal cords under direct vision, positive ETCO2 and breath sounds checked- equal and bilateral ?Secured at: 21 cm ?Tube secured with: Tape ?Dental Injury: Teeth and Oropharynx as per pre-operative assessment  ? ? ? ? ?

## 2021-12-19 NOTE — Transfer of Care (Signed)
Immediate Anesthesia Transfer of Care Note ? ?Patient: [name] ? ?Procedure(s) Performed: ROBOTIC ASSISTED NAVIGATIONAL BRONCHOSCOPY (Left) ?RADIAL ENDOBRONCHIAL ULTRASOUND ?BRONCHIAL NEEDLE ASPIRATION BIOPSIES ?BRONCHIAL BRUSHINGS ?BRONCHIAL BIOPSIES ?FIDUCIAL MARKER PLACEMENT ? ?Patient Location: PACU ? ?Anesthesia Type:General ? ?Level of Consciousness: awake, alert  and oriented ? ?Airway & Oxygen Therapy: Patient Spontanous Breathing and Patient connected to face mask oxygen ? ?Post-op Assessment: Report given to RN and Post -op Vital signs reviewed and stable ? ?Post vital signs: Reviewed and stable ? ?Last Vitals:  ?Vitals Value Taken Time  ?BP 119/75 12/19/21 1350  ?Temp 36.9 ?C 12/19/21 1350  ?Pulse 86 12/19/21 1353  ?Resp 18 12/19/21 1353  ?SpO2 100 % 12/19/21 1353  ?Vitals shown include unvalidated device data. ? ?Last Pain:  ?Vitals:  ? 12/19/21 1045  ?TempSrc:   ?PainSc: 0-No pain  ?   ? ?  ? ?Complications: No notable events documented. ?

## 2021-12-19 NOTE — Discharge Instructions (Signed)
Flexible Bronchoscopy, Care After ?This sheet gives you information about how to care for yourself after your test. Your doctor may also give you more specific instructions. If you have problems or questions, contact your doctor. ?Follow these instructions at home: ?Eating and drinking ?Do not eat or drink anything (not even water) for 2 hours after your test, or until your numbing medicine (local anesthetic) wears off. ?When your numbness is gone and your cough and gag reflexes have come back, you may: ?Eat only soft foods. ?Slowly drink liquids. ?The day after the test, go back to your normal diet. ?Driving ?Do not drive for 24 hours if you were given a medicine to help you relax (sedative). ?Do not drive or use heavy machinery while taking prescription pain medicine. ?General instructions ? ?Take over-the-counter and prescription medicines only as told by your doctor. ?Return to your normal activities as told. Ask what activities are safe for you. ?Do not use any products that have nicotine or tobacco in them. This includes cigarettes and e-cigarettes. If you need help quitting, ask your doctor. ?Keep all follow-up visits as told by your doctor. This is important. It is very important if you had a tissue sample (biopsy) taken. ?Get help right away if: ?You have shortness of breath that gets worse. ?You get light-headed. ?You feel like you are going to pass out (faint). ?You have chest pain. ?You cough up: ?More than a little blood. ?More blood than before. ?Summary ?Do not eat or drink anything (not even water) for 2 hours after your test, or until your numbing medicine wears off. ?Do not use cigarettes. Do not use e-cigarettes. ?Get help right away if you have chest pain. ? ?This information is not intended to replace advice given to you by your health care provider. Make sure you discuss any questions you have with your health care provider. ?Document Released: 07/01/2009 Document Revised: 08/16/2017 Document  Reviewed: 09/21/2016 ?Elsevier Patient Education ? [place]. ? ?

## 2021-12-20 ENCOUNTER — Encounter (HOSPITAL_COMMUNITY): Payer: Self-pay | Admitting: Pulmonary Disease

## 2021-12-21 LAB — CYTOLOGY - NON PAP

## 2021-12-26 ENCOUNTER — Ambulatory Visit (INDEPENDENT_AMBULATORY_CARE_PROVIDER_SITE_OTHER): Payer: Medicare Other | Admitting: Acute Care

## 2021-12-26 ENCOUNTER — Encounter: Payer: Self-pay | Admitting: Acute Care

## 2021-12-26 VITALS — BP 128/68 | HR 85 | Temp 98.0°F | Ht 62.0 in | Wt 122.4 lb

## 2021-12-26 DIAGNOSIS — C3492 Malignant neoplasm of unspecified part of left bronchus or lung: Secondary | ICD-10-CM

## 2021-12-26 DIAGNOSIS — F17200 Nicotine dependence, unspecified, uncomplicated: Secondary | ICD-10-CM | POA: Diagnosis not present

## 2021-12-26 NOTE — Patient Instructions (Addendum)
It is good to see you today. ?Your Biopsy was positive for Squamous Cell Carcinoma  ?You have been referred to Dr. Tammi Klippel at radiation oncology.  ?You have a telephone visit with Dr. Tammi Klippel and his nurse on 12/28/2021. ?They will give you more specifics on treatment plan and answer any questions you have.  ?Follow up with Dr. Domenic Polite in July as is scheduled. ?Please work on quitting smoking.  ?Let us know if you need [place].  ?Please contact office for sooner follow up if symptoms do not improve or worsen or seek emergency care   ? ?

## 2021-12-26 NOTE — Progress Notes (Incomplete)
Thoracic Location of Tumor / Histology: Left Lower Lobe Lung Ca ? ?Biopsies: ?12/19/2021 ?Dr. Valeta Harms ? ?Target #1 LLL: ?The distinct navigation pathways prepared prior to this procedure were then utilized to navigate to patient's lesion identified on CT scan. The robotic catheter was secured into place and the vision probe was withdrawn.  Lesion location was approximated using fluoroscopy and radial endobronchial ultrasound for peripheral targeting. Under fluoroscopic guidance transbronchial needle brushings, transbronchial needle biopsies, and transbronchial forceps biopsies were performed to be sent for cytology and pathology. Following tissue biopsy a single fiducial was placed under fluoroscopic guidance.  ?  ?Tobacco/Marijuana/Snuff/ETOH use: Yes, tobacco smoker, no smokeless or drug use. Not currently use of alcohol. ? ?Past/Anticipated interventions by cardiothoracic surgery, if any: Biopsy Lung (12/19/2021) ? ?Past/Anticipated interventions by medical oncology, if any: NA ? ?Signs/Symptoms ?Weight changes, if any:  ?Respiratory complaints, if any:  ?Hemoptysis, if any:  ?Pain issues, if any:   ? ?SAFETY ISSUES: ?Prior radiation?  ?Pacemaker/ICD?   ?Possible current pregnancy?  Male ?Is the patient on methotrexate?  No ? ?Current Complaints / other details:    ?

## 2021-12-26 NOTE — Progress Notes (Signed)
? ?History of Present Illness ?[name] is a 76 y.o. male with  past medical history of coronary disease, ischemic cardiomyopathy, longstanding history of alcohol use, tobacco use, cirrhosis, hyperlipidemia.  Patient had a lung cancer screening CT that showed a new left lower lobe pulmonary nodule followed by nuclear medicine PET scan.Patient's nuclear medicine PET scan was on 11/09/2021 which revealed a 2 cm left lower lobe hypermetabolic lung nodule concerning for a primary lung lesion/primary bronchogenic carcinoma. He was seen by Dr. Valeta Harms for  navigational bronchoscopy tissue sampling, fiducial placement and then subsequent referral to radiation oncology for SBRT treatments.  Felt not to be a surgical candidate due to his ischemic cardiomyopathy and other multiple medical comorbidities.He underwent  ?Flexible video fiberoptic bronchoscopy with robotic assistance and biopsies on 12/19/2021.  ? ?12/26/2021 ?Pt. Presents for follow up after Flexible video fiberoptic bronchoscopy with robotic assistance and biopsies on 12/19/2021. [name] He has done well post procedure. No hemoptysis,  no sore throat, no fevers. He states his breathing is good. He does not experience any shortness of breath. He does not use any inhalers. ?  ?We have reviewed his biopsy results. I explained that his biopsies were positive for Malignant cells present consistent with a Squamous Cell Carcinoma . He has follow up scheduled with Dr. Tammi Klippel , and his nurse on 12/28/2021 for a tele visit to plan treatment. We did discuss the option or radiation treatment, and he is in agreement with the plan. I explained that Dr. Tammi Klippel and his nurse will give him more details about his options at his upcoming tele visit.   ? ? ?Test Results: ?Biopsies 12/19/2021 ?FINAL MICROSCOPIC DIAGNOSIS:  ?A. LUNG, LLL, FINE NEEDLE ASPIRATION AND BIOPSIES:  ?- Malignant cells present consistent with a Squamous Cell Carcinoma  ? ?B. LUNG, LLL, BRUSH:  ?- No malignant  cells identified  ?- Very scant cellularity; scattered benign bronchial cells ? ?12/15/2021 Super D CT Chest ?Unchanged, spiculated subpleural nodule of the medial left lower ?lobe measuring 2.2 x 1.8 cm, previously hypermetabolic and ?consistent with primary lung malignancy. ?No evidence of lymphadenopathy or metastatic disease in the ?chest. ?Mild pulmonary fibrosis in a pattern with apical to basal ?gradient, featuring irregular peripheral interstitial opacity, ?septal thickening, as well as traction bronchiectasis and areas of ?subpleural bronchiolectasis at the lung bases. Findings are ?categorized as probable UIP  ?Background of extensive fine centrilobular nodularity, ?concentrated in the lung apices, most commonly seen in ?smoking-related respiratory bronchiolitis. ?Emphysema. ?Coronary artery disease. ? ?11/09/2021 PET Scan  ?Hypermetabolic 2 cm left lower lobe lung lesion consistent with ?primary lung neoplasm. ?2. No mediastinal or hilar metastatic adenopathy or metastatic ?disease elsewhere. ?3. Stable lung disease and vascular disease. ? ? ?10/20/2021 CT Chest Lung Cancer Screening ?Centrilobular and paraseptal emphysema. Smoking ?related respiratory bronchiolitis. New spiculated nodule in the ?medial left lower lobe measures 22.2 mm (4/235) and is new from ?11/05/2018. Additional scattered pulmonary nodules measure 5.8 mm or ?less in size. Basilar predominant subpleural reticulation, ?ground-glass and traction bronchiolectasis, progressive from ?11/05/2018. No pleural fluid. Debris is seen in the airway. ? ? ? ?  Latest Ref Rng & Units 12/19/2021  ? 11:10 AM 04/18/2021  ?  9:06 AM 01/08/2020  ?  5:04 AM  ?CBC  ?WBC 4.0 - 10.5 K/uL 4.7   5.6   8.3    ?Hemoglobin 13.0 - 17.0 g/dL 9.2   11.5   10.8    ?Hematocrit 39.0 - 52.0 % 25.0   32.8  33.4    ?Platelets 150 - 400 K/uL 142   166   179    ? ? ? ?  Latest Ref Rng & Units 12/19/2021  ? 11:10 AM 05/05/2021  ? 10:34 AM 04/18/2021  ?  9:06 AM  ?BMP  ?Glucose 70 - 99  mg/dL 93   120   93    ?BUN 8 - 23 mg/dL 15   17   18     ?Creatinine 0.61 - 1.24 mg/dL 1.82   1.82   1.81    ?BUN/Creat Ratio 6 - 22 (calc)   10    ?Sodium 135 - 145 mmol/L 134   135   137    ?Potassium 3.5 - 5.1 mmol/L 4.8   3.9   4.2    ?Chloride 98 - 111 mmol/L 104   101   102    ?CO2 22 - 32 mmol/L 20   26   26     ?Calcium 8.9 - 10.3 mg/dL 8.6   9.1   9.5    ? ? ?BNP ?   ?Component Value Date/Time  ? BNP 1,603.0 (H) 01/07/2020 0602  ? ? ?ProBNP ?No results found for: PROBNP ? ?PFT ?No results found for: FEV1PRE, FEV1POST, FVCPRE, FVCPOST, TLC, DLCOUNC, PREFEV1FVCRT, PSTFEV1FVCRT ? ?DG Chest Port 1 View ? ?Result Date: 12/19/2021 ?CLINICAL DATA:  Preop imaging EXAM: PORTABLE CHEST 1 VIEW COMPARISON:  Chest x-ray dated January 08, 2020 FINDINGS: Unchanged cardiomegaly. Lower lung predominant interstitial opacities. No focal consolidation. Possible trace bilateral pleural effusions. No evidence of pneumothorax. The visualized skeletal structures are unremarkable. IMPRESSION: Lower lung predominant interstitial opacities, likely due to underlying interstitial lung disease with superimposed mild pulmonary edema. Electronically Signed   By: Yetta Glassman M.D.   On: 12/19/2021 14:43  ? ?DG C-Arm 1-60 Min-No Report ? ?Result Date: 12/19/2021 ?Fluoroscopy was utilized by the requesting physician.  No radiographic interpretation.  ? ?CT Super D Chest Wo Contrast ? ?Result Date: 12/16/2021 ?CLINICAL DATA:  Lung nodule, bronchoscopy planning * Tracking Code: BO * EXAM: CT CHEST WITHOUT CONTRAST TECHNIQUE: Multidetector CT imaging of the chest was performed using thin slice collimation for electromagnetic bronchoscopy planning purposes, without intravenous contrast. RADIATION DOSE REDUCTION: This exam was performed according to the departmental dose-optimization program which includes automated exposure control, adjustment of the mA and/or kV according to patient size and/or use of iterative reconstruction technique. COMPARISON:   PET-CT, 11/09/2021 FINDINGS: Cardiovascular: Aortic atherosclerosis. Cardiomegaly. Three-vessel coronary artery calcifications. No pericardial effusion. Mediastinum/Nodes: No enlarged mediastinal, hilar, or axillary lymph nodes. Thyroid gland, trachea, and esophagus demonstrate no significant findings. Lungs/Pleura: Mild centrilobular and paraseptal emphysema. Background of extensive fine centrilobular nodularity, concentrated in the lung apices. Mild pulmonary fibrosis in a pattern with apical to basal gradient, featuring irregular peripheral interstitial opacity, septal thickening, as well as traction bronchiectasis and areas of subpleural bronchiolectasis at the lung bases. Unchanged, spiculated subpleural nodule of the medial left lower lobe measuring 2.2 x 1.8 cm, previously hypermetabolic (series 7, image 123). Trace bilateral pleural effusions. Upper Abdomen: No acute abnormality. Asymmetric atrophy of the right kidney. Musculoskeletal: No chest wall abnormality. No suspicious osseous lesions identified. IMPRESSION: 1. Unchanged, spiculated subpleural nodule of the medial left lower lobe measuring 2.2 x 1.8 cm, previously hypermetabolic and consistent with primary lung malignancy. 2. No evidence of lymphadenopathy or metastatic disease in the chest. 3. Mild pulmonary fibrosis in a pattern with apical to basal gradient, featuring irregular peripheral interstitial opacity, septal thickening, as well as  traction bronchiectasis and areas of subpleural bronchiolectasis at the lung bases. Findings are categorized as probable UIP per consensus guidelines: Diagnosis of Idiopathic Pulmonary Fibrosis: An Official ATS/ERS/JRS/ALAT Clinical Practice Guideline. [place], Iss 5, [id number], May 18 2017. 4. Background of extensive fine centrilobular nodularity, concentrated in the lung apices, most commonly seen in smoking-related respiratory bronchiolitis. 5. Emphysema. 6. Coronary artery disease.  Aortic Atherosclerosis (ICD10-I70.0) and Emphysema (ICD10-J43.9). Electronically Signed   By: Delanna Ahmadi M.D.   On: 12/16/2021 15:15  ? ?DG C-ARM BRONCHOSCOPY ? ?Result Date: 12/19/2021 ?C-ARM BRONCHOSCOPY

## 2021-12-28 ENCOUNTER — Ambulatory Visit: Payer: Medicare Other

## 2021-12-28 ENCOUNTER — Telehealth: Payer: Self-pay | Admitting: Radiation Oncology

## 2021-12-28 ENCOUNTER — Other Ambulatory Visit: Payer: Self-pay | Admitting: *Deleted

## 2021-12-28 ENCOUNTER — Encounter: Payer: Self-pay | Admitting: *Deleted

## 2021-12-28 ENCOUNTER — Encounter: Payer: Self-pay | Admitting: Radiation Oncology

## 2021-12-28 ENCOUNTER — Ambulatory Visit
Admission: RE | Admit: 2021-12-28 | Discharge: 2021-12-28 | Disposition: A | Discharge status: Home / Self Care | Payer: Medicare Other | Source: Ambulatory Visit | Attending: Radiation Oncology | Admitting: Radiation Oncology

## 2021-12-28 ENCOUNTER — Ambulatory Visit: Payer: Medicare Other | Admitting: Radiation Oncology

## 2021-12-28 DIAGNOSIS — C3492 Malignant neoplasm of unspecified part of left bronchus or lung: Secondary | ICD-10-CM

## 2021-12-28 DIAGNOSIS — I10 Essential (primary) hypertension: Secondary | ICD-10-CM | POA: Diagnosis not present

## 2021-12-28 HISTORY — DX: Malignant neoplasm of unspecified part of left bronchus or lung: C34.92

## 2021-12-28 NOTE — Progress Notes (Signed)
Patient's case discussed at TB today. See TB flow sheet for updates.  ?

## 2021-12-28 NOTE — Telephone Encounter (Signed)
4/13 @ 3:53 pm Left voicemail for patient to call our office to be reschedule for today appointment. ?

## 2021-12-28 NOTE — Progress Notes (Signed)
The proposed treatment discussed in cancer conference is for discussion purpose only and is not a binding recommendation. The patient was not physically examined nor present for their treatment options. Therefore, final treatment plans cannot be decided.  ?

## 2021-12-28 NOTE — Progress Notes (Incomplete)
?Radiation Oncology         [phone] ?________________________________ ? ?Initial outpatient Consultation ? ?Name: [name] MRN: [id number]  ?Date of Service: 12/28/2021 DOB: 12/19/1945 ? ?CC:Hall, John Z, MD  Icard, Bradley L, DO  ? ?REFERRING PHYSICIAN: Icard, Bradley L, DO ? ?DIAGNOSIS: 76-year-old male with newly diagnosed Stage IA NSCLC, squamous cell carcinoma of the left lower lobe lung.   ? ?  ICD-10-CM   ?1. Non-small cell carcinoma of left lung, stage 1 (HCC)  C34.92   ?  ? ? ?HISTORY OF PRESENT ILLNESS: [name] is a 76 y.o. male seen at the request of Dr. Icard. He had a new, 2.2 cm left lower lobe pulmonary nodule identified on screening CT chest performed 10/20/2021.  This was further evaluated with a PET scan on 11/09/2021 which confirmed hypermetabolism in the 2 cm LLL lung lesion, without lymphadenopathy or evidence of distant metastatic disease.  He met with Dr. Icard in consult on 12/01/2021 and underwent robotic assisted bronchoscopy with biopsy and placement of fiducial marker in the LLL lung lesion on 12/19/2021.  Final surgical pathology confirmed NSCLC, squamous cell carcinoma.  He is not felt to be a surgical candidate secondary to ischemic cardiomyopathy, COPD, stage IV CKD, cirrhosis and mild dementia.  Therefore, he has kindly been referred today for discussion of radiation treatment options in the management of his disease. ? ?PREVIOUS RADIATION THERAPY: No ? ?PAST MEDICAL HISTORY:  ?Past Medical History:  ?Diagnosis Date  ? CAD (coronary artery disease)   ? IMI in 5/07 with CTO mid cx, 60% LAD, 99% RCA-> BMS; mod. impaired LV function; EF:35-40% 2/10  ? Cardiomyopathy (HCC)   ? Cirrhosis (HCC)   ? History of excessive alcohol use  ? CKD (chronic kidney disease) stage 4, GFR 15-29 ml/min (HCC)   ? Dementia (HCC)   ? Gastroesophageal reflux disease   ? Hyperlipidemia   ? Non-small cell carcinoma of left lung, stage 1 (HCC) 12/28/2021  ?   ? ?PAST SURGICAL HISTORY: ?Past  Surgical History:  ?Procedure Laterality Date  ? BIOPSY  12/05/2018  ? Procedure: BIOPSY;  Surgeon: Rehman, Najeeb U, MD;  Location: [facility];  Service: Endoscopy;;  gastric  ? BIOPSY  05/09/2021  ? Procedure: BIOPSY;  Surgeon: Castaneda Mayorga, Daniel, MD;  Location: [facility];  Service: Gastroenterology;;  ? BRONCHIAL BIOPSY  12/19/2021  ? Procedure: BRONCHIAL BIOPSIES;  Surgeon: Icard, Bradley L, DO;  Location: [facility];  Service: Pulmonary;;  ? BRONCHIAL BRUSHINGS  12/19/2021  ? Procedure: BRONCHIAL BRUSHINGS;  Surgeon: Icard, Bradley L, DO;  Location: [facility];  Service: Pulmonary;;  ? BRONCHIAL NEEDLE ASPIRATION BIOPSY  12/19/2021  ? Procedure: BRONCHIAL NEEDLE ASPIRATION BIOPSIES;  Surgeon: Icard, Bradley L, DO;  Location: [facility];  Service: Pulmonary;;  ? COLONOSCOPY WITH PROPOFOL N/A 05/09/2021  ? Procedure: COLONOSCOPY WITH PROPOFOL;  Surgeon: Castaneda Mayorga, Daniel, MD;  Location: [facility];  Service: Gastroenterology;  Laterality: N/A;  12:30  ? ESOPHAGOGASTRODUODENOSCOPY N/A 12/19/2016  ? Procedure: ESOPHAGOGASTRODUODENOSCOPY (EGD);  Surgeon: Najeeb U Rehman, MD;  Location: [facility];  Service: Endoscopy;  Laterality: N/A;  ? ESOPHAGOGASTRODUODENOSCOPY N/A 12/21/2016  ? Procedure: ESOPHAGOGASTRODUODENOSCOPY (EGD);  Surgeon: Sandi L Fields, MD;  Location: [facility];  Service: Endoscopy;  Laterality: N/A;  ? ESOPHAGOGASTRODUODENOSCOPY (EGD) WITH PROPOFOL N/A 12/22/2016  ? Procedure: ESOPHAGOGASTRODUODENOSCOPY (EGD) WITH PROPOFOL;  Surgeon: Kavitha Nandigam V, MD;  Location: [facility];  Service: Endoscopy;  Laterality: N/A;  ? ESOPHAGOGASTRODUODENOSCOPY (  EGD) WITH PROPOFOL N/A 12/05/2018  ? Procedure: ESOPHAGOGASTRODUODENOSCOPY (EGD) WITH PROPOFOL;  Surgeon: Rogene Houston, MD;  Location: [facility];  Service: Endoscopy;  Laterality: N/A;  1:35  ? ESOPHAGOGASTRODUODENOSCOPY (EGD) WITH PROPOFOL N/A 05/09/2021  ? Procedure: ESOPHAGOGASTRODUODENOSCOPY (EGD) WITH PROPOFOL;   Surgeon: Harvel Quale, MD;  Location: [facility];  Service: Gastroenterology;  Laterality: N/A;  ? FIDUCIAL MARKER PLACEMENT  12/19/2021  ? Procedure: FIDUCIAL MARKER PLACEMENT;  Surgeon: Garner Nash, DO;  Location: [facility];  Service: Pulmonary;;  ? POLYPECTOMY  05/09/2021  ? Procedure: POLYPECTOMY;  Surgeon: Harvel Quale, MD;  Location: [facility];  Service: Gastroenterology;;  ? [place]  12/19/2021  ? Procedure: RADIAL ENDOBRONCHIAL ULTRASOUND;  Surgeon: Garner Nash, DO;  Location: [facility];  Service: Pulmonary;;  ? ? ?FAMILY HISTORY:  ?Family History  ?Problem Relation Age of Onset  ? Other Brother   ? ? ?SOCIAL HISTORY:  ?Social History  ? ?Socioeconomic History  ? Marital status: Single  ?  Spouse name: Not on file  ? Number of children: Not on file  ? Years of education: Not on file  ? Highest education level: Not on file  ?Occupational History  ? Occupation: Retired  ?Tobacco Use  ? Smoking status: Every Day  ?  Packs/day: 0.50  ?  Years: 60.00  ?  Pack years: 30.00  ?  Types: Cigarettes  ? Smokeless tobacco: Never  ? Tobacco comments:  ?  12 cigarettes smoked daily [facility]J 12/26/21  ?Vaping Use  ? Vaping Use: Never used  ?Substance and Sexual Activity  ? Alcohol use: Not Currently  ?  Comment: per sister "he had one beer last week"  ? Drug use: No  ? Sexual activity: Not on file  ?Other Topics Concern  ? Not on file  ?Social History Narrative  ? Resides in supervised living situation  ? No regular exercise  ? ?Social Determinants of Health  ? ?Financial Resource Strain: Not on file  ?Food Insecurity: Not on file  ?Transportation Needs: Not on file  ?Physical Activity: Not on file  ?Stress: Not on file  ?Social Connections: Not on file  ?Intimate Partner Violence: Not on file  ? ? ?ALLERGIES: Patient has no known allergies. ? ?MEDICATIONS:  ?Current Outpatient Medications  ?Medication Sig Dispense Refill  ? allopurinol  (ZYLOPRIM) 100 MG tablet Take 100 mg by mouth daily.    ? atorvastatin (LIPITOR) 20 MG tablet Take 20 mg by mouth daily.    ? carvedilol (COREG) 6.25 MG tablet Take 1 tablet (6.25 mg total) by mouth 2 (two) times daily with a meal. 60 tablet 3  ? folic acid (FOLVITE) 1 MG tablet Take 1 tablet (1 mg total) by mouth daily. 30 tablet 3  ? furosemide (LASIX) 40 MG tablet Take 40 mg by mouth daily.    ? hydrALAZINE (APRESOLINE) 10 MG tablet Take 10 mg by mouth in the morning and at bedtime.    ? isosorbide dinitrate (ISORDIL) 10 MG tablet Take 1 tablet (10 mg total) by mouth 2 (two) times daily. 60 tablet 5  ? Magnesium Oxide 400 (240 Mg) MG TABS Take 1 tablet (400 mg total) by mouth daily. 30 tablet 3  ? Multiple Vitamins-Minerals (MULTIVITAMIN WITH MINERALS) tablet Take 1 tablet by mouth daily. 30 tablet 3  ? nitroGLYCERIN (NITROSTAT) 0.4 MG SL tablet Place 0.4 mg under the tongue every 5 (five) minutes as needed for chest pain.      ?  pantoprazole (PROTONIX) 40 MG tablet TAKE 1 TABLET BY MOUTH ONCE DAILY BEFORE BREAKFAST. 90 tablet 0  ? potassium chloride (KLOR-CON) 10 MEQ tablet Take 1 tablet (10 mEq total) by mouth daily. 30 tablet 3  ? senna-docusate (SENOKOT-S) 8.6-50 MG tablet Take 1 tablet by mouth daily.    ? spironolactone (ALDACTONE) 25 MG tablet Take 12.5 mg by mouth daily.    ? vitamin B-12 (CYANOCOBALAMIN) 100 MCG tablet Take 100 mcg by mouth daily.    ? ?No current facility-administered medications for this visit.  ? ? ?REVIEW OF SYSTEMS:  On review of systems, the patient reports that *** is doing well overall. *** denies any chest pain, shortness of breath, cough, fevers, chills, night sweats, unintended weight changes. *** denies any bowel or bladder disturbances, and denies abdominal pain, nausea or vomiting. *** denies any new musculoskeletal or joint aches or pains. A complete review of systems is obtained and is otherwise negative. ? ?  ?PHYSICAL EXAM:  ?Wt Readings from Last 3 Encounters:   ?12/26/21 122 lb 6.4 oz (55.5 kg)  ?12/19/21 121 lb 14.6 oz (55.3 kg)  ?12/01/21 122 lb (55.3 kg)  ? ?Temp Readings from Last 3 Encounters:  ?12/26/21 98 ?F (36.7 ?C) (Oral)  ?12/19/21 98 ?F (36.7 ?C)  ?12/01/21 (!) 9

## 2021-12-29 ENCOUNTER — Telehealth: Payer: Self-pay | Admitting: Radiation Oncology

## 2021-12-29 NOTE — Telephone Encounter (Signed)
4/14 @ 8:22 am Left voicemail for patient to call our office to be r/s for 4/13 appt.  Also call pt's sister Ruby no answer could not leave voicemail.   ?

## 2022-01-04 DIAGNOSIS — Z720 Tobacco use: Secondary | ICD-10-CM | POA: Diagnosis not present

## 2022-01-04 DIAGNOSIS — I5022 Chronic systolic (congestive) heart failure: Secondary | ICD-10-CM | POA: Diagnosis not present

## 2022-01-04 DIAGNOSIS — D696 Thrombocytopenia, unspecified: Secondary | ICD-10-CM | POA: Diagnosis not present

## 2022-01-04 DIAGNOSIS — M109 Gout, unspecified: Secondary | ICD-10-CM | POA: Diagnosis not present

## 2022-01-04 DIAGNOSIS — N184 Chronic kidney disease, stage 4 (severe): Secondary | ICD-10-CM | POA: Diagnosis not present

## 2022-01-04 DIAGNOSIS — E782 Mixed hyperlipidemia: Secondary | ICD-10-CM | POA: Diagnosis not present

## 2022-01-04 DIAGNOSIS — I10 Essential (primary) hypertension: Secondary | ICD-10-CM | POA: Diagnosis not present

## 2022-01-04 DIAGNOSIS — Z0000 Encounter for general adult medical examination without abnormal findings: Secondary | ICD-10-CM | POA: Diagnosis not present

## 2022-01-04 DIAGNOSIS — K219 Gastro-esophageal reflux disease without esophagitis: Secondary | ICD-10-CM | POA: Diagnosis not present

## 2022-01-04 DIAGNOSIS — R809 Proteinuria, unspecified: Secondary | ICD-10-CM | POA: Diagnosis not present

## 2022-01-04 DIAGNOSIS — R771 Abnormality of globulin: Secondary | ICD-10-CM | POA: Diagnosis not present

## 2022-01-04 DIAGNOSIS — D631 Anemia in chronic kidney disease: Secondary | ICD-10-CM | POA: Diagnosis not present

## 2022-01-09 ENCOUNTER — Ambulatory Visit
Admission: RE | Admit: 2022-01-09 | Discharge: 2022-01-09 | Disposition: A | Discharge status: Home / Self Care | Payer: Medicare Other | Source: Ambulatory Visit | Attending: Radiation Oncology | Admitting: Radiation Oncology

## 2022-01-09 ENCOUNTER — Other Ambulatory Visit: Payer: Self-pay

## 2022-01-09 VITALS — Ht 67.0 in | Wt 124.0 lb

## 2022-01-09 DIAGNOSIS — C3492 Malignant neoplasm of unspecified part of left bronchus or lung: Secondary | ICD-10-CM

## 2022-01-09 DIAGNOSIS — F17210 Nicotine dependence, cigarettes, uncomplicated: Secondary | ICD-10-CM | POA: Diagnosis not present

## 2022-01-09 DIAGNOSIS — C3432 Malignant neoplasm of lower lobe, left bronchus or lung: Secondary | ICD-10-CM | POA: Diagnosis not present

## 2022-01-09 NOTE — Addendum Note (Signed)
Encounter addended by: Tyler Pita, MD on: 01/09/2022 9:49 AM ? Actions taken: Medication List reviewed, Problem List reviewed, Allergies reviewed, External Videoconference Connected, External Videoconference Disconnected

## 2022-01-09 NOTE — Addendum Note (Signed)
Encounter addended by: Freeman Caldron, PA-C on: 01/09/2022 9:37 AM ? Actions taken: External Videoconference Connected

## 2022-01-09 NOTE — Progress Notes (Signed)
Thoracic Location of Tumor / Histology: Left Lower Lobe Lung Ca ? ?Biopsies: ?12/19/2021 ?Dr. Valeta Harms ? ?Target #1 LLL: ?The distinct navigation pathways prepared prior to this procedure were then utilized to navigate to patient's lesion identified on CT scan. The robotic catheter was secured into place and the vision probe was withdrawn.  Lesion location was approximated using fluoroscopy and radial endobronchial ultrasound for peripheral targeting. Under fluoroscopic guidance transbronchial needle brushings, transbronchial needle biopsies, and transbronchial forceps biopsies were performed to be sent for cytology and pathology. Following tissue biopsy a single fiducial was placed under fluoroscopic guidance.  ?  ?Tobacco/Marijuana/Snuff/ETOH use: Yes, tobacco smoker, no smokeless or drug use. Not currently use of alcohol. ? ?Past/Anticipated interventions by cardiothoracic surgery, if any: Biopsy Lung (12/19/2021) ? ?Past/Anticipated interventions by medical oncology, if any: NA ? ?Signs/Symptoms ?Weight changes, if any: No ?Respiratory complaints, if any: No ?Hemoptysis, if any: No ?Pain issues, if any:  No  "In my wallet" ? ?SAFETY ISSUES: ?Prior radiation?  No ?Pacemaker/ICD?  No ?Possible current pregnancy?  Male ?Is the patient on methotrexate?  No ? ?Current Complaints / other details:   Saw dentist on 01/08/2022 tooth infection has order for antibiotics hasn't started per sister because she wants to talk with his PCP first. ?

## 2022-01-09 NOTE — Addendum Note (Signed)
Encounter addended by: Freeman Caldron, PA-C on: 01/09/2022 9:57 AM ? Actions taken: Level of Service modified

## 2022-01-09 NOTE — Progress Notes (Signed)
?Radiation Oncology         [phone] ?________________________________ ? ?Initial outpatient Consultation ? ?Name: [name] MRN: [id number]  ?Date of Service: 01/09/2022 DOB: 1946-08-19 ? ?XO:VANV, [name], MD  Garner Nash, DO  ? ?REFERRING PHYSICIAN: Garner Nash, DO ? ?DIAGNOSIS: 76 year old male with newly diagnosed Stage IA NSCLC, squamous cell carcinoma of the left lower lobe lung.   ? ?  ICD-10-CM   ?1. Non-small cell carcinoma of left lung, stage 1 (HCC)  C34.92   ?  ? ? ?HISTORY OF PRESENT ILLNESS: [name] is a 76 y.o. male seen at the request of Dr. Valeta Harms. He had a new, 2.2 cm left lower lobe pulmonary nodule identified on screening CT chest performed 10/20/2021.  This was further evaluated with a PET scan on 11/09/2021 which confirmed hypermetabolism in the 2 cm LLL lung lesion, without lymphadenopathy or evidence of distant metastatic disease.  He met with Dr. Valeta Harms in consult on 12/01/2021 and underwent robotic assisted bronchoscopy with biopsy and placement of fiducial marker in the LLL lung lesion on 12/19/2021.  Final surgical pathology confirmed NSCLC, squamous cell carcinoma.  He is not felt to be a surgical candidate secondary to ischemic cardiomyopathy, COPD, stage IV CKD, cirrhosis and mild dementia.  Therefore, he has kindly been referred today for discussion of radiation treatment options in the management of his disease. ? ?PREVIOUS RADIATION THERAPY: No ? ?PAST MEDICAL HISTORY:  ?Past Medical History:  ?Diagnosis Date  ? CAD (coronary artery disease)   ? IMI in 5/07 with CTO mid cx, 60% LAD, 99% RCA-> BMS; mod. impaired LV function; EF:35-40% 2/10  ? Cardiomyopathy ([place])   ? Cirrhosis ([place])   ? History of excessive alcohol use  ? CKD (chronic kidney disease) stage 4, GFR 15-29 ml/min (HCC)   ? Dementia ([place])   ? Gastroesophageal reflux disease   ? Hyperlipidemia   ? Non-small cell carcinoma of left lung, stage 1 ([place]) 12/28/2021  ?   ? ?PAST SURGICAL HISTORY: ?Past  Surgical History:  ?Procedure Laterality Date  ? BIOPSY  12/05/2018  ? Procedure: BIOPSY;  Surgeon: Rogene Houston, MD;  [facility];  Service: Endoscopy;;  gastric  ? BIOPSY  05/09/2021  ? Procedure: BIOPSY;  Surgeon: Harvel Quale, MD;  Location: [facility];  Service: Gastroenterology;;  ? BRONCHIAL BIOPSY  12/19/2021  ? Procedure: BRONCHIAL BIOPSIES;  Surgeon: Garner Nash, DO;  Location: [facility];  Service: Pulmonary;;  ? BRONCHIAL BRUSHINGS  12/19/2021  ? Procedure: BRONCHIAL BRUSHINGS;  Surgeon: Garner Nash, DO;  Location: [place] [facility] [facility];  Service: Pulmonary;;  ? BRONCHIAL NEEDLE ASPIRATION BIOPSY  12/19/2021  ? Procedure: BRONCHIAL NEEDLE ASPIRATION BIOPSIES;  Surgeon: Garner Nash, DO;  Location: [facility];  Service: Pulmonary;;  ? COLONOSCOPY WITH PROPOFOL N/A 05/09/2021  ? Procedure: COLONOSCOPY WITH PROPOFOL;  Surgeon: Harvel Quale, MD;  [facility];  Service: Gastroenterology;  Laterality: N/A;  12:30  ? ESOPHAGOGASTRODUODENOSCOPY N/A 12/19/2016  ? Procedure: ESOPHAGOGASTRODUODENOSCOPY (EGD);  Surgeon: Rogene Houston, MD;  [facility];  Service: Endoscopy;  Laterality: N/A;  ? ESOPHAGOGASTRODUODENOSCOPY N/A 12/21/2016  ? Procedure: ESOPHAGOGASTRODUODENOSCOPY (EGD);  Surgeon: Danie Binder, MD;  [facility];  Service: Endoscopy;  Laterality: N/A;  ? ESOPHAGOGASTRODUODENOSCOPY (EGD) WITH PROPOFOL N/A 12/22/2016  ? Procedure: ESOPHAGOGASTRODUODENOSCOPY (EGD) WITH PROPOFOL;  Surgeon: Mauri Pole, MD;  Location: [facility];  Service: Endoscopy;  Laterality: N/A;  ? ESOPHAGOGASTRODUODENOSCOPY (  EGD) WITH PROPOFOL N/A 12/05/2018  ? Procedure: ESOPHAGOGASTRODUODENOSCOPY (EGD) WITH PROPOFOL;  Surgeon: Rehman, Najeeb U, MD;  [facility];  Service: Endoscopy;  Laterality: N/A;  1:35  ? ESOPHAGOGASTRODUODENOSCOPY (EGD) WITH PROPOFOL N/A 05/09/2021  ? Procedure: ESOPHAGOGASTRODUODENOSCOPY (EGD) WITH PROPOFOL;   Surgeon: Castaneda Mayorga, Daniel, MD;  [facility];  Service: Gastroenterology;  Laterality: N/A;  ? FIDUCIAL MARKER PLACEMENT  12/19/2021  ? Procedure: FIDUCIAL MARKER PLACEMENT;  Surgeon: Icard, Bradley L, DO;  Location: [facility];  Service: Pulmonary;;  ? POLYPECTOMY  05/09/2021  ? Procedure: POLYPECTOMY;  Surgeon: Castaneda Mayorga, Daniel, MD;  [facility];  Service: Gastroenterology;;  ? VIDEO BRONCHOSCOPY WITH RADIAL ENDOBRONCHIAL ULTRASOUND  12/19/2021  ? Procedure: RADIAL ENDOBRONCHIAL ULTRASOUND;  Surgeon: Icard, Bradley L, DO;  Location: [facility];  Service: Pulmonary;;  ? ? ?FAMILY HISTORY:  ?Family History  ?Problem Relation Age of Onset  ? Other Brother   ? ? ?SOCIAL HISTORY:  ?Social History  ? ?Socioeconomic History  ? Marital status: Single  ?  Spouse name: Not on file  ? Number of children: Not on file  ? Years of education: Not on file  ? Highest education level: Not on file  ?Occupational History  ? Occupation: Retired  ?Tobacco Use  ? Smoking status: Every Day  ?  Packs/day: 0.50  ?  Years: 60.00  ?  Pack years: 30.00  ?  Types: Cigarettes  ? Smokeless tobacco: Never  ? Tobacco comments:  ?  12 cigarettes smoked daily ARJ 12/26/21  ?Vaping Use  ? Vaping Use: Never used  ?Substance and Sexual Activity  ? Alcohol use: Not Currently  ?  Comment: per sister "he had one beer last week"  ? Drug use: No  ? Sexual activity: Not on file  ?Other Topics Concern  ? Not on file  ?Social History Narrative  ? Resides in supervised living situation  ? No regular exercise  ? ?Social Determinants of Health  ? ?Financial Resource Strain: Not on file  ?Food Insecurity: Not on file  ?Transportation Needs: Not on file  ?Physical Activity: Not on file  ?Stress: Not on file  ?Social Connections: Not on file  ?Intimate Partner Violence: Not on file  ? ? ?ALLERGIES: Patient has no known allergies. ? ?MEDICATIONS:  ?Current Outpatient Medications  ?Medication Sig Dispense Refill  ? allopurinol  (ZYLOPRIM) 100 MG tablet Take 100 mg by mouth daily.    ? atorvastatin (LIPITOR) 20 MG tablet Take 20 mg by mouth daily.    ? carvedilol (COREG) 6.25 MG tablet Take 1 tablet (6.25 mg total) by mouth 2 (two) times daily with a meal. 60 tablet 3  ? folic acid (FOLVITE) 1 MG tablet Take 1 tablet (1 mg total) by mouth daily. 30 tablet 3  ? furosemide (LASIX) 40 MG tablet Take 40 mg by mouth daily.    ? hydrALAZINE (APRESOLINE) 10 MG tablet Take 10 mg by mouth in the morning and at bedtime.    ? isosorbide dinitrate (ISORDIL) 10 MG tablet Take 1 tablet (10 mg total) by mouth 2 (two) times daily. 60 tablet 5  ? Magnesium Oxide 400 (240 Mg) MG TABS Take 1 tablet (400 mg total) by mouth daily. 30 tablet 3  ? Multiple Vitamins-Minerals (MULTIVITAMIN WITH MINERALS) tablet Take 1 tablet by mouth daily. 30 tablet 3  ? nitroGLYCERIN (NITROSTAT) 0.4 MG SL tablet Place 0.4 mg under the tongue every 5 (five) minutes as needed for chest pain.      ?   pantoprazole (PROTONIX) 40 MG tablet TAKE 1 TABLET BY MOUTH ONCE DAILY BEFORE BREAKFAST. 90 tablet 0  ? potassium chloride (KLOR-CON) 10 MEQ tablet Take 1 tablet (10 mEq total) by mouth daily. 30 tablet 3  ? senna-docusate (SENOKOT-S) 8.6-50 MG tablet Take 1 tablet by mouth daily.    ? spironolactone (ALDACTONE) 25 MG tablet Take 12.5 mg by mouth daily.    ? vitamin B-12 (CYANOCOBALAMIN) 100 MCG tablet Take 100 mcg by mouth daily.    ? megestrol (MEGACE) 40 MG tablet megestrol 40 mg tablet ? TAKE 1/2 TABLET ($RemoveBef'20MG'[id number]$ ) BY MOUTH TWICE A DAY.    ? ?No current facility-administered medications for this encounter.  ? ? ?REVIEW OF SYSTEMS:  On review of systems, the patient reports that he is doing well overall. He denies any chest pain, shortness of breath, cough, fevers, chills, night sweats, unintended weight changes. He denies any bowel or bladder disturbances, and denies abdominal pain, nausea or vomiting. He denies any new musculoskeletal or joint aches or pains. A complete review of systems  is obtained and is otherwise negative. ? ?  ?PHYSICAL EXAM:  ?Wt Readings from Last 3 Encounters:  ?01/09/22 124 lb (56.2 kg)  ?12/26/21 122 lb 6.4 oz (55.5 kg)  ?12/19/21 121 lb 14.6 oz (55.3 kg)  ? ?Temp Ulyess Mort

## 2022-01-09 NOTE — Addendum Note (Signed)
Encounter addended by: Tyler Pita, MD on: 01/09/2022 5:33 PM ? Actions taken: Medication List reviewed, Problem List reviewed, Allergies reviewed, Clinical Note Signed

## 2022-01-09 NOTE — Addendum Note (Signed)
Encounter addended by: Freeman Caldron, PA-C on: 01/09/2022 1:14 PM ? Actions taken: Pend clinical note

## 2022-01-12 ENCOUNTER — Ambulatory Visit
Admission: RE | Admit: 2022-01-12 | Discharge: 2022-01-12 | Disposition: A | Discharge status: Home / Self Care | Payer: Medicare Other | Source: Ambulatory Visit | Attending: Radiation Oncology | Admitting: Radiation Oncology

## 2022-01-12 DIAGNOSIS — C3432 Malignant neoplasm of lower lobe, left bronchus or lung: Secondary | ICD-10-CM | POA: Insufficient documentation

## 2022-01-12 DIAGNOSIS — Z510 Encounter for antineoplastic radiation therapy: Secondary | ICD-10-CM | POA: Diagnosis not present

## 2022-01-12 DIAGNOSIS — F17210 Nicotine dependence, cigarettes, uncomplicated: Secondary | ICD-10-CM | POA: Diagnosis not present

## 2022-01-12 DIAGNOSIS — C3492 Malignant neoplasm of unspecified part of left bronchus or lung: Secondary | ICD-10-CM

## 2022-01-13 NOTE — Progress Notes (Signed)
?  Radiation Oncology         [phone] ?________________________________ ? ?Name: [name] MRN: [id number]  ?Date: 01/12/2022  DOB: Mar 23, 1946 ? ?STEREOTACTIC BODY RADIOTHERAPY ?SIMULATION AND TREATMENT PLANNING NOTE ? ?  ICD-10-CM   ?1. Non-small cell carcinoma of left lung, stage 1 (HCC)  C34.92   ?  ? ? ?DIAGNOSIS:  76 year old male with newly diagnosed Stage IA NSCLC, squamous cell carcinoma of the left lower lobe lung.  ? ?NARRATIVE:  The patient was brought to the [facility].  Identity was confirmed.  All relevant records and images related to the planned course of therapy were reviewed.  The patient freely provided informed written consent to proceed with treatment after reviewing the details related to the planned course of therapy. The consent form was witnessed and verified by the simulation staff.  Then, the patient was set-up in a stable reproducible  supine position for radiation therapy.  A BodyFix immobilization pillow was fabricated for reproducible positioning.  Then I personally applied the abdominal compression paddle to limit respiratory excursion.  4D respiratoy motion management CT images were obtained.  Surface markings were placed.  The CT images were loaded into the planning software.  Then, using Cine, MIP, and standard views, the internal target volume (ITV) and planning target volumes (PTV) were delinieated, and avoidance structures were contoured.  Treatment planning then occurred.  The radiation prescription was entered and confirmed.  A total of two complex treatment devices were fabricated in the form of the BodyFix immobilization pillow and a neck accuform cushion.  I have requested : 3D Simulation  I have requested a DVH of the following structures: Heart, Lungs, Esophagus, Chest Wall, Brachial Plexus, Major Blood Vessels, and targets. ? ?SPECIAL TREATMENT PROCEDURE:  The planned course of therapy using radiation constitutes a special treatment  procedure. Special care is required in the management of this patient for the following reasons. This treatment constitutes a Special Treatment Procedure for the following reason: [ High dose per fraction requiring special monitoring for increased toxicities of treatment including daily imaging..  The special nature of the planned course of radiotherapy will require increased physician supervision and oversight to ensure patient's safety with optimal treatment outcomes.  This requires extended time and effort.   ? ?RESPIRATORY MOTION MANAGEMENT SIMULATION:  In order to account for effect of respiratory motion on target structures and other organs in the planning and delivery of radiotherapy, this patient underwent respiratory motion management simulation.  To accomplish this, when the patient was brought to the CT simulation planning suite, 4D respiratoy motion management CT images were obtained.  The CT images were loaded into the planning software.  Then, using a variety of tools including Cine, MIP, and standard views, the target volume and planning target volumes (PTV) were delineated.  Avoidance structures were contoured.  Treatment planning then occurred.  Dose volume histograms were generated and reviewed for each of the requested structure.  The resulting plan was carefully reviewed and approved today. ? ?PLAN:  The patient will receive 54 Gy in 3 fractions. ? ?________________________________ ? ?Sheral Apley Tammi Klippel, M.D. ? ?

## 2022-01-18 DIAGNOSIS — Z510 Encounter for antineoplastic radiation therapy: Secondary | ICD-10-CM | POA: Diagnosis not present

## 2022-01-18 DIAGNOSIS — C3432 Malignant neoplasm of lower lobe, left bronchus or lung: Secondary | ICD-10-CM | POA: Diagnosis not present

## 2022-01-18 DIAGNOSIS — F17210 Nicotine dependence, cigarettes, uncomplicated: Secondary | ICD-10-CM | POA: Diagnosis not present

## 2022-01-18 DIAGNOSIS — C3492 Malignant neoplasm of unspecified part of left bronchus or lung: Secondary | ICD-10-CM | POA: Insufficient documentation

## 2022-01-23 ENCOUNTER — Other Ambulatory Visit: Payer: Self-pay

## 2022-01-23 ENCOUNTER — Ambulatory Visit
Admission: RE | Admit: 2022-01-23 | Discharge: 2022-01-23 | Disposition: A | Discharge status: Home / Self Care | Payer: Medicare Other | Source: Ambulatory Visit | Attending: Radiation Oncology | Admitting: Radiation Oncology

## 2022-01-23 DIAGNOSIS — C3492 Malignant neoplasm of unspecified part of left bronchus or lung: Secondary | ICD-10-CM

## 2022-01-23 DIAGNOSIS — Z510 Encounter for antineoplastic radiation therapy: Secondary | ICD-10-CM | POA: Diagnosis not present

## 2022-01-23 LAB — RAD ONC ARIA SESSION SUMMARY
Course Elapsed Days: 0
Plan Fractions Treated to Date: 1
Plan Prescribed Dose Per Fraction: 18 Gy
Plan Total Fractions Prescribed: 3
Plan Total Prescribed Dose: 54 Gy
Reference Point Dosage Given to Date: 18 Gy
Reference Point Session Dosage Given: 18 Gy
Session Number: 1

## 2022-01-25 ENCOUNTER — Other Ambulatory Visit: Payer: Self-pay

## 2022-01-25 ENCOUNTER — Ambulatory Visit
Admission: RE | Admit: 2022-01-25 | Discharge: 2022-01-25 | Disposition: A | Discharge status: Home / Self Care | Payer: Medicare Other | Source: Ambulatory Visit | Attending: Radiation Oncology | Admitting: Radiation Oncology

## 2022-01-25 DIAGNOSIS — C3492 Malignant neoplasm of unspecified part of left bronchus or lung: Secondary | ICD-10-CM | POA: Diagnosis not present

## 2022-01-25 DIAGNOSIS — Z510 Encounter for antineoplastic radiation therapy: Secondary | ICD-10-CM | POA: Diagnosis not present

## 2022-01-25 LAB — RAD ONC ARIA SESSION SUMMARY
Course Elapsed Days: 2
Plan Fractions Treated to Date: 2
Plan Prescribed Dose Per Fraction: 18 Gy
Plan Total Fractions Prescribed: 3
Plan Total Prescribed Dose: 54 Gy
Reference Point Dosage Given to Date: 36 Gy
Reference Point Session Dosage Given: 18 Gy
Session Number: 2

## 2022-01-26 DIAGNOSIS — K029 Dental caries, unspecified: Secondary | ICD-10-CM | POA: Diagnosis not present

## 2022-01-30 ENCOUNTER — Ambulatory Visit
Admission: RE | Admit: 2022-01-30 | Discharge: 2022-01-30 | Disposition: A | Discharge status: Home / Self Care | Payer: Medicare Other | Source: Ambulatory Visit | Attending: Radiation Oncology | Admitting: Radiation Oncology

## 2022-01-30 ENCOUNTER — Encounter: Payer: Self-pay | Admitting: Radiation Oncology

## 2022-01-30 ENCOUNTER — Other Ambulatory Visit: Payer: Self-pay

## 2022-01-30 DIAGNOSIS — C3492 Malignant neoplasm of unspecified part of left bronchus or lung: Secondary | ICD-10-CM

## 2022-01-30 DIAGNOSIS — F17210 Nicotine dependence, cigarettes, uncomplicated: Secondary | ICD-10-CM | POA: Diagnosis not present

## 2022-01-30 DIAGNOSIS — Z510 Encounter for antineoplastic radiation therapy: Secondary | ICD-10-CM | POA: Diagnosis not present

## 2022-01-30 DIAGNOSIS — C3432 Malignant neoplasm of lower lobe, left bronchus or lung: Secondary | ICD-10-CM | POA: Diagnosis not present

## 2022-01-30 LAB — RAD ONC ARIA SESSION SUMMARY
Course Elapsed Days: 7
Plan Fractions Treated to Date: 3
Plan Prescribed Dose Per Fraction: 18 Gy
Plan Total Fractions Prescribed: 3
Plan Total Prescribed Dose: 54 Gy
Reference Point Dosage Given to Date: 54 Gy
Reference Point Session Dosage Given: 18 Gy
Session Number: 3

## 2022-02-13 DIAGNOSIS — I5022 Chronic systolic (congestive) heart failure: Secondary | ICD-10-CM | POA: Diagnosis not present

## 2022-02-13 DIAGNOSIS — R55 Syncope and collapse: Secondary | ICD-10-CM | POA: Diagnosis not present

## 2022-02-13 DIAGNOSIS — N184 Chronic kidney disease, stage 4 (severe): Secondary | ICD-10-CM | POA: Diagnosis not present

## 2022-02-13 DIAGNOSIS — Z720 Tobacco use: Secondary | ICD-10-CM | POA: Diagnosis not present

## 2022-02-13 DIAGNOSIS — C3490 Malignant neoplasm of unspecified part of unspecified bronchus or lung: Secondary | ICD-10-CM | POA: Diagnosis not present

## 2022-02-13 DIAGNOSIS — I10 Essential (primary) hypertension: Secondary | ICD-10-CM | POA: Diagnosis not present

## 2022-02-13 DIAGNOSIS — D631 Anemia in chronic kidney disease: Secondary | ICD-10-CM | POA: Diagnosis not present

## 2022-02-13 DIAGNOSIS — E43 Unspecified severe protein-calorie malnutrition: Secondary | ICD-10-CM | POA: Diagnosis not present

## 2022-02-14 ENCOUNTER — Inpatient Hospital Stay (HOSPITAL_COMMUNITY)
Admission: EM | Admit: 2022-02-14 | Discharge: 2022-02-18 | DRG: 683 | Disposition: A | Discharge status: Home / Self Care | Payer: Medicare Other | Attending: Internal Medicine | Admitting: Internal Medicine

## 2022-02-14 ENCOUNTER — Other Ambulatory Visit: Payer: Self-pay

## 2022-02-14 ENCOUNTER — Encounter (HOSPITAL_COMMUNITY): Payer: Self-pay | Admitting: *Deleted

## 2022-02-14 ENCOUNTER — Emergency Department (HOSPITAL_COMMUNITY): Payer: Medicare Other

## 2022-02-14 DIAGNOSIS — N390 Urinary tract infection, site not specified: Secondary | ICD-10-CM | POA: Diagnosis not present

## 2022-02-14 DIAGNOSIS — I950 Idiopathic hypotension: Secondary | ICD-10-CM | POA: Diagnosis not present

## 2022-02-14 DIAGNOSIS — Z902 Acquired absence of lung [part of]: Secondary | ICD-10-CM | POA: Diagnosis not present

## 2022-02-14 DIAGNOSIS — D631 Anemia in chronic kidney disease: Secondary | ICD-10-CM | POA: Diagnosis not present

## 2022-02-14 DIAGNOSIS — C3492 Malignant neoplasm of unspecified part of left bronchus or lung: Secondary | ICD-10-CM | POA: Diagnosis present

## 2022-02-14 DIAGNOSIS — D649 Anemia, unspecified: Secondary | ICD-10-CM | POA: Diagnosis present

## 2022-02-14 DIAGNOSIS — F17210 Nicotine dependence, cigarettes, uncomplicated: Secondary | ICD-10-CM | POA: Diagnosis present

## 2022-02-14 DIAGNOSIS — R911 Solitary pulmonary nodule: Secondary | ICD-10-CM | POA: Diagnosis present

## 2022-02-14 DIAGNOSIS — R531 Weakness: Secondary | ICD-10-CM | POA: Diagnosis not present

## 2022-02-14 DIAGNOSIS — K7460 Unspecified cirrhosis of liver: Secondary | ICD-10-CM | POA: Diagnosis not present

## 2022-02-14 DIAGNOSIS — E860 Dehydration: Secondary | ICD-10-CM | POA: Diagnosis present

## 2022-02-14 DIAGNOSIS — N1832 Chronic kidney disease, stage 3b: Secondary | ICD-10-CM | POA: Diagnosis present

## 2022-02-14 DIAGNOSIS — R4189 Other symptoms and signs involving cognitive functions and awareness: Secondary | ICD-10-CM | POA: Diagnosis present

## 2022-02-14 DIAGNOSIS — N189 Chronic kidney disease, unspecified: Secondary | ICD-10-CM | POA: Diagnosis not present

## 2022-02-14 DIAGNOSIS — G459 Transient cerebral ischemic attack, unspecified: Secondary | ICD-10-CM | POA: Diagnosis not present

## 2022-02-14 DIAGNOSIS — E871 Hypo-osmolality and hyponatremia: Secondary | ICD-10-CM | POA: Diagnosis not present

## 2022-02-14 DIAGNOSIS — Z8711 Personal history of peptic ulcer disease: Secondary | ICD-10-CM

## 2022-02-14 DIAGNOSIS — Z66 Do not resuscitate: Secondary | ICD-10-CM | POA: Diagnosis not present

## 2022-02-14 DIAGNOSIS — Z79899 Other long term (current) drug therapy: Secondary | ICD-10-CM

## 2022-02-14 DIAGNOSIS — N179 Acute kidney failure, unspecified: Principal | ICD-10-CM | POA: Diagnosis present

## 2022-02-14 DIAGNOSIS — I130 Hypertensive heart and chronic kidney disease with heart failure and stage 1 through stage 4 chronic kidney disease, or unspecified chronic kidney disease: Secondary | ICD-10-CM | POA: Diagnosis present

## 2022-02-14 DIAGNOSIS — I255 Ischemic cardiomyopathy: Secondary | ICD-10-CM | POA: Diagnosis not present

## 2022-02-14 DIAGNOSIS — I5022 Chronic systolic (congestive) heart failure: Secondary | ICD-10-CM | POA: Diagnosis not present

## 2022-02-14 DIAGNOSIS — Z79818 Long term (current) use of other agents affecting estrogen receptors and estrogen levels: Secondary | ICD-10-CM

## 2022-02-14 DIAGNOSIS — J8410 Pulmonary fibrosis, unspecified: Secondary | ICD-10-CM | POA: Diagnosis not present

## 2022-02-14 DIAGNOSIS — I517 Cardiomegaly: Secondary | ICD-10-CM | POA: Diagnosis not present

## 2022-02-14 DIAGNOSIS — F0390 Unspecified dementia without behavioral disturbance: Secondary | ICD-10-CM | POA: Diagnosis present

## 2022-02-14 DIAGNOSIS — I959 Hypotension, unspecified: Secondary | ICD-10-CM | POA: Diagnosis present

## 2022-02-14 DIAGNOSIS — K7030 Alcoholic cirrhosis of liver without ascites: Secondary | ICD-10-CM

## 2022-02-14 LAB — COMPREHENSIVE METABOLIC PANEL
ALT: 20 U/L (ref 0–44)
AST: 24 U/L (ref 15–41)
Albumin: 2.9 g/dL — ABNORMAL LOW (ref 3.5–5.0)
Alkaline Phosphatase: 51 U/L (ref 38–126)
Anion gap: 6 (ref 5–15)
BUN: 30 mg/dL — ABNORMAL HIGH (ref 8–23)
CO2: 20 mmol/L — ABNORMAL LOW (ref 22–32)
Calcium: 8.6 mg/dL — ABNORMAL LOW (ref 8.9–10.3)
Chloride: 105 mmol/L (ref 98–111)
Creatinine, Ser: 3.08 mg/dL — ABNORMAL HIGH (ref 0.61–1.24)
GFR, Estimated: 20 mL/min — ABNORMAL LOW (ref >60)
Glucose, Bld: 95 mg/dL (ref 70–99)
Potassium: 4.5 mmol/L (ref 3.5–5.1)
Sodium: 131 mmol/L — ABNORMAL LOW (ref 135–145)
Total Bilirubin: 0.5 mg/dL (ref 0.3–1.2)
Total Protein: 8.8 g/dL — ABNORMAL HIGH (ref 6.5–8.1)

## 2022-02-14 LAB — URINALYSIS, ROUTINE W REFLEX MICROSCOPIC
Bilirubin Urine: NEGATIVE
Glucose, UA: NEGATIVE mg/dL
Hgb urine dipstick: NEGATIVE
Ketones, ur: NEGATIVE mg/dL
Nitrite: NEGATIVE
Protein, ur: NEGATIVE mg/dL
Specific Gravity, Urine: 1.006 (ref 1.005–1.030)
WBC, UA: >50 WBC/hpf — ABNORMAL HIGH (ref 0–5)
pH: 5 (ref 5.0–8.0)

## 2022-02-14 LAB — CBC WITH DIFFERENTIAL/PLATELET
Abs Immature Granulocytes: 0.01 10*3/uL (ref 0.00–0.07)
Basophils Absolute: 0 10*3/uL (ref 0.0–0.1)
Basophils Relative: 1 %
Eosinophils Absolute: 0.2 10*3/uL (ref 0.0–0.5)
Eosinophils Relative: 5 %
HCT: 25.3 % — ABNORMAL LOW (ref 39.0–52.0)
Hemoglobin: 9.1 g/dL — ABNORMAL LOW (ref 13.0–17.0)
Immature Granulocytes: 0 %
Lymphocytes Relative: 45 %
Lymphs Abs: 1.6 10*3/uL (ref 0.7–4.0)
MCH: 34.6 pg — ABNORMAL HIGH (ref 26.0–34.0)
MCHC: 36 g/dL (ref 30.0–36.0)
MCV: 96.2 fL (ref 80.0–100.0)
Monocytes Absolute: 0.2 10*3/uL (ref 0.1–1.0)
Monocytes Relative: 7 %
Neutro Abs: 1.5 10*3/uL — ABNORMAL LOW (ref 1.7–7.7)
Neutrophils Relative %: 42 %
Platelets: 114 10*3/uL — ABNORMAL LOW (ref 150–400)
RBC: 2.63 MIL/uL — ABNORMAL LOW (ref 4.22–5.81)
RDW: 14.5 % (ref 11.5–15.5)
WBC: 3.5 10*3/uL — ABNORMAL LOW (ref 4.0–10.5)
nRBC: 0 % (ref 0.0–0.2)

## 2022-02-14 LAB — LIPASE, BLOOD: Lipase: 52 U/L — ABNORMAL HIGH (ref 11–51)

## 2022-02-14 LAB — GLUCOSE, CAPILLARY: Glucose-Capillary: 100 mg/dL — ABNORMAL HIGH (ref 70–99)

## 2022-02-14 LAB — MAGNESIUM: Magnesium: 2.4 mg/dL (ref 1.7–2.4)

## 2022-02-14 MED ORDER — SODIUM CHLORIDE 0.9 % IV BOLUS
1000.0000 mL | Freq: Once | INTRAVENOUS | Status: AC
  Administered 2022-02-14: 1000 mL via INTRAVENOUS

## 2022-02-14 MED ORDER — SODIUM CHLORIDE 0.9 % IV SOLN
1.0000 g | INTRAVENOUS | Status: DC
  Administered 2022-02-14 – 2022-02-15 (×2): 1 g via INTRAVENOUS
  Filled 2022-02-14 (×2): qty 10

## 2022-02-14 MED ORDER — ENOXAPARIN SODIUM 30 MG/0.3ML IJ SOSY
30.0000 mg | PREFILLED_SYRINGE | INTRAMUSCULAR | Status: DC
  Administered 2022-02-14 – 2022-02-17 (×3): 30 mg via SUBCUTANEOUS
  Filled 2022-02-14 (×3): qty 0.3

## 2022-02-14 MED ORDER — ACETAMINOPHEN 650 MG RE SUPP: 650.0000 mg | Freq: Four times a day (QID) | RECTAL | Status: DC | PRN

## 2022-02-14 MED ORDER — POLYETHYLENE GLYCOL 3350 17 G PO PACK: 17.0000 g | PACK | Freq: Every day | ORAL | Status: DC | PRN

## 2022-02-14 MED ORDER — SODIUM CHLORIDE 0.9 % IV SOLN
INTRAVENOUS | Status: DC
Start: 2022-02-14 — End: 2022-02-15

## 2022-02-14 MED ORDER — ACETAMINOPHEN 325 MG PO TABS: 650.0000 mg | ORAL_TABLET | Freq: Four times a day (QID) | ORAL | Status: DC | PRN

## 2022-02-14 NOTE — H&P (Addendum)
History and Physical    [name]:[id number] DOB: [birth year] DOA: 02/14/2022  PCP: Celene Squibb, MD   Patient coming from: Home  I have personally briefly reviewed patient's old medical records in [place]  Chief Complaint:   HPI: [name] is a 76 y.o. male with medical history significant for lung cancer, liver cirrhosis, tobacco abuse, systolic CHF, dementia. Note that he is not eating or drinking for almost 1 to 2 weeks.  Patient has baseline dementia, he is able to answer simple questions appropriately but he does not give any further details.  Patient's Sister Aneudy Champlain is at bedside.  She reports patient has not had any appetite.  Patient denies any vomiting no loose stools, no abdominal pain, no fevers, he has no complaints. Patient's sister brought him to the hospital as she is concerned patient is dehydrated.  When patient stands up he gets dizzy. At baseline patient ambulates with a cane, is able to recognize family, answers simple questions, but has some memory problems, and does not know time-the month or year.  ED Course: T97.7.  Heart rate 84-110.  Respiratory rate 17-21.  Blood pressure dropped to 89/50, currently 650P systolic.  UA shows large leukocytes.  Creatinine elevated 3.08.  Head CT without acute abnormality, shows small vessel disease.  Abdominal and chest x-ray without acute abnormality. 1 L bolus.  Hospitalist to admit for AKI, hypotension.  Review of Systems: As per HPI all other systems reviewed and negative.  Past Medical History:  Diagnosis Date   CAD (coronary artery disease)    IMI in 5/07 with CTO mid cx, 60% LAD, 99% RCA-> BMS; mod. impaired LV function; EF:35-40% 2/10   Cardiomyopathy ([place])    Cirrhosis ([place])    History of excessive alcohol use   CKD (chronic kidney disease) stage 4, GFR 15-29 ml/min (HCC)    Dementia (HCC)    Gastroesophageal reflux disease    Hyperlipidemia    Non-small cell carcinoma of left  lung, stage 1 ([place]) 12/28/2021    Past Surgical History:  Procedure Laterality Date   BIOPSY  12/05/2018   Procedure: BIOPSY;  Surgeon: Rogene Houston, MD;  [facility];  Service: Endoscopy;;  gastric   BIOPSY  05/09/2021   Procedure: BIOPSY;  Surgeon: Harvel Quale, MD;  [facility];  Service: Gastroenterology;;   BRONCHIAL BIOPSY  12/19/2021   Procedure: BRONCHIAL BIOPSIES;  Surgeon: Garner Nash, DO;  Location: [facility];  Service: Pulmonary;;   BRONCHIAL BRUSHINGS  12/19/2021   Procedure: BRONCHIAL BRUSHINGS;  Surgeon: Garner Nash, DO;  Location: [facility];  Service: Pulmonary;;   BRONCHIAL NEEDLE ASPIRATION BIOPSY  12/19/2021   Procedure: BRONCHIAL NEEDLE ASPIRATION BIOPSIES;  Surgeon: Garner Nash, DO;  Location: [facility];  Service: Pulmonary;;   COLONOSCOPY WITH PROPOFOL N/A 05/09/2021   Procedure: COLONOSCOPY WITH PROPOFOL;  Surgeon: Harvel Quale, MD;  [facility];  Service: Gastroenterology;  Laterality: N/A;  12:30   ESOPHAGOGASTRODUODENOSCOPY N/A 12/19/2016   Procedure: ESOPHAGOGASTRODUODENOSCOPY (EGD);  Surgeon: Rogene Houston, MD;  [facility];  Service: Endoscopy;  Laterality: N/A;   ESOPHAGOGASTRODUODENOSCOPY N/A 12/21/2016   Procedure: ESOPHAGOGASTRODUODENOSCOPY (EGD);  Surgeon: Danie Binder, MD;  [facility];  Service: Endoscopy;  Laterality: N/A;   ESOPHAGOGASTRODUODENOSCOPY (EGD) WITH PROPOFOL N/A 12/22/2016   Procedure: ESOPHAGOGASTRODUODENOSCOPY (EGD) WITH PROPOFOL;  Surgeon: Mauri Pole, MD;  Location: [facility];  Service: Endoscopy;  Laterality:  N/A;   ESOPHAGOGASTRODUODENOSCOPY (EGD) WITH PROPOFOL N/A 12/05/2018   Procedure: ESOPHAGOGASTRODUODENOSCOPY (EGD) WITH PROPOFOL;  Surgeon: Rogene Houston, MD;  [facility];  Service: Endoscopy;  Laterality: N/A;  1:35   ESOPHAGOGASTRODUODENOSCOPY (EGD) WITH PROPOFOL N/A 05/09/2021   Procedure:  ESOPHAGOGASTRODUODENOSCOPY (EGD) WITH PROPOFOL;  Surgeon: Harvel Quale, MD;  [facility];  Service: Gastroenterology;  Laterality: N/A;   FIDUCIAL MARKER PLACEMENT  12/19/2021   Procedure: FIDUCIAL MARKER PLACEMENT;  Surgeon: Garner Nash, DO;  Location: [facility];  Service: Pulmonary;;   POLYPECTOMY  05/09/2021   Procedure: POLYPECTOMY;  Surgeon: Harvel Quale, MD;  [facility];  Service: Gastroenterology;;   VIDEO BRONCHOSCOPY WITH RADIAL ENDOBRONCHIAL ULTRASOUND  12/19/2021   Procedure: RADIAL ENDOBRONCHIAL ULTRASOUND;  Surgeon: Garner Nash, DO;  Location: [facility];  Service: Pulmonary;;     reports that he has been smoking cigarettes. He has a 30.00 pack-year smoking history. He has never used smokeless tobacco. He reports that he does not currently use alcohol. He reports that he does not use drugs.  No Known Allergies  Family History  Problem Relation Age of Onset   Other Brother    Prior to Admission medications   Medication Sig Start Date End Date Taking? Authorizing Provider  allopurinol (ZYLOPRIM) 100 MG tablet Take 100 mg by mouth daily.    [provider]  atorvastatin (LIPITOR) 20 MG tablet Take 20 mg by mouth daily. 10/11/21   [provider]  carvedilol (COREG) 6.25 MG tablet Take 1 tablet (6.25 mg total) by mouth 2 (two) times daily with a meal. 01/09/20   Randie Tallarico, Courage, MD  folic acid (FOLVITE) 1 MG tablet Take 1 tablet (1 mg total) by mouth daily. 01/09/20   Roxan Hockey, MD  furosemide (LASIX) 40 MG tablet Take 40 mg by mouth daily.    [provider]  hydrALAZINE (APRESOLINE) 10 MG tablet Take 10 mg by mouth in the morning and at bedtime.    [provider]  isosorbide dinitrate (ISORDIL) 10 MG tablet Take 1 tablet (10 mg total) by mouth 2 (two) times daily. 01/09/20   Roxan Hockey, MD  Magnesium Oxide 400 (240 Mg) MG TABS Take 1 tablet (400 mg total) by mouth daily.  01/09/20   Roxan Hockey, MD  megestrol (MEGACE) 40 MG tablet megestrol 40 mg tablet  TAKE 1/2 TABLET (20MG ) BY MOUTH TWICE A DAY.    [provider]  Multiple Vitamins-Minerals (MULTIVITAMIN WITH MINERALS) tablet Take 1 tablet by mouth daily. 01/09/20   Roxan Hockey, MD  nitroGLYCERIN (NITROSTAT) 0.4 MG SL tablet Place 0.4 mg under the tongue every 5 (five) minutes as needed for chest pain.      [provider]  pantoprazole (PROTONIX) 40 MG tablet TAKE 1 TABLET BY MOUTH ONCE DAILY BEFORE BREAKFAST. 11/09/21   Montez Morita, Quillian Quince, MD  potassium chloride (KLOR-CON) 10 MEQ tablet Take 1 tablet (10 mEq total) by mouth daily. 01/09/20   Roxan Hockey, MD  senna-docusate (SENOKOT-S) 8.6-50 MG tablet Take 1 tablet by mouth daily.    [provider]  spironolactone (ALDACTONE) 25 MG tablet Take 12.5 mg by mouth daily.    [provider]  vitamin B-12 (CYANOCOBALAMIN) 100 MCG tablet Take 100 mcg by mouth daily. 05/11/21   [provider]    Physical Exam: Vitals:   02/14/22 1700 02/14/22 1715 02/14/22 1730 02/14/22 1745  BP: 107/61 107/61 103/78 123/71  Pulse:   (!) 103 [name])  110  Resp: 19 16 19  (!) 21  Temp:      TempSrc:      SpO2:   100% 100%  Weight:      Height:        Constitutional: NAD, calm, comfortable Vitals:   02/14/22 1700 02/14/22 1715 02/14/22 1730 02/14/22 1745  BP: 107/61 107/61 103/78 123/71  Pulse:   (!) 103 (!) 110  Resp: 19 16 19  (!) 21  Temp:      TempSrc:      SpO2:   100% 100%  Weight:      Height:       Eyes: PERRL, lids and conjunctivae normal ENMT: Mucous membranes are moist.  Neck: normal, supple, no masses, no thyromegaly Respiratory: clear to auscultation bilaterally, no wheezing, no crackles.  Cardiovascular: Regular rate and rhythm, no murmurs / rubs / gallops. No extremity edema.  Abdomen: no tenderness, no masses palpated. No hepatosplenomegaly. Bowel sounds positive.  Musculoskeletal: no  clubbing / cyanosis. No joint deformity upper and lower extremities.  Skin: no rashes, lesions, ulcers. No induration Neurologic: No apparent cranial nerve abnormality, 4+/5 strength in all extremities. Psychiatric: Normal judgment and insight. Alert and oriented x person and place. Normal mood.   Labs on Admission: I have personally reviewed following labs and imaging studies  CBC: Recent Labs  Lab 02/14/22 1616  WBC 3.5*  NEUTROABS 1.5*  HGB 9.1*  HCT 25.3*  MCV 96.2  PLT 354*   Basic Metabolic Panel: Recent Labs  Lab 02/14/22 1616  NA 131*  K 4.5  CL 105  CO2 20*  GLUCOSE 95  BUN 30*  CREATININE 3.08*  CALCIUM 8.6*   GFR: Estimated Creatinine Clearance: 14.7 mL/min (A) (by C-G formula based on SCr of 3.08 mg/dL (H)). Liver Function Tests: Recent Labs  Lab 02/14/22 1616  AST 24  ALT 20  ALKPHOS 51  BILITOT 0.5  PROT 8.8*  ALBUMIN 2.9*   Recent Labs  Lab 02/14/22 1621  LIPASE 52*    Radiological Exams on Admission: CT Head Wo Contrast  Result Date: 02/14/2022 CLINICAL DATA:  TIA EXAM: CT HEAD WITHOUT CONTRAST TECHNIQUE: Contiguous axial images were obtained from the base of the skull through the vertex without intravenous contrast. RADIATION DOSE REDUCTION: This exam was performed according to the departmental dose-optimization program which includes automated exposure control, adjustment of the mA and/or kV according to patient size and/or use of iterative reconstruction technique. COMPARISON:  12/18/2016 FINDINGS: Brain: No acute intracranial findings are seen in noncontrast CT brain. There are no signs of bleeding. Cortical sulci are prominent. There is decreased density in the periventricular white matter. Vascular: Unremarkable. Skull: Unremarkable. Sinuses/Orbits: Unremarkable. Other: None. IMPRESSION: No acute intracranial findings are seen in noncontrast CT brain. Atrophy. Small-vessel disease. Electronically Signed   By: Elmer Picker M.D.   On:  02/14/2022 16:57   DG Abdomen Acute W/Chest  Result Date: 02/14/2022 CLINICAL DATA:  Cough, weakness, loss of appetite EXAM: DG ABDOMEN ACUTE WITH 1 VIEW CHEST COMPARISON:  12/19/2021 FINDINGS: Supine and upright frontal views of the abdomen and pelvis as well as an upright frontal view of the chest are obtained. The cardiac silhouette is enlarged but stable. Continued bibasilar interstitial prominence unchanged since prior study. No airspace disease, effusion, or pneumothorax. Bowel gas pattern is unremarkable without obstruction or ileus. There are no masses or abnormal calcifications. No free gas in the greater peritoneal sac. No acute bony abnormalities. IMPRESSION: 1. No bowel obstruction or ileus. 2. Chronic  bibasilar pulmonary fibrosis.  No acute airspace disease. Electronically Signed   By: Randa Ngo M.D.   On: 02/14/2022 16:45    EKG: Independently reviewed.  Rate 83.  QTc 495.  Old LBBB.  No significant change from prior.  Assessment/Plan Principal Problem:   Acute kidney injury superimposed on chronic kidney disease (HCC) Active Problems:   Cirrhosis ([place])   Cigarette smoker   Anemia   Chronic systolic congestive heart failure (HCC)   Non-small cell carcinoma of left lung, stage 1 (HCC)   Assessment and Plan: * Acute kidney injury superimposed on chronic kidney disease (HCC) Creatinine elevated at 3.08, baseline about 1.8.  With transient hypotension, systolic down to 06/30.  Likely prerenal from poor oral intake over the past 1 to 2 weeks, in the setting of diuretic-Lasix use.   - 1L Normal saline given, continue N/s 75cc/hr x 20hrs - Med Rec pending but hold home Lasix, spironolactone - Check Mag  UTI (urinary tract infection) UA with large leukocytes, rare bacteria.  He is afebrile without leukocytosis.  Rules out for sepsis.  With his dementia and poor oral intake, will treat. -Follow-up urine cultures - IV ceftriaxone 1 g daily.  Non-small cell carcinoma of left  lung, stage 1 ([place]) Found on lung cancer screening CT, PET scan showed hypermetabolic lung nodule concerning for primary lung lesion/primary bronchogenic cancer.  Felt not to be surgical candidate due to ischemic cardiomyopathy potassium and other comorbidities.  Underwent fiberoptic bronchoscopy with with biopsies 12/19/2021-confirmed squamous cell carcinoma.  Plan was for radiation treatment which she has started.   Last radiation therapy 01/30/2022.  Cognitive impairment Currently at baseline.  Mostly problems with short-term memory loss.  Lives with his sister.  Chronic systolic congestive heart failure (HCC) Stable and compensated, appears dehydrated.  Last echocardiogram 11/2021, EF of less than 20% with grade 1 diastolic dysfunction. -Cautious with further IV fluids -Med Rec pending- but Hold Lasix, spironolactone for now -Hold Imdur, hydralazine, carvedilol with hypotension  Anemia Chronic anemia.  Hemoglobin 9.1, recent baseline 9-11.  History of GI blood loss, hemorrhagic shock, gastric ulcer.  Cirrhosis ([place]) Appears stable.  Baseline dementia mental status is intact.  Platelets 114. -Hold Lasix and spironolactone for now.    DVT prophylaxis: Lovenox Code Status: DNR Family Communication: Sister Jakim Drapeau is at bedside she patient's primary decision maker.  Patient lives with her. Disposition Plan: ~ 1 - 2 days Consults called: None Admission status: Obs tele  Author: Bethena Roys, MD 02/14/2022 8:08 PM  For on call review [email or web address].

## 2022-02-14 NOTE — ED Provider Notes (Signed)
[facility] EMERGENCY DEPARTMENT Provider Note   CSN: [id number] Arrival date & time: 02/14/22  1507     History  Chief Complaint  Patient presents with   Weakness    [name] is a 76 y.o. male.  HPI  Medical history including liver cirrhosis secondary due to alcohol, squamous cell carcinoma left lower lung status post lobectomy last radiation treatment was a month ago presents presents without complaints.  He states that he is here because his partner want him to be checked out.  He does not endorse headaches fevers chills chest pain shortness of breath stomach pains nausea vomiting diarrhea he states that the only that hurts is "his wallet"  Partner is at bedside informs that patient's not been eating for the last week, states he does not want to eat and rather just smokes cigarettes, she states that he is likely dehydrated and needs some fluids.  She also notes that he appears to be slightly dizzy especially and goes from a sitting to standing position, appears slightly off balance, no recent head trauma, not on anticoag's, he has not had any fevers chills cough congestion no stomach pains nausea or vomiting.    Home Medications Prior to Admission medications   Medication Sig Start Date End Date Taking? Authorizing Provider  allopurinol (ZYLOPRIM) 100 MG tablet Take 100 mg by mouth daily.    [provider]  atorvastatin (LIPITOR) 20 MG tablet Take 20 mg by mouth daily. 10/11/21   [provider]  carvedilol (COREG) 6.25 MG tablet Take 1 tablet (6.25 mg total) by mouth 2 (two) times daily with a meal. 01/09/20   Emokpae, Courage, MD  folic acid (FOLVITE) 1 MG tablet Take 1 tablet (1 mg total) by mouth daily. 01/09/20   Roxan Hockey, MD  furosemide (LASIX) 40 MG tablet Take 40 mg by mouth daily.    [provider]  hydrALAZINE (APRESOLINE) 10 MG tablet Take 10 mg by mouth in the morning and at bedtime.    [provider]  isosorbide  dinitrate (ISORDIL) 10 MG tablet Take 1 tablet (10 mg total) by mouth 2 (two) times daily. 01/09/20   Roxan Hockey, MD  Magnesium Oxide 400 (240 Mg) MG TABS Take 1 tablet (400 mg total) by mouth daily. 01/09/20   Roxan Hockey, MD  megestrol (MEGACE) 40 MG tablet megestrol 40 mg tablet  TAKE 1/2 TABLET (20MG ) BY MOUTH TWICE A DAY.    [provider]  Multiple Vitamins-Minerals (MULTIVITAMIN WITH MINERALS) tablet Take 1 tablet by mouth daily. 01/09/20   Roxan Hockey, MD  nitroGLYCERIN (NITROSTAT) 0.4 MG SL tablet Place 0.4 mg under the tongue every 5 (five) minutes as needed for chest pain.      [provider]  pantoprazole (PROTONIX) 40 MG tablet TAKE 1 TABLET BY MOUTH ONCE DAILY BEFORE BREAKFAST. 11/09/21   Montez Morita, Quillian Quince, MD  potassium chloride (KLOR-CON) 10 MEQ tablet Take 1 tablet (10 mEq total) by mouth daily. 01/09/20   Roxan Hockey, MD  senna-docusate (SENOKOT-S) 8.6-50 MG tablet Take 1 tablet by mouth daily.    [provider]  spironolactone (ALDACTONE) 25 MG tablet Take 12.5 mg by mouth daily.    [provider]  vitamin B-12 (CYANOCOBALAMIN) 100 MCG tablet Take 100 mcg by mouth daily. 05/11/21   [provider]      Allergies    Patient has no known allergies.    Review of Systems   Review of Systems  Constitutional:  Negative for chills and fever.  Respiratory:  Negative for shortness of breath.   Cardiovascular:  Negative for chest pain.  Gastrointestinal:  Negative for abdominal pain.  Neurological:  Negative for headaches.   Physical Exam Updated Vital Signs BP 123/71 (BP Location: Left Arm)   Pulse (!) 110   Temp 97.7 F (36.5 C) (Oral)   Resp (!) 21   Ht 5\' 5"  (1.651 m)   Wt 51.7 kg   SpO2 100%   BMI 18.97 kg/m  Physical Exam Vitals and nursing note reviewed.  Constitutional:      General: He is not in acute distress.    Appearance: He is not ill-appearing.     Comments: Deconditioned state   HENT:     Head: Normocephalic and atraumatic.     Comments: No deformity of the head present no raccoon eyes or battle sign noted.    Nose: No congestion.     Mouth/Throat:     Mouth: Mucous membranes are dry.     Pharynx: Oropharynx is clear. No oropharyngeal exudate or posterior oropharyngeal erythema.     Comments: No trismus no torticollis no oral trauma present. Eyes:     Extraocular Movements: Extraocular movements intact.     Conjunctiva/sclera: Conjunctivae normal.     Pupils: Pupils are equal, round, and reactive to light.  Cardiovascular:     Rate and Rhythm: Normal rate and regular rhythm.     Pulses: Normal pulses.     Heart sounds: No murmur heard.   No friction rub. No gallop.  Pulmonary:     Effort: No respiratory distress.     Breath sounds: No wheezing, rhonchi or rales.  Abdominal:     Palpations: Abdomen is soft.     Tenderness: There is no abdominal tenderness. There is no right CVA tenderness or left CVA tenderness.  Skin:    General: Skin is warm and dry.  Neurological:     Mental Status: He is alert.     Cranial Nerves: Cranial nerves 2-12 are intact. No cranial nerve deficit.     Motor: Motor function is intact. No weakness.     Coordination: Romberg sign negative. Heel to [facility] Test normal.     Gait: Gait is intact.     Comments: Cranial nerves II through XII grossly intact no difficulty word finding following two-step commands no regular weakness present.  X4 able to identify common objects like a cell phone.  Gait is fully intact.  Psychiatric:        Mood and Affect: Mood normal.    ED Results / Procedures / Treatments   Labs (all labs ordered are listed, but only abnormal results are displayed) Labs Reviewed  CBC WITH DIFFERENTIAL/PLATELET - Abnormal; Notable for the following components:      Result Value   WBC 3.5 (*)    RBC 2.63 (*)    Hemoglobin 9.1 (*)    HCT 25.3 (*)    MCH 34.6 (*)    Platelets 114 (*)    Neutro Abs 1.5 (*)    All  other components within normal limits  COMPREHENSIVE METABOLIC PANEL - Abnormal; Notable for the following components:   Sodium 131 (*)    CO2 20 (*)    BUN 30 (*)    Creatinine, Ser 3.08 (*)    Calcium 8.6 (*)    Total Protein 8.8 (*)    Albumin 2.9 (*)    GFR, Estimated 20 (*)    All  other components within normal limits  LIPASE, BLOOD - Abnormal; Notable for the following components:   Lipase 52 (*)    All other components within normal limits  URINALYSIS, ROUTINE W REFLEX MICROSCOPIC    EKG EKG Interpretation  Date/Time:  Wednesday Feb 14 2022 16:23:08 EDT Ventricular Rate:  83 PR Interval:  184 QRS Duration: 149 QT Interval:  421 QTC Calculation: 495 R Axis:   110 Text Interpretation: Sinus rhythm Left bundle branch block No significant change since last tracing Confirmed by Dorie Rank [phone]) on 02/14/2022 4:32:43 PM  Radiology CT Head Wo Contrast  Result Date: 02/14/2022 CLINICAL DATA:  TIA EXAM: CT HEAD WITHOUT CONTRAST TECHNIQUE: Contiguous axial images were obtained from the base of the skull through the vertex without intravenous contrast. RADIATION DOSE REDUCTION: This exam was performed according to the departmental dose-optimization program which includes automated exposure control, adjustment of the mA and/or kV according to patient size and/or use of iterative reconstruction technique. COMPARISON:  12/18/2016 FINDINGS: Brain: No acute intracranial findings are seen in noncontrast CT brain. There are no signs of bleeding. Cortical sulci are prominent. There is decreased density in the periventricular white matter. Vascular: Unremarkable. Skull: Unremarkable. Sinuses/Orbits: Unremarkable. Other: None. IMPRESSION: No acute intracranial findings are seen in noncontrast CT brain. Atrophy. Small-vessel disease. Electronically Signed   By: Elmer Picker M.D.   On: 02/14/2022 16:57   DG Abdomen Acute W/Chest  Result Date: 02/14/2022 CLINICAL DATA:  Cough, weakness,  loss of appetite EXAM: DG ABDOMEN ACUTE WITH 1 VIEW CHEST COMPARISON:  12/19/2021 FINDINGS: Supine and upright frontal views of the abdomen and pelvis as well as an upright frontal view of the chest are obtained. The cardiac silhouette is enlarged but stable. Continued bibasilar interstitial prominence unchanged since prior study. No airspace disease, effusion, or pneumothorax. Bowel gas pattern is unremarkable without obstruction or ileus. There are no masses or abnormal calcifications. No free gas in the greater peritoneal sac. No acute bony abnormalities. IMPRESSION: 1. No bowel obstruction or ileus. 2. Chronic bibasilar pulmonary fibrosis.  No acute airspace disease. Electronically Signed   By: Randa Ngo M.D.   On: 02/14/2022 16:45    Procedures Procedures    Medications Ordered in ED Medications  sodium chloride 0.9 % bolus 1,000 mL (0 mLs Intravenous Stopped 02/14/22 1740)    ED Course/ Medical Decision Making/ A&P                           Medical Decision Making Amount and/or Complexity of Data Reviewed Labs: ordered. Radiology: ordered.  Risk Decision regarding hospitalization.   This patient presents to the ED for concern of failure to thrive, this involves an extensive number of treatment options, and is a complaint that carries with it a high risk of complications and morbidity.  The differential diagnosis includes electrolyte derailment, sepsis, metastatic disease    Additional history obtained:  Additional history obtained from partner at bedside External records from outside source obtained and reviewed including previous ER notes, pulmonology notes   Co morbidities that complicate the patient evaluation  Squamous cell carcinoma, liver cirrhosis  Social Determinants of Health:  Geriatric    Lab Tests:  I Ordered, and personally interpreted labs.  The pertinent results include: CBC shows leukocytopenia with CBC of 3.5, hemoglobin 9.1, CMP shows sodium  131 CO2 of 20, BUN 30, creatinine 3.08 lipase 52   Imaging Studies ordered:  I ordered imaging studies including acute chest  abdomen, CT head I independently visualized and interpreted imaging which showed both negative for acute findings I agree with the radiologist interpretation   Cardiac Monitoring:  The patient was maintained on a cardiac monitor.  I personally viewed and interpreted the cardiac monitored which showed an underlying rhythm of: Without signs of ischemia   Medicines ordered and prescription drug management:  I ordered medication including fluids I have reviewed the patients home medicines and have made adjustments as needed  Critical Interventions:  N/A   Reevaluation:  Presents with failure to thrive, he had benign physical exam, unclear etiology, will obtain basic lab work-up, add on acute chest abdomen and rule out of bowel obstruction, will also obtain CT head for rule out of metastatic disease and reassess.  Patient was reassessed was resting comfortably, vital signs have improved after liter of fluids, due to his significant increase in creatinine still decrease intake I do recommend admission for observation patient agreed this plan will consult hospice team.    Consultations Obtained:  I requested consultation with the hospitalist Dr. Denton Brick ,  and discussed lab and imaging findings as well as pertinent plan - they recommend: She will admit the patient.    Test Considered:  N/A    Rule out Low suspicion for intracranial head bleed or cranial masses CT imaging is negative these findings.  Low suspicion for CVA no focal deficit on my exam.  I have low suspicion for ACS denies any chest pain or shortness of breath EKG without signs of ischemia.  I am suspicion for bowel obstruction as abdomen soft nontender imaging is negative these findings.  I have low suspicion for UTI [place], kidney stone he denies any urinary symptoms.  He does have an  elevated creatinine which I suspect is likely secondary due to dehydration from poor oral intake.    Dispostion and problem list  After consideration of the diagnostic results and the patients response to treatment, I feel that the patent would benefit from admission.  AKI-likely secondary due to poor oral intake, he is received a liter of fluids in the emergency department, will need further observation by admitting team.               Final Clinical Impression(s) / ED Diagnoses Final diagnoses:  Dehydration  AKI (acute kidney injury) [place])    Rx / [place] Orders ED Discharge Orders     None         Marcello Fennel, PA-C 02/14/22 1847    Godfrey Pick, MD 02/19/22 0745

## 2022-02-14 NOTE — Assessment & Plan Note (Addendum)
Currently at baseline.  Mostly problems with short-term memory loss.  Lives with his sister.

## 2022-02-14 NOTE — Assessment & Plan Note (Addendum)
Baseline creatinine 1.6-1.8 Presented with serum creatinine 3.08 - 1L Normal saline given, continue N/s 75cc/hr x 20hrs - initially held home Lasix, spironolactone - serum creatinine 1.65 at time of d/c

## 2022-02-14 NOTE — ED Triage Notes (Signed)
Pt has not been eating or drinking lately; pt has lung cancer and his last radiation treatment was in April; pt denies any pain

## 2022-02-14 NOTE — Assessment & Plan Note (Addendum)
Stable and compensated, appears dehydrated.  Last echocardiogram 11/2021, EF of less than 20% with grade 1 diastolic dysfunction. -Cautious with further IV fluids>>stopped -Hold Imdur, hydralazine, carvedilol with hypotension

## 2022-02-14 NOTE — ED Notes (Signed)
Attempted report to floor x 1

## 2022-02-14 NOTE — Assessment & Plan Note (Signed)
Found on lung cancer screening CT, PET scan showed hypermetabolic lung nodule concerning for primary lung lesion/primary bronchogenic cancer.  Felt not to be surgical candidate due to ischemic cardiomyopathy potassium and other comorbidities.  Underwent fiberoptic bronchoscopy with with biopsies 12/19/2021-confirmed squamous cell carcinoma.  Plan was for radiation treatment which she has started.   Last radiation therapy 01/30/2022.

## 2022-02-14 NOTE — Assessment & Plan Note (Addendum)
UA >50 WBC, rare bacteria.   -He is afebrile without leukocytosis.  Rules out for sepsis.  With his dementia and poor oral intake, will treat. -Follow-up urine cultures>>unrevealing - IV ceftriaxone 1 g daily>>cephalexin>>finished 5 days antibiotics during hospitalization

## 2022-02-14 NOTE — Assessment & Plan Note (Addendum)
Anemia of Chronic disease recent baseline 9.  History of GI blood loss, hemorrhagic shock, gastric ulcer. No evidence for active blood loss currently

## 2022-02-14 NOTE — ED Notes (Signed)
Pt in CT/xray

## 2022-02-14 NOTE — Assessment & Plan Note (Signed)
Appears stable.  Baseline dementia mental status is intact.  Platelets 114. -Hold Lasix and spironolactone initially - resumed lasix

## 2022-02-15 DIAGNOSIS — R4189 Other symptoms and signs involving cognitive functions and awareness: Secondary | ICD-10-CM | POA: Diagnosis not present

## 2022-02-15 DIAGNOSIS — I5022 Chronic systolic (congestive) heart failure: Secondary | ICD-10-CM | POA: Diagnosis not present

## 2022-02-15 DIAGNOSIS — K7460 Unspecified cirrhosis of liver: Secondary | ICD-10-CM | POA: Diagnosis not present

## 2022-02-15 DIAGNOSIS — N1832 Chronic kidney disease, stage 3b: Secondary | ICD-10-CM | POA: Diagnosis present

## 2022-02-15 DIAGNOSIS — N179 Acute kidney failure, unspecified: Secondary | ICD-10-CM | POA: Diagnosis present

## 2022-02-15 DIAGNOSIS — Z66 Do not resuscitate: Secondary | ICD-10-CM | POA: Diagnosis present

## 2022-02-15 DIAGNOSIS — I130 Hypertensive heart and chronic kidney disease with heart failure and stage 1 through stage 4 chronic kidney disease, or unspecified chronic kidney disease: Secondary | ICD-10-CM | POA: Diagnosis present

## 2022-02-15 DIAGNOSIS — I950 Idiopathic hypotension: Secondary | ICD-10-CM | POA: Diagnosis not present

## 2022-02-15 DIAGNOSIS — F17210 Nicotine dependence, cigarettes, uncomplicated: Secondary | ICD-10-CM | POA: Diagnosis not present

## 2022-02-15 DIAGNOSIS — E860 Dehydration: Secondary | ICD-10-CM | POA: Diagnosis present

## 2022-02-15 DIAGNOSIS — Z8711 Personal history of peptic ulcer disease: Secondary | ICD-10-CM | POA: Diagnosis not present

## 2022-02-15 DIAGNOSIS — N189 Chronic kidney disease, unspecified: Secondary | ICD-10-CM | POA: Diagnosis not present

## 2022-02-15 DIAGNOSIS — F0390 Unspecified dementia without behavioral disturbance: Secondary | ICD-10-CM | POA: Diagnosis present

## 2022-02-15 DIAGNOSIS — D631 Anemia in chronic kidney disease: Secondary | ICD-10-CM | POA: Diagnosis present

## 2022-02-15 DIAGNOSIS — Z902 Acquired absence of lung [part of]: Secondary | ICD-10-CM | POA: Diagnosis not present

## 2022-02-15 DIAGNOSIS — R911 Solitary pulmonary nodule: Secondary | ICD-10-CM | POA: Diagnosis present

## 2022-02-15 DIAGNOSIS — Z79899 Other long term (current) drug therapy: Secondary | ICD-10-CM | POA: Diagnosis not present

## 2022-02-15 DIAGNOSIS — N390 Urinary tract infection, site not specified: Secondary | ICD-10-CM | POA: Diagnosis present

## 2022-02-15 DIAGNOSIS — C3492 Malignant neoplasm of unspecified part of left bronchus or lung: Secondary | ICD-10-CM | POA: Diagnosis not present

## 2022-02-15 DIAGNOSIS — I255 Ischemic cardiomyopathy: Secondary | ICD-10-CM | POA: Diagnosis present

## 2022-02-15 DIAGNOSIS — Z79818 Long term (current) use of other agents affecting estrogen receptors and estrogen levels: Secondary | ICD-10-CM | POA: Diagnosis not present

## 2022-02-15 DIAGNOSIS — I959 Hypotension, unspecified: Secondary | ICD-10-CM | POA: Diagnosis present

## 2022-02-15 DIAGNOSIS — E871 Hypo-osmolality and hyponatremia: Secondary | ICD-10-CM | POA: Diagnosis present

## 2022-02-15 LAB — CBC
HCT: 26.3 % — ABNORMAL LOW (ref 39.0–52.0)
Hemoglobin: 9.1 g/dL — ABNORMAL LOW (ref 13.0–17.0)
MCH: 34.1 pg — ABNORMAL HIGH (ref 26.0–34.0)
MCHC: 34.6 g/dL (ref 30.0–36.0)
MCV: 98.5 fL (ref 80.0–100.0)
Platelets: 109 10*3/uL — ABNORMAL LOW (ref 150–400)
RBC: 2.67 MIL/uL — ABNORMAL LOW (ref 4.22–5.81)
RDW: 14.5 % (ref 11.5–15.5)
WBC: 3.6 10*3/uL — ABNORMAL LOW (ref 4.0–10.5)
nRBC: 0 % (ref 0.0–0.2)

## 2022-02-15 LAB — BASIC METABOLIC PANEL
Anion gap: 8 (ref 5–15)
BUN: 27 mg/dL — ABNORMAL HIGH (ref 8–23)
CO2: 18 mmol/L — ABNORMAL LOW (ref 22–32)
Calcium: 8.5 mg/dL — ABNORMAL LOW (ref 8.9–10.3)
Chloride: 107 mmol/L (ref 98–111)
Creatinine, Ser: 2.2 mg/dL — ABNORMAL HIGH (ref 0.61–1.24)
GFR, Estimated: 30 mL/min — ABNORMAL LOW (ref >60)
Glucose, Bld: 83 mg/dL (ref 70–99)
Potassium: 5 mmol/L (ref 3.5–5.1)
Sodium: 133 mmol/L — ABNORMAL LOW (ref 135–145)

## 2022-02-15 MED ORDER — SODIUM CHLORIDE 0.9 % IV SOLN: INTRAVENOUS | Status: DC

## 2022-02-15 MED ORDER — SODIUM CHLORIDE 0.9 % IV SOLN: INTRAVENOUS | Status: AC

## 2022-02-15 NOTE — Progress Notes (Signed)
  Transition of Care [facility]) Screening Note   Patient Details  Name: [name] Date of Birth: 01/19/1946   Transition of Care [facility] CM/SW Contact:    Iona Beard, [place] Phone Number: 02/15/2022, 9:49 AM    Transition of Care [facility]) has reviewed patient and no TOC needs have been identified at this time. We will continue to monitor patient advancement through interdisciplinary progression rounds. If new patient transition needs arise, please place a TOC consult.

## 2022-02-15 NOTE — Progress Notes (Signed)
[facility]                                                                              Peng Thorstenson, is a 76 y.o. male, DOB - 04/06/1946, OFB:[id number] Admit date - 02/14/2022    Outpatient Primary MD for the patient is [name], [name], MD  LOS - 0  days  Chief Complaint  Patient presents with   Weakness       Brief summary   Patient is a 76 year old male with history of lung cancer, liver cirrhosis, tobacco use, systolic CHF, EF less than 20%, dementia presented from home with poor p.o. intake.  He has not been eating or drinking for almost last 1 to 2 weeks.  Patient has baseline dementia but able to answer simple questions appropriately.  Patient's sister reported that he had no appetite, no vomiting or diarrhea, no abdominal pain or fevers.  She was concerned that he was getting dehydrated.  When patient stood up he was getting dizzy.  At baseline ambulates with a cane however has some memory problems. Creatinine elevated at 3.08.  BP dropped to 89/50 in ED, UA positive for UTI  Assessment & Plan    Principal Problem:   Acute kidney injury superimposed on chronic kidney disease stage IIIb (HCC) -Likely worsened due to poor p.o. intake in the last 1 to 2 weeks -Baseline creatinine around 1.8 -Presented with creatinine of 3.08, placed on gentle hydration, held Lasix, Aldactone -Creatinine improving, 2.2  Active Problems: Hypotension -Has history of hypertension, BP in 80s in ED -Continue to hold hydralazine, isosorbide, Lasix, Aldactone, Coreg -BP still soft, will obtain orthostatics -Continue gentle hydration   UTI (urinary tract infection) -Continue IV Rocephin, follow urine culture and sensitivities     Non-small cell carcinoma of left lung, stage 1 (HCC) -Found on lung cancer screening CT.  PET scan showed hypermetabolic lung nodule concerning for primary lung bronchogenic CA.  Felt not to be a surgical candidate due to ischemic  cardiomyopathy. -Biopsy showed squamous cell carcinoma, receiving XRT  Dementia -Currently stable, no acute issues -Delirium precaution    Cirrhosis (HCC) -Hold Aldactone, Lasix due to AKI, poor p.o. intake     Chronic normocytic anemia -Chronic anemia, baseline 9-11 with prior history of GI blood loss, hemorrhagic shock and gastric ulcer -Currently at baseline    Chronic systolic congestive heart failure (HCC) -Stable and appears to be dehydrated at this time.  Last echo 3/23 showed EF of less than 20% with grade 1 DD. -Continue to hold Lasix, Aldactone, Imdur, hydralazine, Coreg due to hypotension -Cautious IV fluid hydration  Hyponatremia -Likely due to poor p.o. intake, continue IV fluid hydration  Underweight, moderate to severe protein calorie malnutrition, malignancy Estimated body mass index is 19.04 kg/m as calculated from the following:   Height as of this encounter: 5\' 5"  (1.651 m).   Weight as of this encounter: 51.9 kg.  Code Status: DNR DVT Prophylaxis:  enoxaparin (LOVENOX) injection 30 mg Start: 02/14/22 2200   Level of Care: Level of care: Telemetry Family Communication:  Disposition Plan:      Remains inpatient appropriate: Creatinine not  at baseline   Procedures:  None  Consultants:   None  Antimicrobials:   Anti-infectives (From admission, onward)    Start     Dose/Rate Route Frequency Ordered Stop   02/14/22 2015  cefTRIAXone (ROCEPHIN) 1 g in sodium chloride 0.9 % 100 mL IVPB        1 g 200 mL/hr over 30 Minutes Intravenous Every 24 hours 02/14/22 2014            Medications  enoxaparin (LOVENOX) injection  30 mg Subcutaneous Q24H      Subjective:   Beckett Maden was seen and examined today.  No acute complaints, has dementia.  No pain, no nausea vomiting or diarrhea.  No abdominal pain Objective:   Vitals:   02/14/22 2028 02/15/22 0145 02/15/22 0538 02/15/22 1016  BP:  110/74 100/62 (!) 87/58  Pulse:  (!) 105 94 83   Resp: 18 18 17    Temp:  98.2 F (36.8 C) 99.1 F (37.3 C) 98.8 F (37.1 C)  TempSrc:  Oral    SpO2:  99% 99% 100%  Weight: 51.9 kg     Height: 5\' 5"  (1.651 m)       Intake/Output Summary (Last 24 hours) at 02/15/2022 1035 Last data filed at 02/15/2022 0911 Gross per 24 hour  Intake 2100.1 ml  Output 700 ml  Net 1400.1 ml     Wt Readings from Last 3 Encounters:  02/14/22 51.9 kg  01/09/22 56.2 kg  12/26/21 55.5 kg     Exam General: Alert and oriented x self, pleasant, NAD Cardiovascular: S1 S2 auscultated,  RRR Respiratory: Clear to auscultation bilaterally, no wheezing Gastrointestinal: Soft, nontender, nondistended, + bowel sounds Ext: no pedal edema bilaterally Neuro: Strength 5/5 upper and lower extremities bilaterally Psych: has dementia    Data Reviewed:  I have personally reviewed following labs    CBC Lab Results  Component Value Date   WBC 3.6 (L) 02/15/2022   RBC 2.67 (L) 02/15/2022   HGB 9.1 (L) 02/15/2022   HCT 26.3 (L) 02/15/2022   MCV 98.5 02/15/2022   MCH 34.1 (H) 02/15/2022   PLT 109 (L) 02/15/2022   MCHC 34.6 02/15/2022   RDW 14.5 02/15/2022   LYMPHSABS 1.6 02/14/2022   MONOABS 0.2 02/14/2022   EOSABS 0.2 02/14/2022   BASOSABS 0.0 70/35/0093     Last metabolic panel Lab Results  Component Value Date   NA 133 (L) 02/15/2022   K 5.0 02/15/2022   CL 107 02/15/2022   CO2 18 (L) 02/15/2022   BUN 27 (H) 02/15/2022   CREATININE 2.20 (H) 02/15/2022   GLUCOSE 83 02/15/2022   GFRNONAA 30 (L) 02/15/2022   GFRAA 41 (L) 01/09/2020   CALCIUM 8.5 (L) 02/15/2022   PHOS 2.1 (L) 12/26/2016   PROT 8.8 (H) 02/14/2022   ALBUMIN 2.9 (L) 02/14/2022   LABGLOB 5.0 (H) 09/14/2019   AGRATIO 0.6 (L) 09/14/2019   BILITOT 0.5 02/14/2022   ALKPHOS 51 02/14/2022   AST 24 02/14/2022   ALT 20 02/14/2022   ANIONGAP 8 02/15/2022    CBG (last 3)  Recent Labs    02/14/22 2203  GLUCAP 100*      Coagulation Profile: No results for input(s): INR,  PROTIME in the last 168 hours.   Radiology Studies: I have personally reviewed the imaging studies  CT Head Wo Contrast  Result Date: 02/14/2022 CLINICAL DATA:  TIA EXAM: CT HEAD WITHOUT CONTRAST TECHNIQUE: Contiguous axial images were obtained from the base of the  skull through the vertex without intravenous contrast. RADIATION DOSE REDUCTION: This exam was performed according to the departmental dose-optimization program which includes automated exposure control, adjustment of the mA and/or kV according to patient size and/or use of iterative reconstruction technique. COMPARISON:  12/18/2016 FINDINGS: Brain: No acute intracranial findings are seen in noncontrast CT brain. There are no signs of bleeding. Cortical sulci are prominent. There is decreased density in the periventricular white matter. Vascular: Unremarkable. Skull: Unremarkable. Sinuses/Orbits: Unremarkable. Other: None. IMPRESSION: No acute intracranial findings are seen in noncontrast CT brain. Atrophy. Small-vessel disease. Electronically Signed   By: Elmer Picker M.D.   On: 02/14/2022 16:57   DG Abdomen Acute W/Chest  Result Date: 02/14/2022 CLINICAL DATA:  Cough, weakness, loss of appetite EXAM: DG ABDOMEN ACUTE WITH 1 VIEW CHEST COMPARISON:  12/19/2021 FINDINGS: Supine and upright frontal views of the abdomen and pelvis as well as an upright frontal view of the chest are obtained. The cardiac silhouette is enlarged but stable. Continued bibasilar interstitial prominence unchanged since prior study. No airspace disease, effusion, or pneumothorax. Bowel gas pattern is unremarkable without obstruction or ileus. There are no masses or abnormal calcifications. No free gas in the greater peritoneal sac. No acute bony abnormalities. IMPRESSION: 1. No bowel obstruction or ileus. 2. Chronic bibasilar pulmonary fibrosis.  No acute airspace disease. Electronically Signed   By: Randa Ngo M.D.   On: 02/14/2022 16:45       Jahni Nazar M.D. [facility] 02/15/2022, 10:35 AM  Available via Epic secure chat 7am-7pm After 7 pm, please refer to night coverage provider listed on amion.

## 2022-02-15 NOTE — Progress Notes (Signed)
Per tele patient had a 7 beat run of VTach and then a 11 beat run of VTach. Vitals stable, patient in no distress. Message sent to MD Rai.

## 2022-02-15 NOTE — Evaluation (Signed)
Physical Therapy Evaluation Patient Details Name: [name] MRN: [id number] DOB: [birth year] Today's Date: 02/15/2022  History of Present Illness  [name] is a 76 y.o. male with medical history significant for lung cancer, liver cirrhosis, tobacco abuse, systolic CHF, dementia.  Note that he is not eating or drinking for almost 1 to 2 weeks.  Patient has baseline dementia, he is able to answer simple questions appropriately but he does not give any further details.  Patient's Sister [name] is at bedside.  She reports patient has not had any appetite.  Patient denies any vomiting no loose stools, no abdominal pain, no fevers, he has no complaints.  Patient's sister brought him to the hospital as she is concerned patient is dehydrated.  When patient stands up he gets dizzy.  At baseline patient ambulates with a cane, is able to recognize family, answers simple questions, but has some memory problems, and does not know time-the month or year.   Clinical Impression  Patient functioning at baseline for functional mobility and gait demonstrating good return for ambulation on level, inclined and declined surfaces without loss of balance.  Plan:  Patient discharged from physical therapy to care of nursing for ambulation daily as tolerated for length of stay.         Recommendations for follow up therapy are one component of a multi-disciplinary discharge planning process, led by the attending physician.  Recommendations may be updated based on patient status, additional functional criteria and insurance authorization.  Follow Up Recommendations No PT follow up    Assistance Recommended at Discharge PRN  Patient can return home with the following  Assistance with cooking/housework    Equipment Recommendations None recommended by PT  Recommendations for Other Services       Functional Status Assessment Patient has not had a recent decline in their functional status      Precautions / Restrictions Precautions Precautions: None Restrictions Weight Bearing Restrictions: No      Mobility  Bed Mobility Overal bed mobility: Independent                  Transfers Overall transfer level: Independent                      Ambulation/Gait Ambulation/Gait assistance: Modified independent (Device/Increase time) Gait Distance (Feet): 200 Feet Assistive device: None Gait Pattern/deviations: WFL(Within Functional Limits) Gait velocity: near normal     General Gait Details: grossly WFL with good return for ambulation on level, inclined and declined surfaces without loss of balance  Stairs            Wheelchair Mobility    Modified Rankin (Stroke Patients Only)       Balance Overall balance assessment: No apparent balance deficits (not formally assessed)                                           Pertinent Vitals/Pain Pain Assessment Pain Assessment: No/denies pain    Home Living Family/patient expects to be discharged to:: Private residence Living Arrangements: Other relatives Available Help at Discharge: Family;Available 24 hours/day Type of Home: House Home Access: Level entry       Home Layout: One level Home Equipment: Cane - single point      Prior Function Prior Level of Function : Independent/Modified Independent  Mobility Comments: Hydrographic surveyor without AD, does not drive ADLs Comments: Assisted for community ADLs by family     Hand Dominance   Dominant Hand: Right    Extremity/Trunk Assessment   Upper Extremity Assessment Upper Extremity Assessment: Overall WFL for tasks assessed    Lower Extremity Assessment Lower Extremity Assessment: Overall WFL for tasks assessed    Cervical / Trunk Assessment Cervical / Trunk Assessment: Normal  Communication   Communication: No difficulties  Cognition Arousal/Alertness: Awake/alert Behavior During Therapy:  WFL for tasks assessed/performed Overall Cognitive Status: Within Functional Limits for tasks assessed                                          General Comments      Exercises     Assessment/Plan    PT Assessment Patient does not need any further PT services  PT Problem List         PT Treatment Interventions      PT Goals (Current goals can be found in the Care Plan section)  Acute Rehab PT Goals Patient Stated Goal: return home with family to assist PT Goal Formulation: With patient Time For Goal Achievement: 02/15/22 Potential to Achieve Goals: Good    Frequency       Co-evaluation               AM-PAC PT "6 Clicks" Mobility  Outcome Measure Help needed turning from your back to your side while in a flat bed without using bedrails?: None Help needed moving from lying on your back to sitting on the side of a flat bed without using bedrails?: None Help needed moving to and from a bed to a chair (including a wheelchair)?: None Help needed standing up from a chair using your arms (e.g., wheelchair or bedside chair)?: None Help needed to walk in hospital room?: None Help needed climbing 3-5 steps with a railing? : None 6 Click Score: 24    End of Session   Activity Tolerance: Patient tolerated treatment well Patient left: in chair;with call bell/phone within reach Nurse Communication: Mobility status PT Visit Diagnosis: Unsteadiness on feet (R26.81);Other abnormalities of gait and mobility (R26.89);Muscle weakness (generalized) (M62.81)    Time: 1324-4010 PT Time Calculation (min) (ACUTE ONLY): 10 min   Charges:   PT Evaluation $PT Eval Low Complexity: 1 Low PT Treatments $Therapeutic Activity: 8-22 mins        10:22 AM, 02/15/22 Lonell Grandchild, MPT Physical Therapist with [facility] [phone] office [phone] mobile phone

## 2022-02-15 NOTE — Progress Notes (Signed)
Patient calm and pleasant. He is sitting up in the recliner chair where he has been for several hours. He will answer simple yes or no questions but does not say much more than that.

## 2022-02-16 LAB — URINE CULTURE: Culture: <10000 — AB

## 2022-02-16 LAB — GLUCOSE, CAPILLARY: Glucose-Capillary: 73 mg/dL (ref 70–99)

## 2022-02-16 LAB — BASIC METABOLIC PANEL
Anion gap: 8 (ref 5–15)
BUN: 23 mg/dL (ref 8–23)
CO2: 17 mmol/L — ABNORMAL LOW (ref 22–32)
Calcium: 8.3 mg/dL — ABNORMAL LOW (ref 8.9–10.3)
Chloride: 110 mmol/L (ref 98–111)
Creatinine, Ser: 1.58 mg/dL — ABNORMAL HIGH (ref 0.61–1.24)
GFR, Estimated: 45 mL/min — ABNORMAL LOW (ref >60)
Glucose, Bld: 73 mg/dL (ref 70–99)
Potassium: 4.4 mmol/L (ref 3.5–5.1)
Sodium: 135 mmol/L (ref 135–145)

## 2022-02-16 MED ORDER — CARVEDILOL 6.25 MG PO TABS: 6.2500 mg | ORAL_TABLET | Freq: Two times a day (BID) | ORAL | 3 refills | Status: DC

## 2022-02-16 MED ORDER — CARVEDILOL 3.125 MG PO TABS
3.1250 mg | ORAL_TABLET | Freq: Two times a day (BID) | ORAL | Status: DC
  Administered 2022-02-16 – 2022-02-18 (×2): 3.125 mg via ORAL
  Filled 2022-02-16 (×4): qty 1

## 2022-02-16 MED ORDER — SPIRONOLACTONE 12.5 MG HALF TABLET
12.5000 mg | ORAL_TABLET | Freq: Every day | ORAL | Status: DC
  Administered 2022-02-16: 12.5 mg via ORAL
  Filled 2022-02-16 (×2): qty 1

## 2022-02-16 MED ORDER — FUROSEMIDE 40 MG PO TABS
40.0000 mg | ORAL_TABLET | Freq: Every day | ORAL | Status: DC
Start: 2022-02-16 — End: 2022-02-16

## 2022-02-16 MED ORDER — CEPHALEXIN 500 MG PO CAPS
500.0000 mg | ORAL_CAPSULE | Freq: Two times a day (BID) | ORAL | Status: DC
  Administered 2022-02-16 – 2022-02-18 (×5): 500 mg via ORAL
  Filled 2022-02-16 (×5): qty 1

## 2022-02-16 MED ORDER — HYDRALAZINE HCL 10 MG PO TABS: 10.0000 mg | ORAL_TABLET | Freq: Two times a day (BID) | ORAL | Status: DC

## 2022-02-16 MED ORDER — MIDODRINE HCL 5 MG PO TABS
5.0000 mg | ORAL_TABLET | Freq: Three times a day (TID) | ORAL | Status: DC
  Administered 2022-02-16 (×2): 5 mg via ORAL
  Filled 2022-02-16 (×3): qty 1

## 2022-02-16 MED ORDER — ISOSORBIDE DINITRATE 10 MG PO TABS: 10.0000 mg | ORAL_TABLET | Freq: Two times a day (BID) | ORAL | 5 refills | Status: DC

## 2022-02-16 MED ORDER — CEPHALEXIN 500 MG PO CAPS
500.0000 mg | ORAL_CAPSULE | Freq: Two times a day (BID) | ORAL | 0 refills | Status: DC
Start: 2022-02-16 — End: 2022-02-18

## 2022-02-16 MED ORDER — FUROSEMIDE 40 MG PO TABS
40.0000 mg | ORAL_TABLET | Freq: Every day | ORAL | Status: DC
  Administered 2022-02-16 – 2022-02-18 (×3): 40 mg via ORAL
  Filled 2022-02-16 (×3): qty 1

## 2022-02-16 MED ORDER — ENSURE ENLIVE PO LIQD
237.0000 mL | Freq: Two times a day (BID) | ORAL | Status: DC
  Administered 2022-02-17 (×2): 237 mL via ORAL

## 2022-02-16 MED ORDER — SPIRONOLACTONE 12.5 MG HALF TABLET: 12.5000 mg | ORAL_TABLET | Freq: Every day | ORAL | Status: DC

## 2022-02-16 NOTE — Progress Notes (Signed)
   02/16/22 1400  Assess: MEWS Score  Temp 98.4 F (36.9 C)  BP 96/63  MAP (mmHg) 75  Pulse Rate (!) 109  Resp 18  Level of Consciousness Alert  Assess: MEWS Score  MEWS Temp 0  MEWS Systolic 1  MEWS Pulse 1  MEWS RR 0  MEWS LOC 0  MEWS Score 2  MEWS Score Color Yellow  Assess: SIRS CRITERIA  SIRS Temperature  0  SIRS Pulse 1  SIRS Respirations  0  SIRS WBC 0  SIRS Score Sum  1

## 2022-02-16 NOTE — Progress Notes (Signed)
Tele called and stated pt had a 5 beat run of vtach. MD notified. No new orders at this time.

## 2022-02-16 NOTE — Progress Notes (Addendum)
[facility]                                                                              [name], is a 76 y.o. male, DOB - [birth year], BBC:[id number] Admit date - 02/14/2022    Outpatient Primary MD for the patient is [name], [name], MD  LOS - 1  days  Chief Complaint  Patient presents with   Weakness       Brief summary   Patient is a 76 year old male with history of lung cancer, liver cirrhosis, tobacco use, systolic CHF, EF less than 20%, dementia presented from home with poor p.o. intake.  He has not been eating or drinking for almost last 1 to 2 weeks.  Patient has baseline dementia but able to answer simple questions appropriately.  Patient's sister reported that he had no appetite, no vomiting or diarrhea, no abdominal pain or fevers.  She was concerned that he was getting dehydrated.  When patient stood up he was getting dizzy.  At baseline ambulates with a cane however has some memory problems. Creatinine elevated at 3.08.  BP dropped to 89/50 in ED, UA positive for UTI  Assessment & Plan    Principal Problem:   Acute kidney injury superimposed on chronic kidney disease stage IIIb ([place]) -Likely worsened due to poor p.o. intake in the last 1 to 2 weeks -Baseline creatinine around 1.8 -Presented with creatinine of 3.08, patient was placed on IV fluids, Lasix, Aldactone were held.  -Creatinine improved, back to baseline, 1.5 today  Active Problems: Hypotension -Has history of hypertension, BP in 80s in ED, hydralazine, isosorbide, Lasix, Aldactone, Coreg held -BP still borderline but asymptomatic, no further dizziness. -History of cirrhosis, chronic systolic CHF with EF 50%, resumed Lasix 40 mg daily, Aldactone 12.5 mg daily -Follow BP and renal function -Resume Coreg 3.125 mg twice daily as having reflex tachycardia -Will place on midodrine 5 mg p.o. 3 times daily   UTI (urinary tract infection) -Received IV Rocephin x2 doses, transition to oral  Keflex     Non-small cell carcinoma of left lung, stage 1 ([place]) -Found on lung cancer screening CT.  PET scan showed hypermetabolic lung nodule concerning for primary lung bronchogenic CA.  Felt not to be a surgical candidate due to ischemic cardiomyopathy. -Biopsy showed squamous cell carcinoma, receiving XRT  Dementia -Currently stable, no acute issues -Delirium precaution  Liver cirrhosis (HCC) -Resuming Lasix and Aldactone cautiously, follows GI, Dr Jenetta Downer      Chronic normocytic anemia -Chronic anemia, baseline 9-11 with prior history of GI blood loss, hemorrhagic shock and gastric ulcer -Currently at baseline    Chronic systolic congestive heart failure (HCC) -Stable and appears to be dehydrated at this time.  Last echo 3/23 showed EF of less than 20% with grade 1 DD. -IV fluids discontinued, renal function back to baseline, will cautiously resume Lasix, Aldactone  -Decrease Coreg to 3.125 mg twice daily due to hypotension, but resuming due to reflex tachycardia.  Continue to hold hydralazine, Imdur  -Adding midodrine, follow renal function.  Hyponatremia -Likely due to poor p.o. intake, now improved   Code Status: DNR DVT  Prophylaxis:  enoxaparin (LOVENOX) injection 30 mg Start: 02/14/22 2200   Level of Care: Level of care: Telemetry Family Communication: Discussed with patient's sister, Ms. Bertram Millard Knee on the phone Disposition Plan:      Remains inpatient appropriate: Patient hoping to go home today however borderline BP, tachycardia.  Improving and not dizzy.  Due to liver cirrhosis, chronic systolic CHF, resuming Lasix, Aldactone, low-dose Coreg. If creatinine, BP/HR stable in a.m., will DC home in am.   Procedures:  None  Consultants:   None  Antimicrobials:   Anti-infectives (From admission, onward)    Start     Dose/Rate Route Frequency Ordered Stop   02/16/22 1000  cephALEXin (KEFLEX) capsule 500 mg        500 mg Oral Every 12 hours 02/16/22 0902      02/16/22 0000  cephALEXin (KEFLEX) 500 MG capsule        500 mg Oral 2 times daily 02/16/22 0909 02/21/22 2359   02/14/22 2015  cefTRIAXone (ROCEPHIN) 1 g in sodium chloride 0.9 % 100 mL IVPB  Status:  Discontinued        1 g 200 mL/hr over 30 Minutes Intravenous Every 24 hours 02/14/22 2014 02/16/22 0902          Medications  carvedilol  3.125 mg Oral BID WC   cephALEXin  500 mg Oral Q12H   enoxaparin (LOVENOX) injection  30 mg Subcutaneous Q24H   furosemide  40 mg Oral Daily   midodrine  5 mg Oral TID WC   spironolactone  12.5 mg Oral Daily      Subjective:   [name] was seen and examined today.  Feels better and close to his baseline, tolerating diet.  Wants to go home.  No chest pain or shortness of breath, dizziness.  BP still borderline.  Objective:   Vitals:   02/15/22 2107 02/16/22 0542 02/16/22 1212 02/16/22 1235  BP: (!) 101/59 (!) 92/50 (!) 96/56   Pulse: 88 87 (!) 117   Resp: 17 16 18    Temp: 98.7 F (37.1 C) 99.2 F (37.3 C) 98.3 F (36.8 C)   TempSrc:   Oral   SpO2: 100% 100% 100% 100%  Weight:      Height:        Intake/Output Summary (Last 24 hours) at 02/16/2022 1241 Last data filed at 02/16/2022 1241 Gross per 24 hour  Intake 1700.62 ml  Output 1050 ml  Net 650.62 ml     Wt Readings from Last 3 Encounters:  02/14/22 51.9 kg  01/09/22 56.2 kg  12/26/21 55.5 kg   Physical Exam General: Alert and oriented x 3, pleasant, NAD, close to his baseline Cardiovascular: S1 S2 clear, RRR. Respiratory: CTAB, no wheezing, rales or rhonchi Gastrointestinal: Soft, nontender, nondistended, NBS Ext: no pedal edema bilaterally Neuro: no new deficits Skin: No rashes Psych: has underlying dementia however currently alert and oriented, feels close to his baseline   Data Reviewed:  I have personally reviewed following labs    CBC Lab Results  Component Value Date   WBC 3.6 (L) 02/15/2022   RBC 2.67 (L) 02/15/2022   HGB 9.1 (L) 02/15/2022    HCT 26.3 (L) 02/15/2022   MCV 98.5 02/15/2022   MCH 34.1 (H) 02/15/2022   PLT 109 (L) 02/15/2022   MCHC 34.6 02/15/2022   RDW 14.5 02/15/2022   LYMPHSABS 1.6 02/14/2022   MONOABS 0.2 02/14/2022   EOSABS 0.2 02/14/2022   BASOSABS 0.0 02/14/2022  Last metabolic panel Lab Results  Component Value Date   NA 135 02/16/2022   K 4.4 02/16/2022   CL 110 02/16/2022   CO2 17 (L) 02/16/2022   BUN 23 02/16/2022   CREATININE 1.58 (H) 02/16/2022   GLUCOSE 73 02/16/2022   GFRNONAA 45 (L) 02/16/2022   GFRAA 41 (L) 01/09/2020   CALCIUM 8.3 (L) 02/16/2022   PHOS 2.1 (L) 12/26/2016   PROT 8.8 (H) 02/14/2022   ALBUMIN 2.9 (L) 02/14/2022   LABGLOB 5.0 (H) 09/14/2019   AGRATIO 0.6 (L) 09/14/2019   BILITOT 0.5 02/14/2022   ALKPHOS 51 02/14/2022   AST 24 02/14/2022   ALT 20 02/14/2022   ANIONGAP 8 02/16/2022    CBG (last 3)  Recent Labs    02/14/22 2203 02/16/22 0734  GLUCAP 100* 73      Coagulation Profile: No results for input(s): INR, PROTIME in the last 168 hours.   Radiology Studies: I have personally reviewed the imaging studies  CT Head Wo Contrast  Result Date: 02/14/2022 CLINICAL DATA:  TIA EXAM: CT HEAD WITHOUT CONTRAST TECHNIQUE: Contiguous axial images were obtained from the base of the skull through the vertex without intravenous contrast. RADIATION DOSE REDUCTION: This exam was performed according to the departmental dose-optimization program which includes automated exposure control, adjustment of the mA and/or kV according to patient size and/or use of iterative reconstruction technique. COMPARISON:  12/18/2016 FINDINGS: Brain: No acute intracranial findings are seen in noncontrast CT brain. There are no signs of bleeding. Cortical sulci are prominent. There is decreased density in the periventricular white matter. Vascular: Unremarkable. Skull: Unremarkable. Sinuses/Orbits: Unremarkable. Other: None. IMPRESSION: No acute intracranial findings are seen in noncontrast  CT brain. Atrophy. Small-vessel disease. Electronically Signed   By: Elmer Picker M.D.   On: 02/14/2022 16:57   DG Abdomen Acute W/Chest  Result Date: 02/14/2022 CLINICAL DATA:  Cough, weakness, loss of appetite EXAM: DG ABDOMEN ACUTE WITH 1 VIEW CHEST COMPARISON:  12/19/2021 FINDINGS: Supine and upright frontal views of the abdomen and pelvis as well as an upright frontal view of the chest are obtained. The cardiac silhouette is enlarged but stable. Continued bibasilar interstitial prominence unchanged since prior study. No airspace disease, effusion, or pneumothorax. Bowel gas pattern is unremarkable without obstruction or ileus. There are no masses or abnormal calcifications. No free gas in the greater peritoneal sac. No acute bony abnormalities. IMPRESSION: 1. No bowel obstruction or ileus. 2. Chronic bibasilar pulmonary fibrosis.  No acute airspace disease. Electronically Signed   By: Randa Ngo M.D.   On: 02/14/2022 16:45       Camrin Gearheart M.D. [facility] 02/16/2022, 12:41 PM  Available via Epic secure chat 7am-7pm After 7 pm, please refer to night coverage provider listed on amion.

## 2022-02-16 NOTE — Evaluation (Signed)
Occupational Therapy Evaluation Patient Details Name: [name] MRN: [id number] DOB: [birth year] Today's Date: 02/16/2022   History of Present Illness [name] is a 76 y.o. male with medical history significant for lung cancer, liver cirrhosis, tobacco abuse, systolic CHF, dementia.  Note that he is not eating or drinking for almost 1 to 2 weeks.  Patient has baseline dementia, he is able to answer simple questions appropriately but he does not give any further details.  Patient's Sister [name] is at bedside.  She reports patient has not had any appetite.  Patient denies any vomiting no loose stools, no abdominal pain, no fevers, he has no complaints.  Patient's sister brought him to the hospital as she is concerned patient is dehydrated.  When patient stands up he gets dizzy.  At baseline patient ambulates with a cane, is able to recognize family, answers simple questions, but has some memory problems, and does not know time-the month or year. (Per MD)   Clinical Impression   Pt. Agreeable to OT evaluation. Observed pt. Independently complete functional mobility around room and to and from bathroom. Completed sit to stand t/f and toilet t/f independently. Pt. Completed LBD independently. Per pt. He lives with sister who is around to assist him when needed. Pt. Pt. Has dementia at baseline but was able to report what he had eaten fro breakfast this morning and stated where he lived. Pt. Reports he is at baseline with mobility and ADLS.  Does not require any futher OT treatment in the acute setting and discharge recommendations are below.       Recommendations for follow up therapy are one component of a multi-disciplinary discharge planning process, led by the attending physician.  Recommendations may be updated based on patient status, additional functional criteria and insurance authorization.   Follow Up Recommendations  No OT follow up    Assistance Recommended at  Discharge PRN  Patient can return home with the following Assistance with cooking/housework;Assist for transportation    Functional Status Assessment  Patient has had a recent decline in their functional status and demonstrates the ability to make significant improvements in function in a reasonable and predictable amount of time.  Equipment Recommendations       Recommendations for Other Services       Precautions / Restrictions Precautions Precautions: None Restrictions Weight Bearing Restrictions: No      Mobility Bed Mobility Overal bed mobility: Independent                  Transfers Overall transfer level: Independent                        Balance Overall balance assessment: Independent (completed UE MMT/ROM while in standing- no noted LOB)                                         ADL either performed or assessed with clinical judgement   ADL Overall ADL's : Independent                                             Vision Baseline Vision/History: 0 No visual deficits Ability to See in Adequate Light: 0 Adequate Patient Visual Report: No change from baseline Vision  Assessment?: No apparent visual deficits                Pertinent Vitals/Pain Pain Assessment Pain Assessment: No/denies pain     Hand Dominance Right   Extremity/Trunk Assessment Upper Extremity Assessment Upper Extremity Assessment: Overall WFL for tasks assessed   Lower Extremity Assessment Lower Extremity Assessment: Defer to PT evaluation   Cervical / Trunk Assessment Cervical / Trunk Assessment: Normal   Communication Communication Communication: No difficulties   Cognition Arousal/Alertness: Awake/alert Behavior During Therapy: WFL for tasks assessed/performed Overall Cognitive Status: Within Functional Limits for tasks assessed                                                  Home Living  Family/patient expects to be discharged to:: Private residence Living Arrangements: Other relatives Available Help at Discharge: Family;Available 24 hours/day Type of Home: House Home Access: Level entry     Home Layout: One level     Bathroom Shower/Tub: Teacher, early years/pre: Standard Bathroom Accessibility: Yes   Home Equipment: Cane - single point          Prior Functioning/Environment Prior Level of Function : Independent/Modified Independent             Mobility Comments: Hydrographic surveyor without AD, does not drive ADLs Comments: can completed ADLs independently, does not drive- family assists as needed                      OT Goals(Current goals can be found in the care plan section) Acute Rehab OT Goals Patient Stated Goal: go home OT Goal Formulation: With patient  OT Frequency:                    AM-PAC OT "6 Clicks" Daily Activity     Outcome Measure Help from another person eating meals?: None Help from another person taking care of personal grooming?: None Help from another person toileting, which includes using toliet, bedpan, or urinal?: None Help from another person bathing (including washing, rinsing, drying)?: None Help from another person to put on and taking off regular upper body clothing?: None Help from another person to put on and taking off regular lower body clothing?: None 6 Click Score: 24   End of Session    Activity Tolerance: Patient tolerated treatment well Patient left: in bed;with call bell/phone within reach                   Time: 0930-0938 OT Time Calculation (min): 8 min Charges:  OT General Charges $OT Visit: 1 Visit OT Evaluation $OT Eval Low Complexity: 1 Low Rationale for Evaluation and Treatment Rehabilitation  [name], OTR/L  [name] 02/16/2022, 9:44 AM

## 2022-02-16 NOTE — Care Management Important Message (Signed)
Important Message  Patient Details  Name: [name] MRN: [id number] Date of Birth: 03-Jun-1946   Medicare Important Message Given:  Yes     Tommy Medal 02/16/2022, 1:04 PM

## 2022-02-16 NOTE — Progress Notes (Signed)
   02/16/22 1212  Assess: MEWS Score  Temp 98.3 F (36.8 C)  BP (!) 96/56  MAP (mmHg) 67  Pulse Rate (!) 117  Resp 18  Level of Consciousness Alert  SpO2 100 %  O2 Device Room Air  Assess: MEWS Score  MEWS Temp 0  MEWS Systolic 1  MEWS Pulse 2  MEWS RR 0  MEWS LOC 0  MEWS Score 3  MEWS Score Color Yellow  Assess: SIRS CRITERIA  SIRS Temperature  0  SIRS Pulse 1  SIRS Respirations  0  SIRS WBC 0  SIRS Score Sum  1

## 2022-02-17 DIAGNOSIS — K7460 Unspecified cirrhosis of liver: Secondary | ICD-10-CM

## 2022-02-17 LAB — BASIC METABOLIC PANEL
Anion gap: 7 (ref 5–15)
BUN: 25 mg/dL — ABNORMAL HIGH (ref 8–23)
CO2: 20 mmol/L — ABNORMAL LOW (ref 22–32)
Calcium: 8.8 mg/dL — ABNORMAL LOW (ref 8.9–10.3)
Chloride: 109 mmol/L (ref 98–111)
Creatinine, Ser: 1.65 mg/dL — ABNORMAL HIGH (ref 0.61–1.24)
GFR, Estimated: 43 mL/min — ABNORMAL LOW (ref >60)
Glucose, Bld: 81 mg/dL (ref 70–99)
Potassium: 4.7 mmol/L (ref 3.5–5.1)
Sodium: 136 mmol/L (ref 135–145)

## 2022-02-17 LAB — MAGNESIUM: Magnesium: 2 mg/dL (ref 1.7–2.4)

## 2022-02-17 MED ORDER — MIDODRINE HCL 5 MG PO TABS
10.0000 mg | ORAL_TABLET | Freq: Three times a day (TID) | ORAL | Status: DC
  Administered 2022-02-17 – 2022-02-18 (×4): 10 mg via ORAL
  Filled 2022-02-17 (×4): qty 2

## 2022-02-17 MED ORDER — NICOTINE 14 MG/24HR TD PT24
14.0000 mg | MEDICATED_PATCH | Freq: Every day | TRANSDERMAL | Status: DC
  Administered 2022-02-17 – 2022-02-18 (×2): 14 mg via TRANSDERMAL
  Filled 2022-02-17 (×2): qty 1

## 2022-02-17 NOTE — Progress Notes (Signed)
PROGRESS NOTE  [name] TJQ:[id number] DOB: 08/05/1946 DOA: 02/14/2022 PCP: Celene Squibb, MD  Brief History:    Patient is a 76 year old male with history of lung cancer, liver cirrhosis, tobacco use, systolic CHF, EF less than 20%, dementia presented from home with poor p.o. intake.  He has not been eating or drinking for almost last 1 to 2 weeks.  Patient has baseline dementia but able to answer simple questions appropriately.  Patient's sister reported that he had no appetite, no vomiting or diarrhea, no abdominal pain or fevers.  She was concerned that he was getting dehydrated.  When patient stood up he was getting dizzy.  At baseline ambulates with a cane however has some memory problems. Creatinine elevated at 3.08.  BP dropped to 89/50 in ED, UA positive for UTI  Assessment/Plan:    Assessment and Plan: * Acute kidney injury superimposed on chronic kidney disease (HCC) Baseline creatinine 1.6-1.8 Presented with serum creatinine 3.08 - 1L Normal saline given, continue N/s 75cc/hr x 20hrs - initially held home Lasix, spironolactone - improved  Hypotension -Has history of hypertension, BP in 80s in ED, hydralazine, isosorbide, Lasix, Aldactone, Coreg held initially -BP still borderline but asymptomatic, no further dizziness. -History of cirrhosis, chronic systolic CHF with EF 76%, resumed Lasix 40 mg daily, Aldactone 12.5 mg daily -Follow BP and renal function -Resume Coreg 3.125 mg twice daily as having reflex tachycardia -started on midodrine 5 mg p.o. 3 times daily  UTI (urinary tract infection) UA >50 WBC, rare bacteria.   -He is afebrile without leukocytosis.  Rules out for sepsis.  With his dementia and poor oral intake, will treat. -Follow-up urine cultures>>unrevealing - IV ceftriaxone 1 g daily>>cephalexin  Non-small cell carcinoma of left lung, stage 1 (HCC) Found on lung cancer screening CT, PET scan showed hypermetabolic lung nodule concerning  for primary lung lesion/primary bronchogenic cancer.  Felt not to be surgical candidate due to ischemic cardiomyopathy potassium and other comorbidities.  Underwent fiberoptic bronchoscopy with with biopsies 12/19/2021-confirmed squamous cell carcinoma.  Plan was for radiation treatment which she has started.   Last radiation therapy 01/30/2022.  Cognitive impairment Currently at baseline.  Mostly problems with short-term memory loss.  Lives with his sister.  Chronic systolic congestive heart failure (HCC) Stable and compensated, appears dehydrated.  Last echocardiogram 11/2021, EF of less than 20% with grade 1 diastolic dysfunction. -Cautious with further IV fluids>>stopped -Hold Imdur, hydralazine, carvedilol with hypotension  Anemia Anemia of Chronic disease Hemoglobin 9.1,  recent baseline 10-11.  History of GI blood loss, hemorrhagic shock, gastric ulcer. Currently at baseline  Liver cirrhosis (HCC) Appears stable.  Baseline dementia mental status is intact.  Platelets 114. -Hold Lasix and spironolactone initially - resumed lasix        Family Communication:   sister updated 6/3  Consultants:  none  Code Status:  DNR  DVT Prophylaxis: [place] Lovenox   Procedures: As Listed in Progress Note Above  Antibiotics: None     Subjective: Patient denies fevers, chills, headache, chest pain, dyspnea, nausea, vomiting, diarrhea, abdominal pain, dysuria, hematuria, hematochezia, and melena.   Objective: Vitals:   02/17/22 0440 02/17/22 0450 02/17/22 0800 02/17/22 1600  BP: (!) 79/46 (!) 88/38 91/60 101/72  Pulse: 89  97 61  Resp:      Temp: 98.2 F (36.8 C)     TempSrc: Oral     SpO2: 100%     Weight:  Height:        Intake/Output Summary (Last 24 hours) at 02/17/2022 1647 Last data filed at 02/17/2022 1300 Gross per 24 hour  Intake 960 ml  Output 600 ml  Net 360 ml   Weight change:  Exam:  General:  Pt is alert, follows commands appropriately, not in acute  distress HEENT: No icterus, No thrush, No neck mass, [place]/AT Cardiovascular: RRR, S1/S2, no rubs, no gallops Respiratory: CTA bilaterally, no wheezing, no crackles, no rhonchi Abdomen: Soft/+BS, non tender, non distended, no guarding Extremities: No edema, No lymphangitis, No petechiae, No rashes, no synovitis   Data Reviewed: I have personally reviewed following labs and imaging studies Basic Metabolic Panel: Recent Labs  Lab 02/14/22 1616 02/15/22 0424 02/16/22 0451 02/17/22 0527 02/17/22 0616  NA 131* 133* 135 136  --   K 4.5 5.0 4.4 4.7  --   CL 105 107 110 109  --   CO2 20* 18* 17* 20*  --   GLUCOSE 95 83 73 81  --   BUN 30* 27* 23 25*  --   CREATININE 3.08* 2.20* 1.58* 1.65*  --   CALCIUM 8.6* 8.5* 8.3* 8.8*  --   MG 2.4  --   --   --  2.0   Liver Function Tests: Recent Labs  Lab 02/14/22 1616  AST 24  ALT 20  ALKPHOS 51  BILITOT 0.5  PROT 8.8*  ALBUMIN 2.9*   Recent Labs  Lab 02/14/22 1621  LIPASE 52*   No results for input(s): AMMONIA in the last 168 hours. Coagulation Profile: No results for input(s): INR, PROTIME in the last 168 hours. CBC: Recent Labs  Lab 02/14/22 1616 02/15/22 0424  WBC 3.5* 3.6*  NEUTROABS 1.5*  --   HGB 9.1* 9.1*  HCT 25.3* 26.3*  MCV 96.2 98.5  PLT 114* 109*   Cardiac Enzymes: No results for input(s): CKTOTAL, CKMB, CKMBINDEX, TROPONINI in the last 168 hours. BNP: Invalid input(s): POCBNP CBG: Recent Labs  Lab 02/14/22 2203 02/16/22 0734  GLUCAP 100* 73   HbA1C: No results for input(s): HGBA1C in the last 72 hours. Urine analysis:    Component Value Date/Time   COLORURINE YELLOW 02/14/2022 1836   APPEARANCEUR HAZY (A) 02/14/2022 1836   LABSPEC 1.006 02/14/2022 1836   PHURINE 5.0 02/14/2022 1836   GLUCOSEU NEGATIVE 02/14/2022 1836   HGBUR NEGATIVE 02/14/2022 1836   BILIRUBINUR NEGATIVE 02/14/2022 1836   KETONESUR NEGATIVE 02/14/2022 1836   PROTEINUR NEGATIVE 02/14/2022 1836   NITRITE NEGATIVE 02/14/2022  1836   LEUKOCYTESUR LARGE (A) 02/14/2022 1836   Sepsis Labs: @LABRCNTIP (procalcitonin:4,lacticidven:4) ) Recent Results (from the past 240 hour(s))  Urine Culture     Status: Abnormal   Collection Time: 02/14/22  8:14 PM   Specimen: Urine, Clean Catch  Result Value Ref Range Status   Specimen Description   Final    URINE, CLEAN CATCH Performed at [facility], [address] [place], [place] [address]    Special Requests   Final    NONE Performed at [facility], [address] [place], [place] [address]    Culture (A)  Final    <10,000 COLONIES/mL INSIGNIFICANT GROWTH Performed at [facility], [place] [address] [place], [place] [address]    Report Status 02/16/2022 FINAL  Final     Scheduled Meds:  carvedilol  3.125 mg Oral BID WC   cephALEXin  500 mg Oral Q12H   enoxaparin (LOVENOX) injection  30 mg Subcutaneous Q24H  feeding supplement  237 mL Oral BID BM   furosemide  40 mg Oral Daily   midodrine  10 mg Oral TID WC   nicotine  14 mg Transdermal Daily   Continuous Infusions:  Procedures/Studies: CT Head Wo Contrast  Result Date: 02/14/2022 CLINICAL DATA:  TIA EXAM: CT HEAD WITHOUT CONTRAST TECHNIQUE: Contiguous axial images were obtained from the base of the skull through the vertex without intravenous contrast. RADIATION DOSE REDUCTION: This exam was performed according to the departmental dose-optimization program which includes automated exposure control, adjustment of the mA and/or kV according to patient size and/or use of iterative reconstruction technique. COMPARISON:  12/18/2016 FINDINGS: Brain: No acute intracranial findings are seen in noncontrast CT brain. There are no signs of bleeding. Cortical sulci are prominent. There is decreased density in the periventricular white matter. Vascular: Unremarkable. Skull: Unremarkable. Sinuses/Orbits: Unremarkable. Other: None. IMPRESSION: No acute intracranial findings are seen in noncontrast CT brain. Atrophy.  Small-vessel disease. Electronically Signed   By: Elmer Picker M.D.   On: 02/14/2022 16:57   DG Abdomen Acute W/Chest  Result Date: 02/14/2022 CLINICAL DATA:  Cough, weakness, loss of appetite EXAM: DG ABDOMEN ACUTE WITH 1 VIEW CHEST COMPARISON:  12/19/2021 FINDINGS: Supine and upright frontal views of the abdomen and pelvis as well as an upright frontal view of the chest are obtained. The cardiac silhouette is enlarged but stable. Continued bibasilar interstitial prominence unchanged since prior study. No airspace disease, effusion, or pneumothorax. Bowel gas pattern is unremarkable without obstruction or ileus. There are no masses or abnormal calcifications. No free gas in the greater peritoneal sac. No acute bony abnormalities. IMPRESSION: 1. No bowel obstruction or ileus. 2. Chronic bibasilar pulmonary fibrosis.  No acute airspace disease. Electronically Signed   By: Randa Ngo M.D.   On: 02/14/2022 16:45    Orson Eva, DO  [facility]  If 7PM-7AM, please contact night-coverage [email or web address] Password [email or web address] 02/17/2022, 4:47 PM   LOS: 2 days

## 2022-02-17 NOTE — Progress Notes (Signed)
Pt. manual BP 88/38. HR 56. He is not symptomatic. MD Notified, RN notified.

## 2022-02-17 NOTE — Progress Notes (Signed)
Patient encouraged to eat his meals. He has had poor appetite. BP meds held this am due to low BP per Dr. Carles Collet, meds adjusted. Patient educated and expressed understanding. Has been pleasant, alert, and oriented. Call bell within reach.

## 2022-02-17 NOTE — Assessment & Plan Note (Addendum)
-  Has history of hypertension, BP in 80s in ED, hydralazine, isosorbide, Lasix, Aldactone, Coreg held initially -BP still borderline but asymptomatic, no further dizziness. -History of cirrhosis, chronic systolic CHF with EF 10%, resumed Lasix 40 mg daily -Resume Coreg 3.125 mg twice daily (oreviously 6.25 mg bid) -d/c spironolactone due to low BPs -started on midodrine 5 mg p.o. 3 times daily>>increased to 10 mg tid -d/c hydralazine and isosorbid due to low BPs

## 2022-02-18 DIAGNOSIS — I950 Idiopathic hypotension: Secondary | ICD-10-CM

## 2022-02-18 MED ORDER — MIDODRINE HCL 10 MG PO TABS: 10.0000 mg | ORAL_TABLET | Freq: Three times a day (TID) | ORAL | 1 refills | Status: AC

## 2022-02-18 MED ORDER — CARVEDILOL 3.125 MG PO TABS: 3.1250 mg | ORAL_TABLET | Freq: Two times a day (BID) | ORAL | 1 refills | Status: DC

## 2022-02-18 NOTE — Discharge Summary (Signed)
Physician Discharge Summary   Patient: [name] MRN: [id number] DOB: January 02, 1946  Admit date:     02/14/2022  Discharge date: 02/18/22  Discharge Physician: Shanon Brow Erin Uecker   PCP: Celene Squibb, MD   Recommendations at discharge:  / Please follow up with primary care provider within 1-2 weeks  Please repeat BMP and CBC in one week Follow up with cardiology, Dr. Domenic Polite on 03/19/22    Hospital Course:  Patient is a 76 year old male with history of lung cancer, liver cirrhosis, tobacco use, systolic CHF, EF less than 20%, dementia presented from home with poor p.o. intake.  He has not been eating or drinking for almost last 1 to 2 weeks.  Patient has baseline dementia but able to answer simple questions appropriately.  Patient's sister reported that he had no appetite, no vomiting or diarrhea, no abdominal pain or fevers.  She was concerned that he was getting dehydrated.  When patient stood up he was getting dizzy.  At baseline ambulates with a cane however has some memory problems. Creatinine elevated at 3.08.  BP dropped to 89/50 in ED, UA positive for UTI Assessment and Plan: * Acute kidney injury superimposed on chronic kidney disease (HCC) Baseline creatinine 1.6-1.8 Presented with serum creatinine 3.08 - 1L Normal saline given, continue N/s 75cc/hr x 20hrs - initially held home Lasix, spironolactone - serum creatinine 1.65 at time of d/c  Hypotension -Has history of hypertension, BP in 80s in ED, hydralazine, isosorbide, Lasix, Aldactone, Coreg held initially -BP still borderline but asymptomatic, no further dizziness. -History of cirrhosis, chronic systolic CHF with EF 37%, resumed Lasix 40 mg daily -Resume Coreg 3.125 mg twice daily (oreviously 6.25 mg bid) -d/c spironolactone due to low BPs -started on midodrine 5 mg p.o. 3 times daily>>increased to 10 mg tid -d/c hydralazine and isosorbid due to low BPs  UTI (urinary tract infection) UA >50 WBC, rare bacteria.   -He is  afebrile without leukocytosis.  Rules out for sepsis.  With his dementia and poor oral intake, will treat. -Follow-up urine cultures>>unrevealing - IV ceftriaxone 1 g daily>>cephalexin>>finished 5 days antibiotics during hospitalization  Non-small cell carcinoma of left lung, stage 1 (HCC) Found on lung cancer screening CT, PET scan showed hypermetabolic lung nodule concerning for primary lung lesion/primary bronchogenic cancer.  Felt not to be surgical candidate due to ischemic cardiomyopathy potassium and other comorbidities.  Underwent fiberoptic bronchoscopy with with biopsies 12/19/2021-confirmed squamous cell carcinoma.  Plan was for radiation treatment which she has started.   Last radiation therapy 01/30/2022.  Cognitive impairment Currently at baseline.  Mostly problems with short-term memory loss.  Lives with his sister.  Chronic systolic congestive heart failure (HCC) Stable and compensated, appears dehydrated.  Last echocardiogram 11/2021, EF of less than 20% with grade 1 diastolic dysfunction. -Cautious with further IV fluids>>stopped -Hold Imdur, hydralazine, carvedilol with hypotension  Anemia Anemia of Chronic disease recent baseline 9.  History of GI blood loss, hemorrhagic shock, gastric ulcer. No evidence for active blood loss currently  Liver cirrhosis (HCC) Appears stable.  Baseline dementia mental status is intact.  Platelets 114. -Hold Lasix and spironolactone initially - resumed lasix         Consultants: none Procedures performed: none  Disposition: Home Diet recommendation:  Discharge Diet Orders (From admission, onward)     Start     Ordered   02/16/22 0000  Diet - low sodium heart healthy        02/16/22 0909  Cardiac diet DISCHARGE MEDICATION: Allergies as of 02/18/2022   No Known Allergies      Medication List     STOP taking these medications    hydrALAZINE 10 MG tablet Commonly known as: APRESOLINE   isosorbide  dinitrate 10 MG tablet Commonly known as: ISORDIL   spironolactone 25 MG tablet Commonly known as: ALDACTONE       TAKE these medications    allopurinol 100 MG tablet Commonly known as: ZYLOPRIM Take 100 mg by mouth daily.   atorvastatin 20 MG tablet Commonly known as: LIPITOR Take 20 mg by mouth daily.   carvedilol 3.125 MG tablet Commonly known as: COREG Take 1 tablet (3.125 mg total) by mouth 2 (two) times daily with a meal. What changed:  medication strength how much to take   folic acid 1 MG tablet Commonly known as: FOLVITE Take 1 tablet (1 mg total) by mouth daily.   furosemide 40 MG tablet Commonly known as: LASIX Take 40 mg by mouth daily.   magnesium oxide 400 (240 Mg) MG tablet Commonly known as: MAG-OX Take 1 tablet (400 mg total) by mouth daily.   megestrol 40 MG tablet Commonly known as: MEGACE 40 mg 2 (two) times daily.   midodrine 10 MG tablet Commonly known as: PROAMATINE Take 1 tablet (10 mg total) by mouth 3 (three) times daily with meals.   multivitamin with minerals tablet Take 1 tablet by mouth daily.   nitroGLYCERIN 0.4 MG SL tablet Commonly known as: NITROSTAT Place 0.4 mg under the tongue every 5 (five) minutes as needed for chest pain.   pantoprazole 40 MG tablet Commonly known as: PROTONIX TAKE 1 TABLET BY MOUTH ONCE DAILY BEFORE BREAKFAST.   potassium chloride 10 MEQ tablet Commonly known as: KLOR-CON M Take 1 tablet (10 mEq total) by mouth daily.   senna-docusate 8.6-50 MG tablet Commonly known as: Senokot-S Take 1 tablet by mouth daily.   vitamin B-12 100 MCG tablet Commonly known as: CYANOCOBALAMIN Take 100 mcg by mouth daily.        Discharge Exam: Filed Weights   02/14/22 1541 02/14/22 2028  Weight: 51.7 kg 51.9 kg   HEENT:  [place]/AT, No thrush, no icterus CV:  RRR, no rub, no S3, no S4 Lung:  CTA, no wheeze, no rhonchi Abd:  soft/+BS, NT Ext:  No edema, no lymphangitis, no synovitis, no  rash   Condition at discharge: stable  The results of significant diagnostics from this hospitalization (including imaging, microbiology, ancillary and laboratory) are listed below for reference.   Imaging Studies: CT Head Wo Contrast  Result Date: 02/14/2022 CLINICAL DATA:  TIA EXAM: CT HEAD WITHOUT CONTRAST TECHNIQUE: Contiguous axial images were obtained from the base of the skull through the vertex without intravenous contrast. RADIATION DOSE REDUCTION: This exam was performed according to the departmental dose-optimization program which includes automated exposure control, adjustment of the mA and/or kV according to patient size and/or use of iterative reconstruction technique. COMPARISON:  12/18/2016 FINDINGS: Brain: No acute intracranial findings are seen in noncontrast CT brain. There are no signs of bleeding. Cortical sulci are prominent. There is decreased density in the periventricular white matter. Vascular: Unremarkable. Skull: Unremarkable. Sinuses/Orbits: Unremarkable. Other: None. IMPRESSION: No acute intracranial findings are seen in noncontrast CT brain. Atrophy. Small-vessel disease. Electronically Signed   By: Elmer Picker M.D.   On: 02/14/2022 16:57   DG Abdomen Acute W/Chest  Result Date: 02/14/2022 CLINICAL DATA:  Cough, weakness, loss of appetite EXAM: DG ABDOMEN ACUTE  WITH 1 VIEW CHEST COMPARISON:  12/19/2021 FINDINGS: Supine and upright frontal views of the abdomen and pelvis as well as an upright frontal view of the chest are obtained. The cardiac silhouette is enlarged but stable. Continued bibasilar interstitial prominence unchanged since prior study. No airspace disease, effusion, or pneumothorax. Bowel gas pattern is unremarkable without obstruction or ileus. There are no masses or abnormal calcifications. No free gas in the greater peritoneal sac. No acute bony abnormalities. IMPRESSION: 1. No bowel obstruction or ileus. 2. Chronic bibasilar pulmonary fibrosis.   No acute airspace disease. Electronically Signed   By: Randa Ngo M.D.   On: 02/14/2022 16:45    Microbiology: Results for orders placed or performed during the hospital encounter of 02/14/22  Urine Culture     Status: Abnormal   Collection Time: 02/14/22  8:14 PM   Specimen: Urine, Clean Catch  Result Value Ref Range Status   Specimen Description   Final    URINE, CLEAN CATCH Performed at [facility], [address], [place], [place] [address]    Special Requests   Final    NONE Performed at [facility], [address] [place], [place] [address]    Culture (A)  Final    <10,000 COLONIES/mL INSIGNIFICANT GROWTH Performed at [facility] [address] [place], [place] [address]    Report Status 02/16/2022 FINAL  Final    Labs: CBC: Recent Labs  Lab 02/14/22 1616 02/15/22 0424  WBC 3.5* 3.6*  NEUTROABS 1.5*  --   HGB 9.1* 9.1*  HCT 25.3* 26.3*  MCV 96.2 98.5  PLT 114* 938*   Basic Metabolic Panel: Recent Labs  Lab 02/14/22 1616 02/15/22 0424 02/16/22 0451 02/17/22 0527 02/17/22 0616  NA 131* 133* 135 136  --   K 4.5 5.0 4.4 4.7  --   CL 105 107 110 109  --   CO2 20* 18* 17* 20*  --   GLUCOSE 95 83 73 81  --   BUN 30* 27* 23 25*  --   CREATININE 3.08* 2.20* 1.58* 1.65*  --   CALCIUM 8.6* 8.5* 8.3* 8.8*  --   MG 2.4  --   --   --  2.0   Liver Function Tests: Recent Labs  Lab 02/14/22 1616  AST 24  ALT 20  ALKPHOS 51  BILITOT 0.5  PROT 8.8*  ALBUMIN 2.9*   CBG: Recent Labs  Lab 02/14/22 2203 02/16/22 0734  GLUCAP 100* 73    Discharge time spent: greater than 30 minutes.  Signed: Orson Eva, MD [facility] 02/18/2022

## 2022-02-18 NOTE — Progress Notes (Signed)
Nsg Discharge Note  Admit Date:  02/14/2022 Discharge date: 02/18/2022   [name] to be D/C'd Home per MD order.  AVS completed. Patient/caregiver able to verbalize understanding.  Discharge Medication: Allergies as of 02/18/2022   No Known Allergies      Medication List     STOP taking these medications    hydrALAZINE 10 MG tablet Commonly known as: APRESOLINE   isosorbide dinitrate 10 MG tablet Commonly known as: ISORDIL   spironolactone 25 MG tablet Commonly known as: ALDACTONE       TAKE these medications    allopurinol 100 MG tablet Commonly known as: ZYLOPRIM Take 100 mg by mouth daily.   atorvastatin 20 MG tablet Commonly known as: LIPITOR Take 20 mg by mouth daily.   carvedilol 3.125 MG tablet Commonly known as: COREG Take 1 tablet (3.125 mg total) by mouth 2 (two) times daily with a meal. What changed:  medication strength how much to take   folic acid 1 MG tablet Commonly known as: FOLVITE Take 1 tablet (1 mg total) by mouth daily.   furosemide 40 MG tablet Commonly known as: LASIX Take 40 mg by mouth daily.   magnesium oxide 400 (240 Mg) MG tablet Commonly known as: MAG-OX Take 1 tablet (400 mg total) by mouth daily.   megestrol 40 MG tablet Commonly known as: MEGACE 40 mg 2 (two) times daily.   midodrine 10 MG tablet Commonly known as: PROAMATINE Take 1 tablet (10 mg total) by mouth 3 (three) times daily with meals.   multivitamin with minerals tablet Take 1 tablet by mouth daily.   nitroGLYCERIN 0.4 MG SL tablet Commonly known as: NITROSTAT Place 0.4 mg under the tongue every 5 (five) minutes as needed for chest pain.   pantoprazole 40 MG tablet Commonly known as: PROTONIX TAKE 1 TABLET BY MOUTH ONCE DAILY BEFORE BREAKFAST.   potassium chloride 10 MEQ tablet Commonly known as: KLOR-CON M Take 1 tablet (10 mEq total) by mouth daily.   senna-docusate 8.6-50 MG tablet Commonly known as: Senokot-S Take 1 tablet by  mouth daily.   vitamin B-12 100 MCG tablet Commonly known as: CYANOCOBALAMIN Take 100 mcg by mouth daily.        Discharge Assessment: Vitals:   02/17/22 2130 02/18/22 0622  BP: (!) 92/54 (!) 88/56  Pulse: 80 77  Resp: 18 19  Temp: 98.5 F (36.9 C) 97.6 F (36.4 C)  SpO2: 100% 100%   Skin clean, dry and intact without evidence of skin break down, no evidence of skin tears noted. IV catheter discontinued intact. Site without signs and symptoms of complications - no redness or edema noted at insertion site, patient denies c/o pain - only slight tenderness at site.  Dressing with slight pressure applied.  D/c Instructions-Education: Discharge instructions given to patient/family with verbalized understanding. D/c education completed with patient/family including follow up instructions, medication list, d/c activities limitations if indicated, with other d/c instructions as indicated by MD - patient able to verbalize understanding, all questions fully answered. Patient instructed to return to ED, call 911, or call MD for any changes in condition.  Patient escorted via [place], and D/C home via private auto.  Alfonse Alpers, RN 02/18/2022 11:19 AM

## 2022-02-20 ENCOUNTER — Telehealth: Payer: Self-pay | Admitting: *Deleted

## 2022-02-20 NOTE — Telephone Encounter (Signed)
Patient's sister, Kourtney Montesinos,  returned call to this RNCM at 11:30 am and left message requesting return call. Successful telephone outreach to patient's sister Blanch Media; obtained verbal permission from patient to speak with his sister Ottavio Norem in order to complete transition of care assessment.    Transition Care Management Follow-up Telephone Call Date of discharge and from where: 02/18/22 [facility] How have you been since you were released from the hospital? "Still just eating and drinking a little, he really doesn't have an appetite, I think it's because his teeth hurt. He is going to the dentist tomorrow to have his teeth pulled."" Any questions or concerns? No  Items Reviewed: Did the pt receive and understand the discharge instructions provided? Yes  Medications obtained and verified? Yes  Other? No  Any new allergies since your discharge? No  Dietary orders reviewed? Yes Do you have support at home? Yes   Home Care and Equipment/Supplies: Were home health services ordered? no If so, what is the name of the agency? Not applicable  Has the agency set up a time to come to the patient's home? not applicable Were any new equipment or medical supplies ordered?  No What is the name of the medical supply agency? Not applicable Were you able to get the supplies/equipment? not applicable Do you have any questions related to the use of the equipment or supplies? Not applicable  Functional Questionnaire: (I = Independent and D = Dependent) ADLs: assist from sister  Bathing/Dressing- Assist from sister  Meal Prep- Sister prepares all meals   Eating- I  Maintaining continence- I  Transferring/Ambulation- I, with cane  Managing Meds- Sister  Follow up appointments reviewed:  PCP Hospital f/u appt confirmed? No  , at RNCM's direction, she will call Dr. Juel Burrow office to schedule follow up  appointment in the next 1-2 weeks [facility] f/u appt confirmed? Yes   Scheduled to see Dr. Domenic Polite on 03/19/22 @ 3:40 pm. Are transportation arrangements needed? No  If their condition worsens, is the pt aware to call PCP or go to the Emergency Dept.? Yes Was the patient provided with contact information for the PCP's office or ED? Yes Was to pt encouraged to call back with questions or concerns? Yes    Kelli Churn RN, CCM, [facility] Care Management Coordinator  [phone]

## 2022-02-20 NOTE — Telephone Encounter (Signed)
Transition Care Management Unsuccessful Follow-up Telephone Call  Date of discharge and from where:  02/18/22 [facility]  Attempts:  1st Attempt  Reason for unsuccessful TCM follow-up call:  Left voice message   Kelli Churn RN, CCM, [place] Management Coordinator [phone]

## 2022-02-21 ENCOUNTER — Other Ambulatory Visit: Payer: Self-pay

## 2022-02-21 ENCOUNTER — Encounter (HOSPITAL_COMMUNITY): Payer: Self-pay | Admitting: Emergency Medicine

## 2022-02-21 DIAGNOSIS — Z681 Body mass index (BMI) 19 or less, adult: Secondary | ICD-10-CM | POA: Diagnosis not present

## 2022-02-21 DIAGNOSIS — Z79899 Other long term (current) drug therapy: Secondary | ICD-10-CM | POA: Diagnosis not present

## 2022-02-21 DIAGNOSIS — K0889 Other specified disorders of teeth and supporting structures: Secondary | ICD-10-CM | POA: Insufficient documentation

## 2022-02-21 DIAGNOSIS — N179 Acute kidney failure, unspecified: Secondary | ICD-10-CM | POA: Diagnosis not present

## 2022-02-21 DIAGNOSIS — C3492 Malignant neoplasm of unspecified part of left bronchus or lung: Secondary | ICD-10-CM | POA: Diagnosis not present

## 2022-02-21 DIAGNOSIS — R627 Adult failure to thrive: Secondary | ICD-10-CM | POA: Diagnosis not present

## 2022-02-21 DIAGNOSIS — E860 Dehydration: Secondary | ICD-10-CM | POA: Diagnosis not present

## 2022-02-21 DIAGNOSIS — E440 Moderate protein-calorie malnutrition: Secondary | ICD-10-CM | POA: Diagnosis not present

## 2022-02-21 DIAGNOSIS — I959 Hypotension, unspecified: Secondary | ICD-10-CM | POA: Diagnosis not present

## 2022-02-21 DIAGNOSIS — D696 Thrombocytopenia, unspecified: Secondary | ICD-10-CM | POA: Diagnosis not present

## 2022-02-21 DIAGNOSIS — J9601 Acute respiratory failure with hypoxia: Secondary | ICD-10-CM | POA: Diagnosis not present

## 2022-02-21 DIAGNOSIS — E871 Hypo-osmolality and hyponatremia: Secondary | ICD-10-CM | POA: Diagnosis not present

## 2022-02-21 DIAGNOSIS — R000 Tachycardia, unspecified: Secondary | ICD-10-CM | POA: Diagnosis not present

## 2022-02-21 DIAGNOSIS — I255 Ischemic cardiomyopathy: Secondary | ICD-10-CM | POA: Diagnosis not present

## 2022-02-21 DIAGNOSIS — E869 Volume depletion, unspecified: Secondary | ICD-10-CM | POA: Diagnosis not present

## 2022-02-21 DIAGNOSIS — Z5321 Procedure and treatment not carried out due to patient leaving prior to being seen by health care provider: Secondary | ICD-10-CM | POA: Insufficient documentation

## 2022-02-21 DIAGNOSIS — E43 Unspecified severe protein-calorie malnutrition: Secondary | ICD-10-CM | POA: Diagnosis not present

## 2022-02-21 DIAGNOSIS — D631 Anemia in chronic kidney disease: Secondary | ICD-10-CM | POA: Diagnosis not present

## 2022-02-21 DIAGNOSIS — R42 Dizziness and giddiness: Secondary | ICD-10-CM | POA: Diagnosis not present

## 2022-02-21 DIAGNOSIS — Z66 Do not resuscitate: Secondary | ICD-10-CM | POA: Diagnosis not present

## 2022-02-21 DIAGNOSIS — R350 Frequency of micturition: Secondary | ICD-10-CM | POA: Diagnosis not present

## 2022-02-21 DIAGNOSIS — R64 Cachexia: Secondary | ICD-10-CM | POA: Diagnosis not present

## 2022-02-21 DIAGNOSIS — I5042 Chronic combined systolic (congestive) and diastolic (congestive) heart failure: Secondary | ICD-10-CM | POA: Diagnosis not present

## 2022-02-21 DIAGNOSIS — I130 Hypertensive heart and chronic kidney disease with heart failure and stage 1 through stage 4 chronic kidney disease, or unspecified chronic kidney disease: Secondary | ICD-10-CM | POA: Diagnosis not present

## 2022-02-21 DIAGNOSIS — R9431 Abnormal electrocardiogram [ECG] [EKG]: Secondary | ICD-10-CM | POA: Diagnosis not present

## 2022-02-21 DIAGNOSIS — R339 Retention of urine, unspecified: Secondary | ICD-10-CM | POA: Diagnosis not present

## 2022-02-21 DIAGNOSIS — Z515 Encounter for palliative care: Secondary | ICD-10-CM | POA: Diagnosis not present

## 2022-02-21 DIAGNOSIS — R570 Cardiogenic shock: Secondary | ICD-10-CM | POA: Diagnosis not present

## 2022-02-21 DIAGNOSIS — N184 Chronic kidney disease, stage 4 (severe): Secondary | ICD-10-CM | POA: Diagnosis not present

## 2022-02-21 DIAGNOSIS — K7460 Unspecified cirrhosis of liver: Secondary | ICD-10-CM | POA: Diagnosis not present

## 2022-02-21 DIAGNOSIS — I950 Idiopathic hypotension: Secondary | ICD-10-CM | POA: Diagnosis not present

## 2022-02-21 DIAGNOSIS — R634 Abnormal weight loss: Secondary | ICD-10-CM | POA: Diagnosis not present

## 2022-02-21 NOTE — ED Triage Notes (Signed)
Pt had 17 teeth pulled today, sister reports pt has been unable to eat and drink or take meds since

## 2022-02-22 ENCOUNTER — Inpatient Hospital Stay (HOSPITAL_COMMUNITY)
Admission: EM | Admit: 2022-02-22 | Discharge: 2022-03-17 | DRG: 682 | Disposition: E | Payer: Medicare Other | Attending: Family Medicine | Admitting: Family Medicine

## 2022-02-22 ENCOUNTER — Emergency Department (HOSPITAL_COMMUNITY)
Admission: EM | Admit: 2022-02-22 | Discharge: 2022-02-22 | Discharge status: Against Medical Advice | Payer: Medicare Other | Source: Home / Self Care

## 2022-02-22 ENCOUNTER — Encounter (HOSPITAL_COMMUNITY): Payer: Self-pay | Admitting: Emergency Medicine

## 2022-02-22 DIAGNOSIS — R627 Adult failure to thrive: Secondary | ICD-10-CM | POA: Diagnosis not present

## 2022-02-22 DIAGNOSIS — R911 Solitary pulmonary nodule: Secondary | ICD-10-CM | POA: Diagnosis present

## 2022-02-22 DIAGNOSIS — R680 Hypothermia, not associated with low environmental temperature: Secondary | ICD-10-CM | POA: Diagnosis present

## 2022-02-22 DIAGNOSIS — F419 Anxiety disorder, unspecified: Secondary | ICD-10-CM | POA: Diagnosis present

## 2022-02-22 DIAGNOSIS — R339 Retention of urine, unspecified: Secondary | ICD-10-CM | POA: Diagnosis not present

## 2022-02-22 DIAGNOSIS — I5042 Chronic combined systolic (congestive) and diastolic (congestive) heart failure: Secondary | ICD-10-CM | POA: Diagnosis present

## 2022-02-22 DIAGNOSIS — F0390 Unspecified dementia without behavioral disturbance: Secondary | ICD-10-CM | POA: Diagnosis present

## 2022-02-22 DIAGNOSIS — Z515 Encounter for palliative care: Secondary | ICD-10-CM

## 2022-02-22 DIAGNOSIS — C3492 Malignant neoplasm of unspecified part of left bronchus or lung: Secondary | ICD-10-CM | POA: Diagnosis present

## 2022-02-22 DIAGNOSIS — R634 Abnormal weight loss: Secondary | ICD-10-CM | POA: Diagnosis present

## 2022-02-22 DIAGNOSIS — Z66 Do not resuscitate: Secondary | ICD-10-CM | POA: Diagnosis present

## 2022-02-22 DIAGNOSIS — N179 Acute kidney failure, unspecified: Secondary | ICD-10-CM | POA: Diagnosis not present

## 2022-02-22 DIAGNOSIS — D631 Anemia in chronic kidney disease: Secondary | ICD-10-CM | POA: Diagnosis present

## 2022-02-22 DIAGNOSIS — R4189 Other symptoms and signs involving cognitive functions and awareness: Secondary | ICD-10-CM | POA: Diagnosis present

## 2022-02-22 DIAGNOSIS — Z923 Personal history of irradiation: Secondary | ICD-10-CM

## 2022-02-22 DIAGNOSIS — I2510 Atherosclerotic heart disease of native coronary artery without angina pectoris: Secondary | ICD-10-CM | POA: Diagnosis present

## 2022-02-22 DIAGNOSIS — Z79899 Other long term (current) drug therapy: Secondary | ICD-10-CM

## 2022-02-22 DIAGNOSIS — R64 Cachexia: Secondary | ICD-10-CM | POA: Diagnosis present

## 2022-02-22 DIAGNOSIS — I447 Left bundle-branch block, unspecified: Secondary | ICD-10-CM | POA: Diagnosis present

## 2022-02-22 DIAGNOSIS — E43 Unspecified severe protein-calorie malnutrition: Secondary | ICD-10-CM | POA: Diagnosis present

## 2022-02-22 DIAGNOSIS — K0889 Other specified disorders of teeth and supporting structures: Secondary | ICD-10-CM | POA: Diagnosis present

## 2022-02-22 DIAGNOSIS — Z681 Body mass index (BMI) 19 or less, adult: Secondary | ICD-10-CM

## 2022-02-22 DIAGNOSIS — J9691 Respiratory failure, unspecified with hypoxia: Secondary | ICD-10-CM | POA: Diagnosis present

## 2022-02-22 DIAGNOSIS — K219 Gastro-esophageal reflux disease without esophagitis: Secondary | ICD-10-CM | POA: Diagnosis present

## 2022-02-22 DIAGNOSIS — E869 Volume depletion, unspecified: Secondary | ICD-10-CM | POA: Diagnosis not present

## 2022-02-22 DIAGNOSIS — R54 Age-related physical debility: Secondary | ICD-10-CM | POA: Diagnosis present

## 2022-02-22 DIAGNOSIS — I950 Idiopathic hypotension: Secondary | ICD-10-CM

## 2022-02-22 DIAGNOSIS — R000 Tachycardia, unspecified: Secondary | ICD-10-CM | POA: Diagnosis not present

## 2022-02-22 DIAGNOSIS — K7460 Unspecified cirrhosis of liver: Secondary | ICD-10-CM | POA: Diagnosis present

## 2022-02-22 DIAGNOSIS — I130 Hypertensive heart and chronic kidney disease with heart failure and stage 1 through stage 4 chronic kidney disease, or unspecified chronic kidney disease: Secondary | ICD-10-CM | POA: Diagnosis present

## 2022-02-22 DIAGNOSIS — E440 Moderate protein-calorie malnutrition: Secondary | ICD-10-CM | POA: Diagnosis present

## 2022-02-22 DIAGNOSIS — N184 Chronic kidney disease, stage 4 (severe): Secondary | ICD-10-CM | POA: Diagnosis present

## 2022-02-22 DIAGNOSIS — N189 Chronic kidney disease, unspecified: Secondary | ICD-10-CM | POA: Diagnosis present

## 2022-02-22 DIAGNOSIS — D696 Thrombocytopenia, unspecified: Secondary | ICD-10-CM | POA: Diagnosis present

## 2022-02-22 DIAGNOSIS — E785 Hyperlipidemia, unspecified: Secondary | ICD-10-CM | POA: Diagnosis present

## 2022-02-22 DIAGNOSIS — R350 Frequency of micturition: Secondary | ICD-10-CM | POA: Diagnosis not present

## 2022-02-22 DIAGNOSIS — E860 Dehydration: Secondary | ICD-10-CM | POA: Diagnosis present

## 2022-02-22 DIAGNOSIS — I255 Ischemic cardiomyopathy: Secondary | ICD-10-CM | POA: Diagnosis present

## 2022-02-22 DIAGNOSIS — R42 Dizziness and giddiness: Secondary | ICD-10-CM | POA: Diagnosis not present

## 2022-02-22 DIAGNOSIS — F17210 Nicotine dependence, cigarettes, uncomplicated: Secondary | ICD-10-CM | POA: Diagnosis present

## 2022-02-22 DIAGNOSIS — J9601 Acute respiratory failure with hypoxia: Secondary | ICD-10-CM | POA: Diagnosis not present

## 2022-02-22 DIAGNOSIS — E871 Hypo-osmolality and hyponatremia: Secondary | ICD-10-CM | POA: Diagnosis present

## 2022-02-22 DIAGNOSIS — I959 Hypotension, unspecified: Secondary | ICD-10-CM | POA: Diagnosis present

## 2022-02-22 DIAGNOSIS — I1310 Hypertensive heart and chronic kidney disease without heart failure, with stage 1 through stage 4 chronic kidney disease, or unspecified chronic kidney disease: Secondary | ICD-10-CM | POA: Diagnosis present

## 2022-02-22 DIAGNOSIS — R570 Cardiogenic shock: Secondary | ICD-10-CM | POA: Diagnosis not present

## 2022-02-22 DIAGNOSIS — I5043 Acute on chronic combined systolic (congestive) and diastolic (congestive) heart failure: Secondary | ICD-10-CM | POA: Diagnosis present

## 2022-02-22 DIAGNOSIS — Z5320 Procedure and treatment not carried out because of patient's decision for unspecified reasons: Secondary | ICD-10-CM | POA: Diagnosis present

## 2022-02-22 LAB — COMPREHENSIVE METABOLIC PANEL
ALT: 27 U/L (ref 0–44)
AST: 44 U/L — ABNORMAL HIGH (ref 15–41)
Albumin: 3 g/dL — ABNORMAL LOW (ref 3.5–5.0)
Alkaline Phosphatase: 55 U/L (ref 38–126)
Anion gap: 10 (ref 5–15)
BUN: 30 mg/dL — ABNORMAL HIGH (ref 8–23)
CO2: 18 mmol/L — ABNORMAL LOW (ref 22–32)
Calcium: 8.4 mg/dL — ABNORMAL LOW (ref 8.9–10.3)
Chloride: 101 mmol/L (ref 98–111)
Creatinine, Ser: 2.62 mg/dL — ABNORMAL HIGH (ref 0.61–1.24)
GFR, Estimated: 24 mL/min — ABNORMAL LOW (ref >60)
Glucose, Bld: 111 mg/dL — ABNORMAL HIGH (ref 70–99)
Potassium: 4.3 mmol/L (ref 3.5–5.1)
Sodium: 129 mmol/L — ABNORMAL LOW (ref 135–145)
Total Bilirubin: 0.9 mg/dL (ref 0.3–1.2)
Total Protein: 8.6 g/dL — ABNORMAL HIGH (ref 6.5–8.1)

## 2022-02-22 LAB — CBC
HCT: 24.5 % — ABNORMAL LOW (ref 39.0–52.0)
Hemoglobin: 8.8 g/dL — ABNORMAL LOW (ref 13.0–17.0)
MCH: 35.5 pg — ABNORMAL HIGH (ref 26.0–34.0)
MCHC: 35.9 g/dL (ref 30.0–36.0)
MCV: 98.8 fL (ref 80.0–100.0)
Platelets: 105 10*3/uL — ABNORMAL LOW (ref 150–400)
RBC: 2.48 MIL/uL — ABNORMAL LOW (ref 4.22–5.81)
RDW: 14.6 % (ref 11.5–15.5)
WBC: 4.4 10*3/uL (ref 4.0–10.5)
nRBC: 0 % (ref 0.0–0.2)

## 2022-02-22 LAB — CBG MONITORING, ED: Glucose-Capillary: 107 mg/dL — ABNORMAL HIGH (ref 70–99)

## 2022-02-22 MED ORDER — FOLIC ACID 1 MG PO TABS
1.0000 mg | ORAL_TABLET | Freq: Every day | ORAL | Status: DC
  Administered 2022-02-23 – 2022-02-24 (×2): 1 mg via ORAL
  Filled 2022-02-22 (×2): qty 1

## 2022-02-22 MED ORDER — ACETAMINOPHEN 650 MG RE SUPP: 650.0000 mg | Freq: Four times a day (QID) | RECTAL | Status: DC | PRN

## 2022-02-22 MED ORDER — SODIUM CHLORIDE 0.9 % IV BOLUS
500.0000 mL | Freq: Once | INTRAVENOUS | Status: AC
  Administered 2022-02-22: 500 mL via INTRAVENOUS

## 2022-02-22 MED ORDER — SODIUM CHLORIDE 0.9 % IV SOLN: INTRAVENOUS | Status: DC

## 2022-02-22 MED ORDER — ALLOPURINOL 100 MG PO TABS
100.0000 mg | ORAL_TABLET | Freq: Every day | ORAL | Status: DC
  Administered 2022-02-23 – 2022-02-24 (×2): 100 mg via ORAL
  Filled 2022-02-22 (×2): qty 1

## 2022-02-22 MED ORDER — MEGESTROL ACETATE 40 MG PO TABS
40.0000 mg | ORAL_TABLET | Freq: Two times a day (BID) | ORAL | Status: DC
  Administered 2022-02-23 – 2022-02-24 (×5): 40 mg via ORAL
  Filled 2022-02-22 (×9): qty 1

## 2022-02-22 MED ORDER — ATORVASTATIN CALCIUM 20 MG PO TABS
20.0000 mg | ORAL_TABLET | Freq: Every day | ORAL | Status: DC
  Administered 2022-02-23 – 2022-02-24 (×2): 20 mg via ORAL
  Filled 2022-02-22 (×2): qty 1

## 2022-02-22 MED ORDER — CHLORHEXIDINE GLUCONATE 0.12 % MT SOLN
5.0000 mL | Freq: Four times a day (QID) | OROMUCOSAL | Status: DC
  Administered 2022-02-22 – 2022-02-24 (×7): 5 mL via OROMUCOSAL
  Filled 2022-02-22 (×8): qty 15

## 2022-02-22 MED ORDER — AMOXICILLIN 250 MG PO CAPS
500.0000 mg | ORAL_CAPSULE | Freq: Three times a day (TID) | ORAL | Status: DC
  Administered 2022-02-23 (×2): 500 mg via ORAL
  Filled 2022-02-22 (×3): qty 2

## 2022-02-22 MED ORDER — MIDODRINE HCL 5 MG PO TABS
10.0000 mg | ORAL_TABLET | Freq: Three times a day (TID) | ORAL | Status: DC
  Administered 2022-02-23 – 2022-02-24 (×6): 10 mg via ORAL
  Filled 2022-02-22 (×6): qty 2

## 2022-02-22 MED ORDER — SENNOSIDES-DOCUSATE SODIUM 8.6-50 MG PO TABS
1.0000 | ORAL_TABLET | Freq: Every day | ORAL | Status: DC
  Administered 2022-02-23 – 2022-02-24 (×2): 1 via ORAL
  Filled 2022-02-22 (×2): qty 1

## 2022-02-22 MED ORDER — HEPARIN SODIUM (PORCINE) 5000 UNIT/ML IJ SOLN
5000.0000 [IU] | Freq: Three times a day (TID) | INTRAMUSCULAR | Status: DC
  Administered 2022-02-22 – 2022-02-23 (×2): 5000 [IU] via SUBCUTANEOUS
  Filled 2022-02-22 (×2): qty 1

## 2022-02-22 MED ORDER — NITROGLYCERIN 0.4 MG SL SUBL: 0.4000 mg | SUBLINGUAL_TABLET | SUBLINGUAL | Status: DC | PRN

## 2022-02-22 MED ORDER — ENSURE ENLIVE PO LIQD
237.0000 mL | Freq: Three times a day (TID) | ORAL | Status: DC
  Administered 2022-02-22 – 2022-02-24 (×4): 237 mL via ORAL
  Filled 2022-02-22 (×3): qty 237

## 2022-02-22 MED ORDER — ACETAMINOPHEN 325 MG PO TABS: 650.0000 mg | ORAL_TABLET | Freq: Four times a day (QID) | ORAL | Status: DC | PRN

## 2022-02-22 MED ORDER — VITAMIN B-12 100 MCG PO TABS
100.0000 ug | ORAL_TABLET | Freq: Every day | ORAL | Status: DC
Start: 2022-02-23 — End: 2022-02-25
  Administered 2022-02-23 – 2022-02-24 (×2): 100 ug via ORAL
  Filled 2022-02-22 (×4): qty 1

## 2022-02-22 MED ORDER — POTASSIUM CHLORIDE CRYS ER 10 MEQ PO TBCR
10.0000 meq | EXTENDED_RELEASE_TABLET | Freq: Every day | ORAL | Status: DC
  Administered 2022-02-23 – 2022-02-24 (×2): 10 meq via ORAL
  Filled 2022-02-22 (×2): qty 1

## 2022-02-22 MED ORDER — PANTOPRAZOLE SODIUM 40 MG PO TBEC
40.0000 mg | DELAYED_RELEASE_TABLET | Freq: Every day | ORAL | Status: DC
Start: 2022-02-23 — End: 2022-02-25
  Administered 2022-02-23 – 2022-02-24 (×2): 40 mg via ORAL
  Filled 2022-02-22 (×2): qty 1

## 2022-02-22 NOTE — ED Notes (Signed)
Sister updated on status.

## 2022-02-22 NOTE — ED Notes (Signed)
12ml noted on bladder scan

## 2022-02-22 NOTE — Progress Notes (Signed)
Due to patient's confusion and h/o dementia, t's wallet with $8.10 and ID card, insurance card, medicare card, MVP card, and pharmacy card given to Animal nutritionist for placement in security locker. (Contents verified by both this nurse and Animal nutritionist). Bag # [id number], tag placed in pt's shadow chart at nurses' station.

## 2022-02-22 NOTE — ED Triage Notes (Signed)
Per sister, pt has had decreased urinary output since Friday, decrease appetite and had 17 teeth pulled on yesterday, sent by PCP for possible dehydration and fluids.

## 2022-02-22 NOTE — H&P (Addendum)
TRH H&P   Patient Demographics:    [name], is a 76 y.o. male  MRN: [id number]   DOB - 1946/08/05  Admit Date - 02/18/2022  Outpatient Primary MD for the patient is Celene Squibb, MD  Referring MD/NP/PA: PA [place]  Patient coming from: home   Chief Complaint  Patient presents with   Urinary Retention      HPI:    [name]  is a 76 y.o. male,  male with history of lung cancer, liver cirrhosis, tobacco use, systolic CHF, EF less than 20%, dementia, patient with recent hospitalization secondary to AKI, failure to thrive and UTI, as well was noted for hypotension where he was discharged on midodrine . -Patient was brought by his sister today due to concerns of dehydration and decreased urine output, patient was at the dentist yesterday where he had 17 teeth pulled out, patient was sent by his primary care doctor for dehydration, patient with poor oral intake at baseline, he was recently discharged on Megace, patient denies any fever, chills, but he reports generalized weakness and fatigue . - in ED pressure was soft, 93/60 2-2.62, from baseline of 1.6, low sodium at 129, baseline anemia with hemoglobin of 8.8, low platelets at 105, given above findings [facility] hospitalist consulted to admit.  Review of systems:      A full 10 point Review of Systems was done, except as stated above, all other Review of Systems were negative.   With Past History of the following :    Past Medical History:  Diagnosis Date   CAD (coronary artery disease)    IMI in 5/07 with CTO mid cx, 60% LAD, 99% RCA-> BMS; mod. impaired LV function; EF:35-40% 2/10   Cardiomyopathy ([place])    Cirrhosis ([place])    History of excessive alcohol use   CKD (chronic kidney disease) stage 4, GFR 15-29 ml/min (HCC)    Dementia (HCC)    Gastroesophageal reflux disease    Hyperlipidemia    Non-small cell  carcinoma of left lung, stage 1 ([place]) 12/28/2021      Past Surgical History:  Procedure Laterality Date   BIOPSY  12/05/2018   Procedure: BIOPSY;  Surgeon: Rogene Houston, MD;  [facility];  Service: Endoscopy;;  gastric   BIOPSY  05/09/2021   Procedure: BIOPSY;  Surgeon: Harvel Quale, MD;  [facility];  Service: Gastroenterology;;   BRONCHIAL BIOPSY  12/19/2021   Procedure: BRONCHIAL BIOPSIES;  Surgeon: Garner Nash, DO;  Location: [place] [facility];  Service: Pulmonary;;   BRONCHIAL BRUSHINGS  12/19/2021   Procedure: BRONCHIAL BRUSHINGS;  Surgeon: Garner Nash, DO;  Location: [place];  Service: Pulmonary;;   BRONCHIAL NEEDLE ASPIRATION BIOPSY  12/19/2021   Procedure: BRONCHIAL NEEDLE ASPIRATION BIOPSIES;  Surgeon: Garner Nash, DO;  [facility];  Service: Pulmonary;;   COLONOSCOPY WITH PROPOFOL N/A  05/09/2021   Procedure: COLONOSCOPY WITH PROPOFOL;  Surgeon: Harvel Quale, MD;  [facility];  Service: Gastroenterology;  Laterality: N/A;  12:30   ESOPHAGOGASTRODUODENOSCOPY N/A 12/19/2016   Procedure: ESOPHAGOGASTRODUODENOSCOPY (EGD);  Surgeon: Rogene Houston, MD;  [facility];  Service: Endoscopy;  Laterality: N/A;   ESOPHAGOGASTRODUODENOSCOPY N/A 12/21/2016   Procedure: ESOPHAGOGASTRODUODENOSCOPY (EGD);  Surgeon: Danie Binder, MD;  [facility];  Service: Endoscopy;  Laterality: N/A;   ESOPHAGOGASTRODUODENOSCOPY (EGD) WITH PROPOFOL N/A 12/22/2016   Procedure: ESOPHAGOGASTRODUODENOSCOPY (EGD) WITH PROPOFOL;  Surgeon: Mauri Pole, MD;  Location: [place] [facility];  Service: Endoscopy;  Laterality: N/A;   ESOPHAGOGASTRODUODENOSCOPY (EGD) WITH PROPOFOL N/A 12/05/2018   Procedure: ESOPHAGOGASTRODUODENOSCOPY (EGD) WITH PROPOFOL;  Surgeon: Rogene Houston, MD;  [facility];  Service: Endoscopy;  Laterality: N/A;  1:35   ESOPHAGOGASTRODUODENOSCOPY (EGD) WITH PROPOFOL N/A 05/09/2021    Procedure: ESOPHAGOGASTRODUODENOSCOPY (EGD) WITH PROPOFOL;  Surgeon: Harvel Quale, MD;  [facility];  Service: Gastroenterology;  Laterality: N/A;   FIDUCIAL MARKER PLACEMENT  12/19/2021   Procedure: FIDUCIAL MARKER PLACEMENT;  Surgeon: Garner Nash, DO;  Location: [place] [facility];  Service: Pulmonary;;   POLYPECTOMY  05/09/2021   Procedure: POLYPECTOMY;  Surgeon: Montez Morita, Quillian Quince, MD;  [facility];  Service: Gastroenterology;;   VIDEO BRONCHOSCOPY WITH RADIAL ENDOBRONCHIAL ULTRASOUND  12/19/2021   Procedure: RADIAL ENDOBRONCHIAL ULTRASOUND;  Surgeon: Garner Nash, DO;  [facility]: [facility];  Service: Pulmonary;;      Social History:     Social History   Tobacco Use   Smoking status: Every Day    Packs/day: 0.50    Years: 60.00    Total pack years: 30.00    Types: Cigarettes   Smokeless tobacco: Never   Tobacco comments:    12 cigarettes smoked daily ARJ 12/26/21  Substance Use Topics   Alcohol use: Not Currently    Comment: per sister "he had one beer last week"        Family History :     Family History  Problem Relation Age of Onset   Other Brother       Home Medications:   Prior to Admission medications   Medication Sig Start Date End Date Taking? Authorizing Provider  allopurinol (ZYLOPRIM) 100 MG tablet Take 100 mg by mouth daily.    [provider]  amoxicillin (AMOXIL) 500 MG capsule Take 500 mg by mouth 3 (three) times daily. 02/21/22   [provider]  atorvastatin (LIPITOR) 20 MG tablet Take 20 mg by mouth daily. 10/11/21   [provider]  carvedilol (COREG) 3.125 MG tablet Take 1 tablet (3.125 mg total) by mouth 2 (two) times daily with a meal. 02/18/22   Tat, Shanon Brow, MD  chlorhexidine (PERIDEX) 0.12 % solution SMARTSIG:By Mouth 02/21/22   [provider]  folic acid (FOLVITE) 1 MG tablet Take 1 tablet (1 mg total) by mouth daily. 01/09/20   Roxan Hockey, MD  furosemide (LASIX)  40 MG tablet Take 40 mg by mouth daily.    [provider]  ibuprofen (ADVIL) 800 MG tablet Take by mouth. 02/21/22   [provider]  Magnesium Oxide 400 (240 Mg) MG TABS Take 1 tablet (400 mg total) by mouth daily. 01/09/20   Roxan Hockey, MD  megestrol (MEGACE) 40 MG tablet 40 mg 2 (two) times daily.    [provider]  midodrine (PROAMATINE) 10 MG tablet Take 1 tablet (10 mg total) by  mouth 3 (three) times daily with meals. 02/18/22   Orson Eva, MD  Multiple Vitamins-Minerals (MULTIVITAMIN WITH MINERALS) tablet Take 1 tablet by mouth daily. 01/09/20   Roxan Hockey, MD  nitroGLYCERIN (NITROSTAT) 0.4 MG SL tablet Place 0.4 mg under the tongue every 5 (five) minutes as needed for chest pain.      [provider]  pantoprazole (PROTONIX) 40 MG tablet TAKE 1 TABLET BY MOUTH ONCE DAILY BEFORE BREAKFAST. 11/09/21   Montez Morita, Quillian Quince, MD  potassium chloride (KLOR-CON) 10 MEQ tablet Take 1 tablet (10 mEq total) by mouth daily. 01/09/20   Roxan Hockey, MD  senna-docusate (SENOKOT-S) 8.6-50 MG tablet Take 1 tablet by mouth daily.    [provider]  vitamin B-12 (CYANOCOBALAMIN) 100 MCG tablet Take 100 mcg by mouth daily. 05/11/21   [provider]     Allergies:    No Known Allergies   Physical Exam:   Vitals  Blood pressure 102/66, pulse 97, temperature 97.7 F (36.5 C), temperature source Oral, resp. rate (!) 23, height 5\' 5"  (1.651 m), weight 51.9 kg, SpO2 99 %.   1. General elderly male, thin appearing, laying in bed no apparent distress  2.  Pleasant, appropriate answering most questions appropriately, but he has mild dementia, oriented x2  3. No F.N deficits, ALL C.Nerves Intact, Strength 5/5 all 4 extremities, Sensation intact all 4 extremities, Plantars down going.  4. Ears and Eyes appear Normal, Conjunctivae clear, PERRLA. Moist Oral Mucosa.  He is edentulous, no erythema or discharge noted at recent teeth excision  site  5. Supple Neck, No JVD, No cervical lymphadenopathy appriciated, No Carotid Bruits.  6. Symmetrical Chest wall movement, Good air movement bilaterally, CTAB.  7. RRR, No Gallops, Rubs or Murmurs, No Parasternal Heave.  8. Positive Bowel Sounds, Abdomen Soft, No tenderness, No organomegaly appriciated,No rebound -guarding or rigidity.  9.  No Cyanosis, Normal Skin Turgor, No Skin Rash or Bruise.  10. Good muscle tone,  joints appear normal , no effusions, Normal ROM.    Data Review:    CBC Recent Labs  Lab 02/21/2022 1604  WBC 4.4  HGB 8.8*  HCT 24.5*  PLT 105*  MCV 98.8  MCH 35.5*  MCHC 35.9  RDW 14.6   ------------------------------------------------------------------------------------------------------------------  Chemistries  Recent Labs  Lab 02/16/22 0451 02/17/22 0527 02/17/22 0616 03/02/2022 1608  NA 135 136  --  129*  K 4.4 4.7  --  4.3  CL 110 109  --  101  CO2 17* 20*  --  18*  GLUCOSE 73 81  --  111*  BUN 23 25*  --  30*  CREATININE 1.58* 1.65*  --  2.62*  CALCIUM 8.3* 8.8*  --  8.4*  MG  --   --  2.0  --   AST  --   --   --  44*  ALT  --   --   --  27  ALKPHOS  --   --   --  55  BILITOT  --   --   --  0.9   ------------------------------------------------------------------------------------------------------------------ estimated creatinine clearance is 17.3 mL/min (A) (by C-G formula based on SCr of 2.62 mg/dL (H)). ------------------------------------------------------------------------------------------------------------------ No results for input(s): "TSH", "T4TOTAL", "T3FREE", "THYROIDAB" in the last 72 hours.  Invalid input(s): "FREET3"  Coagulation profile No results for input(s): "INR", "PROTIME" in the last 168 hours. ------------------------------------------------------------------------------------------------------------------- No results for input(s): "DDIMER" in the last 72  hours. -------------------------------------------------------------------------------------------------------------------  Cardiac Enzymes No results for input(s): "  CKMB", "TROPONINI", "MYOGLOBIN" in the last 168 hours.  Invalid input(s): "CK" ------------------------------------------------------------------------------------------------------------------    Component Value Date/Time   BNP 1,603.0 (H) 01/07/2020 0602     ---------------------------------------------------------------------------------------------------------------  Urinalysis    Component Value Date/Time   COLORURINE YELLOW 02/14/2022 1836   APPEARANCEUR HAZY (A) 02/14/2022 1836   LABSPEC 1.006 02/14/2022 1836   PHURINE 5.0 02/14/2022 1836   GLUCOSEU NEGATIVE 02/14/2022 1836   HGBUR NEGATIVE 02/14/2022 1836   BILIRUBINUR NEGATIVE 02/14/2022 1836   KETONESUR NEGATIVE 02/14/2022 1836   PROTEINUR NEGATIVE 02/14/2022 1836   NITRITE NEGATIVE 02/14/2022 1836   LEUKOCYTESUR LARGE (A) 02/14/2022 1836    ----------------------------------------------------------------------------------------------------------------   Imaging Results:    No results found.  My personal review of EKG: Rhythm NSR, Rate  91 /min, QTc 502 , left bundle branch block which is old   Assessment & Plan:    Principal Problem:   AKI (acute kidney injury) ([place]) Active Problems:   Hypotension   Chronic kidney disease   Liver cirrhosis (HCC)   Unintentional weight loss   Malnutrition of moderate degree   Volume depletion    Acute kidney injury superimposed on chronic kidney disease (HCC) -Creatinine 1.61.8, elevated 2.6 on admission, volume depletion and dehydration in the setting of poor oral intake with recent dental extraction. -Continue with IV fluids, will use carefully given known EF 20% -Hold Lasix -Bladder scan with only 22 cc post void residual  Hyponatremia -Continue with IV normal saline   Hypotension -Continue  with Midodrin  -We will hold his beta-blockers for now, can be resumed when he is more stable .  Chronic systolic CHF  -During recent hospitalization his hydralazine, isosorbide and Aldactone has been discontinued due to soft blood pressure, . -We will resume Coreg when more stable . -Resume Lasix when more stable -History of cirrhosis, chronic systolic CHF with EF 12%, resumed Lasix 40 mg daily   Non-small cell carcinoma of left lung, stage 1 (HCC) -S/p radiation treatment, last session 01/30/2022   Cognitive impairment/dementia -Lives with his sister, continue with supportive care  Anemia of chronic kidney disease -hemoglobin at baseline  Recent teeth extraction -Has 17 teeth pulled yesterday, continue with amoxicillin, will keep on full liquid diet   Liver cirrhosis ([place]) -Appears stable, with mild thrombocytopenia, Aldactone has been discontinued recent hospitalization, will hold Lasix for now  Volume depletion/dehydration/malnutrition/failure to thrive -Continue with IV fluid, will consult PT and nutritionist      DVT Prophylaxis Heparin   AM Labs Ordered, also please review Full Orders  Family Communication: Admission, patients condition and plan of care including tests being ordered have been discussed with the patient(try to reach both sisters by phone with no answer) who indicate understanding and agree with the plan and Code Status.  Code Status DNR, has been during recent admissions  Likely DC to  home  Condition GUARDED    Consults called: none    Admission status: observation    Time spent in minutes : 70 minutes   Phillips Climes M.D on 03/05/2022 at 7:46 PM   [facility] - Office  [phone]

## 2022-02-22 NOTE — ED Provider Notes (Signed)
[facility] EMERGENCY DEPARTMENT Provider Note   CSN: [id number] Arrival date & time: 03/16/2022  1529     History  Chief Complaint  Patient presents with   Urinary Retention    [name] is a 76 y.o. male who presents the emergency department with concern for dehydration.  Per the patient's sister, she reports patient's had decreased urinary output for almost a week as well as decreased appetite.  He had 17 teeth pulled yesterday at the dentist.  He was sent by his primary care doctor today for possible dehydration.  HPI     Home Medications Prior to Admission medications   Medication Sig Start Date End Date Taking? Authorizing Provider  allopurinol (ZYLOPRIM) 100 MG tablet Take 100 mg by mouth daily.    [provider]  amoxicillin (AMOXIL) 500 MG capsule Take 500 mg by mouth 3 (three) times daily. 02/21/22   [provider]  atorvastatin (LIPITOR) 20 MG tablet Take 20 mg by mouth daily. 10/11/21   [provider]  carvedilol (COREG) 3.125 MG tablet Take 1 tablet (3.125 mg total) by mouth 2 (two) times daily with a meal. 02/18/22   Tat, Shanon Brow, MD  chlorhexidine (PERIDEX) 0.12 % solution SMARTSIG:By Mouth 02/21/22   [provider]  folic acid (FOLVITE) 1 MG tablet Take 1 tablet (1 mg total) by mouth daily. 01/09/20   Roxan Hockey, MD  furosemide (LASIX) 40 MG tablet Take 40 mg by mouth daily.    [provider]  ibuprofen (ADVIL) 800 MG tablet Take by mouth. 02/21/22   [provider]  Magnesium Oxide 400 (240 Mg) MG TABS Take 1 tablet (400 mg total) by mouth daily. 01/09/20   Roxan Hockey, MD  megestrol (MEGACE) 40 MG tablet 40 mg 2 (two) times daily.    [provider]  midodrine (PROAMATINE) 10 MG tablet Take 1 tablet (10 mg total) by mouth 3 (three) times daily with meals. 02/18/22   Orson Eva, MD  Multiple Vitamins-Minerals (MULTIVITAMIN WITH MINERALS) tablet Take 1 tablet by mouth daily. 01/09/20   Roxan Hockey, MD  nitroGLYCERIN (NITROSTAT) 0.4 MG SL tablet Place 0.4 mg under the tongue every 5 (five) minutes as needed for chest pain.      [provider]  pantoprazole (PROTONIX) 40 MG tablet TAKE 1 TABLET BY MOUTH ONCE DAILY BEFORE BREAKFAST. 11/09/21   Montez Morita, Quillian Quince, MD  potassium chloride (KLOR-CON) 10 MEQ tablet Take 1 tablet (10 mEq total) by mouth daily. 01/09/20   Roxan Hockey, MD  senna-docusate (SENOKOT-S) 8.6-50 MG tablet Take 1 tablet by mouth daily.    [provider]  vitamin B-12 (CYANOCOBALAMIN) 100 MCG tablet Take 100 mcg by mouth daily. 05/11/21   [provider]      Allergies    Patient has no known allergies.    Review of Systems   Review of Systems  Constitutional:  Positive for fatigue. Negative for chills and fever.  Respiratory:  Negative for shortness of breath.   Cardiovascular:  Negative for chest pain.  Gastrointestinal:  Negative for abdominal pain.  Genitourinary:  Positive for decreased urine volume.  All other systems reviewed and are negative.   Physical Exam Updated Vital Signs BP 102/67   Pulse (!) 103   Temp 97.7 F (36.5 C) (Oral)   Resp (!) 21   Ht 5\' 5"  (1.651 m)   Wt 51.9 kg   SpO2 96%   BMI 19.04 kg/m  Physical Exam Vitals and nursing  note reviewed.  Constitutional:      Appearance: Normal appearance. He is cachectic.  HENT:     Head: Normocephalic and atraumatic.     Mouth/Throat:     Lips: Pink.     Mouth: Mucous membranes are moist.     Dentition: No gingival swelling, dental abscesses or gum lesions.  Eyes:     Conjunctiva/sclera: Conjunctivae normal.  Cardiovascular:     Rate and Rhythm: Normal rate and regular rhythm.  Pulmonary:     Effort: Pulmonary effort is normal. No respiratory distress.     Breath sounds: Normal breath sounds.  Abdominal:     General: There is no distension.     Palpations: Abdomen is soft.     Tenderness: There is no abdominal tenderness.  Skin:     General: Skin is warm and dry.  Neurological:     General: No focal deficit present.     Mental Status: He is alert.     ED Results / Procedures / Treatments   Labs (all labs ordered are listed, but only abnormal results are displayed) Labs Reviewed  CBC - Abnormal; Notable for the following components:      Result Value   RBC 2.48 (*)    Hemoglobin 8.8 (*)    HCT 24.5 (*)    MCH 35.5 (*)    Platelets 105 (*)    All other components within normal limits  COMPREHENSIVE METABOLIC PANEL - Abnormal; Notable for the following components:   Sodium 129 (*)    CO2 18 (*)    Glucose, Bld 111 (*)    BUN 30 (*)    Creatinine, Ser 2.62 (*)    Calcium 8.4 (*)    Total Protein 8.6 (*)    Albumin 3.0 (*)    AST 44 (*)    GFR, Estimated 24 (*)    All other components within normal limits  CBG MONITORING, ED - Abnormal; Notable for the following components:   Glucose-Capillary 107 (*)    All other components within normal limits  URINALYSIS, ROUTINE W REFLEX MICROSCOPIC    EKG EKG Interpretation  Date/Time:  Thursday February 22 2022 16:28:49 EDT Ventricular Rate:  91 PR Interval:  176 QRS Duration: 152 QT Interval:  408 QTC Calculation: 502 R Axis:   98 Text Interpretation: Sinus rhythm Nonspecific intraventricular conduction delay Left bundle branch block similar to May 2023 Confirmed by Sherwood Gambler [id number]) on 02/28/2022 4:31:51 PM  Radiology No results found.  Procedures Procedures    Medications Ordered in ED Medications  sodium chloride 0.9 % bolus 500 mL (0 mLs Intravenous Stopped 03/16/2022 1742)  sodium chloride 0.9 % bolus 500 mL (0 mLs Intravenous Stopped 02/20/2022 1833)    ED Course/ Medical Decision Making/ A&P                           Medical Decision Making Amount and/or Complexity of Data Reviewed Labs: ordered.   This patient is a 76 y.o. male who presents to the ED for concern of poor PO intake, this involves an extensive number of treatment options, and  is a complaint that carries with it a high risk of complications and morbidity.   Past Medical History / Co-morbidities / Social History: Non-small cell carcinoma of left lung lung stage 1, liver cirrhosis, systolic CHF with EF less than 20% (last echo 11/2021), anemia of chronic disease, and cognitive impairment  Additional history: Chart reviewed. Pertinent  results include: Patient recently admitted to the hospital on 5/31 for failure to thrive found to have acute kidney injury and UTI, likely due to dehydration from poor p.o. intake.  Transition Care Management had contacted the patient's family member for follow-up, she reported he had decreased p.o. intake and thought it was due to his dental pain.  Physical Exam: Physical exam performed. The pertinent findings include: blood pressures on the soft side, appear to be consistent with his baseline.  Otherwise normal vital signs.  Abdomen soft, nontender.  Lab Tests: I ordered, and personally interpreted labs.  The pertinent results include:  Hemoglobin 8.8, compared to 9.1 one week ago. Hyponatremia 129. Creatinine 2.62, up from 1.65 one week ago.    Imaging Studies: I ordered imaging studies including bladder scan. Nursing staff notified me patient had approximately 20 mL urine in bladder, consistent with dehydration.   Cardiac Monitoring:  The patient was maintained on a cardiac monitor.  My attending physician Dr. Regenia Skeeter viewed and interpreted the cardiac monitored which showed an underlying rhythm of: sinus rhythm. I agree with this interpretation.   Medications: I ordered medication including IV fluids  for dehydration. I have reviewed the patients home medicines and have made adjustments as needed.  Consultations Obtained: I requested consultation with the hospitalist Dr Waldron Labs,  and discussed lab and imaging findings as well as pertinent plan - they recommend: medical admission   Disposition: After consideration of the  diagnostic results and the patients response to treatment, I feel that patient is requiring admission for acute kidney injury likely due to poor PO intake and failure to thrive.   Final Clinical Impression(s) / ED Diagnoses Final diagnoses:  Acute kidney injury ([place])  Failure to thrive in adult    Rx / DC Orders ED Discharge Orders     None      Portions of this report may have been transcribed using voice recognition software. Every effort was made to ensure accuracy; however, inadvertent computerized transcription errors may be present.    Estill Cotta 02/19/2022 Gertha Calkin, MD 03/12/2022 1859

## 2022-02-23 DIAGNOSIS — N184 Chronic kidney disease, stage 4 (severe): Secondary | ICD-10-CM

## 2022-02-23 DIAGNOSIS — E43 Unspecified severe protein-calorie malnutrition: Secondary | ICD-10-CM | POA: Diagnosis present

## 2022-02-23 DIAGNOSIS — D696 Thrombocytopenia, unspecified: Secondary | ICD-10-CM | POA: Diagnosis present

## 2022-02-23 DIAGNOSIS — F0390 Unspecified dementia without behavioral disturbance: Secondary | ICD-10-CM | POA: Diagnosis present

## 2022-02-23 DIAGNOSIS — R339 Retention of urine, unspecified: Secondary | ICD-10-CM | POA: Diagnosis present

## 2022-02-23 DIAGNOSIS — R570 Cardiogenic shock: Secondary | ICD-10-CM | POA: Diagnosis not present

## 2022-02-23 DIAGNOSIS — R634 Abnormal weight loss: Secondary | ICD-10-CM | POA: Diagnosis not present

## 2022-02-23 DIAGNOSIS — I5042 Chronic combined systolic (congestive) and diastolic (congestive) heart failure: Secondary | ICD-10-CM | POA: Diagnosis present

## 2022-02-23 DIAGNOSIS — I950 Idiopathic hypotension: Secondary | ICD-10-CM | POA: Diagnosis not present

## 2022-02-23 DIAGNOSIS — N179 Acute kidney failure, unspecified: Secondary | ICD-10-CM | POA: Diagnosis not present

## 2022-02-23 DIAGNOSIS — Z515 Encounter for palliative care: Secondary | ICD-10-CM | POA: Diagnosis not present

## 2022-02-23 DIAGNOSIS — R627 Adult failure to thrive: Secondary | ICD-10-CM | POA: Diagnosis not present

## 2022-02-23 DIAGNOSIS — I130 Hypertensive heart and chronic kidney disease with heart failure and stage 1 through stage 4 chronic kidney disease, or unspecified chronic kidney disease: Secondary | ICD-10-CM | POA: Diagnosis present

## 2022-02-23 DIAGNOSIS — D631 Anemia in chronic kidney disease: Secondary | ICD-10-CM | POA: Diagnosis present

## 2022-02-23 DIAGNOSIS — Z79899 Other long term (current) drug therapy: Secondary | ICD-10-CM | POA: Diagnosis not present

## 2022-02-23 DIAGNOSIS — C3492 Malignant neoplasm of unspecified part of left bronchus or lung: Secondary | ICD-10-CM | POA: Diagnosis present

## 2022-02-23 DIAGNOSIS — R0602 Shortness of breath: Secondary | ICD-10-CM | POA: Diagnosis not present

## 2022-02-23 DIAGNOSIS — K0889 Other specified disorders of teeth and supporting structures: Secondary | ICD-10-CM | POA: Diagnosis present

## 2022-02-23 DIAGNOSIS — E440 Moderate protein-calorie malnutrition: Secondary | ICD-10-CM | POA: Diagnosis not present

## 2022-02-23 DIAGNOSIS — Z66 Do not resuscitate: Secondary | ICD-10-CM | POA: Diagnosis present

## 2022-02-23 DIAGNOSIS — R64 Cachexia: Secondary | ICD-10-CM | POA: Diagnosis present

## 2022-02-23 DIAGNOSIS — Z681 Body mass index (BMI) 19 or less, adult: Secondary | ICD-10-CM | POA: Diagnosis not present

## 2022-02-23 DIAGNOSIS — K7460 Unspecified cirrhosis of liver: Secondary | ICD-10-CM | POA: Diagnosis not present

## 2022-02-23 DIAGNOSIS — E871 Hypo-osmolality and hyponatremia: Secondary | ICD-10-CM | POA: Diagnosis present

## 2022-02-23 DIAGNOSIS — I959 Hypotension, unspecified: Secondary | ICD-10-CM | POA: Diagnosis present

## 2022-02-23 DIAGNOSIS — J811 Chronic pulmonary edema: Secondary | ICD-10-CM | POA: Diagnosis not present

## 2022-02-23 DIAGNOSIS — I255 Ischemic cardiomyopathy: Secondary | ICD-10-CM | POA: Diagnosis present

## 2022-02-23 DIAGNOSIS — E860 Dehydration: Secondary | ICD-10-CM | POA: Diagnosis present

## 2022-02-23 DIAGNOSIS — E869 Volume depletion, unspecified: Secondary | ICD-10-CM | POA: Diagnosis not present

## 2022-02-23 DIAGNOSIS — J90 Pleural effusion, not elsewhere classified: Secondary | ICD-10-CM | POA: Diagnosis not present

## 2022-02-23 DIAGNOSIS — J9601 Acute respiratory failure with hypoxia: Secondary | ICD-10-CM | POA: Diagnosis not present

## 2022-02-23 LAB — CBC
HCT: 21.6 % — ABNORMAL LOW (ref 39.0–52.0)
Hemoglobin: 7.7 g/dL — ABNORMAL LOW (ref 13.0–17.0)
MCH: 34.7 pg — ABNORMAL HIGH (ref 26.0–34.0)
MCHC: 35.6 g/dL (ref 30.0–36.0)
MCV: 97.3 fL (ref 80.0–100.0)
Platelets: 91 10*3/uL — ABNORMAL LOW (ref 150–400)
RBC: 2.22 MIL/uL — ABNORMAL LOW (ref 4.22–5.81)
RDW: 14.5 % (ref 11.5–15.5)
WBC: 3.5 10*3/uL — ABNORMAL LOW (ref 4.0–10.5)
nRBC: 0 % (ref 0.0–0.2)

## 2022-02-23 LAB — BASIC METABOLIC PANEL
Anion gap: 7 (ref 5–15)
BUN: 29 mg/dL — ABNORMAL HIGH (ref 8–23)
CO2: 17 mmol/L — ABNORMAL LOW (ref 22–32)
Calcium: 8.3 mg/dL — ABNORMAL LOW (ref 8.9–10.3)
Chloride: 112 mmol/L — ABNORMAL HIGH (ref 98–111)
Creatinine, Ser: 1.96 mg/dL — ABNORMAL HIGH (ref 0.61–1.24)
GFR, Estimated: 35 mL/min — ABNORMAL LOW (ref >60)
Glucose, Bld: 81 mg/dL (ref 70–99)
Potassium: 4.1 mmol/L (ref 3.5–5.1)
Sodium: 136 mmol/L (ref 135–145)

## 2022-02-23 LAB — URINALYSIS, ROUTINE W REFLEX MICROSCOPIC
Bilirubin Urine: NEGATIVE
Glucose, UA: NEGATIVE mg/dL
Hgb urine dipstick: NEGATIVE
Ketones, ur: NEGATIVE mg/dL
Nitrite: NEGATIVE
Protein, ur: NEGATIVE mg/dL
Specific Gravity, Urine: 1.011 (ref 1.005–1.030)
WBC, UA: >50 WBC/hpf — ABNORMAL HIGH (ref 0–5)
pH: 5 (ref 5.0–8.0)

## 2022-02-23 MED ORDER — AMOXICILLIN 250 MG PO CAPS
500.0000 mg | ORAL_CAPSULE | Freq: Two times a day (BID) | ORAL | Status: DC
  Administered 2022-02-23 – 2022-02-24 (×3): 500 mg via ORAL
  Filled 2022-02-23 (×3): qty 2

## 2022-02-23 NOTE — Progress Notes (Addendum)
Initial Nutrition Assessment  DOCUMENTATION CODES:   Severe malnutrition in context of chronic illness  INTERVENTION:  Ensure Enlive po TID, each supplement provides 350 kcal and 20 grams of protein.   Magic cup with meals  Multivitamin daily  Assist with feeding /drinking as needed   Recommend ST evaluate cognition and swallow  NUTRITION DIAGNOSIS:   Severe Malnutrition related to chronic illness (lung cancer, cirrhosis) as evidenced by moderate fat depletion, severe fat depletion, severe muscle depletion, moderate muscle depletion.   GOAL:  Patient will meet greater than or equal to 90% of their needs  MONITOR:  Diet advancement, PO intake, Weight trends, Supplement acceptance  REASON FOR ASSESSMENT:   Consult Assessment of nutrition requirement/status  ASSESSMENT: Patient is a 76 yo male with hx of non small cell left lung carcinoma stage 1 (12/28/21), cirrhosis, dementia, CAD and GERD. Presents with urinary retention, AKI.    Patient is receiving full liquids. Patient lunch tray is here but he is making no effort to consume anything. Patient had 17 teeth pulled yesterday. No family bedside during RD visit.   RD offered- milk (he took 1 sip) and said that is enough. Offered tea -refused. Ensure is here ~50% consumed. Unsure how advanced his dementia is. Recommend ST evaluation -cognition and swallow.   Medications: folic acid, allopurinol, Lipitor, Megace, protonix, Klor-con, B-12, Senna-docusate.  Labs:    Latest Ref Rng & Units 02/23/2022    4:35 AM 03/12/2022    4:08 PM 02/17/2022    5:27 AM  BMP  Glucose 70 - 99 mg/dL 81  111  81   BUN 8 - 23 mg/dL 29  30  25    Creatinine 0.61 - 1.24 mg/dL 1.96  2.62  1.65   Sodium 135 - 145 mmol/L 136  129  136   Potassium 3.5 - 5.1 mmol/L 4.1  4.3  4.7   Chloride 98 - 111 mmol/L 112  101  109   CO2 22 - 32 mmol/L 17  18  20    Calcium 8.9 - 10.3 mg/dL 8.3  8.4  8.8       NUTRITION - FOCUSED PHYSICAL EXAM:  Flowsheet Row  Most Recent Value  Orbital Region Moderate depletion  Upper Arm Region Severe depletion  Thoracic and Lumbar Region Moderate depletion  Buccal Region Severe depletion  Temple Region Moderate depletion  Clavicle Bone Region Severe depletion  Clavicle and Acromion Bone Region Severe depletion  Scapular Bone Region Moderate depletion  Dorsal Hand Mild depletion  Patellar Region Severe depletion  Anterior Thigh Region Severe depletion  Posterior Calf Region Severe depletion  Edema (RD Assessment) None  Hair Reviewed  Eyes Reviewed  Mouth Reviewed  [teeth pulled yesterday]  Skin Reviewed  Nails Reviewed       Diet Order:   Diet Order             DIET - DYS 1 Room service appropriate? Yes; Fluid consistency: Thin  Diet effective now                   EDUCATION NEEDS:  Not appropriate for education at this time (dementia- no family present)  Skin:  Skin Assessment: Reviewed RN Assessment  Last BM:  unknown  Height:   Ht Readings from Last 1 Encounters:  02/15/2022 5\' 7"  (1.702 m)    Weight:   Wt Readings from Last 1 Encounters:  03/13/2022 49.3 kg    Ideal Body Weight:   67 kg  BMI:  Body  mass index is 17.02 kg/m.  Estimated Nutritional Needs:   Kcal:  1800-2000  Protein:  70-80 gr  Fluid:  2 liters daily   Colman Cater MS,RD,CSG,LDN Office: [phone] Pager: [phone]

## 2022-02-23 NOTE — Evaluation (Signed)
Physical Therapy Evaluation Patient Details Name: [name] MRN: [id number] DOB: 19-Mar-1946 Today's Date: 02/23/2022  History of Present Illness  [name]  is a 76 y.o. male,  male with history of lung cancer, liver cirrhosis, tobacco use, systolic CHF, EF less than 20%, dementia, patient with recent hospitalization secondary to AKI, failure to thrive and UTI, as well was noted for hypotension where he was discharged on midodrine .  -Patient was brought by his sister today due to concerns of dehydration and decreased urine output, patient was at the dentist yesterday where he had 17 teeth pulled out, patient was sent by his primary care doctor for dehydration, patient with poor oral intake at baseline, he was recently discharged on Megace, patient denies any fever, chills, but he reports generalized weakness and fatigue .   Clinical Impression  Patient functioning near baseline for mobility and does not require assist for mobility. Min guard assist for safety and balance provided but patient does not have loss of balance. He transfers and ambulates without AD but slight unsteadiness with ambulating. Patient will benefit from continued skilled physical therapy in hospital and recommended venue below to increase strength, balance, endurance for safe ADLs and gait.        Recommendations for follow up therapy are one component of a multi-disciplinary discharge planning process, led by the attending physician.  Recommendations may be updated based on patient status, additional functional criteria and insurance authorization.  Follow Up Recommendations No PT follow up    Assistance Recommended at Discharge PRN  Patient can return home with the following  Assistance with cooking/housework    Equipment Recommendations None recommended by PT  Recommendations for Other Services       Functional Status Assessment Patient has had a recent decline in their functional status and  demonstrates the ability to make significant improvements in function in a reasonable and predictable amount of time.     Precautions / Restrictions Precautions Precautions: Fall Restrictions Weight Bearing Restrictions: No      Mobility  Bed Mobility Overal bed mobility: Independent                  Transfers Overall transfer level: Needs assistance Equipment used: None Transfers: Sit to/from Stand Sit to Stand: Min guard           General transfer comment: min g for safety/balance    Ambulation/Gait Ambulation/Gait assistance: Supervision, Min guard Gait Distance (Feet): 100 Feet Assistive device: None Gait Pattern/deviations: Decreased stride length       General Gait Details: slight unsteadiness intermittently  Stairs            Wheelchair Mobility    Modified Rankin (Stroke Patients Only)       Balance Overall balance assessment: Mild deficits observed, not formally tested                                           Pertinent Vitals/Pain Pain Assessment Pain Assessment: No/denies pain    Home Living Family/patient expects to be discharged to:: Private residence Living Arrangements: Other relatives Available Help at Discharge: Family;Available 24 hours/day Type of Home: House Home Access: Level entry       Home Layout: One level Home Equipment: Cane - single point      Prior Function Prior Level of Function : Independent/Modified Independent  Mobility Comments: Hydrographic surveyor without AD, does not drive ADLs Comments: can completed ADLs independently, does not drive- family assists as needed     Hand Dominance   Dominant Hand: Right    Extremity/Trunk Assessment   Upper Extremity Assessment Upper Extremity Assessment: Overall WFL for tasks assessed    Lower Extremity Assessment Lower Extremity Assessment: Overall WFL for tasks assessed    Cervical / Trunk Assessment Cervical /  Trunk Assessment: Normal  Communication   Communication: No difficulties  Cognition Arousal/Alertness: Awake/alert Behavior During Therapy: WFL for tasks assessed/performed Overall Cognitive Status: Within Functional Limits for tasks assessed                                          General Comments      Exercises     Assessment/Plan    PT Assessment Patient needs continued PT services  PT Problem List Decreased mobility;Decreased strength;Decreased balance;Decreased activity tolerance       PT Treatment Interventions DME instruction;Therapeutic activities;Gait training;Therapeutic exercise;Patient/family education;Stair training;Balance training;Functional mobility training;Neuromuscular re-education    PT Goals (Current goals can be found in the Care Plan section)  Acute Rehab PT Goals Patient Stated Goal: return home with family to assist PT Goal Formulation: With patient Time For Goal Achievement: 03/09/22 Potential to Achieve Goals: Good    Frequency Min 3X/week     Co-evaluation               AM-PAC PT "6 Clicks" Mobility  Outcome Measure Help needed turning from your back to your side while in a flat bed without using bedrails?: None Help needed moving from lying on your back to sitting on the side of a flat bed without using bedrails?: None Help needed moving to and from a bed to a chair (including a wheelchair)?: None Help needed standing up from a chair using your arms (e.g., wheelchair or bedside chair)?: None Help needed to walk in hospital room?: None Help needed climbing 3-5 steps with a railing? : A Little 6 Click Score: 23    End of Session   Activity Tolerance: Patient tolerated treatment well Patient left: in bed;with call bell/phone within reach Nurse Communication: Mobility status PT Visit Diagnosis: Unsteadiness on feet (R26.81);Other abnormalities of gait and mobility (R26.89);Muscle weakness (generalized) (M62.81)     Time: 4163-8453 PT Time Calculation (min) (ACUTE ONLY): 7 min   Charges:   PT Evaluation $PT Eval Low Complexity: 1 Low          9:13 AM, 02/23/22 Mearl Latin PT, DPT Physical Therapist at [facility]

## 2022-02-23 NOTE — Progress Notes (Signed)
Sent attending msg about pt refusing heprin injections. No new orders at this time

## 2022-02-23 NOTE — Hospital Course (Signed)
76 y.o. male,  male with history of nonsmall cell lung cancer, severe protein calorie malnutrition, liver cirrhosis, tobacco use, systolic CHF, EF less than 20%, dementia, DNR/DNI.  Patient had recent hospitalization secondary to AKI, failure to thrive and UTI, as well was noted for hypotension where he was discharged on midodrine with persistently low blood pressures. -Patient was brought by his sister today due to concerns of dehydration and decreased urine output, patient was at the dentist yesterday where he had 17 teeth pulled out, patient was sent by his primary care doctor for dehydration, patient with poor oral intake at baseline, he was recently discharged on Megace, patient denies any fever, chills, but he reports generalized weakness and fatigue . - in ED pressure was soft, 93/60 2-2.62, from baseline of 1.6, low sodium at 129, baseline anemia with hemoglobin of 8.8, low platelets at 105, given above findings [facility] hospitalist consulted to admit.  On 02/24/22 patient acutely decompensated while going to bathroom, he developed increasing respiratory distress and pulmonary edema, transferred to stepdown ICU, unfortunately refused bipap therapy after multiple attempts, given his respiratory distress was given concentrated oral morphine which seemed to help some.  He eventually developed progressive cardiogenic shock and after family meeting with his sister and brother was transitioned to full comfort measures and started on IV morphine infusion.

## 2022-02-23 NOTE — Care Management Obs Status (Signed)
[place] NOTIFICATION   Patient Details  Name: [name] MRN: [id number] Date of Birth: [birth year]   Medicare Observation Status Notification Given:       Boneta Lucks, RN 02/23/2022, 10:45 AM

## 2022-02-23 NOTE — Assessment & Plan Note (Signed)
--   dietitian consulted -- he is full comfort now and dying.  Anticipating hospital death.

## 2022-02-23 NOTE — Plan of Care (Signed)
  Problem: Acute Rehab PT Goals(only PT should resolve) Goal: Patient Will Transfer Sit To/From Stand Outcome: Progressing Flowsheets (Taken 02/23/2022 0914) Patient will transfer sit to/from stand: with modified independence Goal: Pt Will Transfer Bed To Chair/Chair To Bed Outcome: Progressing Flowsheets (Taken 02/23/2022 0914) Pt will Transfer Bed to Chair/Chair to Bed: with modified independence Goal: Pt Will Ambulate Outcome: Progressing Flowsheets (Taken 02/23/2022 0914) Pt will Ambulate:  > 125 feet  with modified independence  with least restrictive assistive device Goal: Pt/caregiver will Perform Home Exercise Program Outcome: Progressing Flowsheets (Taken 02/23/2022 0914) Pt/caregiver will Perform Home Exercise Program:  For increased strengthening  For improved balance  Independently  9:15 AM, 02/23/22 Mearl Latin PT, DPT Physical Therapist at [facility]

## 2022-02-23 NOTE — TOC Transition Note (Signed)
Transition of Care [facility]) - CM/SW Discharge Note   Patient Details  Name: [name] MRN: [id number] Date of Birth: 1946-08-12  Transition of Care [facility] CM/SW Contact:  Boneta Lucks, RN Phone Number: 02/23/2022, 1:38 PM   Clinical Narrative:   Patient admitted in OBS with acute Kidney injury. Patient lives with his sister. TOC consulted for home health. Patient states he does not need at this time. DC planning for tomorrow.   Final next level of care: Home/Self Care Barriers to Discharge: Continued Medical Work up   Patient Goals and CMS Choice Patient states their goals for this hospitalization and ongoing recovery are:: to go home CMS Medicare.gov Compare Post Acute Care list provided to:: Patient Choice offered to / list presented to : Patient       Readmission Risk Interventions    01/09/2020   11:00 AM 06/11/2019   11:41 AM  Readmission Risk Prevention Plan  Transportation Screening Complete Complete  PCP or Specialist Appt within 3-5 Days Not Complete Complete  Not Complete comments Patient discharging on weekend   [place] or [place] Complete Complete  Social Work Consult for [place] Planning/Counseling Complete Not Complete  Palliative Care Screening Complete   Medication Review Press photographer) Complete Complete

## 2022-02-23 NOTE — Evaluation (Signed)
Clinical/Bedside Swallow Evaluation Patient Details  Name: [name] MRN: [id number] Date of Birth: Jul 09, 1946  Today's Date: 02/23/2022 Time: SLP Start Time (ACUTE ONLY): 63 SLP Stop Time (ACUTE ONLY): 1400 SLP Time Calculation (min) (ACUTE ONLY): 27 min  Past Medical History:  Past Medical History:  Diagnosis Date   CAD (coronary artery disease)    IMI in 5/07 with CTO mid cx, 60% LAD, 99% RCA-> BMS; mod. impaired LV function; EF:35-40% 2/10   Cardiomyopathy ([place])    Cirrhosis ([place])    History of excessive alcohol use   CKD (chronic kidney disease) stage 4, GFR 15-29 ml/min (HCC)    Dementia ([place])    Gastroesophageal reflux disease    Hyperlipidemia    Non-small cell carcinoma of left lung, stage 1 ([place]) 12/28/2021   Past Surgical History:  Past Surgical History:  Procedure Laterality Date   BIOPSY  12/05/2018   Procedure: BIOPSY;  Surgeon: Rogene Houston, MD;  [facility];  Service: Endoscopy;;  gastric   BIOPSY  05/09/2021   Procedure: BIOPSY;  Surgeon: Harvel Quale, MD;  [facility];  Service: Gastroenterology;;   BRONCHIAL BIOPSY  12/19/2021   Procedure: BRONCHIAL BIOPSIES;  Surgeon: Garner Nash, DO;  Location: [facility];  Service: Pulmonary;;   BRONCHIAL BRUSHINGS  12/19/2021   Procedure: BRONCHIAL BRUSHINGS;  Surgeon: Garner Nash, DO;  Location: [facility];  Service: Pulmonary;;   BRONCHIAL NEEDLE ASPIRATION BIOPSY  12/19/2021   Procedure: BRONCHIAL NEEDLE ASPIRATION BIOPSIES;  Surgeon: Garner Nash, DO;  Location: [facility];  Service: Pulmonary;;   COLONOSCOPY WITH PROPOFOL N/A 05/09/2021   Procedure: COLONOSCOPY WITH PROPOFOL;  Surgeon: Harvel Quale, MD;  [facility];  Service: Gastroenterology;  Laterality: N/A;  12:30   ESOPHAGOGASTRODUODENOSCOPY N/A 12/19/2016   Procedure: ESOPHAGOGASTRODUODENOSCOPY (EGD);  Surgeon: Rogene Houston, MD;  [facility];  Service: Endoscopy;   Laterality: N/A;   ESOPHAGOGASTRODUODENOSCOPY N/A 12/21/2016   Procedure: ESOPHAGOGASTRODUODENOSCOPY (EGD);  Surgeon: Danie Binder, MD;  [facility];  Service: Endoscopy;  Laterality: N/A;   ESOPHAGOGASTRODUODENOSCOPY (EGD) WITH PROPOFOL N/A 12/22/2016   Procedure: ESOPHAGOGASTRODUODENOSCOPY (EGD) WITH PROPOFOL;  Surgeon: Mauri Pole, MD;  Location: [facility];  Service: Endoscopy;  Laterality: N/A;   ESOPHAGOGASTRODUODENOSCOPY (EGD) WITH PROPOFOL N/A 12/05/2018   Procedure: ESOPHAGOGASTRODUODENOSCOPY (EGD) WITH PROPOFOL;  Surgeon: Rogene Houston, MD;  [facility];  Service: Endoscopy;  Laterality: N/A;  1:35   ESOPHAGOGASTRODUODENOSCOPY (EGD) WITH PROPOFOL N/A 05/09/2021   Procedure: ESOPHAGOGASTRODUODENOSCOPY (EGD) WITH PROPOFOL;  Surgeon: Harvel Quale, MD;  [facility];  Service: Gastroenterology;  Laterality: N/A;   FIDUCIAL MARKER PLACEMENT  12/19/2021   Procedure: FIDUCIAL MARKER PLACEMENT;  Surgeon: Garner Nash, DO;  Location: [facility];  Service: Pulmonary;;   POLYPECTOMY  05/09/2021   Procedure: POLYPECTOMY;  Surgeon: Montez Morita, Quillian Quince, MD;  [facility];  Service: Gastroenterology;;   VIDEO BRONCHOSCOPY WITH RADIAL ENDOBRONCHIAL ULTRASOUND  12/19/2021   Procedure: RADIAL ENDOBRONCHIAL ULTRASOUND;  Surgeon: Garner Nash, DO;  [facility];  Service: Pulmonary;;   HPI:  76 y.o. male,  male with history of lung cancer, liver cirrhosis, tobacco use, systolic CHF, EF less than 20%, dementia, patient with recent hospitalization secondary to AKI, failure to thrive and UTI, as well was noted for hypotension where he was discharged on midodrine .  -Patient was brought by his sister today due to concerns of dehydration and decreased  urine output, patient was at the dentist yesterday where he had 17 teeth pulled out, patient was sent by his primary care doctor for dehydration, patient with poor oral intake at  baseline, he was recently discharged on Megace, patient denies any fever, chills, but he reports generalized weakness and fatigue .  - in ED pressure was soft, 93/60 2-2.62, from baseline of 1.6, low sodium at 129, baseline anemia with hemoglobin of 8.8, low platelets at 105, given above findings [facility] hospitalist consulted to admit.    Assessment / Plan / Recommendation  Clinical Impression  Clinical swallowing evaluation completed while Pt was sitting upright on the edge of the bed; Pt assessed with liquids - Pt is on a full liquid diet and just had 17 teeth pulled yesterday so is unable to masticate solids at this time. Pt consumed thin liquids without overt s/sx of oropharyngeal dysphagia. SLP reviewed universal aspiration precautions. Pt denies dysphagia. There are no further ST needs noted at this time. If/when solid diet is initiated while in acute care, if concerns for dysphagia present please re-order our service. Thank you, SLP Visit Diagnosis: Dysphagia, unspecified (R13.10)    Aspiration Risk       Diet Recommendation Thin liquid   Liquid Administration via: Cup;Straw Medication Administration: Whole meds with liquid Supervision: Patient able to self feed Compensations: Minimize environmental distractions;Slow rate;Small sips/bites    Other  Recommendations Oral Care Recommendations: Oral care BID    Recommendations for follow up therapy are one component of a multi-disciplinary discharge planning process, led by the attending physician.  Recommendations may be updated based on patient status, additional functional criteria and insurance authorization.  Follow up Recommendations No SLP follow up        [place] Date of Onset: 03/11/2022 HPI: 76 y.o. male,  male with history of lung cancer, liver cirrhosis, tobacco use, systolic CHF, EF less than 20%, dementia, patient with recent hospitalization secondary to AKI, failure to thrive and UTI, as well was noted for  hypotension where he was discharged on midodrine .  -Patient was brought by his sister today due to concerns of dehydration and decreased urine output, patient was at the dentist yesterday where he had 17 teeth pulled out, patient was sent by his primary care doctor for dehydration, patient with poor oral intake at baseline, he was recently discharged on Megace, patient denies any fever, chills, but he reports generalized weakness and fatigue .  - in ED pressure was soft, 93/60 2-2.62, from baseline of 1.6, low sodium at 129, baseline anemia with hemoglobin of 8.8, low platelets at 105, given above findings [facility] hospitalist consulted to admit. Type of Study: Bedside Swallow Evaluation Previous Swallow Assessment: none in chart Diet Prior to this Study: Thin liquids Temperature Spikes Noted: No Respiratory Status: Room air History of Recent Intubation: No Behavior/Cognition: Alert;Cooperative;Pleasant mood Oral Cavity Assessment: Within Functional Limits Oral Cavity - Dentition: Edentulous;Poor condition (just had 17 teeth pulled yesterday) Vision: Functional for self-feeding Self-Feeding Abilities: Able to feed self Patient Positioning: Upright in bed Baseline Vocal Quality: Normal Volitional Cough: Strong Volitional Swallow: Able to elicit    Oral/Motor/Sensory Function Overall Oral Motor/Sensory Function: Within functional limits   Ice Chips Ice chips: Within functional limits   Thin Liquid Thin Liquid: Within functional limits    Nectar Thick Nectar Thick Liquid: Not tested   Honey Thick Honey Thick Liquid: Not tested   Puree Puree: Not tested   Solid     Solid:  Not tested     Almetta Lovely. Roddie Mc, CCC-SLP Speech Language Pathologist  Wende Bushy 02/23/2022,2:01 PM

## 2022-02-23 NOTE — Progress Notes (Signed)
PROGRESS NOTE   [name]  NIO:[id number] DOB: 26-Dec-1945 DOA: 02/20/2022 PCP: Celene Squibb, MD   Chief Complaint  Patient presents with   Urinary Retention   Level of care: Med-Surg  Brief Admission History:  76 y.o. male,  male with history of lung cancer, liver cirrhosis, tobacco use, systolic CHF, EF less than 20%, dementia, patient with recent hospitalization secondary to AKI, failure to thrive and UTI, as well was noted for hypotension where he was discharged on midodrine . -Patient was brought by his sister today due to concerns of dehydration and decreased urine output, patient was at the dentist yesterday where he had 17 teeth pulled out, patient was sent by his primary care doctor for dehydration, patient with poor oral intake at baseline, he was recently discharged on Megace, patient denies any fever, chills, but he reports generalized weakness and fatigue . - in ED pressure was soft, 93/60 2-2.62, from baseline of 1.6, low sodium at 129, baseline anemia with hemoglobin of 8.8, low platelets at 105, given above findings [facility] hospitalist consulted to admit.   Assessment and Plan: Acute kidney injury superimposed on chronic kidney disease (HCC) -Creatinine 1.61.8, elevated 2.6 on admission, volume depletion and dehydration in the setting of poor oral intake with recent dental extraction. -Continue with IV fluids, will use carefully given known EF 20% -Hold Lasix temporarily while correcting volume depletion.  -Bladder scan with only 22 cc post void residual   Hyponatremia -Continue with IV normal saline   Hypotension -Continue with Midodrin  -We will hold his beta-blockers for now, can be resumed when he is more stable .   Chronic systolic CHF  -During recent hospitalization his hydralazine, isosorbide and Aldactone has been discontinued due to soft blood pressure. -We will resume Coreg when more stable. -Resume Lasix when more stable -History of cirrhosis, chronic  systolic CHF with EF 81%   Non-small cell carcinoma of left lung, stage 1 (HCC) -S/p radiation treatment, last session 01/30/2022   Cognitive impairment/dementia -Lives with his sister, continue with supportive care   Anemia of chronic kidney disease -hemoglobin at baseline   Recent teeth extraction -Has 17 teeth pulled yesterday, continue with amoxicillin, will keep on full liquid diet   Liver cirrhosis ([place]) -Appears stable, with mild thrombocytopenia, Aldactone has been discontinued recent hospitalization, will hold Lasix for now   Volume depletion/dehydration/malnutrition/failure to thrive -Continue with IV fluid, will consult PT and nutritionist  DVT prophylaxis: [place] heparin Code Status: DNR  Family Communication:  Disposition: Status is: Observation The patient remains OBS appropriate and will d/c before 2 midnights.   Consultants:   Procedures:   Antimicrobials:    Subjective: He says he is starting to feel better with the IV fluids, he is only able to eat small amounts since having all teeth removed recently.   Objective: Vitals:   03/09/2022 2115 02/26/2022 2140 02/23/22 0200 02/23/22 0556  BP: 103/69  (!) 96/58 108/68  Pulse: (!) 101  89 88  Resp: 19  18 19   Temp: 97.7 F (36.5 C)  97.7 F (36.5 C) 98.9 F (37.2 C)  TempSrc:   Oral Oral  SpO2: 100% 100% 100% 100%  Weight: 49.3 kg     Height: 5\' 7"  (1.702 m)       Intake/Output Summary (Last 24 hours) at 02/23/2022 1344 Last data filed at 02/23/2022 0914 Gross per 24 hour  Intake 1002.66 ml  Output 300 ml  Net 702.66 ml   [employer]  03/16/2022 1549 03/13/2022 2115  Weight: 51.9 kg 49.3 kg   Examination:  General exam: very frail, elderly emaciated appearing male, awake, alert, cooperative, Appears calm and comfortable  Respiratory system: Clear to auscultation. Respiratory effort normal. Cardiovascular system: normal S1 & S2 heard. No JVD, murmurs, rubs, gallops or clicks. No pedal  edema. Gastrointestinal system: Abdomen is nondistended, soft and nontender. No organomegaly or masses felt. Normal bowel sounds heard. Central nervous system: Alert and oriented. No focal neurological deficits. Extremities: Symmetric 5 x 5 power. Skin: No rashes, lesions or ulcers. Psychiatry: Judgement and insight appear normal. Mood & affect appropriate.   Data Reviewed: I have personally reviewed following labs and imaging studies  CBC: Recent Labs  Lab 03/16/2022 1604 02/23/22 0435  WBC 4.4 3.5*  HGB 8.8* 7.7*  HCT 24.5* 21.6*  MCV 98.8 97.3  PLT 105* 91*    Basic Metabolic Panel: Recent Labs  Lab 02/17/22 0527 02/17/22 0616 03/03/2022 1608 02/23/22 0435  NA 136  --  129* 136  K 4.7  --  4.3 4.1  CL 109  --  101 112*  CO2 20*  --  18* 17*  GLUCOSE 81  --  111* 81  BUN 25*  --  30* 29*  CREATININE 1.65*  --  2.62* 1.96*  CALCIUM 8.8*  --  8.4* 8.3*  MG  --  2.0  --   --     CBG: Recent Labs  Lab 02/15/2022 1623  GLUCAP 107*    Recent Results (from the past 240 hour(s))  Urine Culture     Status: Abnormal   Collection Time: 02/14/22  8:14 PM   Specimen: Urine, Clean Catch  Result Value Ref Range Status   Specimen Description   Final    URINE, CLEAN CATCH Performed at [facility], [address] [place], [place] [address]    Special Requests   Final    NONE Performed at [facility], [address], [place], [place] [address]    Culture (A)  Final    <10,000 COLONIES/mL INSIGNIFICANT GROWTH Performed at [facility] [address] [place], [place] [address]    Report Status 02/16/2022 FINAL  Final     Radiology Studies: No results found.  Scheduled Meds:  allopurinol  100 mg Oral Daily   amoxicillin  500 mg Oral TID   atorvastatin  20 mg Oral Daily   chlorhexidine  5 mL Mouth/Throat QID   feeding supplement  237 mL Oral TID BM   folic acid  1 mg Oral Daily   heparin  5,000 Units Subcutaneous Q8H   megestrol  40 mg Oral BID   midodrine   10 mg Oral TID WC   pantoprazole  40 mg Oral QAC breakfast   potassium chloride  10 mEq Oral Daily   senna-docusate  1 tablet Oral Daily   vitamin B-12  100 mcg Oral Daily   Continuous Infusions:  sodium chloride 50 mL/hr at 02/23/22 0914     LOS: 0 days   Time spent: 35 mins  Ardian Haberland Wynetta Emery, MD How to contact the [facility] Attending or Consulting provider [place] or covering provider during after hours [place], for this patient?  Check the care team in [facility] and look for a) attending/consulting TRH provider listed and b) the [facility] team listed Log into [email or web address] and use [place] universal password to access. If you do not have the password, please contact the hospital operator. Locate the [facility] provider  you are looking for under [facility] and page to a number that you can be directly reached. If you still have difficulty reaching the provider, please page the [facility] (Director on Call) for the Hospitalists listed on amion for assistance.  02/23/2022, 1:44 PM

## 2022-02-24 ENCOUNTER — Inpatient Hospital Stay (HOSPITAL_COMMUNITY): Payer: Medicare Other

## 2022-02-24 ENCOUNTER — Encounter (HOSPITAL_COMMUNITY): Payer: Self-pay | Admitting: Family Medicine

## 2022-02-24 DIAGNOSIS — R627 Adult failure to thrive: Secondary | ICD-10-CM | POA: Diagnosis not present

## 2022-02-24 DIAGNOSIS — N179 Acute kidney failure, unspecified: Secondary | ICD-10-CM | POA: Diagnosis not present

## 2022-02-24 DIAGNOSIS — I950 Idiopathic hypotension: Secondary | ICD-10-CM | POA: Diagnosis not present

## 2022-02-24 DIAGNOSIS — E43 Unspecified severe protein-calorie malnutrition: Secondary | ICD-10-CM

## 2022-02-24 DIAGNOSIS — E440 Moderate protein-calorie malnutrition: Secondary | ICD-10-CM | POA: Diagnosis not present

## 2022-02-24 LAB — RENAL FUNCTION PANEL
Albumin: 2.6 g/dL — ABNORMAL LOW (ref 3.5–5.0)
Anion gap: 6 (ref 5–15)
BUN: 26 mg/dL — ABNORMAL HIGH (ref 8–23)
CO2: 18 mmol/L — ABNORMAL LOW (ref 22–32)
Calcium: 8.4 mg/dL — ABNORMAL LOW (ref 8.9–10.3)
Chloride: 111 mmol/L (ref 98–111)
Creatinine, Ser: 1.81 mg/dL — ABNORMAL HIGH (ref 0.61–1.24)
GFR, Estimated: 38 mL/min — ABNORMAL LOW (ref >60)
Glucose, Bld: 81 mg/dL (ref 70–99)
Phosphorus: 3 mg/dL (ref 2.5–4.6)
Potassium: 4 mmol/L (ref 3.5–5.1)
Sodium: 135 mmol/L (ref 135–145)

## 2022-02-24 LAB — URINE CULTURE: Culture: <10000 — AB

## 2022-02-24 MED ORDER — FUROSEMIDE 10 MG/ML IJ SOLN
40.0000 mg | Freq: Once | INTRAMUSCULAR | Status: AC
  Administered 2022-02-24: 40 mg via INTRAVENOUS

## 2022-02-24 MED ORDER — MORPHINE SULFATE (CONCENTRATE) 10 MG/0.5ML PO SOLN
10.0000 mg | ORAL | Status: DC | PRN
  Administered 2022-02-24 – 2022-02-25 (×3): 10 mg via ORAL
  Filled 2022-02-24 (×3): qty 0.5

## 2022-02-24 MED ORDER — FUROSEMIDE 10 MG/ML IJ SOLN
30.0000 mg | Freq: Once | INTRAMUSCULAR | Status: AC
Start: 2022-02-24 — End: 2022-02-24
  Administered 2022-02-24: 30 mg via INTRAVENOUS
  Filled 2022-02-24: qty 4

## 2022-02-24 MED ORDER — FUROSEMIDE 10 MG/ML IJ SOLN
INTRAMUSCULAR | Status: AC
  Filled 2022-02-24: qty 4

## 2022-02-24 MED ORDER — LORAZEPAM 2 MG/ML IJ SOLN
0.5000 mg | Freq: Four times a day (QID) | INTRAMUSCULAR | Status: DC | PRN
  Administered 2022-02-24: 0.5 mg via INTRAVENOUS
  Filled 2022-02-24: qty 1

## 2022-02-24 MED ORDER — CHLORHEXIDINE GLUCONATE CLOTH 2 % EX PADS
6.0000 | MEDICATED_PAD | Freq: Every day | CUTANEOUS | Status: DC
  Administered 2022-02-24: 6 via TOPICAL

## 2022-02-24 MED ORDER — CARVEDILOL 3.125 MG PO TABS
3.1250 mg | ORAL_TABLET | Freq: Two times a day (BID) | ORAL | Status: DC
  Administered 2022-02-24: 3.125 mg via ORAL
  Filled 2022-02-24 (×2): qty 1

## 2022-02-24 MED ORDER — FOSFOMYCIN TROMETHAMINE 3 G PO PACK
3.0000 g | PACK | Freq: Once | ORAL | Status: AC
Start: 2022-02-24 — End: 2022-02-24
  Administered 2022-02-24: 3 g via ORAL
  Filled 2022-02-24: qty 3

## 2022-02-24 NOTE — Progress Notes (Signed)
02/24/2022 6:46 PM  I spoke with patient's sister at bedside and with increasing respiratory distress, patient not tolerating bipap well and wants it removed we decided to remove bipap per patient's wishes with the understanding that he could have worsening respiratory symptoms.  She was agreeable with me ordering morphine for him to have as needed for SOB severe dyspnea symptoms.  Also ordered lorazepam IV for anxiety and hopefully he will calm down enough for [place] to be able to try the bipap again.  I explained to sister that prognosis is extremely poor and he is at very high risk to go into cardiogenic shock.   She verbalized understanding.  Monitoring urine output closely.  Very minimal output so far.  Warming blanket applied.  Encouraged morphine as needed for SOB/dyspnea symptoms.    Murvin Natal, MD How to contact the [facility] Attending or Consulting provider [place] or covering provider during after hours [place], for this patient?  Check the care team in [facility] and look for a) attending/consulting TRH provider listed and b) the [facility] team listed Log into [email or web address] and use [employer] universal password to access. If you do not have the password, please contact the hospital operator. Locate the [facility] provider you are looking for under [facility] and page to a number that you can be directly reached. If you still have difficulty reaching the provider, please page the [facility] (Director on Call) for the Hospitalists listed on amion for assistance.

## 2022-02-24 NOTE — Progress Notes (Signed)
   02/24/22 1350  Assess: MEWS Score  Temp 98.2 F (36.8 C)  BP 112/78  MAP (mmHg) 88  Pulse Rate (!) 128  Resp 19  Assess: MEWS Score  MEWS Temp 0  MEWS Systolic 0  MEWS Pulse 2  MEWS RR 0  MEWS LOC 0  MEWS Score 2  MEWS Score Color Yellow  Assess: if the MEWS score is Yellow or Red  Were vital signs taken at a resting state? Yes  Focused Assessment Change from prior assessment (see assessment flowsheet)  Does the patient meet 2 or more of the SIRS criteria? No  MEWS guidelines implemented *See Row Information* Yes  Treat  Pain Scale 0-10  Pain Score 0  Notify: Provider  Provider Name/Title [facility]  Date Provider Notified 02/24/22  Time Provider Notified 1351  Assess: SIRS CRITERIA  SIRS Temperature  0  SIRS Pulse 1  SIRS Respirations  0  SIRS WBC 0  SIRS Score Sum  1   Patient was in the bathroom and felt weak and out of breath. Sister at bedside and concerned to take him home like this. MD notified, awaiting provider response.

## 2022-02-24 NOTE — Progress Notes (Addendum)
Tried patient with BiPAP , he fights and hits at staff . Increased oxygen to 8 liters trying to slow down breathing.

## 2022-02-24 NOTE — Progress Notes (Signed)
02/24/2022 4:36 PM  Called for rapid response event. Pt acutely SOB with tachypnea, tachycardia and hypothermia. CXR with findings of pulmonary edema.  IV lasix ordered.  Supplemental oxygen.  BP 111/81.  Start warming blanket, home coreg 3.125 mg BID with holding parameters,  hold fluids.  Sister was present at bedside and updated.  HOLD DC TODAY. Keep on floor for now but if MEWS does not improve would send to stepdown unit.    Critical care time: 40 mins   Murvin Natal, MD  How to contact the [facility] Attending or Consulting provider [place] or covering provider during after hours [place], for this patient?  Check the care team in [facility] and look for a) attending/consulting TRH provider listed and b) the [facility] team listed Log into [email or web address] and use [place] universal password to access. If you do not have the password, please contact the hospital operator. Locate the [facility] provider you are looking for under [facility] and page to a number that you can be directly reached. If you still have difficulty reaching the provider, please page the [facility] (Director on Call) for the Hospitalists listed on amion for assistance.

## 2022-02-24 NOTE — Discharge Instructions (Signed)

## 2022-02-24 NOTE — Progress Notes (Signed)
Patient refused mobility, Peridex and Ensure. He did take all other medications with out complications. No changes noted and patient slept comfortably this shift. Continued monitoring of patient.

## 2022-02-24 NOTE — Progress Notes (Signed)
No change in patients vitals or symptoms. Patient transferred to ICU per MD Wynetta Emery.

## 2022-02-24 NOTE — Discharge Summary (Signed)
Physician Discharge Summary  TARRIS DELBENE GMW:[id number] DOB: [birth year] DOA: 02/26/2022  PCP: Celene Squibb, MD  Admit date: 02/20/2022 Discharge date: 02/24/2022  Admitted From:  Home  Disposition:  Home   Recommendations for Outpatient Follow-up:  Follow up with PCP in 1 weeks Please obtain BMP in 1-2 weeks  Discharge Condition: Stable   CODE STATUS: DNR Diet: Soft Foods Only    Brief Hospitalization Summary: Please see all hospital notes, images, labs for full details of the hospitalization. 76 y.o. male,  male with history of lung cancer, liver cirrhosis, tobacco use, systolic CHF, EF less than 20%, dementia, patient with recent hospitalization secondary to AKI, failure to thrive and UTI, as well was noted for hypotension where he was discharged on midodrine . -Patient was brought by his sister today due to concerns of dehydration and decreased urine output, patient was at the dentist yesterday where he had 17 teeth pulled out, patient was sent by his primary care doctor for dehydration, patient with poor oral intake at baseline, he was recently discharged on Megace, patient denies any fever, chills, but he reports generalized weakness and fatigue . - in ED pressure was soft, 93/60 2-2.62, from baseline of 1.6, low sodium at 129, baseline anemia with hemoglobin of 8.8, low platelets at 105, given above findings [facility] hospitalist consulted to admit.  Hospital course Pt was admitted for dehydration with acute kidney injury secondary to poor oral intake after he had 17 teeth removed.  He was given IV fluid hydration and responded well to this therapy and now was able to eat and drink better.  He was evaluated by speech therapy as well.  Patient's remain on a soft foods diet and encourage oral intake.  We have asked him to hold Lasix and potassium for now as his oral intake remains low as he recovers from this oral surgery.  Discharge Diagnoses:  Principal Problem:   AKI (acute kidney  injury) ([place]) Active Problems:   Protein-calorie malnutrition, severe   Chronic kidney disease   Liver cirrhosis ([place])   Unintentional weight loss   Malnutrition of moderate degree   Hypotension   Volume depletion  Discharge Instructions:  Allergies as of 02/24/2022   No Known Allergies      Medication List     STOP taking these medications    carvedilol 3.125 MG tablet Commonly known as: COREG   furosemide 40 MG tablet Commonly known as: LASIX   ibuprofen 800 MG tablet Commonly known as: ADVIL   potassium chloride 10 MEQ tablet Commonly known as: KLOR-CON       TAKE these medications    allopurinol 100 MG tablet Commonly known as: ZYLOPRIM Take 100 mg by mouth daily.   amoxicillin 500 MG capsule Commonly known as: AMOXIL Take 500 mg by mouth 3 (three) times daily.   atorvastatin 20 MG tablet Commonly known as: LIPITOR Take 20 mg by mouth daily.   chlorhexidine 0.12 % solution Commonly known as: PERIDEX Use as directed 10 mLs in the mouth or throat 2 (two) times daily.   folic acid 1 MG tablet Commonly known as: FOLVITE Take 1 tablet (1 mg total) by mouth daily.   magnesium oxide 400 (240 Mg) MG tablet Commonly known as: MAG-OX Take 1 tablet (400 mg total) by mouth daily.   megestrol 40 MG tablet Commonly known as: MEGACE 40 mg 2 (two) times daily.   midodrine 10 MG tablet Commonly known as: PROAMATINE Take 1 tablet (10 mg total)  by mouth 3 (three) times daily with meals.   multivitamin with minerals tablet Take 1 tablet by mouth daily.   nitroGLYCERIN 0.4 MG SL tablet Commonly known as: NITROSTAT Place 0.4 mg under the tongue every 5 (five) minutes as needed for chest pain.   pantoprazole 40 MG tablet Commonly known as: PROTONIX TAKE 1 TABLET BY MOUTH ONCE DAILY BEFORE BREAKFAST. What changed: when to take this   senna-docusate 8.6-50 MG tablet Commonly known as: Senokot-S Take 1 tablet by mouth daily.   vitamin B-12 100 MCG  tablet Commonly known as: CYANOCOBALAMIN Take 100 mcg by mouth daily.        Follow-up Information     Celene Squibb, MD. Schedule an appointment as soon as possible for a visit in 1 week(s).   Specialty: Internal Medicine Why: Hospital Follow Up Contact information: [place] [facility] [address] [phone]         Satira Sark, MD .   Specialty: Cardiology Contact information: [place] [address] [phone]                No Known Allergies Allergies as of 02/24/2022   No Known Allergies      Medication List     STOP taking these medications    carvedilol 3.125 MG tablet Commonly known as: COREG   furosemide 40 MG tablet Commonly known as: LASIX   ibuprofen 800 MG tablet Commonly known as: ADVIL   potassium chloride 10 MEQ tablet Commonly known as: KLOR-CON       TAKE these medications    allopurinol 100 MG tablet Commonly known as: ZYLOPRIM Take 100 mg by mouth daily.   amoxicillin 500 MG capsule Commonly known as: AMOXIL Take 500 mg by mouth 3 (three) times daily.   atorvastatin 20 MG tablet Commonly known as: LIPITOR Take 20 mg by mouth daily.   chlorhexidine 0.12 % solution Commonly known as: PERIDEX Use as directed 10 mLs in the mouth or throat 2 (two) times daily.   folic acid 1 MG tablet Commonly known as: FOLVITE Take 1 tablet (1 mg total) by mouth daily.   magnesium oxide 400 (240 Mg) MG tablet Commonly known as: MAG-OX Take 1 tablet (400 mg total) by mouth daily.   megestrol 40 MG tablet Commonly known as: MEGACE 40 mg 2 (two) times daily.   midodrine 10 MG tablet Commonly known as: PROAMATINE Take 1 tablet (10 mg total) by mouth 3 (three) times daily with meals.   multivitamin with minerals tablet Take 1 tablet by mouth daily.   nitroGLYCERIN 0.4 MG SL tablet Commonly known as: NITROSTAT Place 0.4 mg under the tongue every 5 (five) minutes as needed for chest  pain.   pantoprazole 40 MG tablet Commonly known as: PROTONIX TAKE 1 TABLET BY MOUTH ONCE DAILY BEFORE BREAKFAST. What changed: when to take this   senna-docusate 8.6-50 MG tablet Commonly known as: Senokot-S Take 1 tablet by mouth daily.   vitamin B-12 100 MCG tablet Commonly known as: CYANOCOBALAMIN Take 100 mcg by mouth daily.        Procedures/Studies: CT Head Wo Contrast  Result Date: 02/14/2022 CLINICAL DATA:  TIA EXAM: CT HEAD WITHOUT CONTRAST TECHNIQUE: Contiguous axial images were obtained from the base of the skull through the vertex without intravenous contrast. RADIATION DOSE REDUCTION: This exam was performed according to the departmental dose-optimization program which includes automated exposure control, adjustment of the mA and/or kV according  to patient size and/or use of iterative reconstruction technique. COMPARISON:  12/18/2016 FINDINGS: Brain: No acute intracranial findings are seen in noncontrast CT brain. There are no signs of bleeding. Cortical sulci are prominent. There is decreased density in the periventricular white matter. Vascular: Unremarkable. Skull: Unremarkable. Sinuses/Orbits: Unremarkable. Other: None. IMPRESSION: No acute intracranial findings are seen in noncontrast CT brain. Atrophy. Small-vessel disease. Electronically Signed   By: Elmer Picker M.D.   On: 02/14/2022 16:57   DG Abdomen Acute W/Chest  Result Date: 02/14/2022 CLINICAL DATA:  Cough, weakness, loss of appetite EXAM: DG ABDOMEN ACUTE WITH 1 VIEW CHEST COMPARISON:  12/19/2021 FINDINGS: Supine and upright frontal views of the abdomen and pelvis as well as an upright frontal view of the chest are obtained. The cardiac silhouette is enlarged but stable. Continued bibasilar interstitial prominence unchanged since prior study. No airspace disease, effusion, or pneumothorax. Bowel gas pattern is unremarkable without obstruction or ileus. There are no masses or abnormal calcifications. No  free gas in the greater peritoneal sac. No acute bony abnormalities. IMPRESSION: 1. No bowel obstruction or ileus. 2. Chronic bibasilar pulmonary fibrosis.  No acute airspace disease. Electronically Signed   By: Randa Ngo M.D.   On: 02/14/2022 16:45     Subjective: Pt reports he is feeling better, no complaints.   Discharge Exam: Vitals:   02/23/22 2356 02/24/22 0535  BP: (!) 86/48 99/69  Pulse: 86 95  Resp:  17  Temp: 98.6 F (37 C) 98.2 F (36.8 C)  SpO2: 100% 100%   Vitals:   02/23/22 0556 02/23/22 1420 02/23/22 2356 02/24/22 0535  BP: 108/68 (!) 88/55 (!) 86/48 99/69  Pulse: 88 100 86 95  Resp: 19 17  17   Temp: 98.9 F (37.2 C) 98.2 F (36.8 C) 98.6 F (37 C) 98.2 F (36.8 C)  TempSrc: Oral Oral Oral Oral  SpO2: 100% 100% 100% 100%  Weight:      Height:       General exam: very frail, elderly emaciated appearing male, awake, alert, cooperative, Appears calm and comfortable  Respiratory system: Clear to auscultation. Respiratory effort normal. Cardiovascular system: normal S1 & S2 heard. No JVD, murmurs, rubs, gallops or clicks. No pedal edema. Gastrointestinal system: Abdomen is nondistended, soft and nontender. No organomegaly or masses felt. Normal bowel sounds heard. Central nervous system: Alert and oriented. No focal neurological deficits. Extremities: Symmetric 5 x 5 power. Skin: No rashes, lesions or ulcers. Psychiatry: Judgement and insight appear poor. Mood & affect appropriate.    The results of significant diagnostics from this hospitalization (including imaging, microbiology, ancillary and laboratory) are listed below for reference.     Microbiology: Recent Results (from the past 240 hour(s))  Urine Culture     Status: Abnormal   Collection Time: 02/14/22  8:14 PM   Specimen: Urine, Clean Catch  Result Value Ref Range Status   Specimen Description   Final    URINE, CLEAN CATCH Performed at [facility], [address] [place], [place]  [address]    Special Requests   Final    NONE Performed at [facility], [address] [place], [place] [address]    Culture (A)  Final    <10,000 COLONIES/mL INSIGNIFICANT GROWTH Performed at [facility] [address], [place], [place] [address]    Report Status 02/16/2022 FINAL  Final  Urine Culture     Status: Abnormal   Collection Time: 02/23/22  1:01 AM   Specimen: Urine, Clean Catch  Result Value Ref Range Status   Specimen Description   Final    URINE, CLEAN CATCH Performed at [facility], [address], [place], [place] [address]    Special Requests   Final    NONE Performed at [facility], [address], [place], [place] [address]    Culture (A)  Final    <10,000 COLONIES/mL INSIGNIFICANT GROWTH Performed at [facility] [address] [place], [place] [address]    Report Status 02/24/2022 FINAL  Final     Labs: BNP (last 3 results) No results for input(s): "BNP" in the last 8760 hours. Basic Metabolic Panel: Recent Labs  Lab 02/20/2022 1608 02/23/22 0435 02/24/22 0606  NA 129* 136 135  K 4.3 4.1 4.0  CL 101 112* 111  CO2 18* 17* 18*  GLUCOSE 111* 81 81  BUN 30* 29* 26*  CREATININE 2.62* 1.96* 1.81*  CALCIUM 8.4* 8.3* 8.4*  PHOS  --   --  3.0   Liver Function Tests: Recent Labs  Lab 03/04/2022 1608 02/24/22 0606  AST 44*  --   ALT 27  --   ALKPHOS 55  --   BILITOT 0.9  --   PROT 8.6*  --   ALBUMIN 3.0* 2.6*   No results for input(s): "LIPASE", "AMYLASE" in the last 168 hours. No results for input(s): "AMMONIA" in the last 168 hours. CBC: Recent Labs  Lab 03/09/2022 1604 02/23/22 0435  WBC 4.4 3.5*  HGB 8.8* 7.7*  HCT 24.5* 21.6*  MCV 98.8 97.3  PLT 105* 91*   Cardiac Enzymes: No results for input(s): "CKTOTAL", "CKMB", "CKMBINDEX", "TROPONINI" in the last 168 hours. BNP: Invalid input(s): "POCBNP" CBG: Recent Labs  Lab 03/11/2022 1623  GLUCAP 107*   D-Dimer No results for input(s): "DDIMER" in the last 72  hours. Hgb A1c No results for input(s): "HGBA1C" in the last 72 hours. Lipid Profile No results for input(s): "CHOL", "HDL", "LDLCALC", "TRIG", "CHOLHDL", "LDLDIRECT" in the last 72 hours. Thyroid function studies No results for input(s): "TSH", "T4TOTAL", "T3FREE", "THYROIDAB" in the last 72 hours.  Invalid input(s): "FREET3" Anemia work up No results for input(s): "VITAMINB12", "FOLATE", "FERRITIN", "TIBC", "IRON", "RETICCTPCT" in the last 72 hours. Urinalysis    Component Value Date/Time   COLORURINE YELLOW 02/23/2022 0101   APPEARANCEUR CLEAR 02/23/2022 0101   LABSPEC 1.011 02/23/2022 0101   PHURINE 5.0 02/23/2022 0101   GLUCOSEU NEGATIVE 02/23/2022 0101   HGBUR NEGATIVE 02/23/2022 0101   BILIRUBINUR NEGATIVE 02/23/2022 0101   KETONESUR NEGATIVE 02/23/2022 0101   PROTEINUR NEGATIVE 02/23/2022 0101   NITRITE NEGATIVE 02/23/2022 0101   LEUKOCYTESUR LARGE (A) 02/23/2022 0101   Sepsis Labs Recent Labs  Lab 02/20/2022 1604 02/23/22 0435  WBC 4.4 3.5*   Microbiology Recent Results (from the past 240 hour(s))  Urine Culture     Status: Abnormal   Collection Time: 02/14/22  8:14 PM   Specimen: Urine, Clean Catch  Result Value Ref Range Status   Specimen Description   Final    URINE, CLEAN CATCH Performed at [facility], [address], [place], [place] [address]    Special Requests   Final    NONE Performed at [facility], [address] [place], [place] [address]    Culture (A)  Final    <10,000 COLONIES/mL INSIGNIFICANT GROWTH Performed at [facility], [place] [address] [place], [place] [address]    Report Status 02/16/2022 FINAL  Final  Urine Culture     Status:  Abnormal   Collection Time: 02/23/22  1:01 AM   Specimen: Urine, Clean Catch  Result Value Ref Range Status   Specimen Description   Final    URINE, CLEAN CATCH Performed at [facility], [address] [place], [place] [address]    Special Requests   Final    NONE Performed at [facility], [address] [place], [place] [address]    Culture (A)  Final    <10,000 COLONIES/mL INSIGNIFICANT GROWTH Performed at [facility] [address], [place], [place] [address]    Report Status 02/24/2022 FINAL  Final   Time coordinating discharge: 36 mins  SIGNED:  Irwin Brakeman, MD  [facility] 02/24/2022, 11:21 AM How to contact the [facility] Attending or Consulting provider [place] or covering provider during after hours [place], for this patient?  Check the care team in [facility] and look for a) attending/consulting [facility] provider listed and b) the [facility] team listed Log into [email or web address] and use [place] universal password to access. If you do not have the password, please contact the hospital operator. Locate the [facility] provider you are looking for under [facility] and page to a number that you can be directly reached. If you still have difficulty reaching the provider, please page the [facility] (Director on Call) for the Hospitalists listed on amion for assistance.

## 2022-02-24 NOTE — Progress Notes (Signed)
Placed on NRB mask , patient will not keep on and still fights when mask is adjusted. Would benefit from BiPAP but will not wear.

## 2022-02-24 NOTE — Progress Notes (Signed)
Rapid response called on patient at approximally 1615. Another RN notified this nurse that pt's respirations were elevated. Respirations at the time reading 36. Pulses elevated as well at 123. Rapid called and MD at bedside along side ICU nurse. New orders initiated to give pt IV lasix and to start hypothermia protocol, pt also prescribed an beta blocker and chest x-Jenney Brester. Pt able to speak and verbalize that he is cold. Pt's sister beside throughout this incident.

## 2022-02-25 ENCOUNTER — Inpatient Hospital Stay (HOSPITAL_COMMUNITY): Payer: Medicare Other

## 2022-02-25 DIAGNOSIS — R570 Cardiogenic shock: Secondary | ICD-10-CM | POA: Diagnosis not present

## 2022-02-25 DIAGNOSIS — N179 Acute kidney failure, unspecified: Secondary | ICD-10-CM | POA: Diagnosis not present

## 2022-02-25 DIAGNOSIS — I1310 Hypertensive heart and chronic kidney disease without heart failure, with stage 1 through stage 4 chronic kidney disease, or unspecified chronic kidney disease: Secondary | ICD-10-CM | POA: Diagnosis present

## 2022-02-25 DIAGNOSIS — K7460 Unspecified cirrhosis of liver: Secondary | ICD-10-CM | POA: Diagnosis not present

## 2022-02-25 DIAGNOSIS — I950 Idiopathic hypotension: Secondary | ICD-10-CM | POA: Diagnosis not present

## 2022-02-25 DIAGNOSIS — N184 Chronic kidney disease, stage 4 (severe): Secondary | ICD-10-CM | POA: Diagnosis not present

## 2022-02-25 DIAGNOSIS — Z66 Do not resuscitate: Secondary | ICD-10-CM | POA: Diagnosis present

## 2022-02-25 LAB — MRSA NEXT GEN BY PCR, NASAL: MRSA by PCR Next Gen: NOT DETECTED

## 2022-02-25 MED ORDER — MORPHINE BOLUS VIA INFUSION: 2.0000 mg | INTRAVENOUS | Status: DC | PRN

## 2022-02-25 MED ORDER — GLYCOPYRROLATE 1 MG PO TABS: 1.0000 mg | ORAL_TABLET | ORAL | Status: DC | PRN

## 2022-02-25 MED ORDER — HALOPERIDOL LACTATE 2 MG/ML PO CONC: 0.5000 mg | ORAL | Status: DC | PRN

## 2022-02-25 MED ORDER — BIOTENE DRY MOUTH MT LIQD: 15.0000 mL | OROMUCOSAL | Status: DC | PRN

## 2022-02-25 MED ORDER — HALOPERIDOL 0.5 MG PO TABS: 0.5000 mg | ORAL_TABLET | ORAL | Status: DC | PRN

## 2022-02-25 MED ORDER — MORPHINE 100MG IN NS 100ML (1MG/ML) PREMIX INFUSION
2.0000 mg/h | INTRAVENOUS | Status: DC
  Administered 2022-02-25: 2 mg/h via INTRAVENOUS
  Filled 2022-02-25: qty 100

## 2022-02-25 MED ORDER — ONDANSETRON HCL 4 MG/2ML IJ SOLN: 4.0000 mg | Freq: Four times a day (QID) | INTRAMUSCULAR | Status: DC | PRN

## 2022-02-25 MED ORDER — ONDANSETRON 4 MG PO TBDP: 4.0000 mg | ORAL_TABLET | Freq: Four times a day (QID) | ORAL | Status: DC | PRN

## 2022-02-25 MED ORDER — LORAZEPAM 2 MG/ML IJ SOLN: 1.0000 mg | INTRAMUSCULAR | Status: DC | PRN

## 2022-02-25 MED ORDER — GLYCOPYRROLATE 0.2 MG/ML IJ SOLN: 0.2000 mg | INTRAMUSCULAR | Status: DC | PRN

## 2022-02-25 MED ORDER — ACETAMINOPHEN 325 MG PO TABS: 650.0000 mg | ORAL_TABLET | Freq: Four times a day (QID) | ORAL | Status: DC | PRN

## 2022-02-25 MED ORDER — ACETAMINOPHEN 650 MG RE SUPP: 650.0000 mg | Freq: Four times a day (QID) | RECTAL | Status: DC | PRN

## 2022-02-25 MED ORDER — DIPHENHYDRAMINE HCL 50 MG/ML IJ SOLN: 12.5000 mg | INTRAMUSCULAR | Status: DC | PRN

## 2022-02-25 MED ORDER — POLYVINYL ALCOHOL 1.4 % OP SOLN: 1.0000 [drp] | Freq: Four times a day (QID) | OPHTHALMIC | Status: DC | PRN

## 2022-02-25 MED ORDER — LORAZEPAM 2 MG/ML PO CONC
1.0000 mg | ORAL | Status: DC | PRN
Start: 2022-02-25 — End: 2022-02-25

## 2022-02-25 MED ORDER — LORAZEPAM 1 MG PO TABS: 1.0000 mg | ORAL_TABLET | ORAL | Status: DC | PRN

## 2022-02-25 MED ORDER — HALOPERIDOL LACTATE 5 MG/ML IJ SOLN: 0.5000 mg | INTRAMUSCULAR | Status: DC | PRN

## 2022-03-08 ENCOUNTER — Ambulatory Visit: Payer: Medicare Other | Admitting: Urology

## 2022-03-17 NOTE — Progress Notes (Signed)
Mr. Dennard was made comfort this morning with family agreement. Was placed on 2mg /hr morphine for comfort. Patient died at 34 with myself and Sherry Ruffing RN at bedside. Called Dr. Wynetta Emery and Sister to let her know. Family came to visit and I had forgotten that he had a wallet in [place]. Called sister back and left a message to contact security at hospital to retrieve wallet. Body preparation complete and body was transported to our morgue.

## 2022-03-17 NOTE — Assessment & Plan Note (Signed)
-  S/p radiation treatment, last session 01/30/2022

## 2022-03-17 NOTE — Assessment & Plan Note (Signed)
--   he is now in cardiogenic shock with cardiorenal syndrome and very minimal urine output -- transition to full comfort measures

## 2022-03-17 NOTE — Progress Notes (Signed)
Attempted several times to place nasal cannula or non rebreather mask and patient would not leave on. Avenues such as taping cannula on face failed. Called sister will see if she can help get corporation from patient.

## 2022-03-17 NOTE — Assessment & Plan Note (Signed)
--   pt now in cardiogenic shock and only very minimal urine output -- transitioned to full comfort measures after family meeting 6/11

## 2022-03-17 NOTE — Assessment & Plan Note (Signed)
--   multifactorial given severely deconditioned state -- he has severe protein calorie malnutrition

## 2022-03-17 NOTE — Progress Notes (Signed)
Patient continues to interfere with medical interventions: pulling oxygen off face, removing nasal cannula, removing oxygen sensor. Patient appears to be in respiratory distress and verbalizes understanding of the same. Morphine utilized for patient comfort, and seems to help patient relax.

## 2022-03-17 NOTE — Assessment & Plan Note (Signed)
---   full comfort measures initiated starting 2022/03/06

## 2022-03-17 NOTE — Progress Notes (Addendum)
Mar 11, 2022 9:58 AM  I called for a family meeting with patient's sister and brother this morning and I met with them at bedside.  We discussed that patient was refusing care, he repeatedly refused to allow bipap or high flow nasal cannula and repeatedly pulled off nasal cannula as well.  His respiratory status is worsening, he is hypotensive and is going into cardiogenic shock, he has cardiorenal syndrome and has had very minimal urine output, he is becoming obtunded and poorly responsive sternal rub.  He is tachypneic and appears to be in respiratory distress.  We discussed my recommendation to begin full comfort measures and they are in agreement. Sister asked if IV nutrition would make a difference and I explained to her that it would likely make him worse given that he is in cardiogenic shock.  She verbalized understanding.  Sister and brother are in agreement with full comfort measures. End of life order set has been initiated. Beside RN informed.  Anticipating hospital death.      Interdisciplinary Goals of Care Family Meeting   Date carried out: 2022-03-11  Location of the meeting: Bedside  Member's involved: Physician, Bedside Registered Nurse, and Family Member or next of kin  Durable Power of Attorney or acting medical decision maker: sister     Discussion: We discussed goals of care for [employer] .   Code status: Full DNR  Disposition: In-patient comfort care  Time spent for the meeting: Scraper, MD  2022-03-11, 1:40 PM    Murvin Natal, MD  How to contact the [facility] Attending or Consulting provider [place] or covering provider during after hours [place], for this patient?  Check the care team in [facility] and look for a) attending/consulting [facility] provider listed and b) the [facility] team listed Log into [email or web address] and use [place] universal password to access. If you do not have the password, please contact the hospital operator. Locate the [facility] provider  you are looking for under [facility] and page to a number that you can be directly reached. If you still have difficulty reaching the provider, please page the [facility] (Director on Call) for the Hospitalists listed on amion for assistance.

## 2022-03-17 NOTE — Death Summary Note (Signed)
DEATH SUMMARY   Patient Details  Name: CHRISTINO MCGLINCHEY MRN: [id number] DOB: 1946/02/07  Admission/Discharge Information   Admit Date:  2022/03/01  Date of Death: Date of Death: Mar 04, 2022  Time of Death: Time of Death: Dec 02, 1315  Length of Stay: 2  Referring Physician: Celene Squibb, MD   Reason(s) for Hospitalization  76 y.o. male with history of non-small cell lung cancer, liver cirrhosis, tobacco use,severe ischemic cardiomypathy systolic CHF, EF less than 20%, stage IV CKD, severe protein calorie malnutrition, dementia, patient with recent hospitalization secondary to AKI, failure to thrive and UTI, as well was noted for hypotension where he was discharged on midodrine . -Patient was brought by his sister today due to concerns of dehydration and decreased urine output, patient was at the dentist 1 day prior to admission where he had 17 teeth pulled out, patient was sent by his primary care doctor for dehydration, patient with poor oral intake at baseline, he was recently discharged on Megace, patient denies any fever, chills, but he reports generalized weakness and fatigue. - in ED pressure was soft, 93/60 2-2.62, from baseline of 1.6, low sodium at 129, baseline anemia with hemoglobin of 8.8, low platelets at 105, given above findings [facility] hospitalist consulted to admit.  Diagnoses  Preliminary cause of death:  Cardiogenic shock  Secondary Diagnoses (including complications and co-morbidities):  Principal Problem:   AKI (acute kidney injury) ([place]) Active Problems:   Protein-calorie malnutrition, severe   stage IV CKD    Liver cirrhosis (HCC)   Unintentional weight loss   Hypotension   Ischemic cardiomyopathy   Respiratory failure with hypoxia (HCC)   HFrEF/Acute on chronic combined systolic and diastolic CHF (congestive heart failure) (HCC)--EF 10 to 15% April 2021   Solitary pulmonary nodule on lung CT   Non-small cell carcinoma of left lung, stage 1 (HCC)   Failure to thrive in  adult   DNR (do not resuscitate)   Cardiorenal syndrome   Cardiogenic shock [facility])   Brief Hospital Course (including significant findings, care, treatment, and services provided and events leading to death)  Patient acutely decompensated on 02/24/22 while going to bathroom, he developed increasing respiratory distress and flash pulmonary edema, rapid response was called and he was transferred to stepdown ICU, unfortunately refused bipap therapy after multiple attempts,  he also refused to wear non rebreather mask and given his respiratory distress I discussed with his sister my recommendation to give concentrated oral morphine for his dyspnea symptoms which seemed to calm him down some.  He eventually developed full cardiogenic shock with hypotension, cardiorenal syndrome with very minimal urine output and increased respiratory distress and after family meeting with his sister and brother he was transitioned to full comfort measures and started on IV morphine infusion.  Pt expired peacefully on 03-04-2022 and was pronounced at 1317.    Pertinent Labs and Studies  Significant Diagnostic Studies DG CHEST PORT 1 VIEW  Result Date: 2022-03-04 CLINICAL DATA:  Pulmonary edema. EXAM: PORTABLE CHEST 1 VIEW COMPARISON:  02/24/2022 FINDINGS: A 511 hours. Rotated film. Cardiopericardial silhouette is at upper limits of normal for size. Interstitial markings are diffusely coarsened with chronic features. Bibasilar atelectasis or infiltrate noted with small bilateral pleural effusions, right greater than left. Telemetry leads overlie the chest. IMPRESSION: Bibasilar atelectasis or infiltrate with small bilateral pleural effusions, right greater than left. Electronically Signed   By: Misty Stanley M.D.   On: 04-Mar-2022 09:47   DG CHEST PORT 1 VIEW  Result Date: 02/24/2022  CLINICAL DATA:  Shortness of breath EXAM: PORTABLE CHEST 1 VIEW COMPARISON:  02/14/2022 FINDINGS: Transverse diameter of heart is increased.  Central pulmonary vessels are more prominent. Increased interstitial markings are seen in the parahilar regions and lower lung fields, more so on the right side. Bilateral pleural effusions are seen, more so on the right side. There is no pneumothorax. IMPRESSION: Cardiomegaly. Central pulmonary vessels are more prominent suggesting CHF. Increased interstitial markings in the parahilar regions and lower lung fields suggest pulmonary edema. Bilateral pleural effusions, more so on the right side. Electronically Signed   By: Elmer Picker M.D.   On: 02/24/2022 15:17   CT Head Wo Contrast  Result Date: 02/14/2022 CLINICAL DATA:  TIA EXAM: CT HEAD WITHOUT CONTRAST TECHNIQUE: Contiguous axial images were obtained from the base of the skull through the vertex without intravenous contrast. RADIATION DOSE REDUCTION: This exam was performed according to the departmental dose-optimization program which includes automated exposure control, adjustment of the mA and/or kV according to patient size and/or use of iterative reconstruction technique. COMPARISON:  12/18/2016 FINDINGS: Brain: No acute intracranial findings are seen in noncontrast CT brain. There are no signs of bleeding. Cortical sulci are prominent. There is decreased density in the periventricular white matter. Vascular: Unremarkable. Skull: Unremarkable. Sinuses/Orbits: Unremarkable. Other: None. IMPRESSION: No acute intracranial findings are seen in noncontrast CT brain. Atrophy. Small-vessel disease. Electronically Signed   By: Elmer Picker M.D.   On: 02/14/2022 16:57   DG Abdomen Acute W/Chest  Result Date: 02/14/2022 CLINICAL DATA:  Cough, weakness, loss of appetite EXAM: DG ABDOMEN ACUTE WITH 1 VIEW CHEST COMPARISON:  12/19/2021 FINDINGS: Supine and upright frontal views of the abdomen and pelvis as well as an upright frontal view of the chest are obtained. The cardiac silhouette is enlarged but stable. Continued bibasilar interstitial  prominence unchanged since prior study. No airspace disease, effusion, or pneumothorax. Bowel gas pattern is unremarkable without obstruction or ileus. There are no masses or abnormal calcifications. No free gas in the greater peritoneal sac. No acute bony abnormalities. IMPRESSION: 1. No bowel obstruction or ileus. 2. Chronic bibasilar pulmonary fibrosis.  No acute airspace disease. Electronically Signed   By: Randa Ngo M.D.   On: 02/14/2022 16:45    Microbiology Recent Results (from the past 240 hour(s))  Urine Culture     Status: Abnormal   Collection Time: 02/23/22  1:01 AM   Specimen: Urine, Clean Catch  Result Value Ref Range Status   Specimen Description   Final    URINE, CLEAN CATCH Performed at [facility], [address], [place], [place] [address]    Special Requests   Final    NONE Performed at [facility], [address], [place], [place] [address]    Culture (A)  Final    <10,000 COLONIES/mL INSIGNIFICANT GROWTH Performed at [facility], [place] [address], [place], [place] [address]    Report Status 02/24/2022 FINAL  Final  MRSA Next Gen by PCR, Nasal     Status: None   Collection Time: 02/24/22  5:45 PM   Specimen: Nasal Mucosa; Nasal Swab  Result Value Ref Range Status   MRSA by PCR Next Gen NOT DETECTED NOT DETECTED Final    Comment: (NOTE) The GeneXpert MRSA Assay (FDA approved for NASAL specimens only), is one component of a comprehensive MRSA colonization surveillance program. It is not intended to diagnose MRSA infection nor to guide or monitor treatment for MRSA infections. Test performance is not FDA approved  in patients less than 31 years old. Performed at [facility], [address], [place], [place] [address]     Lab Basic Metabolic Panel: Recent Labs  Lab 02/26/2022 1608 02/23/22 0435 02/24/22 0606  NA 129* 136 135  K 4.3 4.1 4.0  CL 101 112* 111  CO2 18* 17* 18*  GLUCOSE 111* 81 81  BUN 30* 29* 26*  CREATININE 2.62* 1.96* 1.81*   CALCIUM 8.4* 8.3* 8.4*  PHOS  --   --  3.0   Liver Function Tests: Recent Labs  Lab 03/10/2022 1608 02/24/22 0606  AST 44*  --   ALT 27  --   ALKPHOS 55  --   BILITOT 0.9  --   PROT 8.6*  --   ALBUMIN 3.0* 2.6*   No results for input(s): "LIPASE", "AMYLASE" in the last 168 hours. No results for input(s): "AMMONIA" in the last 168 hours. CBC: Recent Labs  Lab 03/14/2022 1604 02/23/22 0435  WBC 4.4 3.5*  HGB 8.8* 7.7*  HCT 24.5* 21.6*  MCV 98.8 97.3  PLT 105* 91*   Cardiac Enzymes: No results for input(s): "CKTOTAL", "CKMB", "CKMBINDEX", "TROPONINI" in the last 168 hours. Sepsis Labs: Recent Labs  Lab 03/15/2022 1604 02/23/22 0435  WBC 4.4 3.5*    Procedures/Operations   Zaia Carre MD 03-02-2022, 1:39 PM

## 2022-03-17 NOTE — Progress Notes (Signed)
PROGRESS NOTE   [name]  ZHG:[id number] DOB: 1946-01-18 DOA: 03/03/2022 PCP: Celene Squibb, MD   Chief Complaint  Patient presents with   Urinary Retention   Level of care: Med-Surg  Brief Admission History:  76 y.o. male,  male with history of lung cancer, liver cirrhosis, tobacco use, systolic CHF, EF less than 20%, dementia, DNR/DNI.  Patient had recent hospitalization secondary to AKI, failure to thrive and UTI, as well was noted for hypotension where he was discharged on midodrine with persistently low blood pressures. -Patient was brought by his sister today due to concerns of dehydration and decreased urine output, patient was at the dentist yesterday where he had 17 teeth pulled out, patient was sent by his primary care doctor for dehydration, patient with poor oral intake at baseline, he was recently discharged on Megace, patient denies any fever, chills, but he reports generalized weakness and fatigue . - in ED pressure was soft, 93/60 2-2.62, from baseline of 1.6, low sodium at 129, baseline anemia with hemoglobin of 8.8, low platelets at 105, given above findings [facility] hospitalist consulted to admit.  On 02/24/22 patient acutely decompensated while going to bathroom, he developed increasing respiratory distress and pulmonary edema, transferred to stepdown ICU, unfortunately refused bipap therapy after multiple attempts, given his respiratory distress was given concentrated oral morphine which seemed to help some.  He eventually developed progressive cardiogenic shock and after family meeting with his sister and brother was transitioned to full comfort measures and started on IV morphine infusion.    Assessment and Plan: Protein-calorie malnutrition, severe -- dietitian consulted -- he is full comfort now and dying.  Anticipating hospital death.   Cardiogenic shock ([place]) -- pt refusing aggressive interventions on multiple occasions and I do not expect that he can have any  meaningful recovery at this point.   -- after family meeting 6/11 decision made to make comfortable and started full comfort measures for patient   Cardiorenal syndrome -- pt now in cardiogenic shock and only very minimal urine output -- transitioned to full comfort measures after family meeting 6/11   Non-small cell carcinoma of left lung, stage 1 ([place]) -S/p radiation treatment, last session 01/30/2022  Ischemic cardiomyopathy -- his EF <20% and now in cardiogenic shock  Hypotension --- full comfort measures initiated starting 2022-03-25  Unintentional weight loss -- multifactorial given severely deconditioned state -- he has severe protein calorie malnutrition   stage IV CKD  -- he is now in cardiogenic shock with cardiorenal syndrome and very minimal urine output -- transition to full comfort measures   DVT prophylaxis: full comfort measures Code Status: DNR Family Communication: sister/brother 6/11 family conference Disposition: Status is: Inpatient Remains inpatient appropriate because: uncontrolled pain, IV pain medication   Consultants:   Procedures:   Antimicrobials:    Subjective: Pt appears terminally ill   Objective: Vitals:   2022/03/25 0726 25-Mar-2022 0800 25-Mar-2022 0900 Mar 25, 2022 1000  BP:  (!) 62/34 (!) 61/23 (!) 58/30  Pulse:   80 77  Resp:  (!) 29 (!) 33 (!) 37  Temp: (!) 97.3 F (36.3 C)     TempSrc: Axillary     SpO2:   100% (!) 80%  Weight:      Height:        Intake/Output Summary (Last 24 hours) at 2022/03/25 1202 Last data filed at 2022/03/25 0726 Gross per 24 hour  Intake 281.33 ml  Output --  Net 281.33 ml   [employer]  02/16/2022 2115 02/24/22 1746 12-Mar-2022 0416  Weight: 49.3 kg 50.4 kg 52.4 kg   Examination:  General exam: emaciated chronically ill appearing, appears terminally ill, family at bedside   Respiratory system: tachypneic, diffuse crackles and rales heard. Diminished BS LUL.  Cardiovascular system: normal S1 & S2 heard.  Tachycardic.  2+ JVD, 3/6 murmurs, No rubs, gallops or clicks.  Gastrointestinal system: Abdomen is nondistended, soft and nontender.  Central nervous system: somnolent, difficult to arouse.  Extremities: no gross lesions.  Skin: No rashes, lesions or ulcers. Psychiatry: UTD.   Data Reviewed: I have personally reviewed following labs and imaging studies  CBC: Recent Labs  Lab 03/06/2022 1604 02/23/22 0435  WBC 4.4 3.5*  HGB 8.8* 7.7*  HCT 24.5* 21.6*  MCV 98.8 97.3  PLT 105* 91*    Basic Metabolic Panel: Recent Labs  Lab 03/13/2022 1608 02/23/22 0435 02/24/22 0606  NA 129* 136 135  K 4.3 4.1 4.0  CL 101 112* 111  CO2 18* 17* 18*  GLUCOSE 111* 81 81  BUN 30* 29* 26*  CREATININE 2.62* 1.96* 1.81*  CALCIUM 8.4* 8.3* 8.4*  PHOS  --   --  3.0    CBG: Recent Labs  Lab 02/19/2022 1623  GLUCAP 107*    Recent Results (from the past 240 hour(s))  Urine Culture     Status: Abnormal   Collection Time: 02/23/22  1:01 AM   Specimen: Urine, Clean Catch  Result Value Ref Range Status   Specimen Description   Final    URINE, CLEAN CATCH Performed at [facility], [address], [place], [place] [address]    Special Requests   Final    NONE Performed at [facility], [address], [place], [place] [address]    Culture (A)  Final    <10,000 COLONIES/mL INSIGNIFICANT GROWTH Performed at [facility], [place] [address], [place], [place] [address]    Report Status 02/24/2022 FINAL  Final  MRSA Next Gen by PCR, Nasal     Status: None   Collection Time: 02/24/22  5:45 PM   Specimen: Nasal Mucosa; Nasal Swab  Result Value Ref Range Status   MRSA by PCR Next Gen NOT DETECTED NOT DETECTED Final    Comment: (NOTE) The GeneXpert MRSA Assay (FDA approved for NASAL specimens only), is one component of a comprehensive MRSA colonization surveillance program. It is not intended to diagnose MRSA infection nor to guide or monitor treatment for MRSA infections. Test performance is  not FDA approved in patients less than 34 years old. Performed at [facility], [address] [place], [place] [address]      Radiology Studies: DG CHEST PORT 1 VIEW  Result Date: 12-Mar-2022 CLINICAL DATA:  Pulmonary edema. EXAM: PORTABLE CHEST 1 VIEW COMPARISON:  02/24/2022 FINDINGS: A 511 hours. Rotated film. Cardiopericardial silhouette is at upper limits of normal for size. Interstitial markings are diffusely coarsened with chronic features. Bibasilar atelectasis or infiltrate noted with small bilateral pleural effusions, right greater than left. Telemetry leads overlie the chest. IMPRESSION: Bibasilar atelectasis or infiltrate with small bilateral pleural effusions, right greater than left. Electronically Signed   By: Misty Stanley M.D.   On: 12-Mar-2022 09:47   DG CHEST PORT 1 VIEW  Result Date: 02/24/2022 CLINICAL DATA:  Shortness of breath EXAM: PORTABLE CHEST 1 VIEW COMPARISON:  02/14/2022 FINDINGS: Transverse diameter of heart is increased. Central pulmonary vessels are more prominent. Increased interstitial markings are seen in the parahilar regions and lower lung  fields, more so on the right side. Bilateral pleural effusions are seen, more so on the right side. There is no pneumothorax. IMPRESSION: Cardiomegaly. Central pulmonary vessels are more prominent suggesting CHF. Increased interstitial markings in the parahilar regions and lower lung fields suggest pulmonary edema. Bilateral pleural effusions, more so on the right side. Electronically Signed   By: Elmer Picker M.D.   On: 02/24/2022 15:17    Scheduled Meds: Continuous Infusions:  morphine 2 mg/hr (03-04-22 1103)     LOS: 2 days   Time spent: 62 mins  Nechama Escutia Wynetta Emery, MD How to contact the [facility] Attending or Consulting provider [place] or covering provider during after hours [place], for this patient?  Check the care team in [facility] and look for a) attending/consulting [facility] provider listed and b) the [facility] team listed Log  into [email or web address] and use [facility]'s universal password to access. If you do not have the password, please contact the hospital operator. Locate the [facility] provider you are looking for under [facility] and page to a number that you can be directly reached. If you still have difficulty reaching the provider, please page the [facility] (Director on Call) for the Hospitalists listed on amion for assistance.  04-Mar-2022, 12:02 PM

## 2022-03-17 NOTE — Assessment & Plan Note (Signed)
--   pt refusing aggressive interventions on multiple occasions and I do not expect that he can have any meaningful recovery at this point.   -- after family meeting 6/11 decision made to make comfortable and started full comfort measures for patient

## 2022-03-17 NOTE — Assessment & Plan Note (Signed)
--   his EF <20% and now in cardiogenic shock

## 2022-03-17 DEATH — deceased

## 2022-03-19 ENCOUNTER — Ambulatory Visit: Payer: Medicare Other | Admitting: Cardiology

## 2022-05-07 NOTE — Progress Notes (Signed)
  Radiation Oncology         [phone]) [phone] ________________________________  Name: [name] MRN: [id number]  Date: 01/30/2022  DOB: Aug 26, 1946  End of Treatment Note  Diagnosis:   76 year old male with newly diagnosed Stage IA NSCLC, squamous cell carcinoma of the left lower lobe lung.       Indication for treatment:  Curative, Definitive SBRT       Radiation treatment dates:   5/9-5/16/23  Site/dose:   The target was treated to 54 Gy in 3 fractions of 18 Gy  Beams/energy:   The patient was treated using stereotactic body radiotherapy according to a 3D conformal radiotherapy plan.  Volumetric arc fields were employed to deliver 6 MV X-rays.  Image guidance was performed with per fraction cone beam CT prior to treatment under personal MD supervision.  Immobilization was achieved using BodyFix Pillow.  Narrative: The patient tolerated radiation treatment relatively well.     Plan: The patient has completed radiation treatment. The patient will return to radiation oncology clinic for routine followup in one month. I advised them to call or return sooner if they have any questions or concerns related to their recovery or treatment. ________________________________  Sheral Apley. Tammi Klippel, M.D.
